# Patient Record
Sex: Female | Born: 1950 | Race: White | Hispanic: No | Marital: Married | Smoking: Former smoker
Health system: Southern US, Community
[De-identification: ages and names within clinical notes are randomized; demographics above are authoritative.]

## PROBLEM LIST (undated history)

## (undated) ENCOUNTER — Emergency Department (HOSPITAL_BASED_OUTPATIENT_CLINIC_OR_DEPARTMENT_OTHER): Admission: EM | Payer: Medicare Other

## (undated) DIAGNOSIS — K760 Fatty (change of) liver, not elsewhere classified: Secondary | ICD-10-CM

## (undated) DIAGNOSIS — M810 Age-related osteoporosis without current pathological fracture: Secondary | ICD-10-CM

## (undated) DIAGNOSIS — F32A Depression, unspecified: Secondary | ICD-10-CM

## (undated) DIAGNOSIS — R911 Solitary pulmonary nodule: Secondary | ICD-10-CM

## (undated) DIAGNOSIS — G4700 Insomnia, unspecified: Secondary | ICD-10-CM

## (undated) DIAGNOSIS — E041 Nontoxic single thyroid nodule: Secondary | ICD-10-CM

## (undated) HISTORY — PX: COLONOSCOPY: SHX174

## (undated) HISTORY — PX: ANKLE SURGERY: SHX546

## (undated) HISTORY — PX: BREAST BIOPSY: SHX20

---

## 2014-12-06 LAB — LIPID PANEL
CHOLESTEROL: 168 mg/dL (ref 0–200)
HDL: 83 mg/dL — AB (ref 35–70)
LDL CALC: 75 mg/dL
Triglycerides: 51 mg/dL (ref 40–160)

## 2015-03-07 LAB — BASIC METABOLIC PANEL
Glucose: 103 mg/dL
Potassium: 3.9 mmol/L (ref 3.4–5.3)
Sodium: 141 mmol/L (ref 137–147)

## 2015-03-07 LAB — HEPATIC FUNCTION PANEL
ALT: 18 U/L (ref 7–35)
AST: 21 U/L (ref 13–35)

## 2015-03-07 LAB — CBC AND DIFFERENTIAL
HEMATOCRIT: 41 % (ref 36–46)
Hemoglobin: 13.5 g/dL (ref 12.0–16.0)
PLATELETS: 257 10*3/uL (ref 150–399)
WBC: 5.6 10^3/mL

## 2015-05-15 LAB — LIPID PANEL
CHOLESTEROL: 212 mg/dL — AB (ref 0–200)
HDL: 82 mg/dL — AB (ref 35–70)
TRIGLYCERIDES: 45 mg/dL (ref 40–160)

## 2016-10-06 ENCOUNTER — Encounter: Payer: Self-pay | Admitting: Family Medicine

## 2016-10-06 ENCOUNTER — Other Ambulatory Visit (HOSPITAL_COMMUNITY)
Admission: RE | Admit: 2016-10-06 | Discharge: 2016-10-06 | Disposition: A | Discharge status: Home / Self Care | Payer: Medicare Other | Source: Ambulatory Visit | Attending: Family Medicine | Admitting: Family Medicine

## 2016-10-06 ENCOUNTER — Ambulatory Visit (INDEPENDENT_AMBULATORY_CARE_PROVIDER_SITE_OTHER): Payer: Medicare Other | Admitting: Family Medicine

## 2016-10-06 VITALS — BP 116/72 | HR 73 | Temp 98.4°F | Ht 63.25 in | Wt 119.8 lb

## 2016-10-06 DIAGNOSIS — N760 Acute vaginitis: Secondary | ICD-10-CM | POA: Diagnosis not present

## 2016-10-06 DIAGNOSIS — Z136 Encounter for screening for cardiovascular disorders: Secondary | ICD-10-CM | POA: Insufficient documentation

## 2016-10-06 DIAGNOSIS — Z0000 Encounter for general adult medical examination without abnormal findings: Secondary | ICD-10-CM | POA: Diagnosis not present

## 2016-10-06 DIAGNOSIS — Z13220 Encounter for screening for lipoid disorders: Secondary | ICD-10-CM | POA: Diagnosis not present

## 2016-10-06 DIAGNOSIS — Z124 Encounter for screening for malignant neoplasm of cervix: Secondary | ICD-10-CM | POA: Diagnosis not present

## 2016-10-06 MED ORDER — FLUCONAZOLE 150 MG PO TABS: 150.0000 mg | ORAL_TABLET | Freq: Once | ORAL | 0 refills | Status: AC

## 2016-10-06 MED ORDER — METRONIDAZOLE 500 MG PO TABS: 500.0000 mg | ORAL_TABLET | Freq: Two times a day (BID) | ORAL | 0 refills | Status: DC

## 2016-10-06 MED ORDER — FLUCONAZOLE 150 MG PO TABS: 150.0000 mg | ORAL_TABLET | Freq: Once | ORAL | 0 refills | Status: DC

## 2016-10-06 MED ORDER — METRONIDAZOLE 500 MG PO TABS: 500.0000 mg | ORAL_TABLET | Freq: Two times a day (BID) | ORAL | 0 refills | Status: AC

## 2016-10-06 NOTE — Progress Notes (Addendum)
Evelyn Jordan is a 66 y.o. female is here to [facility].   History of Present Illness:   Water quality scientist, CMA, acting as scribe for Dr. Juleen China.  CC:  Patient comes in today to establish care.  States she believes she may have a yeast infection.  Would like to have Pap performed today as well.  Patient is taking no medications and states she is allergic to compazine.  Vaginal Discharge  The patient's primary symptoms include vaginal discharge. This is a new problem. The current episode started in the past 7 days. The problem has been gradually worsening. The patient is experiencing no pain. She is not pregnant. Pertinent negatives include no abdominal pain, back pain, chills, constipation, diarrhea, discolored urine, dysuria, fever, flank pain, frequency, headaches, hematuria, nausea, painful intercourse, rash, sore throat, urgency or vomiting. The vaginal discharge was clear and malodorous. There has been no bleeding. She has not been passing clots. She has not been passing tissue. She has tried nothing for the symptoms. She is sexually active.   Health Maintenance Due  Topic Date Due  . Hepatitis C Screening  [birth year]  . HIV Screening  08/05/1966  . TETANUS/TDAP  08/05/1970  . PAP SMEAR  08/05/1972  . DEXA SCAN  08/05/2016  . PNA vac Low Risk Adult (1 of 2 - PCV13) 08/05/2016  . MAMMOGRAM  08/13/2016   PMHx, SurgHx, SocialHx, Medications, and Allergies were reviewed in the Visit Navigator and updated as appropriate.  History reviewed. No pertinent past medical history. History reviewed. No pertinent surgical history. History reviewed. No pertinent family history. Social History  Substance Use Topics  . Smoking status: Former Research scientist (life sciences)  . Smokeless tobacco: Never Used     Comment: quit in 2011  . Alcohol use Yes     Comment: Rarely   Current Medications and Allergies:   Current Outpatient Prescriptions:  .  metroNIDAZOLE (FLAGYL) 500 MG tablet, Take 1 tablet (500 mg total)  by mouth 2 (two) times daily., Disp: 14 tablet, Rfl: 0  Allergies  Allergen Reactions  . Compazine [Prochlorperazine Edisylate]     Neck hyperextends   Review of Systems:   Review of Systems  Constitutional: Negative for chills and fever.  HENT: Negative for congestion, ear pain and sore throat.   Eyes: Negative for blurred vision.  Respiratory: Negative for cough.   Cardiovascular: Negative for chest pain and palpitations.  Gastrointestinal: Negative for abdominal pain, constipation, diarrhea, nausea and vomiting.  Genitourinary: Positive for vaginal discharge. Negative for dysuria, flank pain, frequency, hematuria and urgency.  Musculoskeletal: Negative for back pain and neck pain.  Skin: Negative for rash.  Neurological: Negative for loss of consciousness and headaches.  Psychiatric/Behavioral: Negative for depression. The patient is not nervous/anxious.    Vitals:   Vitals:   10/06/16 1438  BP: 116/72  Pulse: 73  Temp: 98.4 F (36.9 C)  TempSrc: Oral  SpO2: 96%  Weight: 119 lb 12.8 oz (54.3 kg)  Height: 5' 3.25" (1.607 m)     Body mass index is 21.05 kg/m.  Physical Exam:   Physical Exam  Constitutional: She is oriented to person, place, and time. She appears well-developed and well-nourished. No distress.  HENT:  Head: Normocephalic and atraumatic.  Right Ear: External ear normal.  Left Ear: External ear normal.  Nose: Nose normal.  Mouth/Throat: Oropharynx is clear and moist.  Eyes: EOM are normal. Pupils are equal, round, and reactive to light.  Neck: Normal range of motion. Neck supple.  Cardiovascular: Normal rate, regular rhythm, normal heart sounds and intact distal pulses.   Pulmonary/Chest: Effort normal.  Abdominal: Soft. Normal appearance and bowel sounds are normal. There is no hepatosplenomegaly, splenomegaly or hepatomegaly. No hernia. Hernia confirmed negative in the right inguinal area and confirmed negative in the left inguinal area.    Genitourinary: Rectal exam shows no external hemorrhoid. There is no rash, tenderness or lesion on the right labia. There is no rash, tenderness or lesion on the left labia. Uterus is not deviated and not enlarged. Cervix exhibits no motion tenderness, no discharge and no friability. Right adnexum displays no mass. Left adnexum displays no mass. No erythema or bleeding in the vagina. Vaginal discharge found.  Musculoskeletal: Normal range of motion.  Neurological: She is alert and oriented to person, place, and time.  Skin: Skin is warm. Capillary refill takes less than 2 seconds.  Psychiatric: She has a normal mood and affect. Her behavior is normal.  Nursing note and vitals reviewed.  Assessment and Plan:   Evelyn Jordan was seen today for establish care and yeast infection.  Diagnoses and all orders for this visit:  Encounter for annual physical exam -     Comprehensive metabolic panel; Future  Acute vaginitis -     fluconazole (DIFLUCAN) 150 MG tablet; Take 1 tablet (150 mg total) by mouth once. -     metroNIDAZOLE (FLAGYL) 500 MG tablet; Take 1 tablet (500 mg total) by mouth 2 (two) times daily.  Screening for cervical cancer -     Cytology - PAP  Screening for lipid disorders  Encounter for screening for cardiovascular disorders -     Lipid panel; Future   . Reviewed expectations re: course of current medical issues. . Discussed self-management of symptoms. . Outlined signs and symptoms indicating need for more acute intervention. . Patient verbalized understanding and all questions were answered. . See orders for this visit as documented in the electronic medical record. . Patient received an After Visit Summary.  CMA served as Education administrator during this visit. History, Physical, and Plan performed by medical provider. Documentation and orders reviewed and attested to. Briscoe Deutscher, D.O.  Briscoe Deutscher, [place], [facility] 10/07/2016  Follow-up: No Follow-up on  file.

## 2016-10-07 ENCOUNTER — Encounter: Payer: Self-pay | Admitting: Family Medicine

## 2016-10-08 NOTE — Addendum Note (Signed)
Addended by: Briscoe Deutscher R on: 10/08/2016 09:24 AM   Modules accepted: Level of Service

## 2016-10-08 NOTE — Progress Notes (Signed)
Thanks for catching this. I added the documentation. Changed to NP3.

## 2016-10-09 LAB — CYTOLOGY - PAP
Bacterial vaginitis: NEGATIVE
Candida vaginitis: NEGATIVE
Diagnosis: NEGATIVE
HPV: NOT DETECTED

## 2016-10-17 ENCOUNTER — Ambulatory Visit (INDEPENDENT_AMBULATORY_CARE_PROVIDER_SITE_OTHER): Payer: Medicare Other

## 2016-10-17 ENCOUNTER — Encounter: Payer: Self-pay | Admitting: Family Medicine

## 2016-10-17 ENCOUNTER — Ambulatory Visit (INDEPENDENT_AMBULATORY_CARE_PROVIDER_SITE_OTHER): Payer: Medicare Other | Admitting: Family Medicine

## 2016-10-17 ENCOUNTER — Telehealth: Payer: Self-pay | Admitting: Family Medicine

## 2016-10-17 ENCOUNTER — Other Ambulatory Visit (INDEPENDENT_AMBULATORY_CARE_PROVIDER_SITE_OTHER): Payer: Medicare Other

## 2016-10-17 VITALS — BP 124/76 | HR 67 | Temp 98.1°F | Ht 63.25 in | Wt 122.4 lb

## 2016-10-17 DIAGNOSIS — Z0000 Encounter for general adult medical examination without abnormal findings: Secondary | ICD-10-CM

## 2016-10-17 DIAGNOSIS — Z136 Encounter for screening for cardiovascular disorders: Secondary | ICD-10-CM | POA: Diagnosis not present

## 2016-10-17 DIAGNOSIS — M25562 Pain in left knee: Secondary | ICD-10-CM

## 2016-10-17 DIAGNOSIS — M25462 Effusion, left knee: Secondary | ICD-10-CM | POA: Diagnosis not present

## 2016-10-17 LAB — COMPREHENSIVE METABOLIC PANEL
ALT: 20 U/L (ref 0–35)
AST: 21 U/L (ref 0–37)
Albumin: 3.9 g/dL (ref 3.5–5.2)
Alkaline Phosphatase: 43 U/L (ref 39–117)
BUN: 17 mg/dL (ref 6–23)
CO2: 27 mEq/L (ref 19–32)
Calcium: 9 mg/dL (ref 8.4–10.5)
Chloride: 108 mEq/L (ref 96–112)
Creatinine, Ser: 0.89 mg/dL (ref 0.40–1.20)
GFR: 67.61 mL/min (ref >60.00)
Glucose, Bld: 114 mg/dL — ABNORMAL HIGH (ref 70–99)
Potassium: 3.9 mEq/L (ref 3.5–5.1)
Sodium: 141 mEq/L (ref 135–145)
Total Bilirubin: 0.5 mg/dL (ref 0.2–1.2)
Total Protein: 6.4 g/dL (ref 6.0–8.3)

## 2016-10-17 LAB — LIPID PANEL
Cholesterol: 196 mg/dL (ref 0–200)
HDL: 76 mg/dL (ref >39.00)
LDL Cholesterol: 106 mg/dL — ABNORMAL HIGH (ref 0–99)
NonHDL: 120.39
Total CHOL/HDL Ratio: 3
Triglycerides: 73 mg/dL (ref 0.0–149.0)
VLDL: 14.6 mg/dL (ref 0.0–40.0)

## 2016-10-17 NOTE — Progress Notes (Signed)
   Evelyn Jordan is a 66 y.o. female here for an acute visit.  History of Present Illness:   [name] CMA acting as scribe for Dr. Juleen China.   Knee Pain   The incident occurred more than 1 week ago. The incident occurred at the gym. There was no injury mechanism. The pain is present in the left knee. The quality of the pain is described as aching. The pain is mild. The pain has been intermittent since onset. Pertinent negatives include no inability to bear weight, loss of motion, loss of sensation, muscle weakness, numbness or tingling. The symptoms are aggravated by palpation. She has tried nothing for the symptoms.   PMHx, SurgHx, SocialHx, Medications, and Allergies were reviewed in the Visit Navigator and updated as appropriate.  Current Medications:  No current outpatient prescriptions on file.  Allergies  Allergen Reactions  . Compazine [Prochlorperazine Edisylate]     Neck hyperextends   Review of Systems:   Review of Systems  Constitutional: Negative for chills and fever.  HENT: Negative for congestion, ear pain, sinus pain and sore throat.   Eyes: Negative for blurred vision and double vision.  Respiratory: Negative for cough, shortness of breath and wheezing.   Cardiovascular: Negative for chest pain, palpitations and leg swelling.  Gastrointestinal: Negative for constipation, diarrhea, nausea and vomiting.  Genitourinary: Negative for dysuria.  Musculoskeletal: Positive for joint pain. Negative for back pain and neck pain.       Left knee.   Neurological: Negative for dizziness, tingling, weakness, numbness and headaches.  Psychiatric/Behavioral: Negative for depression, hallucinations and memory loss.    Vitals:   Vitals:   10/17/16 1034  BP: 124/76  Pulse: 67  Temp: 98.1 F (36.7 C)  TempSrc: Oral  SpO2: 96%  Weight: 122 lb 6.4 oz (55.5 kg)  Height: 5' 3.25" (1.607 m)     Body mass index is 21.51 kg/m.  Physical Exam:   Physical Exam    Constitutional: She appears well-nourished.  HENT:  Head: Normocephalic and atraumatic.  Eyes: EOM are normal. Pupils are equal, round, and reactive to light.  Neck: Normal range of motion. Neck supple.  Cardiovascular: Normal rate, regular rhythm, normal heart sounds and intact distal pulses.   Pulmonary/Chest: Effort normal.  Abdominal: Soft.  Musculoskeletal:       Legs: Skin: Skin is warm.  Psychiatric: She has a normal mood and affect. Her behavior is normal.  Nursing note and vitals reviewed.   Assessment and Plan:    [name] was seen today for knee pain.  Diagnoses and all orders for this visit:  Acute pain of left knee Comments: c/w tendonitis. RICE. To [facility] if not improving.  Orders: -     DG Knee AP/LAT W/Sunrise Left; Future    . Reviewed expectations re: course of current medical issues. . Discussed self-management of symptoms. . Outlined signs and symptoms indicating need for more acute intervention. . Patient verbalized understanding and all questions were answered. . See orders for this visit as documented in the electronic medical record. . Patient received an After-Visit Summary.  CMA served as Education administrator during this visit. History, Physical, and Plan performed by medical provider. Documentation and orders reviewed and attested to. Briscoe Deutscher, D.O.  Briscoe Deutscher, DO Family [facility] [place], [facility] 10/17/2016   No future appointments.

## 2016-10-17 NOTE — Telephone Encounter (Signed)
Patient called in stating LFT knee pain. Wanted to be seen ASAP.   Scheduled with Briscoe Deutscher this morning at 10:30am

## 2016-11-24 ENCOUNTER — Encounter: Payer: Self-pay | Admitting: Family Medicine

## 2016-11-24 ENCOUNTER — Telehealth: Payer: Self-pay | Admitting: Family Medicine

## 2016-11-24 ENCOUNTER — Ambulatory Visit (INDEPENDENT_AMBULATORY_CARE_PROVIDER_SITE_OTHER): Payer: Medicare Other | Admitting: Family Medicine

## 2016-11-24 VITALS — BP 128/76 | HR 72 | Temp 98.5°F | Ht 63.25 in | Wt 121.6 lb

## 2016-11-24 DIAGNOSIS — L989 Disorder of the skin and subcutaneous tissue, unspecified: Secondary | ICD-10-CM | POA: Diagnosis not present

## 2016-11-24 MED ORDER — DOXYCYCLINE HYCLATE 100 MG PO TABS: 100.0000 mg | ORAL_TABLET | Freq: Two times a day (BID) | ORAL | 0 refills | Status: DC

## 2016-11-24 MED ORDER — MUPIROCIN 2 % EX OINT: 1.0000 "application " | TOPICAL_OINTMENT | Freq: Two times a day (BID) | CUTANEOUS | 0 refills | Status: DC

## 2016-11-24 NOTE — Telephone Encounter (Signed)
error 

## 2016-11-24 NOTE — Progress Notes (Signed)
Evelyn Jordan is a 66 y.o. female here for an acute visit.  History of Present Illness:   Evelyn Jordan CMA acting as scribe for Dr. Juleen Jordan.  Rash  This is a new problem. The current episode started in the past 7 days. The problem is unchanged. The affected locations include the back. The rash is characterized by redness and itchiness. She was exposed to nothing. Pertinent negatives include no congestion, cough, diarrhea, fatigue, fever, joint pain, shortness of breath, sore throat or vomiting. Past treatments include nothing.   PMHx, SurgHx, SocialHx, Medications, and Allergies were reviewed in the Visit Navigator and updated as appropriate.  Current Medications:   Current Outpatient Prescriptions:  .  doxycycline (VIBRA-TABS) 100 MG tablet, Take 1 tablet (100 mg total) by mouth 2 (two) times daily., Disp: 20 tablet, Rfl: 0 .  mupirocin ointment (BACTROBAN) 2 %, Place 1 application into the nose 2 (two) times daily., Disp: 22 g, Rfl: 0  Allergies  Allergen Reactions  . Compazine [Prochlorperazine Edisylate]     Neck hyperextends   Review of Systems:   Review of Systems  Constitutional: Negative for chills, fatigue, fever and malaise/fatigue.  HENT: Negative for congestion, ear pain, sinus pain and sore throat.   Eyes: Negative for blurred vision and double vision.  Respiratory: Negative for cough, shortness of breath and wheezing.   Cardiovascular: Negative for chest pain, palpitations and leg swelling.  Gastrointestinal: Negative for abdominal pain, diarrhea, nausea and vomiting.  Musculoskeletal: Negative for back pain, joint pain and neck pain.  Skin: Positive for rash.  Neurological: Negative for dizziness and headaches.  Psychiatric/Behavioral: Negative for depression, hallucinations and memory loss.   Vitals:   Vitals:   11/24/16 1501  BP: 128/76  Pulse: 72  Temp: 98.5 F (36.9 C)  TempSrc: Oral  SpO2: 96%  Weight: 121 lb 9.6 oz (55.2 kg)  Height: 5' 3.25"  (1.607 m)     Body mass index is 21.37 kg/m.  Physical Exam:   Physical Exam  Constitutional: She appears well-developed and well-nourished. No distress.  HENT:  Head: Normocephalic and atraumatic.  Eyes: EOM are normal. Pupils are equal, round, and reactive to light.  Neck: Normal range of motion. Neck supple.  Cardiovascular: Normal rate and regular rhythm.   Pulmonary/Chest: Effort normal.  Abdominal: Soft.  Musculoskeletal:       Back:  1-2 mm diameter area, irritated, with central black scab. No surrounding erythema, induration, or streaking.   Skin: Skin is warm.  Psychiatric: She has a normal mood and affect. Her behavior is normal.  Nursing note and vitals reviewed.   Results for orders placed or performed in visit on 10/17/16  Comprehensive metabolic panel  Result Value Ref Range   Sodium 141 135 - 145 mEq/L   Potassium 3.9 3.5 - 5.1 mEq/L   Chloride 108 96 - 112 mEq/L   CO2 27 19 - 32 mEq/L   Glucose, Bld 114 (H) 70 - 99 mg/dL   BUN 17 6 - 23 mg/dL   Creatinine, Ser 0.89 0.40 - 1.20 mg/dL   Total Bilirubin 0.5 0.2 - 1.2 mg/dL   Alkaline Phosphatase 43 39 - 117 U/L   AST 21 0 - 37 U/L   ALT 20 0 - 35 U/L   Total Protein 6.4 6.0 - 8.3 g/dL   Albumin 3.9 3.5 - 5.2 g/dL   Calcium 9.0 8.4 - 10.5 mg/dL   GFR 67.61 >60.00 mL/min  Lipid panel  Result Value Ref Range  Cholesterol 196 0 - 200 mg/dL   Triglycerides 73.0 0.0 - 149.0 mg/dL   HDL 76.00 >39.00 mg/dL   VLDL 14.6 0.0 - 40.0 mg/dL   LDL Cholesterol 106 (H) 0 - 99 mg/dL   Total CHOL/HDL Ratio 3    NonHDL 120.39     Assessment and Plan:   Evelyn Jordan was seen today for mass.  Diagnoses and all orders for this visit:  Skin lesion of back Comments: Most c/w tick or bug bite. No red flags. Advised patient to try mupirocin first. If worsens or signs of RMSF, to start Doxycycline. Orders: -     doxycycline (VIBRA-TABS) 100 MG tablet; Take 1 tablet (100 mg total) by mouth 2 (two) times daily. -      mupirocin ointment (BACTROBAN) 2 %; Place 1 application into the area BID prn   . Reviewed expectations re: course of current medical issues. . Discussed self-management of symptoms. . Outlined signs and symptoms indicating need for more acute intervention. . Patient verbalized understanding and all questions were answered. Marland Kitchen Health Maintenance issues including appropriate healthy diet, exercise, and smoking avoidance were discussed with patient. . See orders for this visit as documented in the electronic medical record. . Patient received an After Visit Summary.  CMA served as Education administrator during this visit. History, Physical, and Plan performed by medical provider. The above documentation has been reviewed and is accurate and complete. Evelyn Jordan, D.O.  Evelyn Deutscher, DO [place], [facility] 11/25/2016  No future appointments.

## 2017-03-05 DIAGNOSIS — H5213 Myopia, bilateral: Secondary | ICD-10-CM | POA: Diagnosis not present

## 2017-03-05 DIAGNOSIS — H40013 Open angle with borderline findings, low risk, bilateral: Secondary | ICD-10-CM | POA: Diagnosis not present

## 2017-03-13 ENCOUNTER — Telehealth: Payer: Self-pay | Admitting: Family Medicine

## 2017-03-13 NOTE — Telephone Encounter (Signed)
Left message for patient to call Ebony Hail back at [phone] and scheduled Welcome to Medicare exam with PCP.  Note**Can have before 07/10/17 Thanks!

## 2017-04-09 ENCOUNTER — Ambulatory Visit: Payer: Medicare Other | Admitting: Family Medicine

## 2017-04-09 DIAGNOSIS — H1045 Other chronic allergic conjunctivitis: Secondary | ICD-10-CM | POA: Diagnosis not present

## 2017-04-21 ENCOUNTER — Encounter: Payer: Self-pay | Admitting: Family Medicine

## 2017-04-21 ENCOUNTER — Telehealth: Payer: Self-pay | Admitting: Family Medicine

## 2017-04-21 ENCOUNTER — Ambulatory Visit (INDEPENDENT_AMBULATORY_CARE_PROVIDER_SITE_OTHER): Payer: Medicare Other | Admitting: Family Medicine

## 2017-04-21 VITALS — BP 148/86 | HR 57 | Temp 98.4°F | Ht 63.25 in | Wt 124.4 lb

## 2017-04-21 DIAGNOSIS — R011 Cardiac murmur, unspecified: Secondary | ICD-10-CM | POA: Diagnosis not present

## 2017-04-21 DIAGNOSIS — R5383 Other fatigue: Secondary | ICD-10-CM

## 2017-04-21 DIAGNOSIS — R42 Dizziness and giddiness: Secondary | ICD-10-CM | POA: Diagnosis not present

## 2017-04-21 LAB — CBC WITH DIFFERENTIAL/PLATELET
Basophils Absolute: 0 10*3/uL (ref 0.0–0.1)
Basophils Relative: 0.5 % (ref 0.0–3.0)
Eosinophils Absolute: 0 10*3/uL (ref 0.0–0.7)
Eosinophils Relative: 0.6 % (ref 0.0–5.0)
HCT: 42.7 % (ref 36.0–46.0)
Hemoglobin: 14.2 g/dL (ref 12.0–15.0)
Lymphocytes Relative: 26.2 % (ref 12.0–46.0)
Lymphs Abs: 1.2 10*3/uL (ref 0.7–4.0)
MCHC: 33.3 g/dL (ref 30.0–36.0)
MCV: 94.3 fl (ref 78.0–100.0)
Monocytes Absolute: 0.4 10*3/uL (ref 0.1–1.0)
Monocytes Relative: 9.6 % (ref 3.0–12.0)
Neutro Abs: 2.9 10*3/uL (ref 1.4–7.7)
Neutrophils Relative %: 63.1 % (ref 43.0–77.0)
Platelets: 245 10*3/uL (ref 150.0–400.0)
RBC: 4.53 Mil/uL (ref 3.87–5.11)
RDW: 13.3 % (ref 11.5–15.5)
WBC: 4.6 10*3/uL (ref 4.0–10.5)

## 2017-04-21 LAB — COMPREHENSIVE METABOLIC PANEL
ALT: 18 U/L (ref 0–35)
AST: 18 U/L (ref 0–37)
Albumin: 4.2 g/dL (ref 3.5–5.2)
Alkaline Phosphatase: 46 U/L (ref 39–117)
BUN: 15 mg/dL (ref 6–23)
CO2: 29 mEq/L (ref 19–32)
Calcium: 9.6 mg/dL (ref 8.4–10.5)
Chloride: 103 mEq/L (ref 96–112)
Creatinine, Ser: 0.79 mg/dL (ref 0.40–1.20)
GFR: 77.46 mL/min (ref >60.00)
Glucose, Bld: 115 mg/dL — ABNORMAL HIGH (ref 70–99)
Potassium: 3.7 mEq/L (ref 3.5–5.1)
Sodium: 139 mEq/L (ref 135–145)
Total Bilirubin: 0.5 mg/dL (ref 0.2–1.2)
Total Protein: 7.1 g/dL (ref 6.0–8.3)

## 2017-04-21 LAB — TSH: TSH: 1.6 u[IU]/mL (ref 0.35–4.50)

## 2017-04-21 NOTE — Telephone Encounter (Signed)
Patient needs to know where her order has been sent, and which office will be giving her a call. Please call patient this week to advise per her request.

## 2017-04-21 NOTE — Progress Notes (Addendum)
Evelyn Jordan is a 66 y.o. female is here for follow up.  History of Present Illness:   HPI:   Patient presents for evaluation of dizziness. The symptoms started several days ago and have essentially resolved. The patient states that she was at a [employer]. She awoke and felt very dizzy (room spinning sensation), associated with nausea and difficulty walking due to balance issue. It lasted until mid-afternoon. She denies other neurological symptoms - paresthesias, focal deficits, trouble speaking or swallowing. This has never happened to her before. She did not fly to the conference. Review of Systems  Constitutional: Negative for chills, fever and malaise/fatigue.  Eyes: Negative for double vision.  Respiratory: Negative for cough.   Cardiovascular: Negative for chest pain, palpitations and leg swelling.  Gastrointestinal: Negative for abdominal pain, constipation, diarrhea, nausea and vomiting.  Genitourinary: Negative for dysuria.  Musculoskeletal: Negative for falls.  Skin: Negative for rash.  Neurological: Negative for tremors, focal weakness, seizures, loss of consciousness, weakness and headaches.  Psychiatric/Behavioral: Negative for memory loss and substance abuse.   Previous workup/treatments: none. Associated ear symptoms: none. Associated CNS symptoms: none. Recent infections: none. Head trauma: denied. Drug ingestion: none. Noise exposure: no occupational exposure.   Health Maintenance Due  Topic Date Due  . Hepatitis C Screening  04-Feb-1951  . HIV Screening  08/05/1966  . TETANUS/TDAP  08/05/1970  . DEXA SCAN  08/05/2016  . PNA vac Low Risk Adult (1 of 2 - PCV13) 08/05/2016  . MAMMOGRAM  08/13/2016  . INFLUENZA VACCINE  01/07/2017   Depression screen PHQ 2/9 10/06/2016  Decreased Interest 0  Down, Depressed, Hopeless 0  PHQ - 2 Score 0   PMHx, SurgHx, SocialHx, FamHx, Medications, and Allergies were reviewed in the Visit Navigator and updated as  appropriate.  There are no active problems to display for this patient.  Social History   Tobacco Use  . Smoking status: Former Research scientist (life sciences)  . Smokeless tobacco: Never Used  . Tobacco comment: quit in 2011  Substance Use Topics  . Alcohol use: Yes    Comment: Rarely  . Drug use: No   Current Medications and Allergies:  No current outpatient medications on file.   Allergies  Allergen Reactions  . Compazine [Prochlorperazine Edisylate]     Neck hyperextends   Review of Systems   Pertinent items are noted in the HPI. Otherwise, ROS is negative.  Vitals:   Vitals:   04/21/17 1143  BP: (!) 148/86  Pulse: (!) 57  Temp: 98.4 F (36.9 C)  TempSrc: Oral  SpO2: 98%  Weight: 124 lb 6.4 oz (56.4 kg)  Height: 5' 3.25" (1.607 m)     Body mass index is 21.86 kg/m.   Physical Exam:   Physical Exam  Constitutional: She is oriented to person, place, and time. She appears well-developed and well-nourished. No distress.  HENT:  Head: Normocephalic and atraumatic.  Right Ear: External ear normal.  Left Ear: External ear normal.  Nose: Nose normal.  Mouth/Throat: Oropharynx is clear and moist.  Eyes: Conjunctivae and EOM are normal. Pupils are equal, round, and reactive to light.  Neck: Normal range of motion. Neck supple. No thyromegaly present.  Cardiovascular: Normal rate, regular rhythm, normal heart sounds and intact distal pulses.  Pulmonary/Chest: Effort normal and breath sounds normal.  Abdominal: Soft. Bowel sounds are normal.  Musculoskeletal: Normal range of motion.  Lymphadenopathy:    She has no cervical adenopathy.  Neurological: She is alert and oriented to person, place,  and time.  Skin: Skin is warm and dry. Capillary refill takes less than 2 seconds.  Psychiatric: She has a normal mood and affect. Her behavior is normal.  Nursing note and vitals reviewed.   Results for orders placed or performed in visit on 04/21/17  CBC with Differential/Platelet  Result  Value Ref Range   WBC 4.6 4.0 - 10.5 K/uL   RBC 4.53 3.87 - 5.11 Mil/uL   Hemoglobin 14.2 12.0 - 15.0 g/dL   HCT 42.7 36.0 - 46.0 %   MCV 94.3 78.0 - 100.0 fl   MCHC 33.3 30.0 - 36.0 g/dL   RDW 13.3 11.5 - 15.5 %   Platelets 245.0 150.0 - 400.0 K/uL   Neutrophils Relative % 63.1 43.0 - 77.0 %   Lymphocytes Relative 26.2 12.0 - 46.0 %   Monocytes Relative 9.6 3.0 - 12.0 %   Eosinophils Relative 0.6 0.0 - 5.0 %   Basophils Relative 0.5 0.0 - 3.0 %   Neutro Abs 2.9 1.4 - 7.7 K/uL   Lymphs Abs 1.2 0.7 - 4.0 K/uL   Monocytes Absolute 0.4 0.1 - 1.0 K/uL   Eosinophils Absolute 0.0 0.0 - 0.7 K/uL   Basophils Absolute 0.0 0.0 - 0.1 K/uL  Comprehensive metabolic panel  Result Value Ref Range   Sodium 139 135 - 145 mEq/L   Potassium 3.7 3.5 - 5.1 mEq/L   Chloride 103 96 - 112 mEq/L   CO2 29 19 - 32 mEq/L   Glucose, Bld 115 (H) 70 - 99 mg/dL   BUN 15 6 - 23 mg/dL   Creatinine, Ser 0.79 0.40 - 1.20 mg/dL   Total Bilirubin 0.5 0.2 - 1.2 mg/dL   Alkaline Phosphatase 46 39 - 117 U/L   AST 18 0 - 37 U/L   ALT 18 0 - 35 U/L   Total Protein 7.1 6.0 - 8.3 g/dL   Albumin 4.2 3.5 - 5.2 g/dL   Calcium 9.6 8.4 - 10.5 mg/dL   GFR 77.46 >60.00 mL/min  TSH  Result Value Ref Range   TSH 1.60 0.35 - 4.50 uIU/mL   04/23/17 ECHO:  Study Conclusions  - Left ventricle: The cavity size was normal. Wall thickness was   normal. Systolic function was normal. The estimated ejection   fraction was in the range of 60% to 65%. Wall motion was normal;   there were no regional wall motion abnormalities. Doppler   parameters are consistent with abnormal left ventricular   relaxation (grade 1 diastolic dysfunction). - Aortic valve: Trileaflet; mildly thickened, mildly calcified   leaflets. - Mitral valve: There was trivial regurgitation. - Right atrium: The atrium was mildly dilated.  Assessment and Plan:   Chenelle was seen today for dizziness.  Diagnoses and all orders for this  visit:  Dizziness Comments: Likely vertigo, but severe episode that lasted several hours. Labs and ECHO reassuring. Carotid dopplers pending.  Orders: -     Cancel: US Carotid Duplex Bilateral; Future -     CBC with Differential/Platelet -     Comprehensive metabolic panel -     TSH -     VAS US CAROTID; Future  Orthostatic lightheadedness Comments: History of this, but worsening. With above issue, ECHO ordered. Mild calcification of Aortic valve.  Orders: -     ECHOCARDIOGRAM COMPLETE; Future  Murmur, cardiac -     ECHOCARDIOGRAM COMPLETE; Future  Fatigue, unspecified type -     CBC with Differential/Platelet -     Comprehensive metabolic panel -  TSH    . Reviewed expectations re: course of current medical issues. . Discussed self-management of symptoms. . Outlined signs and symptoms indicating need for more acute intervention. . Patient verbalized understanding and all questions were answered. Marland Kitchen Health Maintenance issues including appropriate healthy diet, exercise, and smoking avoidance were discussed with patient. . See orders for this visit as documented in the electronic medical record. . Patient received an After Visit Summary.  Briscoe Deutscher, DO [place], [facility] [place] 04/26/2017  Future Appointments  Date Time Provider [place]  05/19/2017 11:00 AM [facility] 2 [facility]

## 2017-04-21 NOTE — Telephone Encounter (Signed)
Lanny Hurst can you please let the patient know. Dr. Juleen China ordered two test today.

## 2017-04-23 ENCOUNTER — Ambulatory Visit (HOSPITAL_COMMUNITY): Payer: Medicare Other | Attending: Cardiology

## 2017-04-23 ENCOUNTER — Other Ambulatory Visit: Payer: Self-pay

## 2017-04-23 DIAGNOSIS — R011 Cardiac murmur, unspecified: Secondary | ICD-10-CM | POA: Diagnosis not present

## 2017-04-23 DIAGNOSIS — I517 Cardiomegaly: Secondary | ICD-10-CM | POA: Insufficient documentation

## 2017-04-23 DIAGNOSIS — I358 Other nonrheumatic aortic valve disorders: Secondary | ICD-10-CM | POA: Diagnosis not present

## 2017-04-23 DIAGNOSIS — Z8249 Family history of ischemic heart disease and other diseases of the circulatory system: Secondary | ICD-10-CM | POA: Diagnosis not present

## 2017-04-23 DIAGNOSIS — Z87891 Personal history of nicotine dependence: Secondary | ICD-10-CM | POA: Diagnosis not present

## 2017-04-23 DIAGNOSIS — R42 Dizziness and giddiness: Secondary | ICD-10-CM

## 2017-04-24 DIAGNOSIS — Z23 Encounter for immunization: Secondary | ICD-10-CM | POA: Diagnosis not present

## 2017-04-26 ENCOUNTER — Encounter: Payer: Self-pay | Admitting: Family Medicine

## 2017-05-05 NOTE — Telephone Encounter (Signed)
Spoke to pt. She has been seen several time and declines Welcome Exam

## 2017-05-19 ENCOUNTER — Inpatient Hospital Stay (HOSPITAL_COMMUNITY)
Admission: RE | Admit: 2017-05-19 | Payer: Medicare Other | Source: Ambulatory Visit | Attending: Family Medicine | Admitting: Family Medicine

## 2017-05-22 ENCOUNTER — Encounter (HOSPITAL_COMMUNITY): Payer: Medicare Other

## 2017-05-26 ENCOUNTER — Ambulatory Visit (HOSPITAL_COMMUNITY)
Admission: RE | Admit: 2017-05-26 | Discharge: 2017-05-26 | Disposition: A | Discharge status: Home / Self Care | Payer: Medicare Other | Source: Ambulatory Visit | Attending: Cardiovascular Disease | Admitting: Cardiovascular Disease

## 2017-05-26 DIAGNOSIS — Z87891 Personal history of nicotine dependence: Secondary | ICD-10-CM | POA: Insufficient documentation

## 2017-05-26 DIAGNOSIS — R42 Dizziness and giddiness: Secondary | ICD-10-CM

## 2017-05-29 DIAGNOSIS — H40013 Open angle with borderline findings, low risk, bilateral: Secondary | ICD-10-CM | POA: Diagnosis not present

## 2017-12-01 NOTE — Progress Notes (Signed)
PCP notes:   Health maintenance: Hep C screening: Will get with next blood draw.  Tdap: Discussed insurance.  Dexa: Order placed, patient will schedule through [employer]. PCV13: Received today.  Mammogram:  Order placed, patient will schedule through [employer].   Abnormal screenings: None.    Patient concerns: Patient states that she does not sleep well, she is used to her husband being in bed with her. She has tried Melatonin, white noise machine,  And lavender. Appointment made with Dr Juleen China to follow up.    Nurse concerns: None.   Next PCP appt: 12/04/17

## 2017-12-01 NOTE — Progress Notes (Signed)
Subjective:   Evelyn Jordan is a 67 y.o. female who presents for an Initial Medicare Annual Wellness Visit.  Lives alone in single family two story home.   Review of Systems    No ROS.  Medicare Wellness Visit. Additional risk factors are reflected in the social history.  Sleeps is difficult. Wakes up intermittently throughout night. Occasionally feels tired throughout day. Tries to avoid naps.   Cardiac Risk Factors include: advanced age (>61men, >50 women)     Objective:    Today's Vitals   12/02/17 1130  BP: 120/70  Pulse: (!) 59  Resp: 16  SpO2: 98%  Weight: 122 lb 6.4 oz (55.5 kg)  Height: 5\' 3"  (1.6 m)   Body mass index is 21.68 kg/m.  Advanced Directives 12/02/2017  Does Patient Have a Medical Advance Directive? No  Would patient like information on creating a medical advance directive? Yes (MAU/Ambulatory/Procedural Areas - Information given)    Current Medications (verified) No outpatient encounter medications on file as of 12/02/2017.   No facility-administered encounter medications on file as of 12/02/2017.     Allergies (verified) Compazine [prochlorperazine edisylate]   History: History reviewed. No pertinent past medical history. History reviewed. No pertinent surgical history. Family History  Problem Relation Age of Onset  . Alzheimer's disease Mother   . Hypertension Father   . Stroke Father    Social History   Socioeconomic History  . Marital status: Married    Spouse name: Not on file  . Number of children: Not on file  . Years of education: Not on file  . Highest education level: Not on file  Occupational History    Comment: retired Marine scientist   Social Needs  . Financial resource strain: Not on file  . Food insecurity:    Worry: Not on file    Inability: Not on file  . Transportation needs:    Medical: Not on file    Non-medical: Not on file  Tobacco Use  . Smoking status: Former Research scientist (life sciences)  . Smokeless tobacco: Never Used  .  Tobacco comment: quit in 2011  Substance and Sexual Activity  . Alcohol use: Yes    Comment: Rarely  . Drug use: No  . Sexual activity: Not on file  Lifestyle  . Physical activity:    Days per week: Not on file    Minutes per session: Not on file  . Stress: Not on file  Relationships  . Social connections:    Talks on phone: Not on file    Gets together: Not on file    Attends religious service: Not on file    Active member of club or organization: Not on file    Attends meetings of clubs or organizations: Not on file    Relationship status: Not on file  Other Topics Concern  . Not on file  Social History Narrative  . Not on file    Tobacco Counseling Counseling given: Not Answered Comment: quit in 2011   Activities of Daily Living In your present state of health, do you have any difficulty performing the following activities: 12/02/2017  Hearing? N  Vision? N  Difficulty concentrating or making decisions? N  Walking or climbing stairs? N  Dressing or bathing? N  Doing errands, shopping? N  Preparing Food and eating ? N  Using the Toilet? N  Comment Occasional constipation, relieved by Fiber one or Colace.   In the past six months, have you accidently leaked urine? N  Do you have problems with loss of bowel control? N  Managing your Medications? N  Managing your Finances? N  Housekeeping or managing your Housekeeping? N  Some recent data might be hidden     Immunizations and Health Maintenance Immunization History  Administered Date(s) Administered  . Influenza-Unspecified 04/24/2017  . Pneumococcal Conjugate-13 12/02/2017   Health Maintenance Due  Topic Date Due  . Hepatitis C Screening  03-Oct-1950  . TETANUS/TDAP  08/05/1970  . DEXA SCAN  08/05/2016  . PNA vac Low Risk Adult (1 of 2 - PCV13) 08/05/2016  . MAMMOGRAM  08/13/2016    Patient Care Team: Briscoe Deutscher, DO as PCP - General (Family Medicine)  Indicate any recent Medical Services you may  have received from other than [facility] providers in the past year (date may be approximate).     Assessment:   This is a routine wellness examination for Evelyn Jordan.  Hearing/Vision screen Hearing Screening Comments: Able to hear conversational tones w/o difficulty. No issues reported. Passed whisper test.  Vision Screening Comments: Yearly. [facility].  Dietary issues and exercise activities discussed: Current Exercise Habits: Home exercise routine, Type of exercise: yoga, Time (Minutes): 30, Frequency (Times/Week): 3, Weekly Exercise (Minutes/Week): 90, Intensity: Moderate, Exercise limited by: Other - see comments  Eats 2-3 meals/day. Drinks 32 oz water/day. 2 cans diet soda/day.  Breakfast: All bran, 1-2 cups coffee. Lunch: Ham and cheese sandwich. Dinner: Chicken caesar salad. Snacks: Fruits and vegetables.   Goals    . Maintain current health       Depression Screen PHQ 2/9 Scores 12/02/2017 10/06/2016  PHQ - 2 Score 0 0  PHQ- 9 Score 1 -    Fall Risk Fall Risk  12/02/2017 10/06/2016  Falls in the past year? No No    Cognitive Function: Ad8 score reviewed for issues:  Issues making decisions:0  Less interest in hobbies / activities:0  Repeats questions, stories (family complaining):0  Trouble using ordinary gadgets (microwave, computer, phone):0  Forgets the month or year: 0  Mismanaging finances: 0  Remembering appts:0  Daily problems with thinking and/or memory:0 Ad8 score is=0 Participates in word searches and reads daily.          Screening Tests Health Maintenance  Topic Date Due  . Hepatitis C Screening  Mar 06, 1951  . TETANUS/TDAP  08/05/1970  . DEXA SCAN  08/05/2016  . PNA vac Low Risk Adult (1 of 2 - PCV13) 08/05/2016  . MAMMOGRAM  08/13/2016  . INFLUENZA VACCINE  01/07/2018  . COLONOSCOPY  08/13/2024      Plan:   Follow up with PCP as directed.  I have personally reviewed and noted the following in the patient's chart:   . Medical  and social history . Use of alcohol, tobacco or illicit drugs  . Current medications and supplements . Functional ability and status . Nutritional status . Physical activity . Advanced directives . List of other physicians . Vitals . Screenings to include cognitive, depression, and falls . Referrals and appointments  In addition, I have reviewed and discussed with patient certain preventive protocols, quality metrics, and best practice recommendations. A written personalized care plan for preventive services as well as general preventive health recommendations were provided to patient.     Williemae Area, RN   12/02/2017

## 2017-12-02 ENCOUNTER — Encounter: Payer: Self-pay | Admitting: *Deleted

## 2017-12-02 ENCOUNTER — Ambulatory Visit (INDEPENDENT_AMBULATORY_CARE_PROVIDER_SITE_OTHER): Payer: Medicare Other | Admitting: *Deleted

## 2017-12-02 VITALS — BP 120/70 | HR 59 | Resp 16 | Ht 63.0 in | Wt 122.4 lb

## 2017-12-02 DIAGNOSIS — Z780 Asymptomatic menopausal state: Secondary | ICD-10-CM | POA: Diagnosis not present

## 2017-12-02 DIAGNOSIS — Z1231 Encounter for screening mammogram for malignant neoplasm of breast: Secondary | ICD-10-CM

## 2017-12-02 DIAGNOSIS — Z0000 Encounter for general adult medical examination without abnormal findings: Secondary | ICD-10-CM | POA: Diagnosis not present

## 2017-12-02 DIAGNOSIS — Z23 Encounter for immunization: Secondary | ICD-10-CM | POA: Diagnosis not present

## 2017-12-02 DIAGNOSIS — Z1239 Encounter for other screening for malignant neoplasm of breast: Secondary | ICD-10-CM

## 2017-12-02 NOTE — Patient Instructions (Addendum)
Evelyn Jordan ,   Thank you for taking time to come for your Medicare Wellness Visit. I appreciate your ongoing commitment to your health goals. Please review the following plan we discussed and let me know if I can assist you in the future.   These are the goals we discussed: Goals    . Maintain current health        This is a list of the screening recommended for you and due dates:  Health Maintenance  Topic Date Due  .  Hepatitis C: One time screening is recommended by Center for Disease Control  (CDC) for  adults born from [birth year] through 1964.   Jul 13, 1950  . Tetanus Vaccine  08/05/1970  . DEXA scan (bone density measurement)  08/05/2016  . Pneumonia vaccines (1 of 2 - PCV13) 08/05/2016  . Mammogram  08/13/2016  . Flu Shot  01/07/2018  . Colon Cancer Screening  08/13/2024   Preventive Care for Adults  A healthy lifestyle and preventive care can promote health and wellness. Preventive health guidelines for adults include the following key practices.  . A routine yearly physical is a good way to check with your health care provider about your health and preventive screening. It is a chance to share any concerns and updates on your health and to receive a thorough exam.  . Visit your dentist for a routine exam and preventive care every 6 months. Brush your teeth twice a day and floss once a day. Good oral hygiene prevents tooth decay and gum disease.  . The frequency of eye exams is based on your age, health, family medical history, use  of contact lenses, and other factors. Follow your health care provider's recommendations for frequency of eye exams.  . Eat a healthy diet. Foods like vegetables, fruits, whole grains, low-fat dairy products, and lean protein foods contain the nutrients you need without too many calories. Decrease your intake of foods high in solid fats, added sugars, and salt. Eat the right amount of calories for you. Get information about a proper diet from your health  care provider, if necessary.  . Regular physical exercise is one of the most important things you can do for your health. Most adults should get at least 150 minutes of moderate-intensity exercise (any activity that increases your heart rate and causes you to sweat) each week. In addition, most adults need muscle-strengthening exercises on 2 or more days a week.  Silver Sneakers may be a benefit available to you. To determine eligibility, you may visit the website: www.silversneakers.com or contact program at [phone] Mon-Fri between 8AM-8PM.   . Maintain a healthy weight. The body mass index (BMI) is a screening tool to identify possible weight problems. It provides an estimate of body fat based on height and weight. Your health care provider can find your BMI and can help you achieve or maintain a healthy weight.   For adults 20 years and older: ? A BMI below 18.5 is considered underweight. ? A BMI of 18.5 to 24.9 is normal. ? A BMI of 25 to 29.9 is considered overweight. ? A BMI of 30 and above is considered obese.   . Maintain normal blood lipids and cholesterol levels by exercising and minimizing your intake of saturated fat. Eat a balanced diet with plenty of fruit and vegetables. Blood tests for lipids and cholesterol should begin at age 12 and be repeated every 5 years. If your lipid or cholesterol levels are high, you are over 50,  or you are at high risk for heart disease, you may need your cholesterol levels checked more frequently. Ongoing high lipid and cholesterol levels should be treated with medicines if diet and exercise are not working.  . If you smoke, find out from your health care provider how to quit. If you do not use tobacco, please do not start.  . If you choose to drink alcohol, please do not consume more than 2 drinks per day. One drink is considered to be 12 ounces (355 mL) of beer, 5 ounces (148 mL) of wine, or 1.5 ounces (44 mL) of liquor.  . If you are 66-61  years old, ask your health care provider if you should take aspirin to prevent strokes.  . Use sunscreen. Apply sunscreen liberally and repeatedly throughout the day. You should seek shade when your shadow is shorter than you. Protect yourself by wearing long sleeves, pants, a wide-brimmed hat, and sunglasses year round, whenever you are outdoors.  . Once a month, do a whole body skin exam, using a mirror to look at the skin on your back. Tell your health care provider of new moles, moles that have irregular borders, moles that are larger than a pencil eraser, or moles that have changed in shape or color.

## 2017-12-02 NOTE — Progress Notes (Signed)
I have personally reviewed the Medicare Annual Wellness questionnaire and have noted 1. The patient's medical and social history 2. Their use of alcohol, tobacco or illicit drugs 3. Their current medications and supplements 4. The patient's functional ability including ADL's, fall risks, home safety risks and hearing or visual impairment. 5. Diet and physical activities 6. Evidence for depression or mood disorders 7. Reviewed Updated provider list, see scanned forms and [facility] Snapshot.   The patients weight, height, BMI and visual acuity have been recorded in the chart I have made referrals, counseling and provided education to the patient based review of the above and I have provided the pt with a written personalized care plan for preventive services.  I have provided the patient with a copy of your personalized plan for preventive services. Instructed to take the time to review along with their updated medication list.  Yitzchok Carriger DO 

## 2017-12-03 NOTE — Progress Notes (Signed)
   Evelyn Jordan is a 67 y.o. female here for an acute visit.  History of Present Illness:   Evelyn Jordan, CMA acting as scribe for Dr. Briscoe Deutscher.   HPI: Patient in office for evaluation. She has been trouble sleeping for the last year or so. She has trouble falling asleep and staying asleep. Only is able to sleep in 2-3 hour intervals then is up for two or more hours at a time. She has tried changing diet, exercise, over the counter sleep aids, meditation and warm milk with no help.   PMHx, SurgHx, SocialHx, Medications, and Allergies were reviewed in the Visit Navigator and updated as appropriate.  Current Medications:   Current Outpatient Medications:  .  traZODone (DESYREL) 50 MG tablet, Take 0.5-1 tablets (25-50 mg total) by mouth at bedtime as needed for sleep., Disp: 30 tablet, Rfl: 3   Allergies  Allergen Reactions  . Compazine [Prochlorperazine Edisylate]     Neck hyperextends   Review of Systems:   Pertinent items are noted in the HPI. Otherwise, ROS is negative.  Vitals:   Vitals:   12/04/17 1513  BP: 128/68  Pulse: 69  Temp: 98.6 F (37 C)  TempSrc: Oral  SpO2: 96%  Weight: 122 lb (55.3 kg)  Height: 5' 3.25" (1.607 m)     Body mass index is 21.44 kg/m.  Physical Exam:   Physical Exam  Constitutional: She appears well-nourished.  HENT:  Head: Normocephalic and atraumatic.  Eyes: Pupils are equal, round, and reactive to light. EOM are normal.  Neck: Normal range of motion. Neck supple.  Cardiovascular: Normal rate, regular rhythm, normal heart sounds and intact distal pulses.  Pulmonary/Chest: Effort normal.  Abdominal: Soft.  Skin: Skin is warm.  Psychiatric: She has a normal mood and affect. Her behavior is normal.  Nursing note and vitals reviewed.  Assessment and Plan:   Evelyn Jordan was seen today for insomnia.  Diagnoses and all orders for this visit:  Adjustment insomnia -     traZODone (DESYREL) 50 MG tablet; Take 0.5-1 tablets  (25-50 mg total) by mouth at bedtime as needed for sleep.  Encounter for hepatitis C virus screening test for high risk patient -     Hepatitis C antibody; Future    . Reviewed expectations re: course of current medical issues. . Discussed self-management of symptoms. . Outlined signs and symptoms indicating need for more acute intervention. . Patient verbalized understanding and all questions were answered. Marland Kitchen Health Maintenance issues including appropriate healthy diet, exercise, and smoking avoidance were discussed with patient. . See orders for this visit as documented in the electronic medical record. . Patient received an After Visit Summary.  CMA served as Education administrator during this visit. History, Physical, and Plan performed by medical provider. The above documentation has been reviewed and is accurate and complete. Briscoe Deutscher, D.O.  Briscoe Deutscher, DO [place], [place] [facility] 12/04/2017

## 2017-12-04 ENCOUNTER — Encounter: Payer: Self-pay | Admitting: Family Medicine

## 2017-12-04 ENCOUNTER — Ambulatory Visit (INDEPENDENT_AMBULATORY_CARE_PROVIDER_SITE_OTHER): Payer: Medicare Other | Admitting: Family Medicine

## 2017-12-04 VITALS — BP 128/68 | HR 69 | Temp 98.6°F | Ht 63.25 in | Wt 122.0 lb

## 2017-12-04 DIAGNOSIS — Z9189 Other specified personal risk factors, not elsewhere classified: Secondary | ICD-10-CM

## 2017-12-04 DIAGNOSIS — Z1159 Encounter for screening for other viral diseases: Secondary | ICD-10-CM

## 2017-12-04 DIAGNOSIS — F5102 Adjustment insomnia: Secondary | ICD-10-CM

## 2017-12-04 DIAGNOSIS — F5101 Primary insomnia: Secondary | ICD-10-CM | POA: Insufficient documentation

## 2017-12-04 MED ORDER — TRAZODONE HCL 50 MG PO TABS: 25.0000 mg | ORAL_TABLET | Freq: Every evening | ORAL | 3 refills | Status: DC | PRN

## 2017-12-17 ENCOUNTER — Ambulatory Visit (HOSPITAL_BASED_OUTPATIENT_CLINIC_OR_DEPARTMENT_OTHER)
Admission: RE | Admit: 2017-12-17 | Discharge: 2017-12-17 | Disposition: A | Discharge status: Home / Self Care | Payer: Medicare Other | Source: Ambulatory Visit | Attending: Family Medicine | Admitting: Family Medicine

## 2017-12-17 ENCOUNTER — Encounter (HOSPITAL_BASED_OUTPATIENT_CLINIC_OR_DEPARTMENT_OTHER): Payer: Self-pay

## 2017-12-17 DIAGNOSIS — M85852 Other specified disorders of bone density and structure, left thigh: Secondary | ICD-10-CM | POA: Insufficient documentation

## 2017-12-17 DIAGNOSIS — Z780 Asymptomatic menopausal state: Secondary | ICD-10-CM | POA: Diagnosis not present

## 2017-12-17 DIAGNOSIS — Z1231 Encounter for screening mammogram for malignant neoplasm of breast: Secondary | ICD-10-CM | POA: Diagnosis not present

## 2017-12-17 DIAGNOSIS — Z1239 Encounter for other screening for malignant neoplasm of breast: Secondary | ICD-10-CM

## 2017-12-18 ENCOUNTER — Encounter: Payer: Self-pay | Admitting: Surgical

## 2017-12-22 ENCOUNTER — Ambulatory Visit: Payer: Medicare Other | Admitting: Family Medicine

## 2017-12-23 ENCOUNTER — Encounter: Payer: Self-pay | Admitting: Family Medicine

## 2017-12-23 ENCOUNTER — Ambulatory Visit (INDEPENDENT_AMBULATORY_CARE_PROVIDER_SITE_OTHER): Payer: Medicare Other | Admitting: Family Medicine

## 2017-12-23 DIAGNOSIS — M8580 Other specified disorders of bone density and structure, unspecified site: Secondary | ICD-10-CM | POA: Insufficient documentation

## 2017-12-23 DIAGNOSIS — M85859 Other specified disorders of bone density and structure, unspecified thigh: Secondary | ICD-10-CM

## 2017-12-23 DIAGNOSIS — M810 Age-related osteoporosis without current pathological fracture: Secondary | ICD-10-CM | POA: Insufficient documentation

## 2017-12-23 MED ORDER — TETANUS-DIPHTH-ACELL PERTUSSIS 5-2.5-18.5 LF-MCG/0.5 IM SUSP: 0.5000 mL | Freq: Once | INTRAMUSCULAR | 0 refills | Status: AC

## 2017-12-23 NOTE — Progress Notes (Signed)
ERROR

## 2017-12-28 ENCOUNTER — Telehealth: Payer: Self-pay | Admitting: Family Medicine

## 2017-12-28 NOTE — Telephone Encounter (Signed)
Ok to call in

## 2017-12-28 NOTE — Telephone Encounter (Signed)
See note

## 2017-12-28 NOTE — Telephone Encounter (Signed)
Copied from [place] [id number]. Topic: Quick Communication - See Telephone Encounter >> Dec 28, 2017  3:50 PM Bea Graff, NT wrote: CRM for notification. See Telephone encounter for: 12/28/17. Pt would like to see if diflucan can be called in for a yeast infection. [facility] [place] [facility] [place], [place] [phone] (Phone) [phone] (Fax)

## 2017-12-28 NOTE — Telephone Encounter (Signed)
Yes. Diflucan 150 mg po x 1, again 3 days later if needed. #2.

## 2017-12-29 ENCOUNTER — Other Ambulatory Visit: Payer: Self-pay

## 2017-12-29 MED ORDER — FLUCONAZOLE 150 MG PO TABS: 150.0000 mg | ORAL_TABLET | Freq: Once | ORAL | 0 refills | Status: AC

## 2017-12-29 NOTE — Telephone Encounter (Signed)
Script sent in

## 2018-01-12 ENCOUNTER — Telehealth: Payer: Self-pay | Admitting: Family Medicine

## 2018-01-12 NOTE — Telephone Encounter (Signed)
Copied from [place] [id number]. Topic: Quick Communication - See Telephone Encounter >> Jan 12, 2018 11:00 AM Ahmed Prima L wrote: CRM for notification. See Telephone encounter for: 01/12/18.  Patient states she is currently on traZODone (DESYREL) 50 MG tablet and she sees no difference in her sleep and would like to try something else.   [facility] [place], [place] [address] [place] [address]

## 2018-01-12 NOTE — Telephone Encounter (Signed)
Visit needed.

## 2018-01-12 NOTE — Telephone Encounter (Signed)
Called patient she is leaving for out of town tomorrow for six weeks will call when she gets back to make an appointment.

## 2018-01-12 NOTE — Telephone Encounter (Signed)
See note

## 2018-01-12 NOTE — Telephone Encounter (Signed)
Want me to call patient for office visit.

## 2018-03-05 DIAGNOSIS — H40013 Open angle with borderline findings, low risk, bilateral: Secondary | ICD-10-CM | POA: Diagnosis not present

## 2018-03-22 DIAGNOSIS — L821 Other seborrheic keratosis: Secondary | ICD-10-CM | POA: Diagnosis not present

## 2018-03-22 DIAGNOSIS — L578 Other skin changes due to chronic exposure to nonionizing radiation: Secondary | ICD-10-CM | POA: Diagnosis not present

## 2018-03-23 NOTE — Progress Notes (Signed)
   Evelyn Jordan is a 67 y.o. female here for an acute visit.  History of Present Illness:   HPI: Knee pain, left, x 1 week, gradual onset, no injury or overuse, no heat or redness, no Hx of the same, treated with NSAIDs with minor improvement, hard to extend fully.  Insomnia, nightly, hard to fall and stay asleep, Trazodone 100 mg did not work, would like to discuss other treatments.   PMHx, SurgHx, SocialHx, Medications, and Allergies were reviewed in the Visit Navigator and updated as appropriate.  Current Medications:   .  None  Allergies  Allergen Reactions  . Compazine [Prochlorperazine Edisylate]     Neck hyperextends   Review of Systems:   Pertinent items are noted in the HPI. Otherwise, ROS is negative.  Vitals:   Vitals:   03/24/18 1153  BP: 136/84  Pulse: 66  Temp: 98 F (36.7 C)  TempSrc: Oral  SpO2: 97%  Weight: 119 lb 3.2 oz (54.1 kg)  Height: 5' 3.25" (1.607 m)     Body mass index is 20.95 kg/m.  Physical Exam:   Physical Exam  Constitutional: She appears well-nourished.  HENT:  Head: Normocephalic and atraumatic.  Eyes: Pupils are equal, round, and reactive to light. EOM are normal.  Neck: Normal range of motion. Neck supple.  Cardiovascular: Normal rate, regular rhythm, normal heart sounds and intact distal pulses.  Pulmonary/Chest: Effort normal.  Abdominal: Soft.  Musculoskeletal:       Left knee: She exhibits decreased range of motion and swelling. She exhibits no deformity, no LCL laxity, normal patellar mobility and no MCL laxity. No medial joint line, no lateral joint line, no MCL, no LCL and no patellar tendon tenderness noted.  Fullness posteriorly consistent with Baker's cyst.   Skin: Skin is warm.  Psychiatric: She has a normal mood and affect. Her behavior is normal.  Nursing note and vitals reviewed.  Assessment and Plan:   Evelyn Jordan was seen today for knee pain.  Diagnoses and all orders for this visit:  Baker's cyst of  knee, left Comments: Reviewed diagnosis and treatment. If no improvement, to [place].  Primary insomnia Comments: Long discussion re: options. Discussed sleep hygeine as well. Patient would like to try medication below. Orders: -     zolpidem (AMBIEN) 5 MG tablet; Take 1 tablet (5 mg total) by mouth at bedtime as needed for sleep.   . Reviewed expectations re: course of current medical issues. . Discussed self-management of symptoms. . Outlined signs and symptoms indicating need for more acute intervention. . Patient verbalized understanding and all questions were answered. Marland Kitchen Health Maintenance issues including appropriate healthy diet, exercise, and smoking avoidance were discussed with patient. . See orders for this visit as documented in the electronic medical record. . Patient received an After Visit Summary.   Briscoe Deutscher, DO [place], [facility] 03/25/2018

## 2018-03-24 ENCOUNTER — Ambulatory Visit (INDEPENDENT_AMBULATORY_CARE_PROVIDER_SITE_OTHER): Payer: Medicare Other | Admitting: Family Medicine

## 2018-03-24 ENCOUNTER — Encounter: Payer: Self-pay | Admitting: Family Medicine

## 2018-03-24 VITALS — BP 136/84 | HR 66 | Temp 98.0°F | Ht 63.25 in | Wt 119.2 lb

## 2018-03-24 DIAGNOSIS — F5101 Primary insomnia: Secondary | ICD-10-CM

## 2018-03-24 DIAGNOSIS — M7122 Synovial cyst of popliteal space [Baker], left knee: Secondary | ICD-10-CM

## 2018-03-24 MED ORDER — ZOLPIDEM TARTRATE 5 MG PO TABS: 5.0000 mg | ORAL_TABLET | Freq: Every evening | ORAL | 1 refills | Status: DC | PRN

## 2018-03-24 NOTE — Patient Instructions (Signed)
Baker Cyst A Baker cyst, also called a popliteal cyst, is a sac-like growth that forms at the back of the knee. The cyst forms when the fluid-filled sac (bursa) that cushions the knee joint becomes enlarged. The bursa that becomes a Baker cyst is located at the back of the knee joint. What are the causes? In most cases, a Baker cyst results from another knee problem that causes swelling inside the knee. This makes the fluid inside the knee joint (synovial fluid) flow into the bursa behind the knee, causing the bursa to enlarge. What increases the risk? You may be more likely to develop a Baker cyst if you already have a knee problem, such as:  A tear in cartilage that cushions the knee joint (meniscal tear).  A tear in the tissues that connect the bones of the knee joint (ligament tear).  Knee swelling from osteoarthritis, rheumatoid arthritis, or gout.  What are the signs or symptoms? A Baker cyst does not always cause symptoms. A lump behind the knee may be the only sign of the condition. The lump may be painful, especially when the knee is straightened. If the lump is painful, the pain may come and go. The knee may also be stiff. Symptoms may quickly get more severe if the cyst breaks open (ruptures). If your cyst ruptures, signs and symptoms may affect the knee and the back of the lower leg (calf) and may include:  Sudden or worsening pain.  Swelling.  Bruising.  How is this diagnosed? This condition may be diagnosed based on your symptoms and medical history. Your health care provider will also do a physical exam. This may include:  Feeling the cyst to check whether it is tender.  Checking your knee for signs of another knee condition that causes swelling.  You may have imaging tests, such as:  X-rays.  MRI.  Ultrasound.  How is this treated? A Baker cyst that is not painful may go away without treatment. If the cyst gets large or painful, it will likely get better if the  underlying knee problem is treated. Treatment for a Baker cyst may include:  Resting.  Keeping weight off of the knee. This means not leaning on the knee to support your body weight.  NSAIDs to reduce pain and swelling.  A procedure to drain the fluid from the cyst with a needle (aspiration). You may also get an injection of a medicine that reduces swelling (steroid).  Surgery. This may be needed if other treatments do not work. This usually involves correcting knee damage and removing the cyst.  Follow these instructions at home:  Take over-the-counter and prescription medicines only as told by your health care provider.  Rest and return to your normal activities as told by your health care provider. Avoid activities that make pain or swelling worse. Ask your health care provider what activities are safe for you.  Keep all follow-up visits as told by your health care provider. This is important. Contact a health care provider if:  You have knee pain, stiffness, or swelling that does not get better. Get help right away if:  You have sudden or worsening pain and swelling in your calf area. This information is not intended to replace advice given to you by your health care provider. Make sure you discuss any questions you have with your health care provider. Document Released: 05/26/2005 Document Revised: 02/14/2016 Document Reviewed: 02/14/2016 Elsevier Interactive Patient Education  2018 [employer].

## 2018-03-25 ENCOUNTER — Encounter: Payer: Self-pay | Admitting: Family Medicine

## 2018-03-31 ENCOUNTER — Telehealth: Payer: Self-pay | Admitting: Family Medicine

## 2018-03-31 DIAGNOSIS — M7120 Synovial cyst of popliteal space [Baker], unspecified knee: Secondary | ICD-10-CM

## 2018-03-31 NOTE — Telephone Encounter (Signed)
Left message for patient for patient to call the office to schedule with Dr. Paulla Fore.

## 2018-03-31 NOTE — Telephone Encounter (Signed)
Copied from [place] [id number]. Topic: Referral - Request for Referral >> Mar 31, 2018  7:34 AM Scherrie Gerlach wrote: Has patient seen PCP for this complaint? yes Pt states she saw Dr Juleen China for a baker's cyst behind left knee and if nit better to call back and she can refer to have it drained.  Pt states not better and she would like to schedule sometime next week as she is out of town this week.

## 2018-03-31 NOTE — Telephone Encounter (Signed)
Evelyn Jordan.

## 2018-03-31 NOTE — Telephone Encounter (Signed)
Who would you like her to see.

## 2018-04-02 NOTE — Telephone Encounter (Signed)
Patient is scheduled with Dr. Paulla Fore.

## 2018-04-05 ENCOUNTER — Ambulatory Visit (INDEPENDENT_AMBULATORY_CARE_PROVIDER_SITE_OTHER): Payer: Medicare Other | Admitting: Sports Medicine

## 2018-04-05 ENCOUNTER — Ambulatory Visit: Payer: Medicare Other | Admitting: Sports Medicine

## 2018-04-05 ENCOUNTER — Encounter: Payer: Self-pay | Admitting: Sports Medicine

## 2018-04-05 ENCOUNTER — Ambulatory Visit: Payer: Self-pay

## 2018-04-05 VITALS — BP 122/80 | HR 80 | Ht 63.25 in | Wt 121.8 lb

## 2018-04-05 DIAGNOSIS — M1712 Unilateral primary osteoarthritis, left knee: Secondary | ICD-10-CM | POA: Diagnosis not present

## 2018-04-05 DIAGNOSIS — M25562 Pain in left knee: Secondary | ICD-10-CM | POA: Diagnosis not present

## 2018-04-05 DIAGNOSIS — M7120 Synovial cyst of popliteal space [Baker], unspecified knee: Secondary | ICD-10-CM | POA: Diagnosis not present

## 2018-04-05 NOTE — Procedures (Signed)
PROCEDURE NOTE:  Ultrasound Guided: Aspiration and Injection: Left knee Images were obtained and interpreted by myself, Teresa Coombs, DO  Images have been saved and stored to PACS system. Images obtained on: GE S7 Ultrasound machine    ULTRASOUND FINDINGS:  Moderate effusion, moderate Baker's cyst. Mild degenerative changes  DESCRIPTION OF PROCEDURE:  The patient's clinical condition is marked by substantial pain and/or significant functional disability. Other conservative therapy has not provided relief, is contraindicated, or not appropriate. There is a reasonable likelihood that injection will significantly improve the patient's pain and/or functional impairment.   After discussing the risks, benefits and expected outcomes of the injection and all questions were reviewed and answered, the patient wished to undergo the above named procedure.  Verbal consent was obtained.  The ultrasound was used to identify the target structure and adjacent neurovascular structures. The skin was then prepped in sterile fashion and the target structure was injected under direct visualization using sterile technique as below:  Single injection performed as below: PREP: Alcohol, Ethel Chloride and 5 cc 1% lidocaine on 25g 1.5 in. needle APPROACH:superiolateral, stopcock technique, 18g 1.5 in. INJECTATE: 2 cc 0.5% Marcaine and 2 cc 40mg /mL DepoMedrol ASPIRATE: 76mL  and clear and straw colored  DRESSING: Band-Aid and 6-inch Ace Wrap  Post procedural instructions including recommending icing and warning signs for infection were reviewed.    This procedure was well tolerated and there were no complications.   IMPRESSION: Succesful Ultrasound Guided: Aspiration and Injection

## 2018-04-05 NOTE — Patient Instructions (Addendum)

## 2018-04-05 NOTE — Progress Notes (Signed)
Evelyn Jordan. Evelyn Jordan, Evelyn Jordan  Evelyn Jordan - 67 y.o. female MRN [id number]  Date of birth: [birth year]  Visit Date: 04/05/2018  PCP: Briscoe Deutscher, DO   Referred by: Briscoe Deutscher, DO   Scribe(s) for today's visit: Josepha Pigg, CMA  SUBJECTIVE:  Evelyn Jordan is here for Initial Assessment (L knee pain)  Referred by: Dr. Briscoe Deutscher  HPI: 04/05/2018: Her L knee pain symptoms INITIALLY: Began about  weeks ago and MOI is unknown, gradual onset.  Described as severe tightness and discomfort, radiating to the L leg. Worsened with extension, flexion. Improved with rest.  Additional associated symptoms include: Arthroscopy about 20 years ago after horse fell on her. She reports swelling around the knee. Denies clicking, popping, catching, increased warmth, or redness.     At this time symptoms are worsening compared to onset. She has been taking NSAIDs with minimal relief. She has tried using compression with some relief.   XR L knee 10/17/2016: IMPRESSION: 1. Small joint effusion without acute finding or degenerative narrowing. 2. Atherosclerosis.  She reports "spot" on the R pinky that popped up this morning.   REVIEW OF SYSTEMS: Reports night time disturbances. Denies fevers, chills, or night sweats. Denies unexplained weight loss. Denies personal history of cancer. Denies changes in bowel or bladder habits. Denies recent unreported falls. Denies new or worsening dyspnea or wheezing. Denies headaches or dizziness.  Denies numbness, tingling or weakness in the extremities.  Denies dizziness or presyncopal episodes Reports lower extremity edema    HISTORY:  Prior history reviewed and updated per electronic medical record.  Social History   Occupational History    Comment: retired Marine scientist   Tobacco Use  . Smoking status: Former Research scientist (life sciences)  . Smokeless tobacco: Never Used  . Tobacco  comment: quit in 2011  Substance and Sexual Activity  . Alcohol use: Yes    Comment: Rarely  . Drug use: No  . Sexual activity: Not on file   Social History   Social History Narrative  . Not on file    History reviewed. No pertinent past medical history. Past Surgical History:  Procedure Laterality Date  . BREAST BIOPSY Right    benign   family history includes Alzheimer's disease in her mother; Hypertension in her father; Stroke in her father.  DATA OBTAINED & REVIEWED:  No results for input(s): HGBA1C, LABURIC, CREATINE in the last 8760 hours. . 10/17/16 - XR Left Knee: mild degenerative changes  o Prior injury to Left knee from horse falling on it  OBJECTIVE:  VS:  HT:5' 3.25" (160.7 cm)   WT:121 lb 12.8 oz (55.2 kg)  BMI:21.39    BP:122/80  HR:80bpm  TEMP: ( )  RESP:98 %   PHYSICAL EXAM: CONSTITUTIONAL: Well-developed, Well-nourished and In no acute distress PSYCHIATRIC: Alert & appropriately interactive. and Not depressed or anxious appearing. RESPIRATORY: No increased work of breathing and Trachea Midline EYES: Pupils are equal., EOM intact without nystagmus. and No scleral icterus.  VASCULAR EXAM: Warm and well perfused NEURO: unremarkable  MSK Exam:  Left Knee  Alignment & Contours: normal Skin: No overlying erythema/ecchymosis Moderate non-tense effusion   Generalized Synovitis: none Knee Tenderness: No focal bony TTP Gait: normal Patellar grind produces: No pain and No crepitation   RANGE OF MOTION & STRENGTH  EXTENSION: Normal  with mild pain.   Strength: Normal FLEXION: Normal with mild pain.   Strength: Normal    LIGAMENTOUS  TESTING  Varus & Valgus Strain: stable to testing Anterior & Posterior Drawer: stable to testing   SPECIALITY TESTING:  Patellar Apprehension: normal, no pain J Sign: normal, no pain Mcmurray's: normal, no pain Thessaly: normal, no pain    ASSESSMENT   1. Acute pain of left knee   2. Synovial cyst of popliteal  space, unspecified laterality   3. Primary osteoarthritis of left knee     PLAN:  Pertinent additional documentation may be included in corresponding procedure notes, imaging studies, problem based documentation and patient instructions.  Procedures:  . US Guided Injection per procedure note  Medications:  No orders of the defined types were placed in this encounter.  Discussion/Instructions: Synovial cyst of popliteal space Small to moderate IA injection today.  If any lack of improvement consider direct aspiration/injection   . mild signs of rupture on [place]. Should respond well to IA injection performed today. . Body Helix Compression Sleeve provided today per AVS . Discussed red flag symptoms that warrant earlier emergent evaluation and patient voices understanding. . Activity modifications and the importance of avoiding exacerbating activities (limiting pain to no more than a 4 / 10 during or following activity) recommended and discussed.  Follow-up:  . Return in about 6 weeks (around 05/17/2018) for repeat clinical exam.   . If any lack of improvement consider: further diagnostic evaluation with repeat X-rays of knee and or MRI of left knee     CMA/ATC served as scribe during this visit. History, Physical, and Plan performed by medical provider. Documentation and orders reviewed and attested to.      Gerda Diss, [facility] Physician

## 2018-04-05 NOTE — Assessment & Plan Note (Signed)
Small to moderate IA injection today.  If any lack of improvement consider direct aspiration/injection

## 2018-04-06 ENCOUNTER — Ambulatory Visit: Payer: Medicare Other | Admitting: Sports Medicine

## 2018-04-20 ENCOUNTER — Encounter: Payer: Self-pay | Admitting: Sports Medicine

## 2018-04-21 ENCOUNTER — Encounter: Payer: Self-pay | Admitting: Sports Medicine

## 2018-04-22 ENCOUNTER — Encounter: Payer: Self-pay | Admitting: Sports Medicine

## 2018-04-22 ENCOUNTER — Ambulatory Visit: Payer: Self-pay

## 2018-04-22 ENCOUNTER — Ambulatory Visit (INDEPENDENT_AMBULATORY_CARE_PROVIDER_SITE_OTHER): Payer: Medicare Other | Admitting: Sports Medicine

## 2018-04-22 VITALS — BP 162/92 | HR 65 | Ht 63.25 in | Wt 123.3 lb

## 2018-04-22 DIAGNOSIS — M7122 Synovial cyst of popliteal space [Baker], left knee: Secondary | ICD-10-CM

## 2018-04-22 NOTE — Progress Notes (Signed)
Juanda Bond. Akshar Starnes, [place] at [place]  JENAVEVE FENSTERMAKER - 67 y.o. female MRN [id number]  Date of birth: August 07, 1950  Visit Date: 04/22/2018  PCP: Briscoe Deutscher, DO   Referred by: Briscoe Deutscher, DO   Scribe(s) for today's visit: Josepha Pigg, CMA  SUBJECTIVE:  BRIANI MAUL is here for Follow-up (L knee pain)  Referred by: Dr. Briscoe Deutscher  HPI: 04/05/2018: Her L knee pain symptoms INITIALLY: Began about  weeks ago and MOI is unknown, gradual onset.  Described as severe tightness and discomfort, radiating to the L leg. Worsened with extension, flexion. Improved with rest.  Additional associated symptoms include: Arthroscopy about 20 years ago after horse fell on her. She reports swelling around the knee. Denies clicking, popping, catching, increased warmth, or redness.    At this time symptoms are worsening compared to onset. She has been taking NSAIDs with minimal relief. She has tried using compression with some relief.  XR L knee 10/17/2016: IMPRESSION: 1. Small joint effusion without acute finding or degenerative narrowing. 2. Atherosclerosis. She reports "spot" on the R pinky that popped up this morning.   04/22/2018: Compared to the last office visit, her previously described symptoms show no change. She received steroid injection at her last visit but continues to have pain and swelling behind her knee.  Current symptoms are moderate & are radiating to the L thigh and lower leg.  She reports no change to medication regimen or other modalities for pain.    REVIEW OF SYSTEMS: Reports night time disturbances. Denies fevers, chills, or night sweats. Denies unexplained weight loss. Denies personal history of cancer. Denies changes in bowel or bladder habits. Denies recent unreported falls. Denies new or worsening dyspnea or wheezing. Denies headaches or dizziness.  Denies numbness, tingling or weakness  in the extremities.  Denies dizziness or presyncopal episodes Reports lower extremity edema    HISTORY:  Prior history reviewed and updated per electronic medical record.  Social History   Occupational History    Comment: retired Marine scientist   Tobacco Use  . Smoking status: Former Research scientist (life sciences)  . Smokeless tobacco: Never Used  . Tobacco comment: quit in 2011  Substance and Sexual Activity  . Alcohol use: Yes    Comment: Rarely  . Drug use: No  . Sexual activity: Not on file   Social History   Social History Narrative  . Not on file    History reviewed. No pertinent past medical history. Past Surgical History:  Procedure Laterality Date  . BREAST BIOPSY Right    benign   family history includes Alzheimer's disease in her mother; Hypertension in her father; Stroke in her father.  DATA OBTAINED & REVIEWED:  No results for input(s): HGBA1C, LABURIC, CREATINE in the last 8760 hours. . 10/17/16 - XR Left Knee: mild degenerative changes  o Prior injury to Left knee from horse falling on it  OBJECTIVE:  VS:  HT:5' 3.25" (160.7 cm)   WT:123 lb 4.8 oz (55.9 kg)  BMI:21.66    BP:(!) 162/92  HR:65bpm  TEMP: ( )  RESP:97 %   PHYSICAL EXAM: CONSTITUTIONAL: Well-developed, Well-nourished and In no acute distress PSYCHIATRIC: Alert & appropriately interactive. and Not depressed or anxious appearing. RESPIRATORY: No increased work of breathing and Trachea Midline EYES: Pupils are equal., EOM intact without nystagmus. and No scleral icterus.  VASCULAR EXAM: Warm and well perfused NEURO: unremarkable  MSK Exam:  Left Knee  Alignment & Contours:  normal Skin: No overlying erythema/ecchymosis Small effusion small moderate Baker's cyst Generalized Synovitis: none Knee Tenderness: No focal bony TTP Gait: normal Patellar grind produces: No pain and No crepitation   RANGE OF MOTION & STRENGTH  EXTENSION: Normal  with mild pain.   Strength: Normal FLEXION: Normal with mild pain.    Strength: Normal    LIGAMENTOUS TESTING  Varus & Valgus Strain: stable to testing Anterior & Posterior Drawer: stable to testing   SPECIALITY TESTING:  Patellar Apprehension: normal, no pain J Sign: normal, no pain Mcmurray's: normal, no pain Thessaly: normal, no pain    ASSESSMENT   1. Baker's cyst of knee, left   2. Synovial cyst of left popliteal space     PLAN:  Pertinent additional documentation may be included in corresponding procedure notes, imaging studies, problem based documentation and patient instructions.  Procedures:  . US Guided Injection per procedure note  Medications:  No orders of the defined types were placed in this encounter.  Discussion/Instructions: No problem-specific Assessment & Plan notes found for this encounter.  . Persistent ongoing posterior symptoms complex.  Tenderness cyst versus Baker's cyst.  Direct injection into the cyst today under ultrasound guidance performed today without complications.  There is not enough fluid to aspirate however given the complex nature of it and likely one-way valve effect intra-articular injection was only partially beneficial. . Body Helix Compression Sleeve provided today per AVS . Discussed red flag symptoms that warrant earlier emergent evaluation and patient voices understanding. . Activity modifications and the importance of avoiding exacerbating activities (limiting pain to no more than a 4 / 10 during or following activity) recommended and discussed.  Follow-up:  . Return for keep scheduled follow-up.   . If any lack of improvement consider: consider further diagnostic evaluation with repeat X-rays of knee and or MRI of left knee     CMA/ATC served as scribe during this visit. History, Physical, and Plan performed by medical provider. Documentation and orders reviewed and attested to.      Gerda Diss, [facility] Physician

## 2018-04-22 NOTE — Procedures (Signed)
PROCEDURE NOTE:  Ultrasound Guided: Injection: Left knee, Peritendinous ganglion, Tendon Sheath Images were obtained and interpreted by myself, Teresa Coombs, DO  Images have been saved and stored to PACS system. Images obtained on: GE S7 Ultrasound machine    ULTRASOUND FINDINGS:  He has a documented Baker's cyst that has been decompressed however there remains a complex serpiginous ganglion that is.  Tenderness along the hamstring musculature consistent more so with a peritendinous ganglion cyst as opposed to a true Baker's cyst.  DESCRIPTION OF PROCEDURE:  The patient's clinical condition is marked by substantial pain and/or significant functional disability. Other conservative therapy has not provided relief, is contraindicated, or not appropriate. There is a reasonable likelihood that injection will significantly improve the patient's pain and/or functional impairment.   After discussing the risks, benefits and expected outcomes of the injection and all questions were reviewed and answered, the patient wished to undergo the above named procedure.  Verbal consent was obtained.  The ultrasound was used to identify the target structure and adjacent neurovascular structures. The skin was then prepped in sterile fashion and the target structure was injected under direct visualization using sterile technique as below:  Single injection performed as below: PREP: Alcohol and Ethel Chloride APPROACH:posterior direct, single injection, 25g 1.5 in. INJECTATE: 1 cc 0.5% Marcaine and 1 cc 40mg /mL DepoMedrol ASPIRATE: None DRESSING: Band-Aid  Post procedural instructions including recommending icing and warning signs for infection were reviewed.    This procedure was well tolerated and there were no complications.   IMPRESSION: Succesful Ultrasound Guided: Injection

## 2018-04-22 NOTE — Patient Instructions (Signed)

## 2018-04-24 ENCOUNTER — Encounter: Payer: Self-pay | Admitting: Sports Medicine

## 2018-04-26 ENCOUNTER — Encounter: Payer: Self-pay | Admitting: Sports Medicine

## 2018-05-04 ENCOUNTER — Encounter: Payer: Self-pay | Admitting: Sports Medicine

## 2018-05-09 HISTORY — PX: OTHER SURGICAL HISTORY: SHX169

## 2018-05-17 ENCOUNTER — Ambulatory Visit: Payer: Self-pay

## 2018-05-17 ENCOUNTER — Ambulatory Visit (INDEPENDENT_AMBULATORY_CARE_PROVIDER_SITE_OTHER): Payer: Medicare Other | Admitting: Sports Medicine

## 2018-05-17 ENCOUNTER — Encounter: Payer: Self-pay | Admitting: Sports Medicine

## 2018-05-17 ENCOUNTER — Ambulatory Visit (INDEPENDENT_AMBULATORY_CARE_PROVIDER_SITE_OTHER): Payer: Medicare Other

## 2018-05-17 VITALS — BP 120/82 | HR 94 | Ht 63.25 in | Wt 121.6 lb

## 2018-05-17 DIAGNOSIS — M25562 Pain in left knee: Secondary | ICD-10-CM | POA: Diagnosis not present

## 2018-05-17 DIAGNOSIS — G8929 Other chronic pain: Secondary | ICD-10-CM

## 2018-05-17 DIAGNOSIS — Z23 Encounter for immunization: Secondary | ICD-10-CM

## 2018-05-17 DIAGNOSIS — M7122 Synovial cyst of popliteal space [Baker], left knee: Secondary | ICD-10-CM

## 2018-05-17 DIAGNOSIS — M1712 Unilateral primary osteoarthritis, left knee: Secondary | ICD-10-CM | POA: Diagnosis not present

## 2018-05-17 MED ORDER — CELECOXIB 100 MG PO CAPS: 100.0000 mg | ORAL_CAPSULE | Freq: Two times a day (BID) | ORAL | 2 refills | Status: DC | PRN

## 2018-05-17 NOTE — Assessment & Plan Note (Signed)
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 Will repeat aspiration today with Toradol given severity of pain and discussed this is temporizing.  Ultimately MRI indicated and ordered today. Rest discussed. Continue ICE and compression Will plan for direct referral to [facility] after MRI obtained.  She would like to try to have surgery if needed prior to christmas.  Has several upcoming trips she is concerned about.

## 2018-05-17 NOTE — Procedures (Signed)
PROCEDURE NOTE:  Ultrasound Guided: Aspiration and Injection: Left knee Images were obtained and interpreted by myself, Teresa Coombs, DO  Images have been saved and stored to PACS system. Images obtained on: GE S7 Ultrasound machine    ULTRASOUND FINDINGS:  Large effusion Question posteriomedial meniscal cyst/tear + Bakers cyst with probable semitendinosus muscle ganglion cyst as well  DESCRIPTION OF PROCEDURE:  The patient's clinical condition is marked by substantial pain and/or significant functional disability. Other conservative therapy has not provided relief, is contraindicated, or not appropriate. There is a reasonable likelihood that injection will significantly improve the patient's pain and/or functional impairment.   After discussing the risks, benefits and expected outcomes of the injection and all questions were reviewed and answered, the patient wished to undergo the above named procedure.  Verbal consent was obtained.  The ultrasound was used to identify the target structure and adjacent neurovascular structures. The skin was then prepped in sterile fashion and the target structure was injected under direct visualization using sterile technique as below:  Single injection performed as below: PREP: Alcohol and Ethel Chloride APPROACH:superiolateral, stopcock technique, 18g 1.5 in. INJECTATE: 2 cc 30 mg Toradol.  60 cc total ASPIRATE: 26mL , straw colored  and cell count and crystal identification DRESSING: Band-Aid  Post procedural instructions including recommending icing and warning signs for infection were reviewed.    This procedure was well tolerated and there were no complications.   IMPRESSION: Succesful Ultrasound Guided: Aspiration and Injection

## 2018-05-17 NOTE — Progress Notes (Signed)
Evelyn Jordan. Rig[place], [place] at [facility] [id number]  Evelyn Jordan - 67 y.o. female MRN [id number]  Date of birth: [birth year]  Visit Date:   PCP: Briscoe Deutscher, DO   Referred by: Briscoe Deutscher, DO   SUBJECTIVE:  Chief Complaint  Patient presents with  . Follow-up    L knee pain    HPI: She reports only 2 days relief with the Depo-Medrol shot on 04/22/2018.  She is been able to go to [employer] [facility] but is having 6 out of 10 constant pain which increases intermittently to 9 out of 10 pain.  She reports stiffness and swelling that is constant.  Pain with flexion extension and weightbearing.  She has been using compression inflammatories.  Celebrex was never called in for she has been using ibuprofen.  She denies any locking or giving way.  REVIEW OF SYSTEMS: Reports night time disturbances. Denies fevers, chills, or night sweats. Denies unexplained weight loss. Denies personal history of cancer. Denies changes in bowel or bladder habits. Denies recent unreported falls. Denies new or worsening dyspnea or wheezing. Denies headaches or dizziness.  Denies numbness, tingling or weakness  In the extremities.  Denies dizziness or presyncopal episodes Reports lower extremity edema   HISTORY:  Prior history reviewed and updated per electronic medical record.  Social History   Occupational History    Comment: retired Marine scientist   Tobacco Use  . Smoking status: Former Research scientist (life sciences)  . Smokeless tobacco: Never Used  . Tobacco comment: quit in 2011  Substance and Sexual Activity  . Alcohol use: Yes    Comment: Rarely  . Drug use: No  . Sexual activity: Not on file   Social History   Social History Narrative  . Not on file      DATA OBTAINED & REVIEWED:  No results for input(s): HGBA1C, LABURIC, CREATINE, CALCIUM, AST, ALT, TSH in the last 8760 hours.  Invalid input(s): MAGNESIUM, CK Problem  Synovial Cyst of Popliteal Space   XR L  knee 10/17/2016: IMPRESSION: 1. Small joint effusion without acute finding or degenerative narrowing. 2. Atherosclerosis.  Updated XR 12/9 - Normal     The 10-year ASCVD risk score Mikey Bussing DC Jr., et al., 2013) is: 5.4%   Values used to calculate the score:     Age: 59 years     Sex: Female     Is Non-Hispanic African American: No     Diabetic: No     Tobacco smoker: No     Systolic Blood Pressure: 888 mmHg     Is BP treated: No     HDL Cholesterol: 76 mg/dL     Total Cholesterol: 196 mg/dL  OBJECTIVE:  VS:  HT:5' 3.25" (160.7 cm)   WT:121 lb 9.6 oz (55.2 kg)  BMI:21.36    BP:120/82  HR:94bpm  TEMP: ( )  RESP:97 %   PHYSICAL EXAM: CONSTITUTIONAL: Well-developed, Well-nourished and In no acute distress PSYCHIATRIC : Alert & appropriately interactive. and Not depressed or anxious appearing. RESPIRATORY : No increased work of breathing and Trachea Midline EYES : Pupils are equal., EOM intact without nystagmus. and No scleral icterus.  VASCULAR EXAM : Warm and well perfused NEURO: unremarkable  MSK Exam:  Left knee  Well aligned No significant deformity. No overlying skin changes No focal bony tenderness Large effusion. Pain with palpation of posterior knee.  Ligamentously stable     ASSESSMENT   1. Acute pain of left knee  2. Baker's cyst of knee, left   3. Chronic pain of left knee   4. Need for shingles vaccine   5. Synovial cyst of left popliteal space     PLAN:  Pertinent additional documentation may be included in corresponding procedure notes, imaging studies, problem based documentation and patient instructions.  Procedures:  US Guided Injection per procedure note  Medications:  Meds ordered this encounter  Medications  . celecoxib (CELEBREX) 100 MG capsule    Sig: Take 1 capsule (100 mg total) by mouth 2 (two) times daily as needed.    Dispense:  60 capsule    Refill:  2    Discussion/Instructions: Synovial cyst of popliteal space Chronic  recurrent knee effusion with baker's cyst and likely semi-tendinous cyst. Will repeat aspiration today with Toradol given severity of pain and discussed this is temporizing.  Ultimately MRI indicated and ordered today. Rest discussed. Continue ICE and compression Will plan for direct referral to [facility] after MRI obtained.  She would like to try to have surgery if needed prior to christmas.  Has several upcoming trips she is concerned about.   Discussed red flag symptoms that warrant earlier emergent evaluation and patient voices understanding. Activity modifications and the importance of avoiding exacerbating activities (limiting pain to no more than a 4 / 10 during or following activity) recommended and discussed.  Return for We will call you about your results.          Gerda Diss, [facility] Physician

## 2018-05-17 NOTE — Patient Instructions (Addendum)
You had an injection today.  Things to be aware of after injection are listed below: . You may experience no significant improvement or even a slight worsening in your symptoms during the first 24 to 48 hours.  After that we expect your symptoms to improve gradually over the next 2 weeks for the medicine to have its maximal effect.  You should continue to have improvement out to 6 weeks after your injection. . Dr. Paulla Fore recommends icing the site of the injection for 20 minutes  1-2 times the day of your injection . You may shower but no swimming, tub bath or Jacuzzi for 24 hours. . If your bandage falls off this does not need to be replaced.  It is appropriate to remove the bandage after 4 hours. . You may resume light activities as tolerated unless otherwise directed per Dr. Paulla Fore during your visit  POSSIBLE STEROID SIDE EFFECTS:  Side effects from injectable steroids tend to be less than when taken orally however you may experience some of the symptoms listed below.  If experienced these should only last for a short period of time. Change in menstrual flow  Edema (swelling)  Increased appetite Skin flushing (redness)  Skin rash/acne  Thrush (oral) Yeast vaginitis    Increased sweating  Depression Increased blood glucose levels Cramping and leg/calf  Euphoria (feeling happy)  POSSIBLE PROCEDURE SIDE EFFECTS: The side effects of the injection are usually fairly minimal however if you may experience some of the following side effects that are usually self-limited and will is off on their own.  If you are concerned please feel free to call the office with questions:  Increased numbness or tingling  Nausea or vomiting  Swelling or bruising at the injection site   Please call our office if if you experience any of the following symptoms over the next week as these can be signs of infection:   Fever greater than 100.49F  Significant swelling at the injection site  Significant redness or drainage  from the injection site  If after 2 weeks you are continuing to have worsening symptoms please call our office to discuss what the next appropriate actions should be including the potential for a return office visit or other diagnostic testing.   We are ordering an MRI for you today.  The imaging office will be calling you to schedule your appointment after we obtain authorization from your insurance company.   Please be sure you have signed up for MyChart so that we can get your results to you.  We will be in touch with you as soon as we can.  Please know, it can take up to 3-4 business days for the radiologist and Dr. Paulla Fore to have time to review the results and determine the best appropriate action.  If there is something that appears to be surgical or needs a referral to other specialists we will let you know through [place] or telephone.  Otherwise we will plan to schedule a follow up appointment with Dr. Paulla Fore once we have the results.

## 2018-05-18 LAB — SYNOVIAL CELL COUNT + DIFF, W/ CRYSTALS
BASOPHILS, %: 0 %
EOSINOPHILS-SYNOVIAL: 0 % (ref 0–2)
LYMPHOCYTES-SYNOVIAL FLD: 49 % (ref 0–74)
Monocyte/Macrophage: 40 % (ref 0–69)
Neutrophil, Synovial: 11 % (ref 0–24)
Synoviocytes, %: 0 % (ref 0–15)
WBC, Synovial: 1015 cells/uL — ABNORMAL HIGH (ref <150)

## 2018-05-19 ENCOUNTER — Telehealth: Payer: Self-pay

## 2018-05-19 NOTE — Telephone Encounter (Signed)
Called pt and left VM to call the office.  

## 2018-05-19 NOTE — Telephone Encounter (Signed)
PA for Celebrex initiated via [email or web address]  PA has been denied.   Alternatives include IBU, Meloxicam, Naproxen, Diclofenac, and Indomethacin.   Per Dr. Paulla Fore, may send to [facility] and can get rx for around $15 with GoodRx card.

## 2018-05-19 NOTE — Telephone Encounter (Signed)
Pt returned vm for [place] but she as unavailable. Please contact pt again when applicable.

## 2018-05-19 NOTE — Telephone Encounter (Signed)
Spoke with pt, she said that she will just stick with Motrin for now. No further action required.

## 2018-05-21 ENCOUNTER — Ambulatory Visit
Admission: RE | Admit: 2018-05-21 | Discharge: 2018-05-21 | Disposition: A | Discharge status: Home / Self Care | Payer: Medicare Other | Source: Ambulatory Visit | Attending: Sports Medicine | Admitting: Sports Medicine

## 2018-05-21 ENCOUNTER — Other Ambulatory Visit: Payer: Medicare Other

## 2018-05-21 DIAGNOSIS — M25562 Pain in left knee: Principal | ICD-10-CM

## 2018-05-21 DIAGNOSIS — M23362 Other meniscus derangements, other lateral meniscus, left knee: Secondary | ICD-10-CM | POA: Diagnosis not present

## 2018-05-21 DIAGNOSIS — G8929 Other chronic pain: Secondary | ICD-10-CM

## 2018-05-24 ENCOUNTER — Other Ambulatory Visit: Payer: Self-pay

## 2018-05-24 ENCOUNTER — Encounter: Payer: Self-pay | Admitting: Sports Medicine

## 2018-05-24 DIAGNOSIS — G8929 Other chronic pain: Secondary | ICD-10-CM

## 2018-05-24 DIAGNOSIS — M7122 Synovial cyst of popliteal space [Baker], left knee: Secondary | ICD-10-CM

## 2018-05-24 DIAGNOSIS — S83242D Other tear of medial meniscus, current injury, left knee, subsequent encounter: Secondary | ICD-10-CM

## 2018-05-24 DIAGNOSIS — S83282D Other tear of lateral meniscus, current injury, left knee, subsequent encounter: Secondary | ICD-10-CM

## 2018-05-24 DIAGNOSIS — M25562 Pain in left knee: Secondary | ICD-10-CM

## 2018-05-25 ENCOUNTER — Other Ambulatory Visit (INDEPENDENT_AMBULATORY_CARE_PROVIDER_SITE_OTHER): Payer: Self-pay

## 2018-05-25 ENCOUNTER — Ambulatory Visit (INDEPENDENT_AMBULATORY_CARE_PROVIDER_SITE_OTHER): Payer: Medicare Other | Admitting: Orthopedic Surgery

## 2018-05-25 DIAGNOSIS — M25562 Pain in left knee: Secondary | ICD-10-CM

## 2018-05-25 DIAGNOSIS — S838X2A Sprain of other specified parts of left knee, initial encounter: Secondary | ICD-10-CM | POA: Diagnosis not present

## 2018-05-25 DIAGNOSIS — Z9189 Other specified personal risk factors, not elsewhere classified: Secondary | ICD-10-CM

## 2018-05-25 DIAGNOSIS — Z1159 Encounter for screening for other viral diseases: Secondary | ICD-10-CM | POA: Diagnosis not present

## 2018-05-27 LAB — CBC WITH DIFFERENTIAL/PLATELET
ABSOLUTE MONOCYTES: 684 {cells}/uL (ref 200–950)
BASOS ABS: 54 {cells}/uL (ref 0–200)
Basophils Relative: 0.6 %
Eosinophils Absolute: 108 cells/uL (ref 15–500)
Eosinophils Relative: 1.2 %
HEMATOCRIT: 37.8 % (ref 35.0–45.0)
Hemoglobin: 12.7 g/dL (ref 11.7–15.5)
LYMPHS ABS: 1386 {cells}/uL (ref 850–3900)
MCH: 30.1 pg (ref 27.0–33.0)
MCHC: 33.6 g/dL (ref 32.0–36.0)
MCV: 89.6 fL (ref 80.0–100.0)
MPV: 9 fL (ref 7.5–12.5)
Monocytes Relative: 7.6 %
NEUTROS PCT: 75.2 %
Neutro Abs: 6768 cells/uL (ref 1500–7800)
Platelets: 363 10*3/uL (ref 140–400)
RBC: 4.22 10*6/uL (ref 3.80–5.10)
RDW: 11.8 % (ref 11.0–15.0)
Total Lymphocyte: 15.4 %
WBC: 9 10*3/uL (ref 3.8–10.8)

## 2018-05-27 LAB — SEDIMENTATION RATE: Sed Rate: 36 mm/h — ABNORMAL HIGH (ref 0–30)

## 2018-05-27 LAB — ANTI-NUCLEAR AB-TITER (ANA TITER): ANA Titer 1: 1:40 {titer} — ABNORMAL HIGH

## 2018-05-27 LAB — RHEUMATOID FACTOR: Rhuematoid fact SerPl-aCnc: <14 IU/mL (ref <14)

## 2018-05-27 LAB — ANA: Anti Nuclear Antibody(ANA): POSITIVE — AB

## 2018-05-27 LAB — URIC ACID: Uric Acid, Serum: 4.5 mg/dL (ref 2.5–7.0)

## 2018-05-27 LAB — CYCLIC CITRUL PEPTIDE ANTIBODY, IGG

## 2018-05-27 LAB — C-REACTIVE PROTEIN: CRP: 15.5 mg/L — ABNORMAL HIGH (ref <8.0)

## 2018-05-30 ENCOUNTER — Encounter (INDEPENDENT_AMBULATORY_CARE_PROVIDER_SITE_OTHER): Payer: Self-pay | Admitting: Orthopedic Surgery

## 2018-05-30 NOTE — Progress Notes (Signed)
Office Visit Note   Patient: Evelyn Jordan           Date of Birth: [birth year]           MRN: [id number] Visit Date: 05/25/2018 Requested by: Briscoe Deutscher, [address] [place], [place] [address] PCP: Briscoe Deutscher, DO  Subjective: Chief Complaint  Patient presents with  . Left Knee - Pain    HPI: Patient presents for evaluation of left knee pain.  She describes 2-1/8-monthhistory of left knee pain which is predominantly on the medial aspect of the knee.  She is had the knee aspirated and injected x2.  Crystal analysis negative on both of lesions.  She states is painful and hard for her to bend that knee.  The shot did help her for about 48 hours.  She has been taking ibuprofen.  The pain will wake her from sleep at night.  She is not able to do yoga or pickleball.  MRI scan has been performed.  It shows medial and lateral meniscal tears which appear to be potentially degenerative in nature.  She also has a small split tear of the distal biceps for Morris attachment site but is not clinically symptomatic in this area.  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  She denies any other joint complaints and denies any swelling or warmth in any of her other joints.  No personal or family history of gout rheumatoid arthritis lupus or other types of arthropathy.              ROS: All systems reviewed are negative as they relate to the chief complaint within the history of present illness.  Patient denies  fevers or chills.   Assessment & Plan: Visit Diagnoses:  1. Injury of meniscus of left knee, initial encounter   2. Left knee pain, unspecified chronicity   3. Encounter for hepatitis C virus screening test for high risk patient     Plan: Impression is left knee effusion and synovitis with medial and lateral meniscal tears on MRI scan.  No definite mechanical symptoms but she is having predominantly medial sided  pain.  Denies any real groin pain and has no evidence of referred pain from the back or the hip at this time.  Discussed operative and nonoperative treatment for this problem.  Essentially she is had 2-1/2 months of symptoms with no sustained relief from 2 injections.  I think arthroscopy may benefit her to some degree.  I would like to draw some labs to rule out any type of autoimmune type of arthritis.  To that end we will draw uric acid CBC differential sed rate C-reactive protein and ANA rheumatoid factor and anti-CCP antibody.  We will also tentatively put her on the schedule for knee arthroscopy with meniscal debridement.  Risk and benefits are discussed including but not limited to infection knee stiffness as well as potential incomplete pain relief.  Patient understands the risk and benefits and wishes to proceed.  All questions answered. At the time of this dictation rheumatoid factor was negative.  Anti-CCP antibody negative.  Serum uric acid also normal.  She did have slight elevation of sed rate at 36 and slight elevation of CRP at 15.  ANA was only weakly positive at a titer of 1-40.  At this time there is no real compelling laboratory evidence clinical exam evidence of any type of  autoimmune arthropathy.  I think it is reasonable to proceed with arthroscopy at this time.  I will convey the results to her prior to surgery. Follow-Up Instructions: No follow-ups on file.   Orders:  Orders Placed This Encounter  Procedures  . Antinuclear Antib (ANA)  . CBC w/Diff/Platelet  . Sed Rate (ESR)  . C-reactive protein  . Cyclic citrul peptide antibody, IgG  . Uric acid  . Rheumatoid Factor  . Anti-nuclear ab-titer (ANA titer)   No orders of the defined types were placed in this encounter.     Procedures: No procedures performed   Clinical Data: No additional findings.  Objective: Vital Signs: There were no vitals taken for this visit.  Physical Exam:   Constitutional: Patient  appears well-developed HEENT:  Head: Normocephalic Eyes:EOM are normal Neck: Normal range of motion Cardiovascular: Normal rate Pulmonary/chest: Effort normal Neurologic: Patient is alert Skin: Skin is warm Psychiatric: Patient has normal mood and affect    Ortho Exam: Ortho exam demonstrates pretty normal gait alignment but with left knee effusion present.  Collateral crucial ligaments are stable.  Medial joint line tenderness is present.  Femur compression testing is positive for medial compartment pathology.  No groin pain with internal or external rotation of that left leg and right leg.  No real warmth to the right knee or left knee.  Effusion is present.  No nerve root tension signs on the left.  No calf tenderness on the left right-hand side with negative Homans.  Specialty Comments:  No specialty comments available.  Imaging: No results found.   PMFS History: Patient Active Problem List   Diagnosis Date Noted  . 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 Osteopenia 12/23/2017  . Primary insomnia 12/04/2017   History reviewed. No pertinent past medical history.  Family History  Problem Relation Age of Onset  . Alzheimer's disease Mother   . Hypertension Father   . Stroke Father     Past Surgical History:  Procedure Laterality Date  . BREAST BIOPSY Right    benign   Social History   Occupational History    Comment: retired Marine scientist   Tobacco Use  . Smoking status: Former Research scientist (life sciences)  . Smokeless tobacco: Never Used  . Tobacco comment: quit in 2011  Substance and Sexual Activity  . Alcohol use: Yes    Comment: Rarely  . Drug use: No  . Sexual activity: Not on file

## 2018-05-31 ENCOUNTER — Encounter: Payer: Self-pay | Admitting: Orthopedic Surgery

## 2018-05-31 DIAGNOSIS — G8918 Other acute postprocedural pain: Secondary | ICD-10-CM | POA: Diagnosis not present

## 2018-05-31 DIAGNOSIS — M23332 Other meniscus derangements, other medial meniscus, left knee: Secondary | ICD-10-CM | POA: Diagnosis not present

## 2018-05-31 DIAGNOSIS — M23322 Other meniscus derangements, posterior horn of medial meniscus, left knee: Secondary | ICD-10-CM | POA: Diagnosis not present

## 2018-05-31 DIAGNOSIS — M23342 Other meniscus derangements, anterior horn of lateral meniscus, left knee: Secondary | ICD-10-CM | POA: Diagnosis not present

## 2018-06-06 ENCOUNTER — Encounter (INDEPENDENT_AMBULATORY_CARE_PROVIDER_SITE_OTHER): Payer: Self-pay | Admitting: Orthopedic Surgery

## 2018-06-08 NOTE — Telephone Encounter (Signed)
All she describes is to be expected needs to work on full extension

## 2018-06-10 ENCOUNTER — Encounter (INDEPENDENT_AMBULATORY_CARE_PROVIDER_SITE_OTHER): Payer: Self-pay | Admitting: Orthopedic Surgery

## 2018-06-10 ENCOUNTER — Ambulatory Visit (INDEPENDENT_AMBULATORY_CARE_PROVIDER_SITE_OTHER): Payer: Medicare Other | Admitting: Orthopedic Surgery

## 2018-06-10 DIAGNOSIS — S838X2A Sprain of other specified parts of left knee, initial encounter: Secondary | ICD-10-CM

## 2018-06-10 MED ORDER — IBUPROFEN 800 MG PO TABS: 800.0000 mg | ORAL_TABLET | Freq: Every day | ORAL | 0 refills | Status: AC

## 2018-06-10 NOTE — Progress Notes (Signed)
   Post-Op Visit Note   Patient: Evelyn Jordan           Date of Birth: 11-Jan-1951           MRN: [id number] Visit Date: 06/10/2018 PCP: Briscoe Deutscher, DO   Assessment & Plan:  Chief Complaint:  Chief Complaint  Patient presents with  . Left Knee - Routine Post Op   Visit Diagnoses:  1. Injury of meniscus of left knee, initial encounter     Plan: Patient presents now about 10 days out left knee arthroscopy.  On exam she has mild effusion with a palpable Baker's cyst.  No calf tenderness.  Range of motion is excellent.  I will have her go with ibuprofen 1 a day for the next 30 days.  Okay to start leg extension and stationary bike.  May consider aspiration of the cyst in the knee if her swelling persists.  She did have some meniscal pathology in that left knee.  Her other laboratory values were normal except for a slightly elevated CRP and sed rate.  ANA titer was 06/10/1938 which is weakly positive.  No other joint complaints at this time.  Follow-Up Instructions: Return in about 4 weeks (around 07/08/2018).   Orders:  No orders of the defined types were placed in this encounter.  No orders of the defined types were placed in this encounter.   Imaging: No results found.  PMFS History: Patient Active Problem List   Diagnosis Date Noted  . 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 Osteopenia 12/23/2017  . Primary insomnia 12/04/2017   History reviewed. No pertinent past medical history.  Family History  Problem Relation Age of Onset  . Alzheimer's disease Mother   . Hypertension Father   . Stroke Father     Past Surgical History:  Procedure Laterality Date  . BREAST BIOPSY Right    benign   Social History   Occupational History    Comment: retired Marine scientist   Tobacco Use  . Smoking status: Former Research scientist (life sciences)  . Smokeless tobacco: Never Used  . Tobacco comment: quit in 2011  Substance and Sexual Activity  . Alcohol use: Yes    Comment: Rarely  . Drug use: No  .  Sexual activity: Not on file

## 2018-06-10 NOTE — Addendum Note (Signed)
Addended by: Marcene Duos on: 06/10/2018 12:20 PM   Modules accepted: Orders

## 2018-06-14 ENCOUNTER — Encounter: Payer: Self-pay | Admitting: Family Medicine

## 2018-06-14 ENCOUNTER — Encounter (INDEPENDENT_AMBULATORY_CARE_PROVIDER_SITE_OTHER): Payer: Self-pay | Admitting: Orthopedic Surgery

## 2018-06-14 NOTE — Telephone Encounter (Signed)
No can come in before  - this week ok for US guided asp

## 2018-06-15 ENCOUNTER — Encounter (INDEPENDENT_AMBULATORY_CARE_PROVIDER_SITE_OTHER): Payer: Self-pay | Admitting: Orthopedic Surgery

## 2018-06-16 ENCOUNTER — Ambulatory Visit (INDEPENDENT_AMBULATORY_CARE_PROVIDER_SITE_OTHER): Payer: Medicare Other | Admitting: Orthopedic Surgery

## 2018-06-16 ENCOUNTER — Encounter (INDEPENDENT_AMBULATORY_CARE_PROVIDER_SITE_OTHER): Payer: Self-pay | Admitting: Orthopedic Surgery

## 2018-06-16 DIAGNOSIS — S838X2A Sprain of other specified parts of left knee, initial encounter: Secondary | ICD-10-CM

## 2018-06-16 NOTE — Progress Notes (Signed)
   Post-Op Visit Note   Patient: Evelyn Jordan           Date of Birth: 12-07-1950           MRN: [id number] Visit Date: 06/16/2018 PCP: Briscoe Deutscher, DO   Assessment & Plan:  Chief Complaint:  Chief Complaint  Patient presents with  . Left Knee - Follow-up   Visit Diagnoses:  1. Injury of meniscus of left knee, initial encounter     Plan: Patient presents now about 2 weeks out left knee arthroscopy.  She is got a symptomatic Baker's cyst.  Looked at it under ultrasound and it is septated with some fluid.  Appears to be chronic.  Does not appear to be leaking.  She also has a mild effusion in the knee itself.  Range of motion is excellent.  I will aspirate that knee and got about 25 cc out.  Also aspirated the Baker's cyst and got only about 5 cc out.  We will see how she does with that and see her back at the end of January.  No calf tenderness today.  She may elect to have that cyst surgically excised but I would not advise that yet until we see how she does symptomatically over the next few months  Follow-Up Instructions: No follow-ups on file.   Orders:  No orders of the defined types were placed in this encounter.  No orders of the defined types were placed in this encounter.   Imaging: No results found.  PMFS History: Patient Active Problem List   Diagnosis Date Noted  . 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 Osteopenia 12/23/2017  . Primary insomnia 12/04/2017   History reviewed. No pertinent past medical history.  Family History  Problem Relation Age of Onset  . Alzheimer's disease Mother   . Hypertension Father   . Stroke Father     Past Surgical History:  Procedure Laterality Date  . BREAST BIOPSY Right    benign   Social History   Occupational History    Comment: retired Marine scientist   Tobacco Use  . Smoking status: Former Research scientist (life sciences)  . Smokeless tobacco: Never Used  . Tobacco comment: quit in 2011  Substance and Sexual Activity  . Alcohol use:  Yes    Comment: Rarely  . Drug use: No  . Sexual activity: Not on file

## 2018-06-21 ENCOUNTER — Encounter: Payer: Self-pay | Admitting: Family Medicine

## 2018-06-21 DIAGNOSIS — F5101 Primary insomnia: Secondary | ICD-10-CM

## 2018-06-22 ENCOUNTER — Encounter: Payer: Self-pay | Admitting: Family Medicine

## 2018-06-23 MED ORDER — ZOLPIDEM TARTRATE 5 MG PO TABS: 5.0000 mg | ORAL_TABLET | Freq: Every evening | ORAL | 1 refills | Status: DC | PRN

## 2018-06-23 NOTE — Telephone Encounter (Signed)
Dr. Juleen China, pt would like a refill on her Zolpidem. Does she need an appointment?

## 2018-07-07 ENCOUNTER — Encounter (INDEPENDENT_AMBULATORY_CARE_PROVIDER_SITE_OTHER): Payer: Self-pay | Admitting: Orthopedic Surgery

## 2018-07-07 ENCOUNTER — Ambulatory Visit (INDEPENDENT_AMBULATORY_CARE_PROVIDER_SITE_OTHER): Payer: Medicare Other | Admitting: Orthopedic Surgery

## 2018-07-07 ENCOUNTER — Ambulatory Visit (INDEPENDENT_AMBULATORY_CARE_PROVIDER_SITE_OTHER): Payer: Medicare Other

## 2018-07-07 ENCOUNTER — Encounter: Payer: Self-pay | Admitting: Family Medicine

## 2018-07-07 DIAGNOSIS — S838X2A Sprain of other specified parts of left knee, initial encounter: Secondary | ICD-10-CM

## 2018-07-07 DIAGNOSIS — Z23 Encounter for immunization: Secondary | ICD-10-CM | POA: Diagnosis not present

## 2018-07-07 MED ORDER — OXYCODONE-ACETAMINOPHEN 5-325 MG PO TABS: 1.0000 | ORAL_TABLET | Freq: Three times a day (TID) | ORAL | 0 refills | Status: DC | PRN

## 2018-07-07 NOTE — Addendum Note (Signed)
Addended by: Precious Bard on: 07/07/2018 09:06 AM   Modules accepted: Orders

## 2018-07-07 NOTE — Progress Notes (Signed)
   Post-Op Visit Note   Patient: Evelyn Jordan           Date of Birth: 09/10/1950           MRN: [id number] Visit Date: 07/07/2018 PCP: Briscoe Deutscher, DO   Assessment & Plan:  Chief Complaint:  Chief Complaint  Patient presents with  . Left Knee - Pain, Routine Post Op, Follow-up   Visit Diagnoses:  1. Injury of meniscus of left knee, initial encounter     Plan: Patient is now 5 weeks out from left knee arthroscopy with partial medial meniscectomy and cyst decompression.  The knee and Baker's cyst was aspirated last clinic visit.  Today she has less fluid in the knee and the cyst which is smaller.  Still bothers her when she is doing yoga.  I am going to aspirate the knee today and inject cortisone.  Come back in 6 weeks and will decide then for or against cyst removal.  Follow-Up Instructions: Return in about 6 weeks (around 08/18/2018).   Orders:  No orders of the defined types were placed in this encounter.  No orders of the defined types were placed in this encounter.   Imaging: No results found.  PMFS History: Patient Active Problem List   Diagnosis Date Noted  . 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 Osteopenia 12/23/2017  . Primary insomnia 12/04/2017   History reviewed. No pertinent past medical history.  Family History  Problem Relation Age of Onset  . Alzheimer's disease Mother   . Hypertension Father   . Stroke Father     Past Surgical History:  Procedure Laterality Date  . BREAST BIOPSY Right    benign   Social History   Occupational History    Comment: retired Marine scientist   Tobacco Use  . Smoking status: Former Research scientist (life sciences)  . Smokeless tobacco: Never Used  . Tobacco comment: quit in 2011  Substance and Sexual Activity  . Alcohol use: Yes    Comment: Rarely  . Drug use: No  . Sexual activity: Not on file

## 2018-07-08 DIAGNOSIS — H40013 Open angle with borderline findings, low risk, bilateral: Secondary | ICD-10-CM | POA: Diagnosis not present

## 2018-07-08 DIAGNOSIS — H52221 Regular astigmatism, right eye: Secondary | ICD-10-CM | POA: Diagnosis not present

## 2018-07-08 DIAGNOSIS — H5213 Myopia, bilateral: Secondary | ICD-10-CM | POA: Diagnosis not present

## 2018-07-28 ENCOUNTER — Encounter (INDEPENDENT_AMBULATORY_CARE_PROVIDER_SITE_OTHER): Payer: Self-pay | Admitting: Orthopedic Surgery

## 2018-07-29 NOTE — Telephone Encounter (Signed)
Not too much to be concerned about.  Normal to have some popping especially after arthroscopy we can check it out when you come back for the next clinic visit.

## 2018-08-13 ENCOUNTER — Ambulatory Visit (INDEPENDENT_AMBULATORY_CARE_PROVIDER_SITE_OTHER): Payer: Medicare Other | Admitting: Orthopedic Surgery

## 2018-08-13 ENCOUNTER — Encounter (INDEPENDENT_AMBULATORY_CARE_PROVIDER_SITE_OTHER): Payer: Self-pay | Admitting: Orthopedic Surgery

## 2018-08-13 DIAGNOSIS — S838X2A Sprain of other specified parts of left knee, initial encounter: Secondary | ICD-10-CM

## 2018-08-13 NOTE — Progress Notes (Signed)
   Post-Op Visit Note   Patient: Evelyn Jordan           Date of Birth: 08/27/1950           MRN: [id number] Visit Date: 08/13/2018 PCP: Briscoe Deutscher, DO   Assessment & Plan:  Chief Complaint:  Chief Complaint  Patient presents with  . Left Knee - Follow-up   Visit Diagnoses:  1. Injury of meniscus of left knee, initial encounter     Plan: Patient presents now 3 months out left knee arthroscopy.  She has been doing well.  Knee aspirated and injected last clinic visit.  On exam there is no effusion in great range of motion.  I am going to have her continue doing pickleball and yoga but I do want her to start doing some stationary bike to get the leg stronger.  I think as the swelling in the knee Lemar [place] will the Baker's cyst size increase or decrease.  Follow-up with me as needed.  No indication for further intervention at this time  Follow-Up Instructions: Return if symptoms worsen or fail to improve.   Orders:  No orders of the defined types were placed in this encounter.  No orders of the defined types were placed in this encounter.   Imaging: No results found.  PMFS History: Patient Active Problem List   Diagnosis Date Noted  . 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 Osteopenia 12/23/2017  . Primary insomnia 12/04/2017   History reviewed. No pertinent past medical history.  Family History  Problem Relation Age of Onset  . Alzheimer's disease Mother   . Hypertension Father   . Stroke Father     Past Surgical History:  Procedure Laterality Date  . BREAST BIOPSY Right    benign   Social History   Occupational History    Comment: retired Marine scientist   Tobacco Use  . Smoking status: Former Research scientist (life sciences)  . Smokeless tobacco: Never Used  . Tobacco comment: quit in 2011  Substance and Sexual Activity  . Alcohol use: Yes    Comment: Rarely  . Drug use: No  . Sexual activity: Not on file

## 2018-08-18 ENCOUNTER — Ambulatory Visit (INDEPENDENT_AMBULATORY_CARE_PROVIDER_SITE_OTHER): Payer: Medicare Other | Admitting: Orthopedic Surgery

## 2018-08-23 ENCOUNTER — Telehealth (INDEPENDENT_AMBULATORY_CARE_PROVIDER_SITE_OTHER): Payer: Self-pay | Admitting: Orthopedic Surgery

## 2018-08-23 ENCOUNTER — Encounter (INDEPENDENT_AMBULATORY_CARE_PROVIDER_SITE_OTHER): Payer: Self-pay | Admitting: Orthopedic Surgery

## 2018-08-23 NOTE — Telephone Encounter (Signed)
Mailed medical records release form per patient request

## 2018-08-24 ENCOUNTER — Encounter (INDEPENDENT_AMBULATORY_CARE_PROVIDER_SITE_OTHER): Payer: Self-pay | Admitting: Orthopedic Surgery

## 2018-08-24 NOTE — Telephone Encounter (Signed)
Not sure why swelling but could asp and inject toradol if bothersome -

## 2018-08-24 NOTE — Telephone Encounter (Signed)
Thx will c her next week

## 2018-08-25 NOTE — Telephone Encounter (Signed)
Need to eval first

## 2018-09-01 ENCOUNTER — Ambulatory Visit (INDEPENDENT_AMBULATORY_CARE_PROVIDER_SITE_OTHER): Payer: Medicare Other | Admitting: Orthopedic Surgery

## 2018-09-09 ENCOUNTER — Encounter (INDEPENDENT_AMBULATORY_CARE_PROVIDER_SITE_OTHER): Payer: Self-pay | Admitting: Orthopedic Surgery

## 2018-09-10 NOTE — Telephone Encounter (Signed)
I would press on at this point if the cyst is not palpable that means is not large enough to be easily found surgically.  I think the swelling is just par for the course and I do not think there is anything inside the joint that we can treat to keep the knee from swelling.  I think the stationary bike is a good idea and if necessary taking anti-inflammatories will help with some of the swelling.

## 2018-09-29 ENCOUNTER — Encounter: Payer: Self-pay | Admitting: Family Medicine

## 2018-09-29 NOTE — Telephone Encounter (Signed)
We are holding off on immunizations right?

## 2018-10-19 ENCOUNTER — Other Ambulatory Visit: Payer: Self-pay

## 2018-10-19 ENCOUNTER — Ambulatory Visit (INDEPENDENT_AMBULATORY_CARE_PROVIDER_SITE_OTHER): Payer: Medicare Other | Admitting: Family Medicine

## 2018-10-19 ENCOUNTER — Encounter: Payer: Self-pay | Admitting: Family Medicine

## 2018-10-19 DIAGNOSIS — Z23 Encounter for immunization: Secondary | ICD-10-CM

## 2018-10-19 NOTE — Progress Notes (Signed)
Shingrix given IM left deltoid, pt tolerated well

## 2018-10-22 ENCOUNTER — Telehealth: Payer: Medicare Other | Admitting: Family Medicine

## 2018-11-05 ENCOUNTER — Encounter: Payer: Self-pay | Admitting: Family Medicine

## 2018-11-08 ENCOUNTER — Encounter: Payer: Self-pay | Admitting: Orthopedic Surgery

## 2018-11-09 ENCOUNTER — Encounter: Payer: Self-pay | Admitting: Family Medicine

## 2018-11-09 NOTE — Telephone Encounter (Signed)
Most of these things plateau after a year.  I would not really do anything else until that time at least.  Not too surprising that you have a little bit of anterior pain swelling and stiffness but there is nothing easy really to rectify that and I think getting the leg stronger is the way to go.  Try coming in in 3 months so we can recheck it.

## 2018-11-10 NOTE — Telephone Encounter (Signed)
Called pt and scheduled in office visit for this coming Tuesday with Dr. Juleen China.

## 2018-11-16 ENCOUNTER — Ambulatory Visit: Payer: Medicare Other | Admitting: Family Medicine

## 2018-11-20 ENCOUNTER — Encounter: Payer: Self-pay | Admitting: Family Medicine

## 2018-11-28 NOTE — Progress Notes (Signed)
Virtual Visit via Video   Due to the COVID-19 pandemic, this visit was completed with telemedicine (audio/video) technology to reduce patient and provider exposure as well as to preserve personal protective equipment.   I connected with [name] by a video enabled telemedicine application and verified that I am speaking with the correct person using two identifiers. Location patient: Home Location provider: [facility], Office Persons participating in the virtual visit: Mushka, Laconte, DO   I discussed the limitations of evaluation and management by telemedicine and the availability of in person appointments. The patient expressed understanding and agreed to proceed.  Care Team   Patient Care Team: Briscoe Deutscher, DO as PCP - General (Family Medicine)  Subjective:   HPI: Patient has had increased anxiety due to Covid. She has had some increased issues with family. She was having a VERY hard time two weeks ago and took one of daughters xanax and had tremendous improvement.   Phone note from 11/20/18: I have noticed an increase in my restlessness, short temper and anxiety. I have not been able to travel, volunteer with Hospice or play Pickleball due to COVID 19. My family has also commented on my lack of patience and tolerance. My daughter suggested low dose Xanax may help with my recent increase in anxiety. Could you please advise and subscribe? Thank you  Phone note from 11/09/18: I have hemorrhoid that bleeds Occasionally when I have a bowel movement. Who would you suggest to have it checked out or is it better to leave untreated unless it gets worse?  Only medications that patient is currently taking is for Insomnia. She currently has Ambien 5mg .   Review of Systems  Constitutional: Negative for chills and fever.  HENT: Negative for hearing loss.   Eyes: Negative for blurred vision and double vision.  Respiratory: Negative for cough and wheezing.     Cardiovascular: Negative for chest pain, palpitations and leg swelling.  Gastrointestinal: Negative for nausea and vomiting.  Genitourinary: Negative for dysuria, frequency and urgency.  Musculoskeletal: Negative for myalgias.  Neurological: Negative for dizziness and headaches.  Psychiatric/Behavioral: Negative for depression and suicidal ideas.    Patient Active Problem List   Diagnosis Date Noted  . 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 Osteopenia 12/23/2017  . Primary insomnia 12/04/2017    Social History   Tobacco Use  . Smoking status: Former Research scientist (life sciences)  . Smokeless tobacco: Never Used  . Tobacco comment: quit in 2011  Substance Use Topics  . Alcohol use: Yes    Comment: Rarely    Current Outpatient Medications:  .  zolpidem (AMBIEN) 5 MG tablet, Take 1 tablet (5 mg total) by mouth at bedtime as needed for sleep., Disp: 15 tablet, Rfl: 1 .  ALPRAZolam (XANAX) 0.5 MG tablet, Take 1 tablet (0.5 mg total) by mouth daily as needed for anxiety., Disp: 30 tablet, Rfl: 0  Allergies  Allergen Reactions  . Compazine [Prochlorperazine Edisylate]     Neck hyperextends   Objective:   VITALS: Per patient if applicable, see vitals. GENERAL: Alert, appears well and in no acute distress. HEENT: Atraumatic, conjunctiva clear, no obvious abnormalities on inspection of external nose and ears. NECK: Normal movements of the head and neck. CARDIOPULMONARY: No increased WOB. Speaking in clear sentences. I:E ratio WNL.  MS: Moves all visible extremities without noticeable abnormality. PSYCH: Pleasant and cooperative, well-groomed. Speech normal rate and rhythm. Affect is appropriate. Insight and judgement are appropriate. Attention is focused,  linear, and appropriate.  NEURO: CN grossly intact. Oriented as arrived to appointment on time with no prompting. Moves both UE equally.  SKIN: No obvious lesions, wounds, erythema, or cyanosis noted on face or hands.  Depression screen [facility]  2/9 11/29/2018 12/02/2017 10/06/2016  Decreased Interest 0 0 0  Down, Depressed, Hopeless 1 0 0  PHQ - 2 Score 1 0 0  Altered sleeping 1 0 -  Tired, decreased energy 0 1 -  Change in appetite 0 0 -  Feeling bad or failure about yourself  0 0 -  Trouble concentrating 0 0 -  Moving slowly or fidgety/restless 0 0 -  Suicidal thoughts 0 0 -  PHQ-9 Score 2 1 -  Difficult doing work/chores Not difficult at all Not difficult at all -    Assessment and Plan:   Guneet was seen today for anxiety.  Diagnoses and all orders for this visit:  Primary insomnia -     zolpidem (AMBIEN) 5 MG tablet; Take 1 tablet (5 mg total) by mouth at bedtime as needed for sleep.  Situational anxiety Comments: Okay to use low dose sparingly. Discussed therapy and setting boundaries. Offered daily medication if worsens. Orders: -     ALPRAZolam (XANAX) 0.5 MG tablet; Take 1 tablet (0.5 mg total) by mouth daily as needed for anxiety.   Marland Kitchen COVID-19 Education: The signs and symptoms of COVID-19 were discussed with the patient and how to seek care for testing if needed. The importance of social distancing was discussed today. . Reviewed expectations re: course of current medical issues. . Discussed self-management of symptoms. . Outlined signs and symptoms indicating need for more acute intervention. . Patient verbalized understanding and all questions were answered. Marland Kitchen Health Maintenance issues including appropriate healthy diet, exercise, and smoking avoidance were discussed with patient. . See orders for this visit as documented in the electronic medical record.  Briscoe Deutscher, DO  Records requested if needed. Time spent: 25 minutes, of which >50% was spent in obtaining information about her symptoms, reviewing her previous labs, evaluations, and treatments, counseling her about her condition (please see the discussed topics above), and developing a plan to further investigate it; she had a number of questions  which I addressed.

## 2018-11-29 ENCOUNTER — Encounter: Payer: Self-pay | Admitting: Family Medicine

## 2018-11-29 ENCOUNTER — Ambulatory Visit (INDEPENDENT_AMBULATORY_CARE_PROVIDER_SITE_OTHER): Payer: Medicare Other | Admitting: Family Medicine

## 2018-11-29 ENCOUNTER — Other Ambulatory Visit: Payer: Self-pay

## 2018-11-29 VITALS — Ht 63.25 in | Wt 121.0 lb

## 2018-11-29 DIAGNOSIS — F5101 Primary insomnia: Secondary | ICD-10-CM | POA: Diagnosis not present

## 2018-11-29 DIAGNOSIS — F418 Other specified anxiety disorders: Secondary | ICD-10-CM | POA: Diagnosis not present

## 2018-11-29 MED ORDER — ALPRAZOLAM 0.5 MG PO TABS: 0.5000 mg | ORAL_TABLET | Freq: Every day | ORAL | 0 refills | Status: DC | PRN

## 2018-11-29 MED ORDER — ZOLPIDEM TARTRATE 5 MG PO TABS: 5.0000 mg | ORAL_TABLET | Freq: Every evening | ORAL | 1 refills | Status: DC | PRN

## 2018-12-23 DIAGNOSIS — H40013 Open angle with borderline findings, low risk, bilateral: Secondary | ICD-10-CM | POA: Diagnosis not present

## 2018-12-23 DIAGNOSIS — Z83511 Family history of glaucoma: Secondary | ICD-10-CM | POA: Diagnosis not present

## 2018-12-29 ENCOUNTER — Other Ambulatory Visit: Payer: Self-pay | Admitting: Family Medicine

## 2018-12-29 DIAGNOSIS — F418 Other specified anxiety disorders: Secondary | ICD-10-CM

## 2018-12-30 MED ORDER — ALPRAZOLAM 0.5 MG PO TABS: 0.5000 mg | ORAL_TABLET | Freq: Every day | ORAL | 0 refills | Status: DC | PRN

## 2018-12-30 NOTE — Telephone Encounter (Signed)
Pt requesting refill on Alprazolam 0.5 mg, Last OV 11/29/2018. Last Rx 11/29/18, # 30, refill 0

## 2019-01-05 ENCOUNTER — Other Ambulatory Visit: Payer: Self-pay | Admitting: Family Medicine

## 2019-01-05 DIAGNOSIS — F5101 Primary insomnia: Secondary | ICD-10-CM

## 2019-01-05 DIAGNOSIS — F418 Other specified anxiety disorders: Secondary | ICD-10-CM

## 2019-01-05 NOTE — Telephone Encounter (Signed)
Last fill Zolpidem  11/29/18  #15/1 Last fill Alprazolam  12/30/18  #30/0 Last OV 11/29/18

## 2019-03-01 ENCOUNTER — Other Ambulatory Visit (HOSPITAL_BASED_OUTPATIENT_CLINIC_OR_DEPARTMENT_OTHER): Payer: Self-pay | Admitting: Family Medicine

## 2019-03-01 DIAGNOSIS — Z1231 Encounter for screening mammogram for malignant neoplasm of breast: Secondary | ICD-10-CM

## 2019-03-04 ENCOUNTER — Other Ambulatory Visit: Payer: Self-pay

## 2019-03-04 ENCOUNTER — Ambulatory Visit (HOSPITAL_BASED_OUTPATIENT_CLINIC_OR_DEPARTMENT_OTHER)
Admission: RE | Admit: 2019-03-04 | Discharge: 2019-03-04 | Disposition: A | Discharge status: Home / Self Care | Payer: Medicare Other | Source: Ambulatory Visit | Attending: Family Medicine | Admitting: Family Medicine

## 2019-03-04 DIAGNOSIS — Z1231 Encounter for screening mammogram for malignant neoplasm of breast: Secondary | ICD-10-CM | POA: Diagnosis not present

## 2019-03-07 ENCOUNTER — Other Ambulatory Visit: Payer: Self-pay

## 2019-03-07 ENCOUNTER — Ambulatory Visit (INDEPENDENT_AMBULATORY_CARE_PROVIDER_SITE_OTHER): Payer: Medicare Other

## 2019-03-07 VITALS — BP 126/70 | Temp 98.5°F | Ht 63.0 in | Wt 120.6 lb

## 2019-03-07 DIAGNOSIS — Z0000 Encounter for general adult medical examination without abnormal findings: Secondary | ICD-10-CM

## 2019-03-07 DIAGNOSIS — Z23 Encounter for immunization: Secondary | ICD-10-CM | POA: Diagnosis not present

## 2019-03-07 NOTE — Patient Instructions (Signed)
Evelyn Jordan , Thank you for taking time to come for your Medicare Wellness Visit. I appreciate your ongoing commitment to your health goals. Please review the following plan we discussed and let me know if I can assist you in the future.   Screening recommendations/referrals: Colorectal Screening: completed 08/14/14 Mammogram: completed 03/04/19 Bone Density: up to date last 12/17/17; due 12/2019  Vision and Dental Exams: Recommended annual ophthalmology exams for early detection of glaucoma and other disorders of the eye Recommended annual dental exams for proper oral hygiene  Vaccinations: Influenza vaccine: today  Pneumococcal vaccine: up to date; last 12/02/17 Tdap vaccine: up to date; last 12/23/17;  Shingles vaccine: Please call your insurance company to determine your out of pocket expense for the Shingrix vaccine. You may receive this vaccine at your local pharmacy.  Advanced directives: We have received a copy of your POA (Power of [place]) and/or Living Will. These documents can be located in your chart.  Goals: Recommend to exercise for at least 150 minutes per week.  Next appointment: Please schedule your Annual Wellness Visit with your Nurse Health Advisor in one year.  Preventive Care 43 Years and Older, Female Preventive care refers to lifestyle choices and visits with your health care provider that can promote health and wellness. What does preventive care include?  A yearly physical exam. This is also called an annual well check.  Dental exams once or twice a year.  Routine eye exams. Ask your health care provider how often you should have your eyes checked.  Personal lifestyle choices, including:  Daily care of your teeth and gums.  Regular physical activity.  Eating a healthy diet.  Avoiding tobacco and drug use.  Limiting alcohol use.  Practicing safe sex.  Taking low-dose aspirin every day if recommended by your health care provider.  Taking vitamin and  mineral supplements as recommended by your health care provider. What happens during an annual well check? The services and screenings done by your health care provider during your annual well check will depend on your age, overall health, lifestyle risk factors, and family history of disease. Counseling  Your health care provider may ask you questions about your:  Alcohol use.  Tobacco use.  Drug use.  Emotional well-being.  Home and relationship well-being.  Sexual activity.  Eating habits.  History of falls.  Memory and ability to understand (cognition).  Work and work Statistician.  Reproductive health. Screening  You may have the following tests or measurements:  Height, weight, and BMI.  Blood pressure.  Lipid and cholesterol levels. These may be checked every 5 years, or more frequently if you are over 39 years old.  Skin check.  Lung cancer screening. You may have this screening every year starting at age 75 if you have a 30-pack-year history of smoking and currently smoke or have quit within the past 15 years.  Fecal occult blood test (FOBT) of the stool. You may have this test every year starting at age 57.  Flexible sigmoidoscopy or colonoscopy. You may have a sigmoidoscopy every 5 years or a colonoscopy every 10 years starting at age 64.  Hepatitis C blood test.  Hepatitis B blood test.  Sexually transmitted disease (STD) testing.  Diabetes screening. This is done by checking your blood sugar (glucose) after you have not eaten for a while (fasting). You may have this done every 1-3 years.  Bone density scan. This is done to screen for osteoporosis. You may have this done starting at  age 78.  Mammogram. This may be done every 1-2 years. Talk to your health care provider about how often you should have regular mammograms. Talk with your health care provider about your test results, treatment options, and if necessary, the need for more tests. Vaccines   Your health care provider may recommend certain vaccines, such as:  Influenza vaccine. This is recommended every year.  Tetanus, diphtheria, and acellular pertussis (Tdap, Td) vaccine. You may need a Td booster every 10 years.  Zoster vaccine. You may need this after age 53.  Pneumococcal 13-valent conjugate (PCV13) vaccine. One dose is recommended after age 17.  Pneumococcal polysaccharide (PPSV23) vaccine. One dose is recommended after age 46. Talk to your health care provider about which screenings and vaccines you need and how often you need them. This information is not intended to replace advice given to you by your health care provider. Make sure you discuss any questions you have with your health care provider. Document Released: 06/22/2015 Document Revised: 02/13/2016 Document Reviewed: 03/27/2015 Elsevier Interactive Patient Education  2017 [place] Prevention in the Home Falls can cause injuries. They can happen to people of all ages. There are many things you can do to make your home safe and to help prevent falls. What can I do on the outside of my home?  Regularly fix the edges of walkways and driveways and fix any cracks.  Remove anything that might make you trip as you walk through a door, such as a raised step or threshold.  Trim any bushes or trees on the path to your home.  Use bright outdoor lighting.  Clear any walking paths of anything that might make someone trip, such as rocks or tools.  Regularly check to see if handrails are loose or broken. Make sure that both sides of any steps have handrails.  Any raised decks and porches should have guardrails on the edges.  Have any leaves, snow, or ice cleared regularly.  Use sand or salt on walking paths during winter.  Clean up any spills in your garage right away. This includes oil or grease spills. What can I do in the bathroom?  Use night lights.  Install grab bars by the toilet and in the  tub and shower. Do not use towel bars as grab bars.  Use non-skid mats or decals in the tub or shower.  If you need to sit down in the shower, use a plastic, non-slip stool.  Keep the floor dry. Clean up any water that spills on the floor as soon as it happens.  Remove soap buildup in the tub or shower regularly.  Attach bath mats securely with double-sided non-slip rug tape.  Do not have throw rugs and other things on the floor that can make you trip. What can I do in the bedroom?  Use night lights.  Make sure that you have a light by your bed that is easy to reach.  Do not use any sheets or blankets that are too big for your bed. They should not hang down onto the floor.  Have a firm chair that has side arms. You can use this for support while you get dressed.  Do not have throw rugs and other things on the floor that can make you trip. What can I do in the kitchen?  Clean up any spills right away.  Avoid walking on wet floors.  Keep items that you use a lot in easy-to-reach places.  If you  need to reach something above you, use a strong step stool that has a grab bar.  Keep electrical cords out of the way.  Do not use floor polish or wax that makes floors slippery. If you must use wax, use non-skid floor wax.  Do not have throw rugs and other things on the floor that can make you trip. What can I do with my stairs?  Do not leave any items on the stairs.  Make sure that there are handrails on both sides of the stairs and use them. Fix handrails that are broken or loose. Make sure that handrails are as long as the stairways.  Check any carpeting to make sure that it is firmly attached to the stairs. Fix any carpet that is loose or worn.  Avoid having throw rugs at the top or bottom of the stairs. If you do have throw rugs, attach them to the floor with carpet tape.  Make sure that you have a light switch at the top of the stairs and the bottom of the stairs. If you  do not have them, ask someone to add them for you. What else can I do to help prevent falls?  Wear shoes that:  Do not have high heels.  Have rubber bottoms.  Are comfortable and fit you well.  Are closed at the toe. Do not wear sandals.  If you use a stepladder:  Make sure that it is fully opened. Do not climb a closed stepladder.  Make sure that both sides of the stepladder are locked into place.  Ask someone to hold it for you, if possible.  Clearly mark and make sure that you can see:  Any grab bars or handrails.  First and last steps.  Where the edge of each step is.  Use tools that help you move around (mobility aids) if they are needed. These include:  Canes.  Walkers.  Scooters.  Crutches.  Turn on the lights when you go into a dark area. Replace any light bulbs as soon as they burn out.  Set up your furniture so you have a clear path. Avoid moving your furniture around.  If any of your floors are uneven, fix them.  If there are any pets around you, be aware of where they are.  Review your medicines with your doctor. Some medicines can make you feel dizzy. This can increase your chance of falling. Ask your doctor what other things that you can do to help prevent falls. This information is not intended to replace advice given to you by your health care provider. Make sure you discuss any questions you have with your health care provider. Document Released: 03/22/2009 Document Revised: 11/01/2015 Document Reviewed: 06/30/2014 Elsevier Interactive Patient Education  2017 [employer].

## 2019-03-07 NOTE — Progress Notes (Addendum)
Subjective:   Evelyn Jordan is a 68 y.o. female who presents for Medicare Annual (Subsequent) preventive examination.  Review of Systems:   Cardiac Risk Factors include: advanced age (>96mn, >>61women)     Objective:     Vitals: BP 126/70   Temp 98.5 F (36.9 C) (Temporal)   Ht _0  (1.6 m)   Wt 120 lb 9.6 oz (54.7 kg)   BMI 21.36 kg/m   Body mass index is 21.36 kg/m.  Advanced Directives 03/07/2019 12/02/2017  Does Patient Have a Medical Advance Directive? Yes No  Type of Advance Directive Living will;Healthcare Power of Attorney -  Does patient want to make changes to medical advance directive? No - Patient declined -  Copy of [place]? Yes - validated most recent copy scanned in chart (See row information) -  Would patient like information on creating a medical advance directive? - Yes (MAU/Ambulatory/Procedural Areas - Information given)    Tobacco Social History   Tobacco Use  Smoking Status Former Smoker  Smokeless Tobacco Never Used  Tobacco Comment   quit in 2011     Counseling given: Not Answered Comment: quit in 2011   Clinical Intake:  Pre-visit preparation completed: Yes  Pain : No/denies pain     Diabetes: No  How often do you need to have someone help you when you read instructions, pamphlets, or other written materials from your doctor or pharmacy?: 1 - Never  Interpreter Needed?: No  Information entered by :: CDenman GeorgeLPN  History reviewed. No pertinent past medical history. Past Surgical History:  Procedure Laterality Date  . BREAST BIOPSY Right    benign   Family History  Problem Relation Age of Onset  . Alzheimer's disease Mother   . Hypertension Father   . Stroke Father    Social History   Socioeconomic History  . Marital status: Married    Spouse name: Not on file  . Number of children: Not on file  . Years of education: Not on file  . Highest education level: Not on file   Occupational History    Comment: retired nMarine scientist  Social Needs  . Financial resource strain: Not on file  . Food insecurity    Worry: Not on file    Inability: Not on file  . Transportation needs    Medical: Not on file    Non-medical: Not on file  Tobacco Use  . Smoking status: Former SResearch scientist (life sciences) . Smokeless tobacco: Never Used  . Tobacco comment: quit in 2011  Substance and Sexual Activity  . Alcohol use: Yes    Comment: Rarely  . Drug use: No  . Sexual activity: Not on file  Lifestyle  . Physical activity    Days per week: Not on file    Minutes per session: Not on file  . Stress: Not on file  Relationships  . Social [place] phone: Not on file    Gets together: Not on file    Attends religious service: Not on file    Active member of club or organization: Not on file    Attends meetings of clubs or organizations: Not on file    Relationship status: Not on file  Other Topics Concern  . Not on file  Social History Narrative  . Not on file    Outpatient Encounter Medications as of 03/07/2019  Medication Sig  . ALPRAZolam (XANAX) 0.5 MG tablet  TAKE 1 TABLET(0.5 MG) BY MOUTH DAILY AS NEEDED FOR ANXIETY  . zolpidem (AMBIEN) 5 MG tablet TAKE 1 TABLET(5 MG) BY MOUTH AT BEDTIME AS NEEDED FOR SLEEP   No facility-administered encounter medications on file as of 03/07/2019.     Activities of Daily Living In your present state of health, do you have any difficulty performing the following activities: 03/07/2019  Hearing? N  Vision? N  Difficulty concentrating or making decisions? N  Walking or climbing stairs? N  Dressing or bathing? N  Doing errands, shopping? N  Preparing Food and eating ? N  Using the Toilet? N  In the past six months, have you accidently leaked urine? N  Do you have problems with loss of bowel control? N  Managing your Medications? N  Managing your Finances? N  Housekeeping or managing your Housekeeping? N  Some recent data might be  hidden    Patient Care Team: Briscoe Deutscher, DO as PCP - General (Family Medicine) Marlou Sa, Tonna Corner, MD as Consulting Physician (Orthopedic Surgery) Gwendalyn Ege, MD as Consulting Physician (Ophthalmology)    Assessment:   This is a routine wellness examination for Evelyn Jordan.  Exercise Activities and Dietary recommendations Current Exercise Habits: Home exercise routine, Type of exercise: walking, Time (Minutes): 30, Frequency (Times/Week): 5, Weekly Exercise (Minutes/Week): 150, Intensity: Mild  Goals    . Maintain current health        Fall Risk Fall Risk  03/07/2019 11/29/2018 12/02/2017 10/06/2016  Falls in the past year? 0 0 No No  Number falls in past yr: 0 0 - -  Injury with Fall? 0 0 - -  Follow up Education provided;Falls prevention discussed;Falls evaluation completed - - -   Is the patient's home free of loose throw rugs in walkways, pet beds, electrical cords, etc?   yes      Grab bars in the bathroom? yes      Handrails on the stairs?   yes      Adequate lighting?   yes  Timed Get Up and Go performed: completed and within normal timeframe; no gait abnormalities noted    Depression Screen PHQ 2/9 Scores 03/07/2019 11/29/2018 12/02/2017 10/06/2016  PHQ - 2 Score 0 1 0 0  PHQ- 9 Score - 2 1 -     Cognitive Function-no cognitive concerns at this time      6CIT Screen 03/07/2019  What Year? 0 points  What month? 0 points  What time? 0 points  Count back from 20 0 points  Months in reverse 0 points  Repeat phrase 0 points  Total Score 0    Immunization History  Administered Date(s) Administered  . Fluad Quad(high Dose 65+) 03/07/2019  . Hepatitis A 09/03/2006, 06/15/2007  . IPV 06/15/2007  . Influenza,inj,Quad PF,6+ Mos 07/07/2018  . Influenza-Unspecified 04/24/2017  . MMR 09/03/2006  . Meningococcal Conjugate 06/15/2007  . Pneumococcal Conjugate-13 12/02/2017  . Pneumococcal Polysaccharide-23 04/08/2013  . Tdap 09/03/2006, 12/23/2017  . Typhoid  Inactivated 09/03/2006  . Yellow Fever 09/03/2006  . Zoster 09/16/2013  . Zoster Recombinat (Shingrix) 05/17/2018, 10/19/2018    Qualifies for Shingles Vaccine? Shingrix completed   Screening Tests Health Maintenance  Topic Date Due  . Hepatitis C Screening  1950-11-15  . PNA vac Low Risk Adult (2 of 2 - PPSV23) 12/03/2018  . MAMMOGRAM  03/03/2021  . COLONOSCOPY  08/13/2024  . TETANUS/TDAP  12/24/2027  . INFLUENZA VACCINE  Completed  . DEXA SCAN  Completed    Cancer  Screenings: Lung: Low Dose CT Chest recommended if Age 33-80 years, 30 pack-year currently smoking OR have quit w/in 15years. Patient does not qualify. Breast:  Up to date on Mammogram? Yes   Up to date of Bone Density/Dexa? Yes Colorectal: colonoscopy 08/14/14    Plan:  I have personally reviewed and addressed the Medicare Annual Wellness questionnaire and have noted the following in the patient's chart:  A. Medical and social history B. Use of alcohol, tobacco or illicit drugs  C. Current medications and supplements D. Functional ability and status E.  Nutritional status F.  Physical activity G. Advance directives H. List of other physicians I.  Hospitalizations, surgeries, and ER visits in previous 12 months J.  [place] such as hearing and vision if needed, cognitive and depression L. Referrals, records requested, and appointments- none   In addition, I have reviewed and discussed with patient certain preventive protocols, quality metrics, and best practice recommendations. A written personalized care plan for preventive services as well as general preventive health recommendations were provided to patient.   Signed,  Denman George, LPN  Nurse Health Advisor   Nurse Notes: Patient is requesting refills of Ambien and Alprazolam.   She is also having problems with hemorrhoids.  Unable to get them to recede after bowel movements.  Has not tried anything over the counter.    I have reviewed  documentation for AWV and Advance Care planning provided by [facility], I agree with documentation, I was immediately available for any questions. Inda Coke, [place]  I reviewed the nursing notes, and patient does appear to have a scheduled follow-up with her PCP on October 6 to address her concerns.

## 2019-03-15 ENCOUNTER — Ambulatory Visit (INDEPENDENT_AMBULATORY_CARE_PROVIDER_SITE_OTHER): Payer: Medicare Other | Admitting: Family Medicine

## 2019-03-15 ENCOUNTER — Encounter: Payer: Self-pay | Admitting: Family Medicine

## 2019-03-15 ENCOUNTER — Other Ambulatory Visit: Payer: Self-pay

## 2019-03-15 VITALS — BP 138/80 | HR 60 | Temp 98.3°F | Ht 63.25 in | Wt 120.2 lb

## 2019-03-15 DIAGNOSIS — R059 Cough, unspecified: Secondary | ICD-10-CM

## 2019-03-15 DIAGNOSIS — Z13220 Encounter for screening for lipoid disorders: Secondary | ICD-10-CM | POA: Diagnosis not present

## 2019-03-15 DIAGNOSIS — R05 Cough: Secondary | ICD-10-CM | POA: Diagnosis not present

## 2019-03-15 MED ORDER — AMOXICILLIN 875 MG PO TABS: 875.0000 mg | ORAL_TABLET | Freq: Two times a day (BID) | ORAL | 0 refills | Status: DC

## 2019-03-15 NOTE — Patient Instructions (Signed)
1. Try hard candy. Start an antihistamine - Zyrtec. 2. If not improved, take the antibiotic.  Let us know if not better after 2 weeks. Will send to ENT.

## 2019-03-15 NOTE — Progress Notes (Signed)
Evelyn Jordan is a 68 y.o. female is here for follow up.  History of Present Illness:   Evelyn Jordan, RMA, acting as scribe for Dr. Briscoe Deutscher.   HPI: Patient here for a follow up visit from medicare wellness visit on 03/07/19.  Patient c/o swelling on Lt side of neck and scratchy throat x 1 month.  Patient also c/o Lt knee discomfort along with some swelling.  Patient had Lt knee surgery (Torn Meniscus) in 05/2018.    Health Maintenance Due  Topic Date Due  . Hepatitis C Screening  [birth year]   Depression screen [facility] 2/9 03/15/2019 03/07/2019 11/29/2018  Decreased Interest 0 0 0  Down, Depressed, Hopeless 0 0 1  PHQ - 2 Score 0 0 1  Altered sleeping 0 - 1  Tired, decreased energy 0 - 0  Change in appetite 0 - 0  Feeling bad or failure about yourself  0 - 0  Trouble concentrating 0 - 0  Moving slowly or fidgety/restless 0 - 0  Suicidal thoughts 0 - 0  PHQ-9 Score 0 - 2  Difficult doing work/chores Not difficult at all - Not difficult at all   PMHx, SurgHx, SocialHx, FamHx, Medications, and Allergies were reviewed in the Visit Navigator and updated as appropriate.   Patient Active Problem List   Diagnosis Date Noted  . 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 Osteopenia 12/23/2017  . Primary insomnia 12/04/2017   Social History   Tobacco Use  . Smoking status: Former Research scientist (life sciences)  . Smokeless tobacco: Never Used  . Tobacco comment: quit in 2011  Substance Use Topics  . Alcohol use: Yes    Comment: Rarely  . Drug use: No   Current Medications and Allergies   Current Outpatient Medications:  .  ALPRAZolam (XANAX) 0.5 MG tablet, TAKE 1 TABLET(0.5 MG) BY MOUTH DAILY AS NEEDED FOR ANXIETY, Disp: 30 tablet, Rfl: 1 .  zolpidem (AMBIEN) 5 MG tablet, TAKE 1 TABLET(5 MG) BY MOUTH AT BEDTIME AS NEEDED FOR SLEEP, Disp: 15 tablet, Rfl: 1 .  amoxicillin (AMOXIL) 875 MG tablet, Take 1 tablet (875 mg total) by mouth 2 (two) times daily., Disp: 20 tablet, Rfl: 0   Allergies    Allergen Reactions  . Compazine [Prochlorperazine Edisylate]     Neck hyperextends   Review of Systems   Pertinent items are noted in the HPI. Otherwise, a complete ROS is negative.  Vitals   Vitals:   03/15/19 1342  BP: 138/80  Pulse: 60  Temp: 98.3 F (36.8 C)  TempSrc: Temporal  SpO2: 96%  Weight: 120 lb 3.2 oz (54.5 kg)  Height: 5' 3.25" (1.607 m)     Body mass index is 21.12 kg/m.  Physical Exam   Physical Exam Vitals signs and nursing note reviewed.  HENT:     Head: Normocephalic and atraumatic.  Eyes:     Pupils: Pupils are equal, round, and reactive to light.  Neck:     Musculoskeletal: Normal range of motion and neck supple.  Cardiovascular:     Rate and Rhythm: Normal rate and regular rhythm.     Heart sounds: Normal heart sounds.  Pulmonary:     Effort: Pulmonary effort is normal.  Abdominal:     Palpations: Abdomen is soft.  Skin:    General: Skin is warm.  Psychiatric:        Behavior: Behavior normal.     Lab Results  Component Value Date   WBC 5.3 03/23/2019  HGB 13.0 03/23/2019   HCT 38.5 03/23/2019   MCV 93.2 03/23/2019   PLT 221.0 03/23/2019   Lab Results  Component Value Date   CREATININE 0.83 03/23/2019   Lab Results  Component Value Date   ALT 16 03/23/2019   AST 20 03/23/2019   ALKPHOS 54 03/23/2019   BILITOT 0.4 03/23/2019   Dg Chest 2 View  Result Date: 03/24/2019 CLINICAL DATA:  Cough. EXAM: CHEST - 2 VIEW COMPARISON:  No recent. FINDINGS: Mediastinum and hilar structures normal. Heart size normal. Small nodule noted projected over the right lung base and over the periphery of the left lung. These may be calcified however for further evaluation nonenhanced chest CT suggested. No focal infiltrate. No pleural effusion or pneumothorax. Thoracic spine scoliosis and degenerative change. Degenerative changes both shoulders. No acute bony abnormality. IMPRESSION: 1. Small nodule projected over the right lung base and over the  periphery of the left mid lung. These may be calcified, however further evaluation nonenhanced chest CT suggested. 2.  No acute infiltrate. Electronically Signed   By: Marcello Moores  Register   On: 03/24/2019 07:04   Mm 3d Screen Breast Bilateral  Result Date: 03/04/2019 CLINICAL DATA:  Screening. EXAM: DIGITAL SCREENING BILATERAL MAMMOGRAM WITH TOMO AND CAD COMPARISON:  Previous exam(s). ACR Breast Density Category b: There are scattered areas of fibroglandular density. FINDINGS: There are no findings suspicious for malignancy. Images were processed with CAD. IMPRESSION: No mammographic evidence of malignancy. A result letter of this screening mammogram will be mailed directly to the patient. RECOMMENDATION: Screening mammogram in one year. (Code:SM-B-01Y) BI-RADS CATEGORY  1: Negative. Electronically Signed   By: Abelardo Diesel M.D.   On: 03/04/2019 12:32   Assessment and Plan   Evelyn Jordan was seen today for follow-up.  Diagnoses and all orders for this visit:  Cough -     Cancel: CBC with Differential/Platelet -     Cancel: Comprehensive metabolic panel -     amoxicillin (AMOXIL) 875 MG tablet; Take 1 tablet (875 mg total) by mouth 2 (two) times daily. -     Cancel: DG Chest 2 View -     CBC with Differential/Platelet; Future -     Comprehensive metabolic panel; Future -     DG Chest 2 View; Future  Screening for lipid disorders -     Cancel: Lipid panel -     Cancel: Lipid panel -     Lipid panel; Future   . Orders and follow up as documented in [place], reviewed diet, exercise and weight control, cardiovascular risk and specific lipid/LDL goals reviewed, reviewed medications and side effects in detail.  . Reviewed expectations re: course of current medical issues. . Outlined signs and symptoms indicating need for more acute intervention. . Patient verbalized understanding and all questions were answered. . Patient received an After Visit Summary.  CMA served as Education administrator during this visit.  History, Physical, and Plan performed by medical provider. The above documentation has been reviewed and is accurate and complete. Briscoe Deutscher, D.O.  Briscoe Deutscher, DO [place] 03/27/2019

## 2019-03-23 ENCOUNTER — Other Ambulatory Visit: Payer: Self-pay

## 2019-03-23 ENCOUNTER — Other Ambulatory Visit (INDEPENDENT_AMBULATORY_CARE_PROVIDER_SITE_OTHER): Payer: Medicare Other

## 2019-03-23 ENCOUNTER — Ambulatory Visit (INDEPENDENT_AMBULATORY_CARE_PROVIDER_SITE_OTHER): Payer: Medicare Other

## 2019-03-23 DIAGNOSIS — Z13220 Encounter for screening for lipoid disorders: Secondary | ICD-10-CM

## 2019-03-23 DIAGNOSIS — R059 Cough, unspecified: Secondary | ICD-10-CM

## 2019-03-23 DIAGNOSIS — R918 Other nonspecific abnormal finding of lung field: Secondary | ICD-10-CM

## 2019-03-23 DIAGNOSIS — R05 Cough: Secondary | ICD-10-CM

## 2019-03-23 DIAGNOSIS — Z23 Encounter for immunization: Secondary | ICD-10-CM | POA: Diagnosis not present

## 2019-03-23 DIAGNOSIS — R911 Solitary pulmonary nodule: Secondary | ICD-10-CM

## 2019-03-23 LAB — COMPREHENSIVE METABOLIC PANEL
ALT: 16 U/L (ref 0–35)
AST: 20 U/L (ref 0–37)
Albumin: 3.9 g/dL (ref 3.5–5.2)
Alkaline Phosphatase: 54 U/L (ref 39–117)
BUN: 14 mg/dL (ref 6–23)
CO2: 28 mEq/L (ref 19–32)
Calcium: 9 mg/dL (ref 8.4–10.5)
Chloride: 106 mEq/L (ref 96–112)
Creatinine, Ser: 0.83 mg/dL (ref 0.40–1.20)
GFR: 68.44 mL/min (ref >60.00)
Glucose, Bld: 75 mg/dL (ref 70–99)
Potassium: 3.7 mEq/L (ref 3.5–5.1)
Sodium: 141 mEq/L (ref 135–145)
Total Bilirubin: 0.4 mg/dL (ref 0.2–1.2)
Total Protein: 6.1 g/dL (ref 6.0–8.3)

## 2019-03-23 LAB — LIPID PANEL
Cholesterol: 197 mg/dL (ref 0–200)
HDL: 69 mg/dL (ref >39.00)
LDL Cholesterol: 105 mg/dL — ABNORMAL HIGH (ref 0–99)
NonHDL: 128.42
Total CHOL/HDL Ratio: 3
Triglycerides: 116 mg/dL (ref 0.0–149.0)
VLDL: 23.2 mg/dL (ref 0.0–40.0)

## 2019-03-23 LAB — CBC WITH DIFFERENTIAL/PLATELET
Basophils Absolute: 0 10*3/uL (ref 0.0–0.1)
Basophils Relative: 0.8 % (ref 0.0–3.0)
Eosinophils Absolute: 0.1 10*3/uL (ref 0.0–0.7)
Eosinophils Relative: 1.8 % (ref 0.0–5.0)
HCT: 38.5 % (ref 36.0–46.0)
Hemoglobin: 13 g/dL (ref 12.0–15.0)
Lymphocytes Relative: 26.3 % (ref 12.0–46.0)
Lymphs Abs: 1.4 10*3/uL (ref 0.7–4.0)
MCHC: 33.7 g/dL (ref 30.0–36.0)
MCV: 93.2 fl (ref 78.0–100.0)
Monocytes Absolute: 0.5 10*3/uL (ref 0.1–1.0)
Monocytes Relative: 9.6 % (ref 3.0–12.0)
Neutro Abs: 3.3 10*3/uL (ref 1.4–7.7)
Neutrophils Relative %: 61.5 % (ref 43.0–77.0)
Platelets: 221 10*3/uL (ref 150.0–400.0)
RBC: 4.13 Mil/uL (ref 3.87–5.11)
RDW: 13.3 % (ref 11.5–15.5)
WBC: 5.3 10*3/uL (ref 4.0–10.5)

## 2019-03-24 ENCOUNTER — Encounter: Payer: Self-pay | Admitting: Family Medicine

## 2019-03-24 NOTE — Addendum Note (Signed)
Addended by: Briscoe Deutscher R on: 03/24/2019 05:11 PM   Modules accepted: Orders

## 2019-03-25 ENCOUNTER — Other Ambulatory Visit: Payer: Self-pay

## 2019-03-25 ENCOUNTER — Encounter: Payer: Self-pay | Admitting: Family Medicine

## 2019-03-25 DIAGNOSIS — R9389 Abnormal findings on diagnostic imaging of other specified body structures: Secondary | ICD-10-CM

## 2019-03-30 ENCOUNTER — Other Ambulatory Visit: Payer: Self-pay | Admitting: Family Medicine

## 2019-03-30 DIAGNOSIS — R9389 Abnormal findings on diagnostic imaging of other specified body structures: Secondary | ICD-10-CM

## 2019-03-31 ENCOUNTER — Encounter: Payer: Self-pay | Admitting: Family Medicine

## 2019-03-31 NOTE — Addendum Note (Signed)
Addended by: Briscoe Deutscher R on: 03/31/2019 03:40 PM   Modules accepted: Orders

## 2019-04-06 ENCOUNTER — Other Ambulatory Visit: Payer: Self-pay

## 2019-04-06 ENCOUNTER — Ambulatory Visit
Admission: RE | Admit: 2019-04-06 | Discharge: 2019-04-06 | Disposition: A | Discharge status: Home / Self Care | Payer: Medicare Other | Source: Ambulatory Visit | Attending: Family Medicine | Admitting: Family Medicine

## 2019-04-06 DIAGNOSIS — R9389 Abnormal findings on diagnostic imaging of other specified body structures: Secondary | ICD-10-CM

## 2019-04-06 DIAGNOSIS — J8410 Pulmonary fibrosis, unspecified: Secondary | ICD-10-CM | POA: Diagnosis not present

## 2019-04-11 ENCOUNTER — Encounter (INDEPENDENT_AMBULATORY_CARE_PROVIDER_SITE_OTHER): Payer: Self-pay | Admitting: Family Medicine

## 2019-04-11 NOTE — Telephone Encounter (Signed)
Please review

## 2019-04-14 ENCOUNTER — Other Ambulatory Visit: Payer: Self-pay

## 2019-04-14 ENCOUNTER — Encounter: Payer: Self-pay | Admitting: Family Medicine

## 2019-04-14 ENCOUNTER — Ambulatory Visit (INDEPENDENT_AMBULATORY_CARE_PROVIDER_SITE_OTHER): Payer: Medicare Other | Admitting: Family Medicine

## 2019-04-14 VITALS — BP 140/82 | HR 62 | Temp 98.0°F | Ht 63.25 in | Wt 121.6 lb

## 2019-04-14 DIAGNOSIS — R918 Other nonspecific abnormal finding of lung field: Secondary | ICD-10-CM

## 2019-04-14 DIAGNOSIS — E041 Nontoxic single thyroid nodule: Secondary | ICD-10-CM | POA: Diagnosis not present

## 2019-04-14 DIAGNOSIS — F418 Other specified anxiety disorders: Secondary | ICD-10-CM

## 2019-04-14 DIAGNOSIS — F5101 Primary insomnia: Secondary | ICD-10-CM | POA: Diagnosis not present

## 2019-04-14 MED ORDER — ZOLPIDEM TARTRATE 5 MG PO TABS: ORAL_TABLET | ORAL | 5 refills | Status: DC

## 2019-04-14 MED ORDER — ALPRAZOLAM 0.5 MG PO TABS: ORAL_TABLET | ORAL | 1 refills | Status: DC

## 2019-04-14 NOTE — Patient Instructions (Signed)
Please return in 11 months for your annual complete physical; please come fasting.  Please come in if you need more help with managing stress or anxiety. Be cautious with your xanax use as we discussed.   It was a pleasure meeting you!  We will call you with information regarding your referral appointment. Thyroid ultrasound.  If you do not hear from [place] within the next 2 weeks, please let me know.  If you have any questions or concerns, please don't hesitate to send me a message via MyChart or call the office at [phone]. Thank you for visiting with [place] today! It's our pleasure caring for you.

## 2019-04-14 NOTE — Progress Notes (Signed)
Subjective  CC:  Chief Complaint  Patient presents with  . Transitions Of Care    follow up on CT and order u/s     HPI: Evelyn Jordan is a 68 y.o. female who presents to [facility] [place] today to establish care with me as a new patient.   She has the following concerns or needs:  Pleasant 68 year old female mostly healthy here for transfer of care and follow-up by pulmonary and thyroid nodules.  Past medical history reviewed in detail.  Has recent onset of situational anxiety mostly related to Covid pandemic restrictions, lifestyle changes and some familial stressors: Her daughter has had any atrial problems.  Due to this, her former PCP starting low-dose Xanax to be used on an as-needed basis.  She has been using it about twice weekly.  Finding it very helpful.  She denies panic attacks, history of mood disorders or general anxiety.  She sleeps well.  Stressors persist as does her irritability and anxiety.  She would like a refill.  Chronic primary insomnia managed with low-dose intermittent use of Ambien.  Looks well.  Needs a refill.  Understands risk versus benefits.  Recently evaluated for dry cough, unclear etiology.  Denies allergy symptoms, PND, wheezing, shortness of breath or dyspnea on exertion.  She was a long-term former smoker, quit several years ago, 40-pack-year history.  Chest x-ray noted pulmonary nodule, follow-up with a chest CT showed 2 small pulmonary nodules and a thyroid nodule incidentally.  Here for result review.  She denies hemoptysis, chest pain or goiter  Assessment  1. Thyroid nodule   2. Situational anxiety   3. Primary insomnia   4. Pulmonary nodules      Plan   Thyroid nodule: Check thyroid ultrasound.  Clinical exam is unremarkable.  Pulmonary nodules: Qualifies for repeat chest CT in 6 to 12 months to ensure stability.  Likely granulomas.  Reassured.  Situational anxiety: Counseled at length.  He will monitor for  worsening or more persistent symptoms.  Defer daily antidepressant at this time.  Continue as needed Xanax use.  Caution advised.  Discussed risk of tolerance or dependency.  Primary insomnia on low-dose Ambien as needed.  Working well.  Refilled.  Health maintenance is up-to-date.  Complete physical is due in October  Follow up:  Return in about 11 months (around 03/13/2020) for complete physical. Orders Placed This Encounter  Procedures  . US Soft Tissue Head/Neck   Meds ordered this encounter  Medications  . ALPRAZolam (XANAX) 0.5 MG tablet    Sig: TAKE 1 TABLET(0.5 MG) BY MOUTH DAILY AS NEEDED FOR ANXIETY    Dispense:  30 tablet    Refill:  1  . zolpidem (AMBIEN) 5 MG tablet    Sig: TAKE 1 TABLET(5 MG) BY MOUTH AT BEDTIME AS NEEDED FOR SLEEP    Dispense:  30 tablet    Refill:  5     Depression screen [facility] 2/9 03/15/2019 03/07/2019 11/29/2018 12/02/2017 10/06/2016  Decreased Interest 0 0 0 0 0  Down, Depressed, Hopeless 0 0 1 0 0  PHQ - 2 Score 0 0 1 0 0  Altered sleeping 0 - 1 0 -  Tired, decreased energy 0 - 0 1 -  Change in appetite 0 - 0 0 -  Feeling bad or failure about yourself  0 - 0 0 -  Trouble concentrating 0 - 0 0 -  Moving slowly or fidgety/restless 0 - 0 0 -  Suicidal thoughts 0 - 0 0 -  PHQ-9 Score 0 - 2 1 -  Difficult doing work/chores Not difficult at all - Not difficult at all Not difficult at all -    We updated and reviewed the patient's past history in detail and it is documented below.  Patient Active Problem List   Diagnosis Date Noted  . Pulmonary nodules 04/14/2019    Incidental finding on cxr/CT 03/2019; small. Recommend repeat in 6-12 months to ensure stability. Likely benign.    . Thyroid nodule 04/14/2019    Incidental finding on chest ct 03/2019; ultrasound ordered   . Synovial cyst of popliteal space 04/05/2018    XR L knee 10/17/2016: IMPRESSION: 1. Small joint effusion without acute finding or degenerative narrowing. 2. Atherosclerosis.   Updated XR 12/9 - Normal    . Osteopenia 12/23/2017  . Primary insomnia 12/04/2017   Health Maintenance  Topic Date Due  . Hepatitis C Screening  February 14, 1951  . MAMMOGRAM  03/03/2020  . COLONOSCOPY  08/13/2024  . TETANUS/TDAP  12/24/2027  . INFLUENZA VACCINE  Completed  . DEXA SCAN  Completed  . PNA vac Low Risk Adult  Completed   Immunization History  Administered Date(s) Administered  . Fluad Quad(high Dose 65+) 03/07/2019  . Hepatitis A 09/03/2006, 06/15/2007  . IPV 06/15/2007  . Influenza,inj,Quad PF,6+ Mos 07/07/2018  . Influenza-Unspecified 04/24/2017  . MMR 09/03/2006  . Meningococcal Conjugate 06/15/2007  . Pneumococcal Conjugate-13 12/02/2017  . Pneumococcal Polysaccharide-23 04/08/2013, 03/23/2019  . Tdap 09/03/2006, 12/23/2017  . Typhoid Inactivated 09/03/2006  . Yellow Fever 09/03/2006  . Zoster 09/16/2013  . Zoster Recombinat (Shingrix) 05/17/2018, 10/19/2018   Current Meds  Medication Sig  . ALPRAZolam (XANAX) 0.5 MG tablet TAKE 1 TABLET(0.5 MG) BY MOUTH DAILY AS NEEDED FOR ANXIETY  . zolpidem (AMBIEN) 5 MG tablet TAKE 1 TABLET(5 MG) BY MOUTH AT BEDTIME AS NEEDED FOR SLEEP  . [DISCONTINUED] ALPRAZolam (XANAX) 0.5 MG tablet TAKE 1 TABLET(0.5 MG) BY MOUTH DAILY AS NEEDED FOR ANXIETY  . [DISCONTINUED] zolpidem (AMBIEN) 5 MG tablet TAKE 1 TABLET(5 MG) BY MOUTH AT BEDTIME AS NEEDED FOR SLEEP    Allergies: Patient is allergic to compazine [prochlorperazine edisylate]. Past Medical History Patient  has no past medical history on file. Past Surgical History Patient  has a past surgical history that includes Breast biopsy (Right) and repair of a torn meniscus (Left, 05/2018). Family History: Patient family history includes Alzheimer's disease in her mother; Hypertension in her father; Stroke in her father. Social History:  Patient  reports that she has quit smoking. She has never used smokeless tobacco. She reports current alcohol use. She reports that she does  not use drugs.  Review of Systems: Constitutional: negative for fever or malaise Ophthalmic: negative for photophobia, double vision or loss of vision Cardiovascular: negative for chest pain, dyspnea on exertion, or new LE swelling Respiratory: negative for SOB +persistent cough Gastrointestinal: negative for abdominal pain, change in bowel habits or melena Genitourinary: negative for dysuria or gross hematuria Musculoskeletal: negative for new gait disturbance or muscular weakness Integumentary: negative for new or persistent rashes Neurological: negative for TIA or stroke symptoms Psychiatric: negative for SI or delusions Allergic/Immunologic: negative for hives  Patient Care Team    Relationship Specialty Notifications Start End  Leamon Arnt, MD PCP - General Family Medicine  04/14/19   Marlou Sa, Tonna Corner, MD Consulting Physician Orthopedic Surgery  03/07/19   Gwendalyn Ege, MD Consulting Physician Ophthalmology  03/07/19  Objective  Vitals: BP 140/82   Pulse 62   Temp 98 F (36.7 C) (Temporal)   Ht 5' 3.25" (1.607 m)   Wt 121 lb 9.6 oz (55.2 kg)   SpO2 98%   BMI 21.37 kg/m  General:  Well developed, well nourished, no acute distress  Psych:  Alert and oriented,normal mood and affect HEENT:  Normocephalic, atraumatic, non-icteric sclera, PERRL, oropharynx is without mass or exudate, supple neck without adenopathy, mass or thyromegaly Cardiovascular:  RRR without gallop, rub or murmur, nondisplaced PMI Respiratory:  Good breath sounds bilaterally, CTAB with normal respiratory effort Skin:  Warm, no rashes or suspicious lesions noted Neurologic:    Mental status is normal Normal gait   Commons side effects, risks, benefits, and alternatives for medications and treatment plan prescribed today were discussed, and the patient expressed understanding of the given instructions. Patient is instructed to call or message via MyChart if he/she has any questions or concerns  regarding our treatment plan. No barriers to understanding were identified. We discussed Red Flag symptoms and signs in detail. Patient expressed understanding regarding what to do in case of urgent or emergency type symptoms.   Medication list was reconciled, printed and provided to the patient in AVS. Patient instructions and summary information was reviewed with the patient as documented in the AVS. This note was prepared with assistance of Dragon voice recognition software. Occasional wrong-word or sound-a-like substitutions may have occurred due to the inherent limitations of voice recognition software

## 2019-04-14 NOTE — Telephone Encounter (Signed)
Patient was seen today by DR. Jonni Sanger

## 2019-04-26 ENCOUNTER — Encounter: Payer: Self-pay | Admitting: Family Medicine

## 2019-04-27 ENCOUNTER — Other Ambulatory Visit: Payer: Self-pay | Admitting: Family Medicine

## 2019-04-27 DIAGNOSIS — F5101 Primary insomnia: Secondary | ICD-10-CM

## 2019-04-27 DIAGNOSIS — E041 Nontoxic single thyroid nodule: Secondary | ICD-10-CM

## 2019-05-09 ENCOUNTER — Ambulatory Visit
Admission: RE | Admit: 2019-05-09 | Discharge: 2019-05-09 | Disposition: A | Discharge status: Home / Self Care | Payer: Medicare Other | Source: Ambulatory Visit | Attending: Family Medicine | Admitting: Family Medicine

## 2019-05-09 DIAGNOSIS — E041 Nontoxic single thyroid nodule: Secondary | ICD-10-CM | POA: Diagnosis not present

## 2019-06-29 ENCOUNTER — Encounter: Payer: Self-pay | Admitting: Family Medicine

## 2019-07-02 ENCOUNTER — Other Ambulatory Visit: Payer: Self-pay | Admitting: *Deleted

## 2019-07-02 DIAGNOSIS — F418 Other specified anxiety disorders: Secondary | ICD-10-CM

## 2019-07-02 NOTE — Telephone Encounter (Signed)
Received refill request from pharmacy for Alprazolam 0.5 mg. Last OV 04/14/2019.

## 2019-07-04 MED ORDER — ALPRAZOLAM 0.5 MG PO TABS: ORAL_TABLET | ORAL | 0 refills | Status: DC

## 2019-07-04 NOTE — Telephone Encounter (Signed)
Please call patient: I have refilled xanax, however, use is becoming regular. If needing >twice weekly, recommend office visit for recheck and discuss alternative medications. Thanks.

## 2019-07-07 NOTE — Telephone Encounter (Signed)
Spoke to pt told her per Dr. Blima Ledger have refilled xanax, however, use is becoming regular. If needing >twice weekly, recommend office visit for recheck and discuss alternative medications. Pt verbalized understanding.

## 2019-08-13 ENCOUNTER — Encounter: Payer: Self-pay | Admitting: Family Medicine

## 2019-08-15 ENCOUNTER — Encounter: Payer: Self-pay | Admitting: Family Medicine

## 2019-08-15 NOTE — Telephone Encounter (Signed)
Called pt and she is going to call back to schedule appt.

## 2019-08-18 ENCOUNTER — Encounter: Payer: Self-pay | Admitting: Family Medicine

## 2019-08-20 ENCOUNTER — Ambulatory Visit: Payer: Medicare Other | Attending: Internal Medicine

## 2019-08-20 ENCOUNTER — Other Ambulatory Visit: Payer: Self-pay

## 2019-08-20 ENCOUNTER — Ambulatory Visit: Payer: Medicare Other

## 2019-08-20 DIAGNOSIS — Z23 Encounter for immunization: Secondary | ICD-10-CM

## 2019-08-20 NOTE — Progress Notes (Signed)
   Covid-19 Vaccination Clinic  Name:  Evelyn Jordan    MRN: [id number] DOB: Dec 10, 1950  08/20/2019  [name] was observed post Covid-19 immunization for 15 minutes without incident. She was provided with Vaccine Information Sheet and instruction to access the V-Safe system.   [name] was instructed to call 911 with any severe reactions post vaccine: Marland Kitchen Difficulty breathing  . Swelling of face and throat  . A fast heartbeat  . A bad rash all over body  . Dizziness and weakness   Immunizations Administered    Name Date Dose VIS Date Route   Pfizer COVID-19 Vaccine 08/20/2019 11:31 AM 0.3 mL 05/20/2019 Intramuscular   Manufacturer: [place]   Lot: [id number]   [place]: [id number]

## 2019-08-21 ENCOUNTER — Encounter: Payer: Self-pay | Admitting: Family Medicine

## 2019-08-21 DIAGNOSIS — F418 Other specified anxiety disorders: Secondary | ICD-10-CM

## 2019-08-22 ENCOUNTER — Other Ambulatory Visit: Payer: Self-pay

## 2019-08-22 DIAGNOSIS — F418 Other specified anxiety disorders: Secondary | ICD-10-CM

## 2019-08-22 MED ORDER — ALPRAZOLAM 0.5 MG PO TABS: ORAL_TABLET | ORAL | 0 refills | Status: DC

## 2019-08-22 NOTE — Telephone Encounter (Signed)
LAST APPOINTMENT DATE: 04/14/2019  NEXT APPOINTMENT DATE:@3 /15/2021   LAST REFILL: 07/04/2019  QTY: 30 TABLET

## 2019-08-24 ENCOUNTER — Telehealth: Payer: Self-pay

## 2019-08-24 DIAGNOSIS — F418 Other specified anxiety disorders: Secondary | ICD-10-CM

## 2019-08-24 NOTE — Telephone Encounter (Signed)
LAST APPOINTMENT DATE: 04/14/2019  NEXT APPOINTMENT DATE:@Visit  date not found   LAST REFILL: 04/27/2019  QTY: 30 tablet

## 2019-08-31 ENCOUNTER — Ambulatory Visit: Payer: Medicare Other | Admitting: Family Medicine

## 2019-09-13 ENCOUNTER — Ambulatory Visit: Payer: Medicare Other | Attending: Internal Medicine

## 2019-09-13 DIAGNOSIS — Z23 Encounter for immunization: Secondary | ICD-10-CM

## 2019-09-13 NOTE — Progress Notes (Signed)
   Covid-19 Vaccination Clinic  Name:  Evelyn Jordan    MRN: [id number] DOB: 07/07/1950  09/13/2019  [name] was observed post Covid-19 immunization for 15 minutes without incident. She was provided with Vaccine Information Sheet and instruction to access the V-Safe system.   [name] was instructed to call 911 with any severe reactions post vaccine: Marland Kitchen Difficulty breathing  . Swelling of face and throat  . A fast heartbeat  . A bad rash all over body  . Dizziness and weakness   Immunizations Administered    Name Date Dose VIS Date Route   Pfizer COVID-19 Vaccine 09/13/2019 12:04 PM 0.3 mL 05/20/2019 Intramuscular   Manufacturer: [place]   Lot: [id number]   [place]: [id number]

## 2019-10-18 DIAGNOSIS — H40013 Open angle with borderline findings, low risk, bilateral: Secondary | ICD-10-CM | POA: Diagnosis not present

## 2019-10-21 ENCOUNTER — Other Ambulatory Visit: Payer: Self-pay | Admitting: Family Medicine

## 2019-10-21 DIAGNOSIS — F5101 Primary insomnia: Secondary | ICD-10-CM

## 2019-10-21 NOTE — Telephone Encounter (Signed)
I reviewed patient's records from the PMP aware controlled substance registry today.

## 2020-01-18 ENCOUNTER — Other Ambulatory Visit: Payer: Self-pay | Admitting: Family Medicine

## 2020-01-18 DIAGNOSIS — F418 Other specified anxiety disorders: Secondary | ICD-10-CM

## 2020-01-19 NOTE — Telephone Encounter (Signed)
Called and LVM to schedule CPE.

## 2020-01-19 NOTE — Telephone Encounter (Signed)
Due for CPE in October. Hasn't yet scheduled. Please call to schedule a cpe (likely will be after October).  Thanks.

## 2020-01-21 ENCOUNTER — Encounter: Payer: Self-pay | Admitting: Family Medicine

## 2020-02-01 DIAGNOSIS — H1031 Unspecified acute conjunctivitis, right eye: Secondary | ICD-10-CM | POA: Diagnosis not present

## 2020-02-20 ENCOUNTER — Encounter: Payer: Self-pay | Admitting: Family Medicine

## 2020-03-19 ENCOUNTER — Encounter: Payer: Self-pay | Admitting: Family Medicine

## 2020-03-20 DIAGNOSIS — Z23 Encounter for immunization: Secondary | ICD-10-CM | POA: Diagnosis not present

## 2020-03-21 ENCOUNTER — Encounter: Payer: Self-pay | Admitting: Family Medicine

## 2020-03-21 ENCOUNTER — Other Ambulatory Visit: Payer: Self-pay

## 2020-03-21 ENCOUNTER — Ambulatory Visit (INDEPENDENT_AMBULATORY_CARE_PROVIDER_SITE_OTHER): Payer: Medicare Other | Admitting: Family Medicine

## 2020-03-21 VITALS — BP 112/64 | HR 70 | Temp 98.1°F | Wt 118.2 lb

## 2020-03-21 DIAGNOSIS — K648 Other hemorrhoids: Secondary | ICD-10-CM | POA: Diagnosis not present

## 2020-03-21 DIAGNOSIS — R918 Other nonspecific abnormal finding of lung field: Secondary | ICD-10-CM | POA: Diagnosis not present

## 2020-03-21 DIAGNOSIS — Z87891 Personal history of nicotine dependence: Secondary | ICD-10-CM | POA: Diagnosis not present

## 2020-03-21 MED ORDER — HYDROCORTISONE ACETATE 25 MG RE SUPP: 25.0000 mg | Freq: Two times a day (BID) | RECTAL | 0 refills | Status: DC

## 2020-03-21 NOTE — Progress Notes (Signed)
Subjective  CC:  Chief Complaint  Patient presents with  . Hemorrhoids    no relief with OTC meds, started bleeding several days ago    HPI: Evelyn Jordan is a 70 y.o. female who presents to the office today to address the problems listed above in the chief complaint.  Basically healthy 69 year old female who reports history of external hemorrhoids with intermittent flares of mild itching and occasional bleeding.  She reports that she was diagnosed back in 2016 when she had a colonoscopy.  Over the last 1 to 3 months she has had intermittent bouts and at times will have internal hemorrhoids protruding through the rectum and needing to manually replace.  Few days ago she had mild rectal bleeding.  This has since stopped.  She describes no pain symptoms now.  She eats healthy diet.  Takes fiber and Colace often.  Has soft easy to pass stools.  She denies abdominal pain, bloating, weight loss.  Pulmonary nodules, incidental finding on chest CT from October 2020.  Chronic history of smoking last use 8 years ago.  Recommended repeat chest CT in 6 to 12 months to ensure stability.  Patient is willing to do this.  She has no lung symptoms.   Assessment  1. Internal hemorrhoid   2. Pulmonary nodules   3. Personal history of nicotine dependence         Plan   Internal hemorrhoids: Nontender nonbleeding now.  Education given.  Preparation H as needed.  Proctocort or hydrocortisone suppositories if worsens.  See after visit summary  Pulmonary nodules in her former chronic smoker: Recommend repeat chest CT to ensure stable.  Ordered.  Follow up: Has complete physical scheduled 06/14/2020  Orders Placed This Encounter  Procedures  . CT CHEST NODULE FOLLOW UP LOW DOSE W/O   Meds ordered this encounter  Medications  . hydrocortisone (ANUSOL-HC) 25 MG suppository    Sig: Place 1 suppository (25 mg total) rectally 2 (two) times daily.    Dispense:  12 suppository    Refill:  0      I  reviewed the patients updated PMH, FH, and SocHx.    Patient Active Problem List   Diagnosis Date Noted  . Pulmonary nodules 04/14/2019  . Thyroid nodule 04/14/2019  . 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 Osteopenia 12/23/2017  . Primary insomnia 12/04/2017   Current Meds  Medication Sig  . ALPRAZolam (XANAX) 0.5 MG tablet TAKE 1 TABLET(0.5 MG) BY MOUTH DAILY AS NEEDED FOR ANXIETY  . zolpidem (AMBIEN) 5 MG tablet TAKE 1 TABLET(5 MG) BY MOUTH AT BEDTIME AS NEEDED FOR SLEEP    Allergies: Patient is allergic to compazine [prochlorperazine edisylate]. Family History: Patient family history includes Alzheimer's disease in her mother; Hypertension in her father; Stroke in her father. Social History:  Patient  reports that she has quit smoking. She has never used smokeless tobacco. She reports current alcohol use. She reports that she does not use drugs.  Review of Systems: Constitutional: Negative for fever malaise or anorexia Cardiovascular: negative for chest pain Respiratory: negative for SOB or persistent cough Gastrointestinal: negative for abdominal pain  Objective  Vitals: BP 112/64   Pulse 70   Temp 98.1 F (36.7 C) (Temporal)   Wt 118 lb 3.2 oz (53.6 kg)   SpO2 97%   BMI 20.77 kg/m  General: no acute distress , A&Ox3 Rectal exam: Normal external appearance without external hemorrhoids.  Nontender rectal exam without significant mass.  Soft  brown stool in the vault.  Small palpable nontender internal hemorrhoid     Commons side effects, risks, benefits, and alternatives for medications and treatment plan prescribed today were discussed, and the patient expressed understanding of the given instructions. Patient is instructed to call or message via MyChart if he/she has any questions or concerns regarding our treatment plan. No barriers to understanding were identified. We discussed Red Flag symptoms and signs in detail. Patient expressed understanding regarding  what to do in case of urgent or emergency type symptoms.   Medication list was reconciled, printed and provided to the patient in AVS. Patient instructions and summary information was reviewed with the patient as documented in the AVS. This note was prepared with assistance of Dragon voice recognition software. Occasional wrong-word or sound-a-like substitutions may have occurred due to the inherent limitations of voice recognition software  This visit occurred during the SARS-CoV-2 public health emergency.  Safety protocols were in place, including screening questions prior to the visit, additional usage of staff PPE, and extensive cleaning of exam room while observing appropriate contact time as indicated for disinfecting solutions.

## 2020-03-21 NOTE — Patient Instructions (Signed)
Please follow up as scheduled for your next visit with me: 06/14/2020   I will order a chest CT and we will call you to get it scheduled.  If you have any questions or concerns, please don't hesitate to send me a message via MyChart or call the office at [phone]. Thank you for visiting with [place] today! It's our pleasure caring for you.   Hemorrhoids Hemorrhoids are swollen veins in and around the rectum or anus. There are two types of hemorrhoids:  Internal hemorrhoids. These occur in the veins that are just inside the rectum. They may poke through to the outside and become irritated and painful.  External hemorrhoids. These occur in the veins that are outside the anus and can be felt as a painful swelling or hard lump near the anus. Most hemorrhoids do not cause serious problems, and they can be managed with home treatments such as diet and lifestyle changes. If home treatments do not help the symptoms, procedures can be done to shrink or remove the hemorrhoids. What are the causes? This condition is caused by increased pressure in the anal area. This pressure may result from various things, including:  Constipation.  Straining to have a bowel movement.  Diarrhea.  Pregnancy.  Obesity.  Sitting for long periods of time.  Heavy lifting or other activity that causes you to strain.  Anal sex.  Riding a bike for a long period of time. What are the signs or symptoms? Symptoms of this condition include:  Pain.  Anal itching or irritation.  Rectal bleeding.  Leakage of stool (feces).  Anal swelling.  One or more lumps around the anus. How is this diagnosed? This condition can often be diagnosed through a visual exam. Other exams or tests may also be done, such as:  An exam that involves feeling the rectal area with a gloved hand (digital rectal exam).  An exam of the anal canal that is done using a small tube (anoscope).  A blood test, if you have lost a significant  amount of blood.  A test to look inside the colon using a flexible tube with a camera on the end (sigmoidoscopy or colonoscopy). How is this treated? This condition can usually be treated at home. However, various procedures may be done if dietary changes, lifestyle changes, and other home treatments do not help your symptoms. These procedures can help make the hemorrhoids smaller or remove them completely. Some of these procedures involve surgery, and others do not. Common procedures include:  Rubber band ligation. Rubber bands are placed at the base of the hemorrhoids to cut off their blood supply.  Sclerotherapy. Medicine is injected into the hemorrhoids to shrink them.  Infrared coagulation. A type of light energy is used to get rid of the hemorrhoids.  Hemorrhoidectomy surgery. The hemorrhoids are surgically removed, and the veins that supply them are tied off.  Stapled hemorrhoidopexy surgery. The surgeon staples the base of the hemorrhoid to the rectal wall. Follow these instructions at home: Eating and drinking   Eat foods that have a lot of fiber in them, such as whole grains, beans, nuts, fruits, and vegetables.  Ask your health care provider about taking products that have added fiber (fiber supplements).  Reduce the amount of fat in your diet. You can do this by eating low-fat dairy products, eating less red meat, and avoiding processed foods.  Drink enough fluid to keep your urine pale yellow. Managing pain and swelling   Take warm sitz  baths for 20 minutes, 3-4 times a day to ease pain and discomfort. You may do this in a bathtub or using a portable sitz bath that fits over the toilet.  If directed, apply ice to the affected area. Using ice packs between sitz baths may be helpful. ? Put ice in a plastic bag. ? Place a towel between your skin and the bag. ? Leave the ice on for 20 minutes, 2-3 times a day. General instructions  Take over-the-counter and prescription  medicines only as told by your health care provider.  Use medicated creams or suppositories as told.  Get regular exercise. Ask your health care provider how much and what kind of exercise is best for you. In general, you should do moderate exercise for at least 30 minutes on most days of the week (150 minutes each week). This can include activities such as walking, biking, or yoga.  Go to the bathroom when you have the urge to have a bowel movement. Do not wait.  Avoid straining to have bowel movements.  Keep the anal area dry and clean. Use wet toilet paper or moist towelettes after a bowel movement.  Do not sit on the toilet for long periods of time. This increases blood pooling and pain.  Keep all follow-up visits as told by your health care provider. This is important. Contact a health care provider if you have:  Increasing pain and swelling that are not controlled by treatment or medicine.  Difficulty having a bowel movement, or you are unable to have a bowel movement.  Pain or inflammation outside the area of the hemorrhoids. Get help right away if you have:  Uncontrolled bleeding from your rectum. Summary  Hemorrhoids are swollen veins in and around the rectum or anus.  Most hemorrhoids can be managed with home treatments such as diet and lifestyle changes.  Taking warm sitz baths can help ease pain and discomfort.  In severe cases, procedures or surgery can be done to shrink or remove the hemorrhoids. This information is not intended to replace advice given to you by your health care provider. Make sure you discuss any questions you have with your health care provider. Document Revised: 10/22/2018 Document Reviewed: 10/15/2017 Elsevier Patient Education  [place].

## 2020-04-05 ENCOUNTER — Ambulatory Visit
Admission: RE | Admit: 2020-04-05 | Discharge: 2020-04-05 | Disposition: A | Discharge status: Home / Self Care | Payer: Medicare Other | Source: Ambulatory Visit | Attending: Family Medicine | Admitting: Family Medicine

## 2020-04-05 ENCOUNTER — Ambulatory Visit (INDEPENDENT_AMBULATORY_CARE_PROVIDER_SITE_OTHER): Payer: Medicare Other

## 2020-04-05 DIAGNOSIS — J8410 Pulmonary fibrosis, unspecified: Secondary | ICD-10-CM | POA: Diagnosis not present

## 2020-04-05 DIAGNOSIS — I2510 Atherosclerotic heart disease of native coronary artery without angina pectoris: Secondary | ICD-10-CM | POA: Diagnosis not present

## 2020-04-05 DIAGNOSIS — R918 Other nonspecific abnormal finding of lung field: Secondary | ICD-10-CM | POA: Diagnosis not present

## 2020-04-05 DIAGNOSIS — I700 Atherosclerosis of aorta: Secondary | ICD-10-CM | POA: Diagnosis not present

## 2020-04-05 DIAGNOSIS — Z0000 Encounter for general adult medical examination without abnormal findings: Secondary | ICD-10-CM

## 2020-04-05 DIAGNOSIS — Z1231 Encounter for screening mammogram for malignant neoplasm of breast: Secondary | ICD-10-CM

## 2020-04-05 DIAGNOSIS — Z87891 Personal history of nicotine dependence: Secondary | ICD-10-CM

## 2020-04-05 NOTE — Patient Instructions (Addendum)
Evelyn Jordan , Thank you for taking time to come for your Medicare Wellness Visit. I appreciate your ongoing commitment to your health goals. Please review the following plan we discussed and let me know if I can assist you in the future.   Screening recommendations/referrals: Colonoscopy: Done 08/14/14 Mammogram: Done 03/04/19 Bone Density: Done 12/17/17 Recommended yearly ophthalmology/optometry visit for glaucoma screening and checkup Recommended yearly dental visit for hygiene and checkup  Vaccinations: Influenza vaccine: Up to date Done 03/20/20 Pneumococcal vaccine: Up to date Tdap vaccine: Up to date Shingles vaccine: Completed 05/17/18 & 10/19/18   Covid-19:Completed 3/13, 4/6, & 03/22/20  Advanced directives: Copies in chart   Conditions/risks identified: Stay Healthy  Next appointment: Follow up in one year for your annual wellness visit    Preventive Care 68 Years and Older, Female Preventive care refers to lifestyle choices and visits with your health care provider that can promote health and wellness. What does preventive care include?  A yearly physical exam. This is also called an annual well check.  Dental exams once or twice a year.  Routine eye exams. Ask your health care provider how often you should have your eyes checked.  Personal lifestyle choices, including:  Daily care of your teeth and gums.  Regular physical activity.  Eating a healthy diet.  Avoiding tobacco and drug use.  Limiting alcohol use.  Practicing safe sex.  Taking low-dose aspirin every day.  Taking vitamin and mineral supplements as recommended by your health care provider. What happens during an annual well check? The services and screenings done by your health care provider during your annual well check will depend on your age, overall health, lifestyle risk factors, and family history of disease. Counseling  Your health care provider may ask you questions about your:  Alcohol  use.  Tobacco use.  Drug use.  Emotional well-being.  Home and relationship well-being.  Sexual activity.  Eating habits.  History of falls.  Memory and ability to understand (cognition).  Work and work Statistician.  Reproductive health. Screening  You may have the following tests or measurements:  Height, weight, and BMI.  Blood pressure.  Lipid and cholesterol levels. These may be checked every 5 years, or more frequently if you are over 104 years old.  Skin check.  Lung cancer screening. You may have this screening every year starting at age 5 if you have a 30-pack-year history of smoking and currently smoke or have quit within the past 15 years.  Fecal occult blood test (FOBT) of the stool. You may have this test every year starting at age 57.  Flexible sigmoidoscopy or colonoscopy. You may have a sigmoidoscopy every 5 years or a colonoscopy every 10 years starting at age 56.  Hepatitis C blood test.  Hepatitis B blood test.  Sexually transmitted disease (STD) testing.  Diabetes screening. This is done by checking your blood sugar (glucose) after you have not eaten for a while (fasting). You may have this done every 1-3 years.  Bone density scan. This is done to screen for osteoporosis. You may have this done starting at age 58.  Mammogram. This may be done every 1-2 years. Talk to your health care provider about how often you should have regular mammograms. Talk with your health care provider about your test results, treatment options, and if necessary, the need for more tests. Vaccines  Your health care provider may recommend certain vaccines, such as:  Influenza vaccine. This is recommended every year.  Tetanus,  diphtheria, and acellular pertussis (Tdap, Td) vaccine. You may need a Td booster every 10 years.  Zoster vaccine. You may need this after age 71.  Pneumococcal 13-valent conjugate (PCV13) vaccine. One dose is recommended after age  86.  Pneumococcal polysaccharide (PPSV23) vaccine. One dose is recommended after age 71. Talk to your health care provider about which screenings and vaccines you need and how often you need them. This information is not intended to replace advice given to you by your health care provider. Make sure you discuss any questions you have with your health care provider. Document Released: 06/22/2015 Document Revised: 02/13/2016 Document Reviewed: 03/27/2015 Elsevier Interactive Patient Education  2017 [place] Prevention in the Home Falls can cause injuries. They can happen to people of all ages. There are many things you can do to make your home safe and to help prevent falls. What can I do on the outside of my home?  Regularly fix the edges of walkways and driveways and fix any cracks.  Remove anything that might make you trip as you walk through a door, such as a raised step or threshold.  Trim any bushes or trees on the path to your home.  Use bright outdoor lighting.  Clear any walking paths of anything that might make someone trip, such as rocks or tools.  Regularly check to see if handrails are loose or broken. Make sure that both sides of any steps have handrails.  Any raised decks and porches should have guardrails on the edges.  Have any leaves, snow, or ice cleared regularly.  Use sand or salt on walking paths during winter.  Clean up any spills in your garage right away. This includes oil or grease spills. What can I do in the bathroom?  Use night lights.  Install grab bars by the toilet and in the tub and shower. Do not use towel bars as grab bars.  Use non-skid mats or decals in the tub or shower.  If you need to sit down in the shower, use a plastic, non-slip stool.  Keep the floor dry. Clean up any water that spills on the floor as soon as it happens.  Remove soap buildup in the tub or shower regularly.  Attach bath mats securely with double-sided  non-slip rug tape.  Do not have throw rugs and other things on the floor that can make you trip. What can I do in the bedroom?  Use night lights.  Make sure that you have a light by your bed that is easy to reach.  Do not use any sheets or blankets that are too big for your bed. They should not hang down onto the floor.  Have a firm chair that has side arms. You can use this for support while you get dressed.  Do not have throw rugs and other things on the floor that can make you trip. What can I do in the kitchen?  Clean up any spills right away.  Avoid walking on wet floors.  Keep items that you use a lot in easy-to-reach places.  If you need to reach something above you, use a strong step stool that has a grab bar.  Keep electrical cords out of the way.  Do not use floor polish or wax that makes floors slippery. If you must use wax, use non-skid floor wax.  Do not have throw rugs and other things on the floor that can make you trip. What can I do with  my stairs?  Do not leave any items on the stairs.  Make sure that there are handrails on both sides of the stairs and use them. Fix handrails that are broken or loose. Make sure that handrails are as long as the stairways.  Check any carpeting to make sure that it is firmly attached to the stairs. Fix any carpet that is loose or worn.  Avoid having throw rugs at the top or bottom of the stairs. If you do have throw rugs, attach them to the floor with carpet tape.  Make sure that you have a light switch at the top of the stairs and the bottom of the stairs. If you do not have them, ask someone to add them for you. What else can I do to help prevent falls?  Wear shoes that:  Do not have high heels.  Have rubber bottoms.  Are comfortable and fit you well.  Are closed at the toe. Do not wear sandals.  If you use a stepladder:  Make sure that it is fully opened. Do not climb a closed stepladder.  Make sure that both  sides of the stepladder are locked into place.  Ask someone to hold it for you, if possible.  Clearly mark and make sure that you can see:  Any grab bars or handrails.  First and last steps.  Where the edge of each step is.  Use tools that help you move around (mobility aids) if they are needed. These include:  Canes.  Walkers.  Scooters.  Crutches.  Turn on the lights when you go into a dark area. Replace any light bulbs as soon as they burn out.  Set up your furniture so you have a clear path. Avoid moving your furniture around.  If any of your floors are uneven, fix them.  If there are any pets around you, be aware of where they are.  Review your medicines with your doctor. Some medicines can make you feel dizzy. This can increase your chance of falling. Ask your doctor what other things that you can do to help prevent falls. This information is not intended to replace advice given to you by your health care provider. Make sure you discuss any questions you have with your health care provider. Document Released: 03/22/2009 Document Revised: 11/01/2015 Document Reviewed: 06/30/2014 Elsevier Interactive Patient Education  2017 [employer].

## 2020-04-05 NOTE — Progress Notes (Signed)
Virtual Visit via Telephone Note  I connected with  Evelyn Jordan on 04/05/20 at  8:45 AM EDT by telephone and verified that I am speaking with the correct person using two identifiers.  Medicare Annual Wellness visit completed telephonically due to Covid-19 pandemic.   Persons participating in this call: This Health Coach and this patient.   Location: Patient: Home Provider: Office   I discussed the limitations, risks, security and privacy concerns of performing an evaluation and management service by telephone and the availability of in person appointments. The patient expressed understanding and agreed to proceed.  Unable to perform video visit due to video visit attempted and failed and/or patient does not have video capability.   Some vital signs may be absent or patient reported.   Evelyn Brace, LPN    Subjective:   Evelyn Jordan is a 69 y.o. female who presents for Medicare Annual (Subsequent) preventive examination.  Review of Systems     Cardiac Risk Factors include: advanced age (>29mn, >>73women)     Objective:    There were no vitals filed for this visit. There is no height or weight on file to calculate BMI.  Advanced Directives 04/05/2020 03/07/2019 12/02/2017  Does Patient Have a Medical Advance Directive? Yes Yes No  Type of AParamedicof [place]hat[place]Living will Living will;Healthcare Power of Attorney -  Does patient want to make changes to medical advance directive? - No - Patient declined -  Copy of [place] Chart? Yes - validated most recent copy scanned in chart (See row information) Yes - validated most recent copy scanned in chart (See row information) -  Would patient like information on creating a medical advance directive? - - Yes (MAU/Ambulatory/Procedural Areas - Information given)    Current Medications (verified) Outpatient Encounter Medications as of 04/05/2020  Medication Sig  . ALPRAZolam  (XANAX) 0.5 MG tablet TAKE 1 TABLET(0.5 MG) BY MOUTH DAILY AS NEEDED FOR ANXIETY  . hydrocortisone (ANUSOL-HC) 25 MG suppository Place 1 suppository (25 mg total) rectally 2 (two) times daily.  .Marland Kitchenzolpidem (AMBIEN) 5 MG tablet TAKE 1 TABLET(5 MG) BY MOUTH AT BEDTIME AS NEEDED FOR SLEEP   No facility-administered encounter medications on file as of 04/05/2020.    Allergies (verified) Compazine [prochlorperazine edisylate]   History: History reviewed. No pertinent past medical history. Past Surgical History:  Procedure Laterality Date  . BREAST BIOPSY Right    benign  . repair of a torn meniscus Left 05/2018   Family History  Problem Relation Age of Onset  . Alzheimer's disease Mother   . Hypertension Father   . Stroke Father    Social History   Socioeconomic History  . Marital status: Married    Spouse name: Not on file  . Number of children: Not on file  . Years of education: Not on file  . Highest education level: Not on file  Occupational History    Comment: retired nMarine scientist  Tobacco Use  . Smoking status: Former SResearch scientist (life sciences) . Smokeless tobacco: Never Used  . Tobacco comment: quit in 2011  Vaping Use  . Vaping Use: Never used  Substance and Sexual Activity  . Alcohol use: Yes    Comment: Rarely  . Drug use: No  . Sexual activity: Not on file  Other Topics Concern  . Not on file  Social History Narrative  . Not on file   Social Determinants of Health   Financial Resource Strain:  Low Risk   . Difficulty of Paying Living Expenses: Not hard at all  Food Insecurity: No Food Insecurity  . Worried About Charity fundraiser in the Last Year: Never true  . Ran Out of Food in the Last Year: Never true  Transportation Needs: No Transportation Needs  . Lack of Transportation (Medical): No  . Lack of Transportation (Non-Medical): No  Physical Activity: Insufficiently Active  . Days of Exercise per Week: 5 days  . Minutes of Exercise per Session: 20 min  Stress: No Stress  Concern Present  . Feeling of Stress : Not at all  Social Connections: Moderately Isolated  . Frequency of Communication with Friends and Family: More than three times a week  . Frequency of Social Gatherings with Friends and Family: Twice a week  . Attends Religious Services: Never  . Active Member of Clubs or Organizations: No  . Attends Archivist Meetings: Never  . Marital Status: Married    Tobacco Counseling Counseling given: Not Answered Comment: quit in 2011   Clinical Intake:  Pre-visit preparation completed: Yes  Pain : No/denies pain     BMI - recorded: 20.77 Nutritional Status: BMI of 19-24  Normal Nutritional Risks: None Diabetes: No  How often do you need to have someone help you when you read instructions, pamphlets, or other written materials from your doctor or pharmacy?: 1 - Never  Diabetic?No  Interpreter Needed?: No  Information entered by :: Evelyn Rakes, LPN   Activities of Daily Living In your present state of health, do you have any difficulty performing the following activities: 04/05/2020  Hearing? N  Vision? N  Difficulty concentrating or making decisions? N  Walking or climbing stairs? N  Dressing or bathing? N  Doing errands, shopping? N  Preparing Food and eating ? N  Using the Toilet? N  In the past six months, have you accidently leaked urine? N  Do you have problems with loss of bowel control? N  Managing your Medications? N  Managing your Finances? N  Housekeeping or managing your Housekeeping? N  Some recent data might be hidden    Patient Care Team: Leamon Arnt, MD as PCP - General (Family Medicine) Marlou Sa Tonna Corner, MD as Consulting Physician (Orthopedic Surgery) Gwendalyn Ege, MD as Consulting Physician (Ophthalmology)  Indicate any recent Medical Services you may have received from other than [facility] providers in the past year (date may be approximate).     Assessment:   This is a routine  wellness examination for Evelyn Jordan.  Hearing/Vision screen  Hearing Screening   _0  _1  _2  _3  _4  _5  _6  _7  _8   Right ear:           Left ear:           Comments: Pt denies any hearing issues at this time  Vision Screening Comments: Follows up with Dr [place] for annual eye exams  Dietary issues and exercise activities discussed: Current Exercise Habits: Home exercise routine, Type of exercise: walking, Time (Minutes): 25, Frequency (Times/Week): 5, Weekly Exercise (Minutes/Week): 125  Goals    . Maintain current health     . Patient Stated     Stay healthy      Depression Screen PHQ 2/9 Scores 04/05/2020 03/15/2019 03/07/2019 11/29/2018 12/02/2017 10/06/2016  PHQ - 2 Score 0 0 0 1 0 0  PHQ- 9 Score - 0 - 2 1 -    Fall Risk Fall Risk  04/05/2020 04/14/2019 03/07/2019 11/29/2018 12/02/2017  Falls in the past year? 0 0 0 0 No  Number falls in past yr: 0 0 0 0 -  Injury with Fall? 0 0 0 0 -  Follow up Falls prevention discussed - Education provided;Falls prevention discussed;Falls evaluation completed - -    Any stairs in or around the home? Yes  If so, are there any without handrails? No  Home free of loose throw rugs in walkways, pet beds, electrical cords, etc? Yes  Adequate lighting in your home to reduce risk of falls? Yes   ASSISTIVE DEVICES UTILIZED TO PREVENT FALLS:  Life alert? No  Use of a cane, walker or w/c? No  Grab bars in the bathroom? Yes  Shower chair or bench in shower? No  Elevated toilet seat or a handicapped toilet? No   TIMED UP AND GO:  Was the test performed? No .     Cognitive Function:     6CIT Screen 04/05/2020 03/07/2019  What Year? 0 points 0 points  What month? 0 points 0 points  What time? - 0 points  Count back from 20 0 points 0 points  Months in reverse 0 points 0 points  Repeat phrase 0 points 0 points  Total Score - 0    Immunizations Immunization History  Administered Date(s) Administered  . Fluad  Quad(high Dose 65+) 03/07/2019, 03/20/2020  . Hepatitis A 09/03/2006, 06/15/2007  . IPV 06/15/2007  . Influenza,inj,Quad PF,6+ Mos 07/07/2018  . Influenza-Unspecified 04/24/2017  . MMR 09/03/2006  . Meningococcal Conjugate 06/15/2007  . PFIZER SARS-COV-2 Vaccination 08/20/2019, 09/13/2019, 03/20/2020  . Pneumococcal Conjugate-13 12/02/2017  . Pneumococcal Polysaccharide-23 04/08/2013, 03/23/2019  . Tdap 09/03/2006, 12/23/2017  . Typhoid Inactivated 09/03/2006  . Yellow Fever 09/03/2006  . Zoster 09/16/2013  . Zoster Recombinat (Shingrix) 05/17/2018, 10/19/2018    TDAP status: Up to date   Flu Vaccine status: Up to date Done 03/20/20  Pneumococcal vaccine status: Up to date   Covid-19 vaccine status: Completed vaccines  Qualifies for Shingles Vaccine? Yes   Zostavax completed Yes   Shingrix Completed?: Yes  Screening Tests Health Maintenance  Topic Date Due  . MAMMOGRAM  03/03/2020  . Hepatitis C Screening  04/05/2021 (Originally 1951-04-28)  . COLONOSCOPY  08/13/2024  . TETANUS/TDAP  12/24/2027  . INFLUENZA VACCINE  Completed  . DEXA SCAN  Completed  . COVID-19 Vaccine  Completed  . PNA vac Low Risk Adult  Completed    Health Maintenance  Health Maintenance Due  Topic Date Due  . MAMMOGRAM  03/03/2020    Colorectal cancer screening: Completed 08/14/14. Repeat every 10 years   Mammogram status: Completed 03/04/19. Repeat every year Ordered 04/05/20  Bone Density status: Completed 12/17/17. Results reflect: Bone density results: OSTEOPOROSIS. Repeat every 2 years.   Additional Screening:  Hepatitis C Screening: does qualify;   Vision Screening: Recommended annual ophthalmology exams for early detection of glaucoma and other disorders of the eye. Is the patient up to date with their annual eye exam?  Yes  Who is the provider or what is the name of the office in which the patient attends annual eye exams? Dr [place]    Dental Screening: Recommended annual dental  exams for proper oral hygiene  Community Resource Referral / Chronic Care Management: CRR required this visit?  No   CCM required this visit?  No      Plan:     I have personally reviewed and noted the following in the patient's chart:   . Medical and social  history . Use of alcohol, tobacco or illicit drugs  . Current medications and supplements . Functional ability and status . Nutritional status . Physical activity . Advanced directives . List of other physicians . Hospitalizations, surgeries, and ER visits in previous 12 months . Vitals . Screenings to include cognitive, depression, and falls . Referrals and appointments  In addition, I have reviewed and discussed with patient certain preventive protocols, quality metrics, and best practice recommendations. A written personalized care plan for preventive services as well as general preventive health recommendations were provided to patient.     Evelyn Brace, LPN   [id number]/02/[id number]   Nurse Notes: None

## 2020-04-08 ENCOUNTER — Encounter: Payer: Self-pay | Admitting: Family Medicine

## 2020-05-10 ENCOUNTER — Other Ambulatory Visit: Payer: Self-pay | Admitting: Family Medicine

## 2020-05-10 DIAGNOSIS — Z1231 Encounter for screening mammogram for malignant neoplasm of breast: Secondary | ICD-10-CM

## 2020-05-18 ENCOUNTER — Other Ambulatory Visit: Payer: Self-pay

## 2020-05-18 ENCOUNTER — Ambulatory Visit
Admission: RE | Admit: 2020-05-18 | Discharge: 2020-05-18 | Disposition: A | Discharge status: Home / Self Care | Payer: Medicare Other | Source: Ambulatory Visit | Attending: Family Medicine | Admitting: Family Medicine

## 2020-05-18 DIAGNOSIS — Z1231 Encounter for screening mammogram for malignant neoplasm of breast: Secondary | ICD-10-CM

## 2020-05-21 ENCOUNTER — Other Ambulatory Visit: Payer: Self-pay | Admitting: Family Medicine

## 2020-05-21 DIAGNOSIS — F5101 Primary insomnia: Secondary | ICD-10-CM

## 2020-05-21 NOTE — Telephone Encounter (Signed)
Last refill: 10/21/19 #30, 5 Last OV: 03/21/20 dx. Hemorrhoid

## 2020-06-14 ENCOUNTER — Ambulatory Visit (INDEPENDENT_AMBULATORY_CARE_PROVIDER_SITE_OTHER): Payer: Medicare Other | Admitting: Family Medicine

## 2020-06-14 ENCOUNTER — Encounter: Payer: Self-pay | Admitting: Family Medicine

## 2020-06-14 ENCOUNTER — Other Ambulatory Visit: Payer: Self-pay | Admitting: Family Medicine

## 2020-06-14 ENCOUNTER — Other Ambulatory Visit: Payer: Self-pay

## 2020-06-14 VITALS — BP 134/82 | HR 69 | Temp 98.6°F | Wt 122.6 lb

## 2020-06-14 DIAGNOSIS — Z1159 Encounter for screening for other viral diseases: Secondary | ICD-10-CM | POA: Diagnosis not present

## 2020-06-14 DIAGNOSIS — Z780 Asymptomatic menopausal state: Secondary | ICD-10-CM | POA: Diagnosis not present

## 2020-06-14 DIAGNOSIS — F418 Other specified anxiety disorders: Secondary | ICD-10-CM

## 2020-06-14 DIAGNOSIS — E041 Nontoxic single thyroid nodule: Secondary | ICD-10-CM | POA: Diagnosis not present

## 2020-06-14 DIAGNOSIS — F5101 Primary insomnia: Secondary | ICD-10-CM | POA: Diagnosis not present

## 2020-06-14 DIAGNOSIS — I2510 Atherosclerotic heart disease of native coronary artery without angina pectoris: Secondary | ICD-10-CM | POA: Diagnosis not present

## 2020-06-14 DIAGNOSIS — M8580 Other specified disorders of bone density and structure, unspecified site: Secondary | ICD-10-CM

## 2020-06-14 DIAGNOSIS — R918 Other nonspecific abnormal finding of lung field: Secondary | ICD-10-CM | POA: Diagnosis not present

## 2020-06-14 DIAGNOSIS — E2839 Other primary ovarian failure: Secondary | ICD-10-CM

## 2020-06-14 LAB — LIPID PANEL
Cholesterol: 211 mg/dL — ABNORMAL HIGH (ref 0–200)
HDL: 79.1 mg/dL (ref >39.00)
LDL Cholesterol: 116 mg/dL — ABNORMAL HIGH (ref 0–99)
NonHDL: 131.42
Total CHOL/HDL Ratio: 3
Triglycerides: 77 mg/dL (ref 0.0–149.0)
VLDL: 15.4 mg/dL (ref 0.0–40.0)

## 2020-06-14 LAB — COMPREHENSIVE METABOLIC PANEL
ALT: 15 U/L (ref 0–35)
AST: 18 U/L (ref 0–37)
Albumin: 4.2 g/dL (ref 3.5–5.2)
Alkaline Phosphatase: 52 U/L (ref 39–117)
BUN: 11 mg/dL (ref 6–23)
CO2: 30 mEq/L (ref 19–32)
Calcium: 9.4 mg/dL (ref 8.4–10.5)
Chloride: 105 mEq/L (ref 96–112)
Creatinine, Ser: 0.88 mg/dL (ref 0.40–1.20)
GFR: 67.32 mL/min (ref >60.00)
Glucose, Bld: 92 mg/dL (ref 70–99)
Potassium: 3.3 mEq/L — ABNORMAL LOW (ref 3.5–5.1)
Sodium: 141 mEq/L (ref 135–145)
Total Bilirubin: 0.6 mg/dL (ref 0.2–1.2)
Total Protein: 6.6 g/dL (ref 6.0–8.3)

## 2020-06-14 LAB — TSH: TSH: 2.12 u[IU]/mL (ref 0.35–4.50)

## 2020-06-14 LAB — CBC WITH DIFFERENTIAL/PLATELET
Basophils Absolute: 0 10*3/uL (ref 0.0–0.1)
Basophils Relative: 0.9 % (ref 0.0–3.0)
Eosinophils Absolute: 0 10*3/uL (ref 0.0–0.7)
Eosinophils Relative: 1.2 % (ref 0.0–5.0)
HCT: 41.1 % (ref 36.0–46.0)
Hemoglobin: 13.9 g/dL (ref 12.0–15.0)
Lymphocytes Relative: 19.6 % (ref 12.0–46.0)
Lymphs Abs: 0.8 10*3/uL (ref 0.7–4.0)
MCHC: 33.8 g/dL (ref 30.0–36.0)
MCV: 93.6 fl (ref 78.0–100.0)
Monocytes Absolute: 0.3 10*3/uL (ref 0.1–1.0)
Monocytes Relative: 8.7 % (ref 3.0–12.0)
Neutro Abs: 2.7 10*3/uL (ref 1.4–7.7)
Neutrophils Relative %: 69.6 % (ref 43.0–77.0)
Platelets: 230 10*3/uL (ref 150.0–400.0)
RBC: 4.39 Mil/uL (ref 3.87–5.11)
RDW: 14.2 % (ref 11.5–15.5)
WBC: 3.9 10*3/uL — ABNORMAL LOW (ref 4.0–10.5)

## 2020-06-14 NOTE — Progress Notes (Signed)
Subjective  Chief Complaint  Patient presents with  . Annual Exam    Non-fasting    HPI: Evelyn Jordan is a 70 y.o. female who presents to [facility] [place] today for a Female Wellness Visit. She also has the concerns and/or needs as listed above in the chief complaint. These will be addressed in addition to the Health Maintenance Visit.   Wellness Visit: annual visit with health maintenance review and exam without Pap   Maintenance: Mammogram normal and up-to-date.  Colorectal cancer screenings up-to-date.  All immunizations are up-to-date.  Candidate for hepatitis C screening today.  Feels well.  Healthy active lifestyle.  Lives at [place] now.  Enjoys kayaking.  Eats a healthy diet  Chronic disease f/u and/or acute problem visit: (deemed necessary to be done in addition to the wellness visit):  Primary chronic insomnia: Long-term problem.  Trouble sleeping due to overthinking at night.  He has tried multiple sleep medicines including melatonin and trazodone.  Now well controlled on nightly Ambien.  She denies adverse effects.  She understands risk versus benefits. I reviewed patient's records from the PMP aware controlled substance registry today.   Situational anxiety: Much improved.  This revolves around an unfortunate break-up of her daughter's marriage.  At this point she is using Xanax rarely.  Last refill was from August 2021.  No chronic mood issues.  Feeling much better.  Thyroid nodule and pulmonary nodules: He had surveillance ultrasounds and CT scans.  Reviewed again today.  All benign findings.  No further imaging needed.  No symptoms of hyper or hypothyroidism.  Osteopenia: Last bone density reviewed with patient.  T score -2.4.  She takes calcium and vitamin D and is active.  Due for repeat study.  Discussed treatment of osteoporosis if indeed she qualifies.  Will order Fosamax.  Chest CT showed atherosclerosis of coronary arteries: No chest  pain.  Low risk for atherosclerotic or coronary artery disease that is significant.  We will check nonfasting lipids today.  Blood pressures controlled.  Continue active lifestyle.   Assessment  1. Primary insomnia   2. Osteoporosis, post-menopausal   3. Situational anxiety   4. Thyroid nodule   5. Asymptomatic menopausal state   6. Atherosclerosis of native coronary artery of native heart without angina pectoris   7. Osteopenia after menopause   8. Need for hepatitis C screening test   9. Pulmonary nodules      Plan  Female Wellness Visit:  Age appropriate Health Maintenance and Prevention measures were discussed with patient. Included topics are cancer screening recommendations, ways to keep healthy (see AVS) including dietary and exercise recommendations, regular eye and dental care, use of seat belts, and avoidance of moderate alcohol use and tobacco use.  Screenings are up-to-date  BMI: discussed patient's BMI and encouraged positive lifestyle modifications to help get to or maintain a target BMI.  HM needs and immunizations were addressed and ordered. See below for orders. See HM and immunization section for updates.  Immunizations are up-to-date  Routine labs and screening tests ordered including cmp, cbc and lipids where appropriate.  Discussed recommendations regarding Vit D and calcium supplementation (see AVS)  Chronic disease management visit and/or acute problem visit:  Near osteoporosis: Check bone density and start Fosamax if qualifies.  Continue vitamin D calcium and active lifestyle.  Education given.  Anxiety resolved.  Chronic insomnia: Ambien 5 mg nightly.  Reviewed risks and benefits.  Cardiovascular risk evaluation: Check lipids and  sugars.  No further work-up for pulmonary nodules needed  Monitor annual TSH given history of thyroid nodules that are benign.   Follow up: 12 months for complete physical Orders Placed This Encounter  Procedures  . DG  Bone Density  . Hepatitis C antibody  . CBC with Differential/Platelet  . Comprehensive metabolic panel  . Lipid panel  . TSH   No orders of the defined types were placed in this encounter.     Lifestyle: Body mass index is 21.55 kg/m. Wt Readings from Last 3 Encounters:  06/14/20 122 lb 9.6 oz (55.6 kg)  03/21/20 118 lb 3.2 oz (53.6 kg)  04/14/19 121 lb 9.6 oz (55.2 kg)    Patient Active Problem List   Diagnosis Date Noted  . Pulmonary nodules 04/14/2019    Incidental finding on cxr/CT 03/2019; small.Likely benign. Repeated 03/2020: stable and benign. No further f/u recommended.   . Thyroid nodule 04/14/2019    Incidental finding on chest ct 03/2019; ultrasound did not confirm. Small nodules present: no f/u recommended. 04/2019   . 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 Osteopenia 12/23/2017  . Primary insomnia 12/04/2017   Health Maintenance  Topic Date Due  . DEXA SCAN  12/18/2019  . Hepatitis C Screening  04/05/2021 (Originally 1951-03-17)  . MAMMOGRAM  05/18/2021  . COLONOSCOPY (Pts 45-9yr Insurance coverage will need to be confirmed)  08/13/2024  . TETANUS/TDAP  12/24/2027  . INFLUENZA VACCINE  Completed  . COVID-19 Vaccine  Completed  . PNA vac Low Risk Adult  Completed   Immunization History  Administered Date(s) Administered  . Fluad Quad(high Dose 65+) 03/07/2019, 03/20/2020  . Hepatitis A 09/03/2006, 06/15/2007  . IPV 06/15/2007  . Influenza,inj,Quad PF,6+ Mos 07/07/2018  . Influenza-Unspecified 04/24/2017  . MMR 09/03/2006  . Meningococcal Conjugate 06/15/2007  . PFIZER SARS-COV-2 Vaccination 08/20/2019, 09/13/2019, 03/20/2020  . Pneumococcal Conjugate-13 12/02/2017  . Pneumococcal Polysaccharide-23 04/08/2013, 03/23/2019  . Tdap 09/03/2006, 12/23/2017  . Typhoid Inactivated 09/03/2006  . Yellow Fever 09/03/2006  . Zoster 09/16/2013  . Zoster Recombinat (Shingrix) 05/17/2018, 10/19/2018   We updated and reviewed the patient's past  history in detail and it is documented below. Allergies: Patient is allergic to compazine [prochlorperazine edisylate]. Past Medical History Patient  has no past medical history on file. Past Surgical History Patient  has a past surgical history that includes Breast biopsy (Right) and repair of a torn meniscus (Left, 05/2018). Family History: Patient family history includes Alzheimer's disease in her mother; Hypertension in her father; Stroke in her father. Social History:  Patient  reports that she has quit smoking. She has never used smokeless tobacco. She reports current alcohol use. She reports that she does not use drugs.  Review of Systems: Constitutional: negative for fever or malaise Ophthalmic: negative for photophobia, double vision or loss of vision Cardiovascular: negative for chest pain, dyspnea on exertion, or new LE swelling Respiratory: negative for SOB or persistent cough Gastrointestinal: negative for abdominal pain, change in bowel habits or melena Genitourinary: negative for dysuria or gross hematuria, no abnormal uterine bleeding or disharge Musculoskeletal: negative for new gait disturbance or muscular weakness Integumentary: negative for new or persistent rashes, no breast lumps Neurological: negative for TIA or stroke symptoms Psychiatric: negative for SI or delusions Allergic/Immunologic: negative for hives  Patient Care Team    Relationship Specialty Notifications Start End  ALeamon Arnt MD PCP - General Family Medicine  04/14/19   DMarlou Sa GTonna Corner MD Consulting Physician Orthopedic Surgery  03/07/19   Gwendalyn Ege, MD Consulting Physician Ophthalmology  03/07/19     Objective  Vitals: BP 134/82   Pulse 69   Temp 98.6 F (37 C) (Temporal)   Wt 122 lb 9.6 oz (55.6 kg)   SpO2 98%   BMI 21.55 kg/m  General:  Well developed, well nourished, no acute distress  Psych:  Alert and orientedx3,normal mood and affect HEENT:  Normocephalic,  atraumatic, non-icteric sclera,  supple neck without adenopathy, mass or thyromegaly Cardiovascular:  Normal S1, S2, RRR without gallop, rub or murmur Respiratory:  Good breath sounds bilaterally, CTAB with normal respiratory effort Gastrointestinal: normal bowel sounds, soft, non-tender, no noted masses. No HSM MSK: no deformities, contusions. Joints are without erythema or swelling.  Skin:  Warm, no rashes or suspicious lesions noted Neurologic:    Mental status is normal. CN 2-11 are normal. Gross motor and sensory exams are normal. Normal gait. No tremor Breast Exam: No mass, skin retraction or nipple discharge is appreciated in either breast. No axillary adenopathy. Fibrocystic changes are not noted    Commons side effects, risks, benefits, and alternatives for medications and treatment plan prescribed today were discussed, and the patient expressed understanding of the given instructions. Patient is instructed to call or message via MyChart if he/she has any questions or concerns regarding our treatment plan. No barriers to understanding were identified. We discussed Red Flag symptoms and signs in detail. Patient expressed understanding regarding what to do in case of urgent or emergency type symptoms.   Medication list was reconciled, printed and provided to the patient in AVS. Patient instructions and summary information was reviewed with the patient as documented in the AVS. This note was prepared with assistance of Dragon voice recognition software. Occasional wrong-word or sound-a-like substitutions may have occurred due to the inherent limitations of voice recognition software  This visit occurred during the SARS-CoV-2 public health emergency.  Safety protocols were in place, including screening questions prior to the visit, additional usage of staff PPE, and extensive cleaning of exam room while observing appropriate contact time as indicated for disinfecting solutions.

## 2020-06-14 NOTE — Patient Instructions (Signed)
Please return in 12 months for your annual complete physical; please come fasting.  I will release your lab results to you on your MyChart account with further instructions. Please reply with any questions.   If you have any questions or concerns, please don't hesitate to send me a message via MyChart or call the office at [phone]. Thank you for visiting with Evelyn Jordan today! It's our pleasure caring for you.  I have ordered a mammogram and/or bone density for you as we discussed today: '[]'   Mammogram  '[x]'   Bone Density  Please call the office checked below to schedule your appointment: Your appointment will at the following location  '[x]'   [facility]      [place], [place]         '[]'   [place], [place]     Osteoporosis  Osteoporosis is thinning and loss of density in your bones. Osteoporosis makes bones more brittle and fragile and more likely to break (fracture). Over time, osteoporosis can cause your bones to become so weak that they fracture after a minor fall. Bones in the hip, wrist, and spine are most likely to fracture due to osteoporosis. What are the causes? The exact cause of this condition is not known. What increases the risk? You may be at greater risk for osteoporosis if you:  Have a family history of the condition.  Have poor nutrition.  Use steroid medicines, such as prednisone.  Are female.  Are age 69 or older.  Smoke or have a history of smoking.  Are not physically active (are sedentary).  Are white (Caucasian) or of Asian descent.  Have a small body frame.  Take certain medicines, such as antiseizure medicines. What are the signs or symptoms? A fracture might be the first sign of osteoporosis, especially if the fracture results from a fall or injury that usually would not cause a bone to break. Other signs and symptoms include:  Pain in  the neck or low back.  Stooped posture.  Loss of height. How is this diagnosed? This condition may be diagnosed based on:  Your medical history.  A physical exam.  A bone mineral density test, also called a DXA or DEXA test (dual-energy X-ray absorptiometry test). This test uses X-rays to measure the amount of minerals in your bones. How is this treated? The goal of treatment is to strengthen your bones and lower your risk for a fracture. Treatment may involve:  Making lifestyle changes, such as: ? Including foods with more calcium and vitamin D in your diet. ? Doing weight-bearing and muscle-strengthening exercises. ? Stopping tobacco use. ? Limiting alcohol intake.  Taking medicine to slow the process of bone loss or to increase bone density.  Taking daily supplements of calcium and vitamin D.  Taking hormone replacement medicines, such as estrogen for women and testosterone for men.  Monitoring your levels of calcium and vitamin D. Follow these instructions at home:  Activity  Exercise as told by your health care provider. Ask your health care provider what exercises and activities are safe for you. You should do: ? Exercises that make you work against gravity (weight-bearing exercises), such as tai chi, yoga, or walking. ? Exercises to strengthen muscles, such as lifting weights. Lifestyle  Limit alcohol intake to no more than 1 drink a day for  nonpregnant women and 2 drinks a day for men. One drink equals 12 oz of beer, 5 oz of wine, or 1 oz of hard liquor.  Do not use any products that contain nicotine or tobacco, such as cigarettes and e-cigarettes. If you need help quitting, ask your health care provider. Preventing falls  Use devices to help you move around (mobility aids) as needed, such as canes, walkers, scooters, or crutches.  Keep rooms well-lit and clutter-free.  Remove tripping hazards from walkways, including cords and throw rugs.  Install grab bars  in bathrooms and safety rails on stairs.  Use rubber mats in the bathroom and other areas that are often wet or slippery.  Wear closed-toe shoes that fit well and support your feet. Wear shoes that have rubber soles or low heels.  Review your medicines with your health care provider. Some medicines can cause dizziness or changes in blood pressure, which can increase your risk of falling. General instructions  Include calcium and vitamin D in your diet. Calcium is important for bone health, and vitamin D helps your body to absorb calcium. Good sources of calcium and vitamin D include: ? Certain fatty fish, such as salmon and tuna. ? Products that have calcium and vitamin D added to them (fortified products), such as fortified cereals. ? Egg yolks. ? Cheese. ? Liver.  Take over-the-counter and prescription medicines only as told by your health care provider.  Keep all follow-up visits as told by your health care provider. This is important. Contact a health care provider if:  You have never been screened for osteoporosis and you are: ? A woman who is age 82 or older. ? A man who is age 109 or older. Get help right away if:  You fall or injure yourself. Summary  Osteoporosis is thinning and loss of density in your bones. This makes bones more brittle and fragile and more likely to break (fracture),even with minor falls.  The goal of treatment is to strengthen your bones and reduce your risk for a fracture.  Include calcium and vitamin D in your diet. Calcium is important for bone health, and vitamin D helps your body to absorb calcium.  Talk with your health care provider about screening for osteoporosis if you are a woman who is age 34 or older, or a man who is age 79 or older. This information is not intended to replace advice given to you by your health care provider. Make sure you discuss any questions you have with your health care provider. Document Revised: 05/08/2017 Document  Reviewed: 03/20/2017 Elsevier Patient Education  2020 [employer].

## 2020-06-15 LAB — HEPATITIS C ANTIBODY
Hepatitis C Ab: NONREACTIVE
SIGNAL TO CUT-OFF: 0.01 (ref <1.00)

## 2020-08-06 ENCOUNTER — Other Ambulatory Visit: Payer: Self-pay

## 2020-08-06 ENCOUNTER — Encounter: Payer: Self-pay | Admitting: Family Medicine

## 2020-09-03 ENCOUNTER — Encounter: Payer: Self-pay | Admitting: Family Medicine

## 2020-09-03 ENCOUNTER — Telehealth: Payer: Self-pay | Admitting: Family Medicine

## 2020-09-03 NOTE — Telephone Encounter (Signed)
See my chart note.

## 2020-09-19 DIAGNOSIS — Z23 Encounter for immunization: Secondary | ICD-10-CM | POA: Diagnosis not present

## 2020-09-28 ENCOUNTER — Other Ambulatory Visit: Payer: Medicare Other

## 2020-10-08 DIAGNOSIS — B351 Tinea unguium: Secondary | ICD-10-CM | POA: Diagnosis not present

## 2020-10-22 IMAGING — MG MM DIGITAL SCREENING BILAT W/ TOMO W/ CAD
6 of 10 series · 6 of 30 positions shown · non-contrast
Comparison: Previous exam(s).

CLINICAL DATA: Screening.

EXAM:
DIGITAL SCREENING BILATERAL MAMMOGRAM WITH TOMO AND CAD

[R XCCL synth-2D]
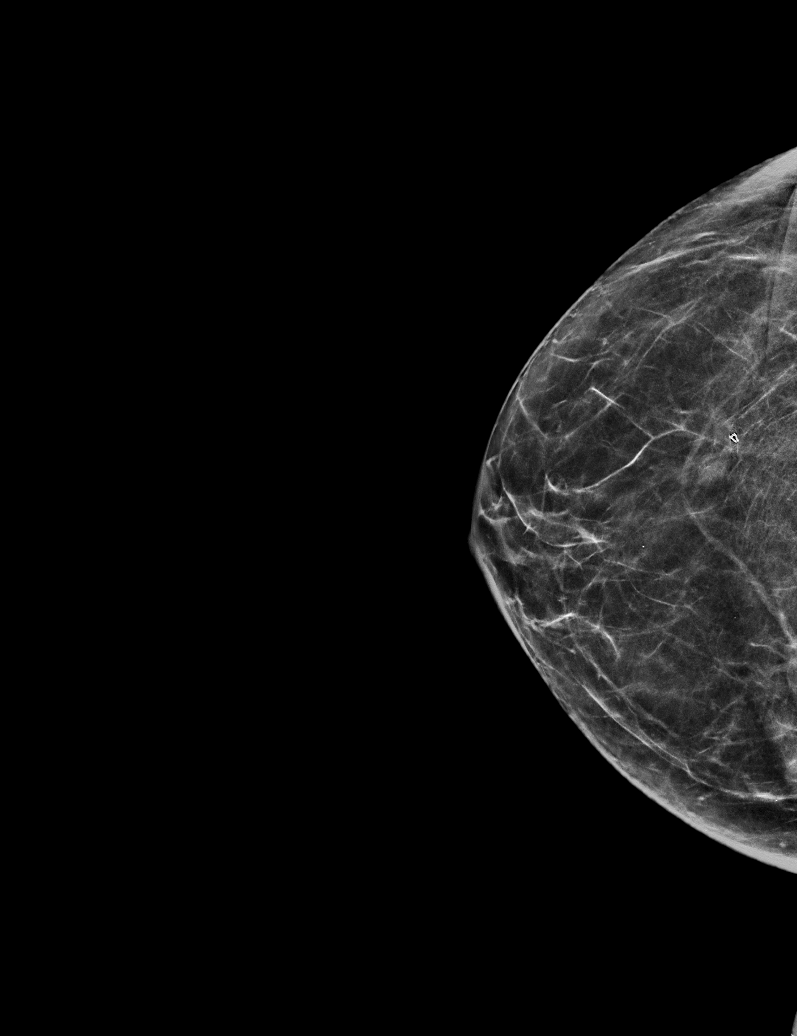

[L MLO synth-2D]
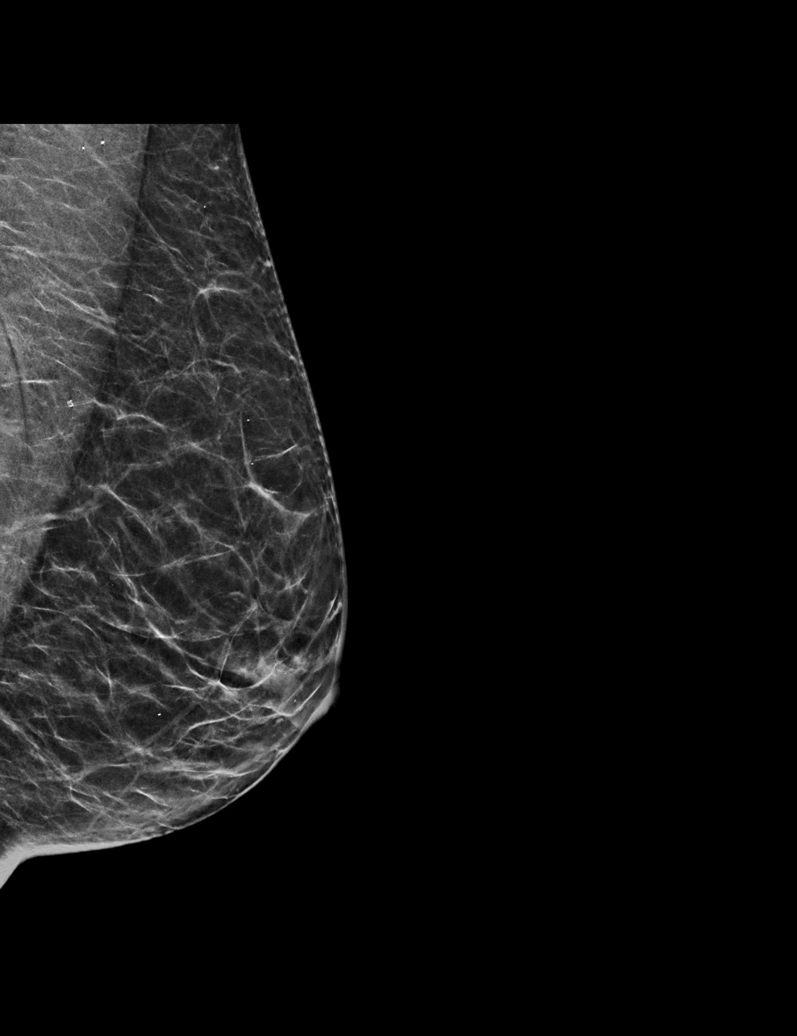

[R MLO synth-2D]
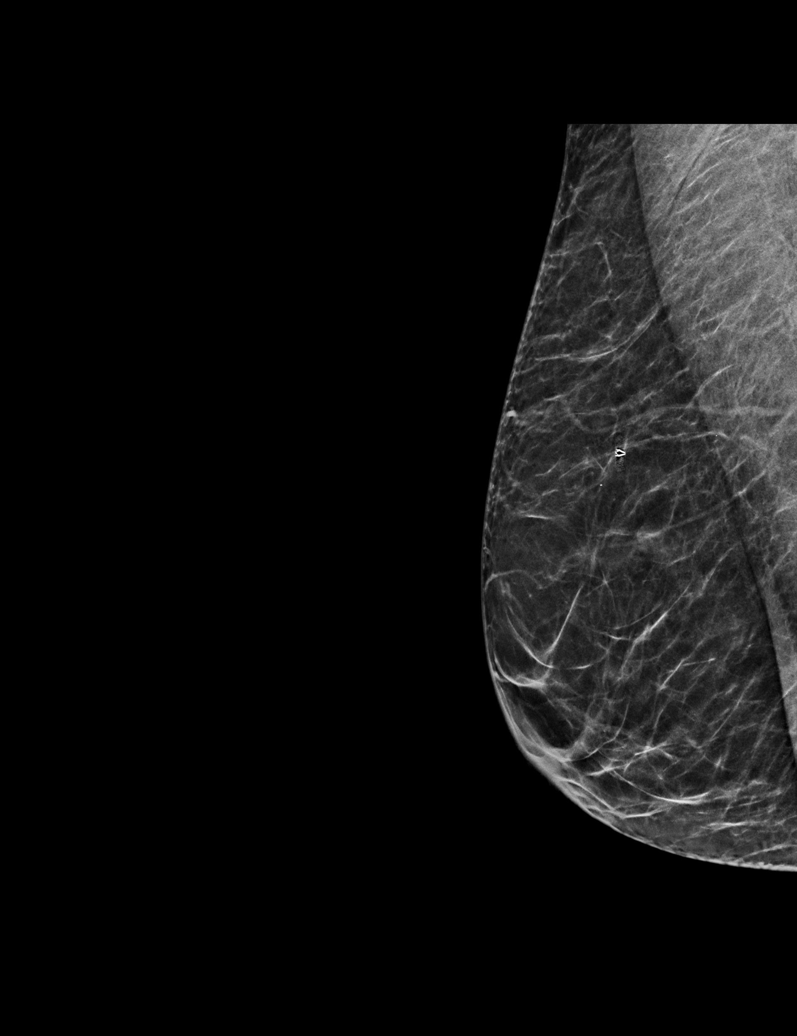

[L CC synth-2D]
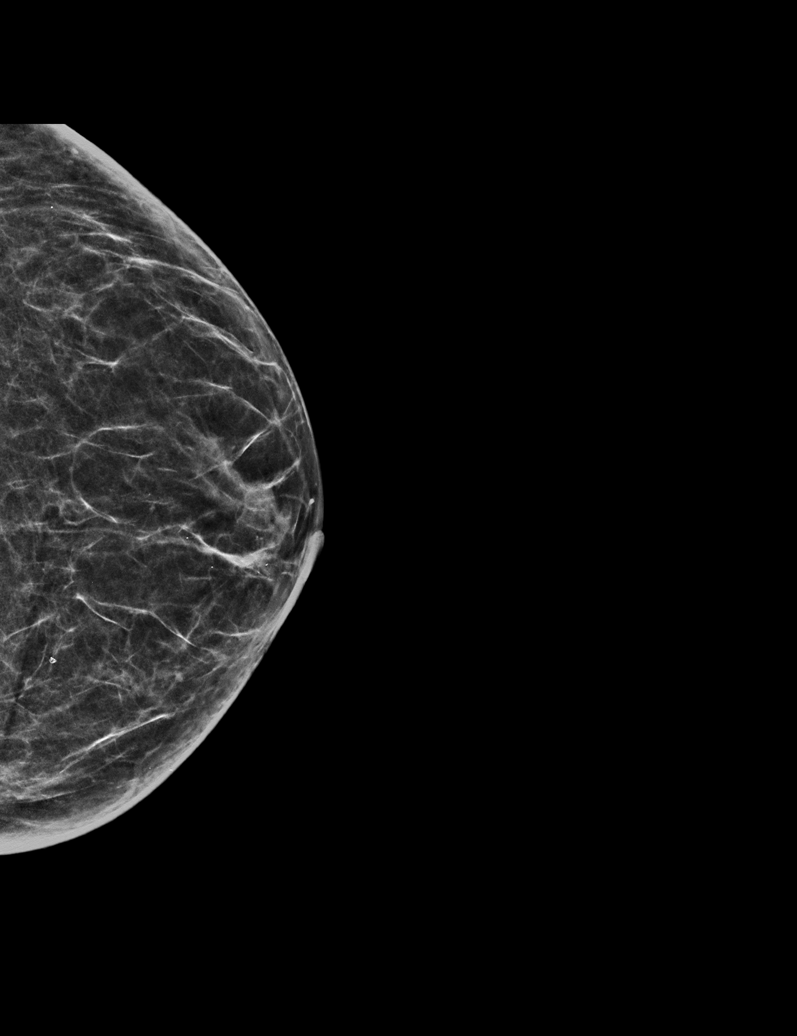

[R CC synth-2D]
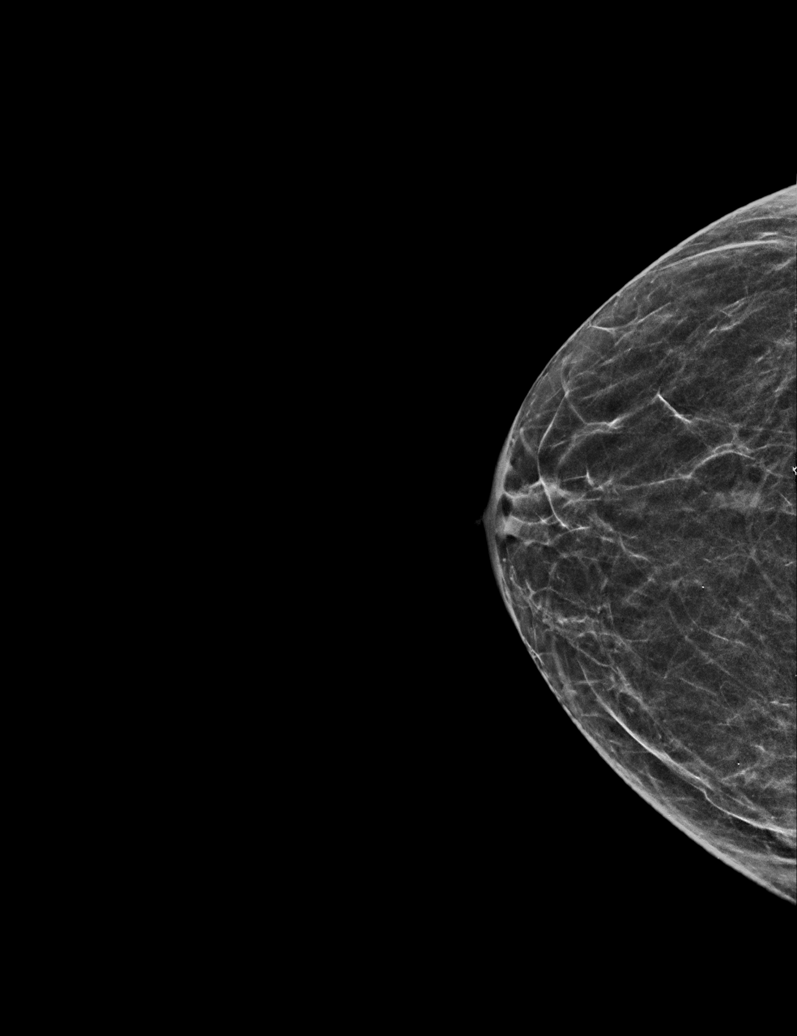

[R MLO tomo · tomo slice 25/48.0]
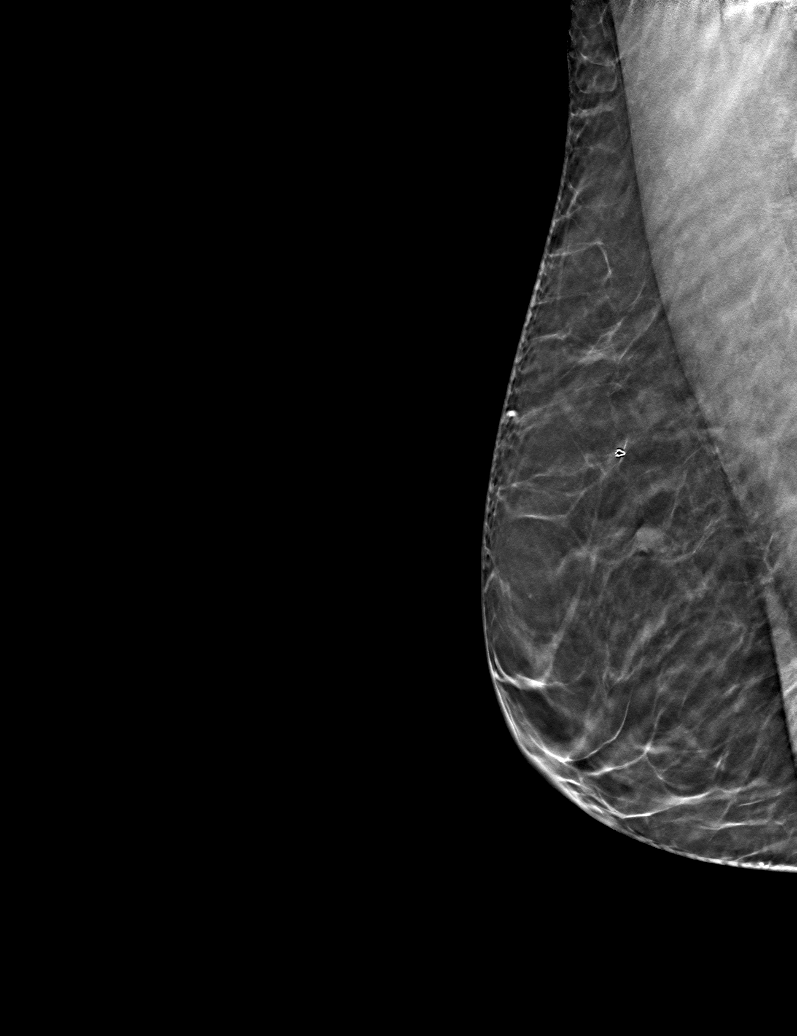

[6 of 30 positions shown; findings below may reference images not displayed]

ACR Breast Density Category b: There are scattered areas of
fibroglandular density.
FINDINGS: There are no findings suspicious for malignancy. Images were
processed with CAD.
IMPRESSION: No mammographic evidence of malignancy. A result letter of this
screening mammogram will be mailed directly to the patient.

RECOMMENDATION:
Screening mammogram in one year. (Code:[id number])

BI-RADS CATEGORY  1: Negative.

## 2020-10-30 ENCOUNTER — Other Ambulatory Visit: Payer: Self-pay

## 2020-10-30 ENCOUNTER — Ambulatory Visit (HOSPITAL_BASED_OUTPATIENT_CLINIC_OR_DEPARTMENT_OTHER)
Admission: RE | Admit: 2020-10-30 | Discharge: 2020-10-30 | Disposition: A | Discharge status: Home / Self Care | Payer: Medicare Other | Source: Ambulatory Visit | Attending: Family Medicine | Admitting: Family Medicine

## 2020-10-30 DIAGNOSIS — M810 Age-related osteoporosis without current pathological fracture: Secondary | ICD-10-CM | POA: Diagnosis not present

## 2020-10-30 DIAGNOSIS — Z780 Asymptomatic menopausal state: Secondary | ICD-10-CM | POA: Diagnosis not present

## 2020-11-07 ENCOUNTER — Encounter: Payer: Self-pay | Admitting: Family Medicine

## 2020-11-07 NOTE — Progress Notes (Signed)
Please call patient: I have reviewed his/her lab results. Bone density shows osteoporosis now; this has progressed over the last 2 years.  Exercise, calcium and vit D are recommended, but now we can offer treatment for osteoporosis. I recommend fosamax once weekly as discussed at your physical. Can schedule office visit if she'd like to discuss treatment options and management further. Thanks.

## 2020-11-21 ENCOUNTER — Encounter: Payer: Self-pay | Admitting: Family Medicine

## 2020-12-03 ENCOUNTER — Other Ambulatory Visit: Payer: Self-pay | Admitting: Family Medicine

## 2020-12-03 DIAGNOSIS — F5101 Primary insomnia: Secondary | ICD-10-CM

## 2020-12-03 NOTE — Telephone Encounter (Signed)
Received and reviewed medication refill request.  Request is appropriate and was approved.  Please see medication orders for details.  

## 2020-12-21 ENCOUNTER — Ambulatory Visit: Payer: Medicare Other | Admitting: Family Medicine

## 2020-12-26 DIAGNOSIS — H2511 Age-related nuclear cataract, right eye: Secondary | ICD-10-CM | POA: Diagnosis not present

## 2020-12-26 DIAGNOSIS — H25013 Cortical age-related cataract, bilateral: Secondary | ICD-10-CM | POA: Diagnosis not present

## 2020-12-26 DIAGNOSIS — H04123 Dry eye syndrome of bilateral lacrimal glands: Secondary | ICD-10-CM | POA: Diagnosis not present

## 2020-12-26 DIAGNOSIS — H25043 Posterior subcapsular polar age-related cataract, bilateral: Secondary | ICD-10-CM | POA: Diagnosis not present

## 2020-12-26 DIAGNOSIS — H2513 Age-related nuclear cataract, bilateral: Secondary | ICD-10-CM | POA: Diagnosis not present

## 2020-12-31 ENCOUNTER — Encounter: Payer: Self-pay | Admitting: Family Medicine

## 2021-01-02 ENCOUNTER — Telehealth: Payer: Medicare Other | Admitting: Physician Assistant

## 2021-01-02 ENCOUNTER — Encounter: Payer: Self-pay | Admitting: Family Medicine

## 2021-01-02 DIAGNOSIS — U071 COVID-19: Secondary | ICD-10-CM

## 2021-01-02 DIAGNOSIS — I2510 Atherosclerotic heart disease of native coronary artery without angina pectoris: Secondary | ICD-10-CM | POA: Diagnosis not present

## 2021-01-02 MED ORDER — MOLNUPIRAVIR EUA 200MG CAPSULE
4.0000 | ORAL_CAPSULE | Freq: Two times a day (BID) | ORAL | 0 refills | Status: AC
Start: 2021-01-02 — End: 2021-01-07

## 2021-01-02 MED ORDER — BENZONATATE 100 MG PO CAPS
100.0000 mg | ORAL_CAPSULE | Freq: Three times a day (TID) | ORAL | 0 refills | Status: DC | PRN
Start: 2021-01-02 — End: 2021-01-15

## 2021-01-02 NOTE — Patient Instructions (Signed)
  Evelyn Jordan, thank you for joining [name], PA-C for today's virtual visit.  While this provider is not your primary care provider (PCP), if your PCP is located in our provider database this encounter information will be shared with them immediately following your visit.  Consent: (Patient) Evelyn Jordan provided verbal consent for this virtual visit at the beginning of the encounter.  Current Medications:  Current Outpatient Medications:    ALPRAZolam (XANAX) 0.5 MG tablet, TAKE 1 TABLET(0.5 MG) BY MOUTH DAILY AS NEEDED FOR ANXIETY, Disp: 30 tablet, Rfl: 0   hydrocortisone (ANUSOL-HC) 25 MG suppository, Place 1 suppository (25 mg total) rectally 2 (two) times daily., Disp: 12 suppository, Rfl: 0   zolpidem (AMBIEN) 5 MG tablet, TAKE 1 TABLET(5 MG) BY MOUTH AT BEDTIME AS NEEDED FOR SLEEP, Disp: 30 tablet, Rfl: 5   Medications ordered in this encounter:  No orders of the defined types were placed in this encounter.    *If you need refills on other medications prior to your next appointment, please contact your pharmacy*  Follow-Up: Call back or seek an in-person evaluation if the symptoms worsen or if the condition fails to improve as anticipated.  Other Instructions Please keep well-hydrated and get plenty of rest. Start a saline nasal rinse to flush out your nasal passages. You can use plain Mucinex to help thin congestion. If you have a humidifier, running in the bedroom at night. I want you to start OTC vitamin D3 1000 units daily, vitamin C 1000 mg daily, and a zinc supplement. Please take prescribed medications as directed.  You have been enrolled in a MyChart symptom monitoring program. Please answer these questions daily so we can keep track of how you are doing.  You were to quarantine for 5 days from onset of your symptoms.  After day 5, if you have had no fever and you are feeling better, you can end quarantine but need to mask for an additional 5  days. After day 5 if you have a fever or are having significant symptoms, please quarantine for full 10 days.  If you note any worsening of symptoms, any significant shortness of breath or any chest pain, please seek ER evaluation ASAP.  Please do not delay care!    If you have been instructed to have an in-person evaluation today at a local Urgent Care facility, please use the link below. It will take you to a list of all of our available [facility], including address, phone number and hours of operation. Please do not delay care.  [facility]  If you or a family member do not have a primary care provider, use the link below to schedule a visit and establish care. When you choose a [facility] primary care physician or advanced practice provider, you gain a long-term partner in health. Find a Primary Care Provider  Learn more about [facility]'s in-office and virtual care options: [facility]

## 2021-01-02 NOTE — Progress Notes (Signed)
Virtual Visit Consent   Evelyn Jordan, you are scheduled for a virtual visit with a [facility] provider today.     Just as with appointments in the office, your consent must be obtained to participate.  Your consent will be active for this visit and any virtual visit you may have with one of our providers in the next 365 days.     If you have a MyChart account, a copy of this consent can be sent to you electronically.  All virtual visits are billed to your insurance company just like a traditional visit in the office.    As this is a virtual visit, video technology does not allow for your provider to perform a traditional examination.  This may limit your provider's ability to fully assess your condition.  If your provider identifies any concerns that need to be evaluated in person or the need to arrange testing (such as labs, EKG, etc.), we will make arrangements to do so.     Although advances in technology are sophisticated, we cannot ensure that it will always work on either your end or our end.  If the connection with a video visit is poor, the visit may have to be switched to a telephone visit.  With either a video or telephone visit, we are not always able to ensure that we have a secure connection.     I need to obtain your verbal consent now.   Are you willing to proceed with your visit today?    Evelyn Jordan has provided verbal consent on 01/02/2021 for a virtual visit (video or telephone).   [name], [place]   Date: 01/02/2021 4:09 PM   Virtual Visit via Video Note   I, [name], connected with  Evelyn Jordan  ([id number], 05/03/1951) on 01/02/21 at  4:00 PM EDT by a video-enabled telemedicine application and verified that I am speaking with the correct person using two identifiers.  Location: Patient: Virtual Visit Location Patient: Home Provider: Virtual Visit Location Provider: Home Office   I discussed the limitations of evaluation and management  by telemedicine and the availability of in person appointments. The patient expressed understanding and agreed to proceed.    History of Present Illness: Evelyn Jordan is a 70 y.o. who identifies as a female who was assigned female at birth, and is being seen today for COVID-19. Endorses symptoms starting Monday with sore throat, nasal congestion and fever (Tmax < 101). Denies chest congestion, chest pain or shortness of breath. Denies GI symptoms. Daughter now with similar symptoms. She tested positive yesterday for COVID. Has been taking Coricidin HBP, Vitamin C.   HPI: HPI  Problems:  Patient Active Problem List   Diagnosis Date Noted   Pulmonary nodules 04/14/2019   Thyroid nodule 04/14/2019   Synovial cyst of popliteal space 04/05/2018   Osteoporosis 12/23/2017   Primary insomnia 12/04/2017    Allergies:  Allergies  Allergen Reactions   Compazine [Prochlorperazine Edisylate]     Neck hyperextends   Medications:  Current Outpatient Medications:    benzonatate (TESSALON) 100 MG capsule, Take 1 capsule (100 mg total) by mouth 3 (three) times daily as needed for cough., Disp: 30 capsule, Rfl: 0   molnupiravir EUA 200 mg CAPS, Take 4 capsules (800 mg total) by mouth 2 (two) times daily for 5 days., Disp: 40 capsule, Rfl: 0   zolpidem (AMBIEN) 5 MG tablet, TAKE 1 TABLET(5 MG) BY MOUTH AT BEDTIME AS NEEDED  FOR SLEEP, Disp: 30 tablet, Rfl: 5  Observations/Objective: Patient is well-developed, well-nourished in no acute distress.  Resting comfortably at home.  Head is normocephalic, atraumatic.  No labored breathing. Speech is clear and coherent with logical content.  Patient is alert and oriented at baseline.   Assessment and Plan: 1. COVID-19 - benzonatate (TESSALON) 100 MG capsule; Take 1 capsule (100 mg total) by mouth 3 (three) times daily as needed for cough.  Dispense: 30 capsule; Refill: 0 - molnupiravir EUA 200 mg CAPS; Take 4 capsules (800 mg total) by mouth 2 (two)  times daily for 5 days.  Dispense: 40 capsule; Refill: 0 - MyChart COVID-19 home monitoring program; Future Patient with multiple risk factors for complicated course of illness. Discussed risks/benefits of antiviral medications including most common potential ADRs. Patient voiced understanding and would like to proceed with antiviral medication. They are candidate for molnupiravir. Rx sent to pharmacy. Supportive measures, OTC medications and vitamin regimen reviewed. Tessalon per orders. Patient has been enrolled in a MyChart COVID symptom monitoring program. Samule Dry reviewed in detail. Strict ER precautions discussed with patient.    Follow Up Instructions: I discussed the assessment and treatment plan with the patient. The patient was provided an opportunity to ask questions and all were answered. The patient agreed with the plan and demonstrated an understanding of the instructions.  A copy of instructions were sent to the patient via MyChart.  The patient was advised to call back or seek an in-person evaluation if the symptoms worsen or if the condition fails to improve as anticipated.  Time:  I spent 12 minutes with the patient via telehealth technology discussing the above problems/concerns.    [name], PA-C

## 2021-01-07 DIAGNOSIS — Z20822 Contact with and (suspected) exposure to covid-19: Secondary | ICD-10-CM | POA: Diagnosis not present

## 2021-01-14 ENCOUNTER — Encounter: Payer: Self-pay | Admitting: Family Medicine

## 2021-01-14 ENCOUNTER — Telehealth: Payer: Self-pay | Admitting: Family Medicine

## 2021-01-14 NOTE — Telephone Encounter (Signed)
Nurse contacted pt due to several BPAs on [place]. Pt states that she was positive mid to late July. She states she then tested negative, however, today she feels bad again and did a home test and it is positive. Pt asked if she could take antibiotics again. I only see Tessalon on her current meds. If patient begins to feel worse she will call in for virtual  MD visit.

## 2021-01-15 ENCOUNTER — Telehealth: Payer: Medicare Other | Admitting: Physician Assistant

## 2021-01-15 DIAGNOSIS — I2510 Atherosclerotic heart disease of native coronary artery without angina pectoris: Secondary | ICD-10-CM | POA: Diagnosis not present

## 2021-01-15 DIAGNOSIS — U071 COVID-19: Secondary | ICD-10-CM | POA: Diagnosis not present

## 2021-01-15 NOTE — Patient Instructions (Signed)
  Evelyn Jordan, thank you for joining [name], PA-C for today's virtual visit.  While this provider is not your primary care provider (PCP), if your PCP is located in our provider database this encounter information will be shared with them immediately following your visit.  Consent: (Patient) Evelyn Jordan provided verbal consent for this virtual visit at the beginning of the encounter.  Current Medications:  Current Outpatient Medications:    zolpidem (AMBIEN) 5 MG tablet, TAKE 1 TABLET(5 MG) BY MOUTH AT BEDTIME AS NEEDED FOR SLEEP, Disp: 30 tablet, Rfl: 5   Medications ordered in this encounter:  No orders of the defined types were placed in this encounter.    *If you need refills on other medications prior to your next appointment, please contact your pharmacy*  Follow-Up: Call back or seek an in-person evaluation if the symptoms worsen or if the condition fails to improve as anticipated.  Other Instructions You need to remain well-hydrated and try to get plenty of rest. I recommend you restart Vitamin D3 1000, Vitamin C 1000 mg daily and a zinc supplement.  Ok at this point to alternate tylenol and ibuprofen for sore throat if needed. Salt-water gargles and chloraseptic spray can be beneficial.  You are to restart 5 days of quarantine, retesting at end of quarantine to make sure you are negative at that time.  If there are any new or worsening symptoms, you will need an evaluation with your primary care provider, or to be seen at an Urgent Care or ER facility.  I did speak with my supervising MD and also with input from our infectious disease/pulmonology group who would not recommend repeat antivirals in this case.    If you have been instructed to have an in-person evaluation today at a local Urgent Care facility, please use the link below. It will take you to a list of all of our available [facility], including address, phone number and hours of  operation. Please do not delay care.  [facility]  If you or a family member do not have a primary care provider, use the link below to schedule a visit and establish care. When you choose a [facility] primary care physician or advanced practice provider, you gain a long-term partner in health. Find a Primary Care Provider  Learn more about [facility]'s in-office and virtual care options: [facility] Now

## 2021-01-15 NOTE — Progress Notes (Signed)
Virtual Visit Consent   Evelyn Jordan, you are scheduled for a virtual visit with a [facility] provider today.     Just as with appointments in the office, your consent must be obtained to participate.  Your consent will be active for this visit and any virtual visit you may have with one of our providers in the next 365 days.     If you have a MyChart account, a copy of this consent can be sent to you electronically.  All virtual visits are billed to your insurance company just like a traditional visit in the office.    As this is a virtual visit, video technology does not allow for your provider to perform a traditional examination.  This may limit your provider's ability to fully assess your condition.  If your provider identifies any concerns that need to be evaluated in person or the need to arrange testing (such as labs, EKG, etc.), we will make arrangements to do so.     Although advances in technology are sophisticated, we cannot ensure that it will always work on either your end or our end.  If the connection with a video visit is poor, the visit may have to be switched to a telephone visit.  With either a video or telephone visit, we are not always able to ensure that we have a secure connection.     I need to obtain your verbal consent now.   Are you willing to proceed with your visit today?    [name] has provided verbal consent on 01/15/2021 for a virtual visit (video or telephone).   [name], [place]   Date: 01/15/2021 10:29 AM   Virtual Visit via Video Note   I, [name], connected with  [name]  ([id number], 05/08/1951) on 01/15/21 at 10:15 AM EDT by a video-enabled telemedicine application and verified that I am speaking with the correct person using two identifiers.  Location: Patient: Virtual Visit Location Patient: Home Provider: Virtual Visit Location Provider: Home Office   I discussed the limitations of evaluation and management  by telemedicine and the availability of in person appointments. The patient expressed understanding and agreed to proceed.    History of Present Illness: ROSLAND Jordan is a 70 y.o. who identifies as a female who was assigned female at birth, and is being seen today for possible rebound COVID-19. Patient was initially diagnosed via video visit by this provider on 01/02/2021 at which time she was placed on molnupiravir, benzonatate and given instructions regarding OTC medications, vitamins and supportive measures. She notes completing entire course of antiviral with complete resolution of symptoms within 6 days of symptom onset. Has felt fine since then up until early this morning when she noted significant sore throat and anorexia. Denies fever, chills, congestion, SOB, aches, GI symptoms. Took another COVID test which was positive. She does note taking a prior test after ending her initial quarantine for COVID which was negative. She is concerned about rebound COVID and is wondering what she should do. Denies any dysphagia 2/2 sore throat just mild odynophagia. Had reached out to PCP office but has not heard back from them via MyChart yet.   HPI: HPI  Problems:  Patient Active Problem List   Diagnosis Date Noted   Pulmonary nodules 04/14/2019   Thyroid nodule 04/14/2019   Synovial cyst of popliteal space 04/05/2018   Osteoporosis 12/23/2017   Primary insomnia 12/04/2017    Allergies:  Allergies  Allergen Reactions   Compazine [Prochlorperazine Edisylate]     Neck hyperextends   Medications:  Current Outpatient Medications:    zolpidem (AMBIEN) 5 MG tablet, TAKE 1 TABLET(5 MG) BY MOUTH AT BEDTIME AS NEEDED FOR SLEEP, Disp: 30 tablet, Rfl: 5  Observations/Objective: Patient is well-developed, well-nourished in no acute distress.  Resting comfortably on bed at home.  Head is normocephalic, atraumatic.  No labored breathing. Speech is clear and coherent with logical content.  Patient is  alert and oriented at baseline.   Assessment and Plan: 1. COVID-19  Very possible mild rebound, especially giving testing positive after prior negative testing at end of initial quarantine. Mild symptoms thankfully without any alarms signs/symptoms. Giving this supportive measures, OTC medications again reviewed with patient for symptomatic control. She is to restart a 5-day quarantine per CDC/Up-to-date recommendations and retest at end of quarantine even if symptoms are resolved. Discussed antivirals with supervising MD, Dr. Noemi Chapel with input from our Infectious Disease/COVID-team. Notes no indication for antiviral in this case. She will need in-office evaluation for any new or worsening symptoms. Patient made aware.   Follow Up Instructions: I discussed the assessment and treatment plan with the patient. The patient was provided an opportunity to ask questions and all were answered. The patient agreed with the plan and demonstrated an understanding of the instructions.  A copy of instructions were sent to the patient via MyChart.  The patient was advised to call back or seek an in-person evaluation if the symptoms worsen or if the condition fails to improve as anticipated.  Time:  I spent 15 minutes with the patient via telehealth technology discussing the above problems/concerns.    [name], PA-C

## 2021-02-18 ENCOUNTER — Encounter: Payer: Self-pay | Admitting: Family Medicine

## 2021-02-18 ENCOUNTER — Ambulatory Visit (INDEPENDENT_AMBULATORY_CARE_PROVIDER_SITE_OTHER): Payer: Medicare Other | Admitting: Family Medicine

## 2021-02-18 ENCOUNTER — Other Ambulatory Visit: Payer: Self-pay

## 2021-02-18 VITALS — BP 134/86 | HR 70 | Temp 98.3°F | Ht 63.0 in | Wt 120.0 lb

## 2021-02-18 DIAGNOSIS — F321 Major depressive disorder, single episode, moderate: Secondary | ICD-10-CM | POA: Diagnosis not present

## 2021-02-18 DIAGNOSIS — Z20822 Contact with and (suspected) exposure to covid-19: Secondary | ICD-10-CM | POA: Diagnosis not present

## 2021-02-18 MED ORDER — ALPRAZOLAM 0.5 MG PO TABS: 0.5000 mg | ORAL_TABLET | Freq: Every day | ORAL | 0 refills | Status: DC | PRN

## 2021-02-18 MED ORDER — SERTRALINE HCL 25 MG PO TABS: ORAL_TABLET | ORAL | 1 refills | Status: DC

## 2021-02-18 NOTE — Progress Notes (Signed)
Subjective  CC:  Chief Complaint  Patient presents with   Depression   Anxiety    On going for a few years    HPI: Evelyn Jordan is a 70 y.o. female who presents to the office today to address the problems listed above in the chief complaint, mood problems. 70 yo with sxs of depression as noted below. Has worsened over the last year. We had discussed her anxiety reaction to her daughter's divorce in the past 2 years and she was treated with prn xanax. However, now with worsening sxs. Her same daughter is now engaged to another person but this relationship is not good per pt. This stresse the pt. Also her other daughter has some mental health issues and her financial problems stress the patient. At this point, pt is fatigued, sleeps too much, has self negativity, anhedonia, decreased energy and apathy. She reports it has affected all of her familial relationships including that with her husband. Her husband is not supportive of medications. Pt has been to counseling which is ongoing.  She admits to having had thoughts of not wanting to continue this living but  She denies current suicidal or homicidal plan or intent. She says she would not act on those thoughts.  She has never before been diagnosed with a mood disorder.  Depression screen [facility] 2/9 02/18/2021 06/14/2020 04/05/2020  Decreased Interest 2 0 0  Down, Depressed, Hopeless 2 0 0  PHQ - 2 Score 4 0 0  Altered sleeping 0 - -  Tired, decreased energy 2 - -  Change in appetite 0 - -  Feeling bad or failure about yourself  2 - -  Trouble concentrating 2 - -  Moving slowly or fidgety/restless 2 - -  Suicidal thoughts 0 - -  PHQ-9 Score 12 - -  Difficult doing work/chores Very difficult - -   GAD 7 : Generalized Anxiety Score 02/18/2021 11/29/2018  Nervous, Anxious, on Edge 2 1  Control/stop worrying 2 1  Worry too much - different things 2 1  Trouble relaxing 2 1  Restless 2 1  Easily annoyed or irritable 2 1  Afraid - awful might  happen 0 0  Total GAD 7 Score 12 6  Anxiety Difficulty Very difficult Somewhat difficult    Previously on prescription medications for mood/anxiety: prn xanax.  Therapist/counseling:currently Previous Diagnosis of psychiatric disorder: never, adjustment anxiety mild Family history of psychiatric disorder: duaghter with bipolar  Assessment  1. Depression, major, single episode, moderate (HCC)      Plan  Depression:  major, moderate and 1st episode Spent time educated and counseling. Confirming diagnosis and discussing treatment options. Recommend continuation of counseling, starting ssri: zoloft and prn xanax if needed. She understands expectations of SSRI, possible side effects, appropriate use of xanax and close f/u needed.  Reviewed concept of mood problems caused by biochemical imbalance of neurotransmitters and rationale for treatment with medications and therapy.  Counseling given: pt was instructed to contact office, on-call physician or crisis Hotline if symptoms worsen significantly. If patient develops any suicidal or homicidal thoughts, she is directed to the ER immediately.  I spent a total of 32 minutes for this patient encounter. Time spent included preparation, face-to-face counseling with the patient and coordination of care, review of chart and records, and documentation of the encounter.  Follow up: No follow-ups on file.  No orders of the defined types were placed in this encounter.  Meds ordered this encounter  Medications  sertraline (ZOLOFT) 25 MG tablet    Sig: Take 1 tablet (25 mg total) by mouth daily for 14 days, THEN 2 tablets (50 mg total) daily for 14 days.    Dispense:  42 tablet    Refill:  1   ALPRAZolam (XANAX) 0.5 MG tablet    Sig: Take 1 tablet (0.5 mg total) by mouth daily as needed for anxiety.    Dispense:  20 tablet    Refill:  0       I reviewed the patients updated PMH, FH, and SocHx.    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   sertraline (ZOLOFT) 25 MG tablet Take 1 tablet (25 mg total) by mouth daily for 14 days, THEN 2 tablets (50 mg total) daily for 14 days.   zolpidem (AMBIEN) 5 MG tablet TAKE 1 TABLET(5 MG) BY MOUTH AT BEDTIME AS NEEDED FOR SLEEP    Allergies: Patient is allergic to compazine [prochlorperazine edisylate]. Family history:  Patient family history includes Alzheimer's disease in her mother; Hypertension in her father; Stroke in her father. Social History   Socioeconomic History   Marital status: Married    Spouse name: Not on file   Number of children: Not on file   Years of education: Not on file   Highest education level: Not on file  Occupational History    Comment: retired Marine scientist   Tobacco Use   Smoking status: Former   Smokeless tobacco: Never   Tobacco comments:    quit in 2011  Vaping Use   Vaping Use: Never used  Substance and Sexual Activity   Alcohol use: Yes    Comment: Rarely   Drug use: No   Sexual activity: Not on file  Other Topics Concern   Not on file  Social History Narrative   Not on file   Social Determinants of Health   Financial Resource Strain: Low Risk    Difficulty of Paying Living Expenses: Not hard at all  Food Insecurity: No Food Insecurity   Worried About Charity fundraiser in the Last Year: Never true   [place] in the Last Year: Never true  Transportation Needs: No Transportation Needs   Lack of Transportation (Medical): No   Lack of Transportation (Non-Medical): No  Physical Activity: Insufficiently Active   Days of Exercise per Week: 5 days   Minutes of Exercise per Session: 20 min  Stress: No Stress Concern Present   Feeling  of Stress : Not at all  Social Connections: Moderately Isolated   Frequency of Communication with Friends and Family: More than three times a week   Frequency of Social Gatherings with Friends and Family: Twice a week   Attends Religious Services: Never   Marine scientist or Organizations: No   Attends Music therapist: Never   Marital Status: Married     Review of Systems: Constitutional: Negative for fever malaise or anorexia Cardiovascular: negative for chest pain Respiratory: negative for SOB or persistent cough Gastrointestinal: negative for abdominal pain  Objective  Vitals: BP 134/86   Pulse 70   Temp 98.3 F (36.8 C) (Temporal)  Ht 5\' 3"  (1.6 m)   Wt 120 lb (54.4 kg)   SpO2 96%   BMI 21.26 kg/m  General: no acute distress, well appearing, no apparent distress, well groomed Psych:  Alert and oriented x 3,. depressed affect, flat affect, and hypomanic, good insight    Commons side effects, risks, benefits, and alternatives for medications and treatment plan prescribed today were discussed, and the patient expressed understanding of the given instructions. Patient is instructed to call or message via MyChart if he/she has any questions or concerns regarding our treatment plan. No barriers to understanding were identified. We discussed Red Flag symptoms and signs in detail. Patient expressed understanding regarding what to do in case of urgent or emergency type symptoms.  Medication list was reconciled, printed and provided to the patient in AVS. Patient instructions and summary information was reviewed with the patient as documented in the AVS. This note was prepared with assistance of Dragon voice recognition software. Occasional wrong-word or sound-a-like substitutions may have occurred due to the inherent limitations of voice recognition software

## 2021-02-18 NOTE — Patient Instructions (Signed)
Please return in 6 weeks to recheck mood  Please let me know if you have any problems or questions with the medications. They should be helpful.   If you have any questions or concerns, please don't hesitate to send me a message via MyChart or call the office at [phone]. Thank you for visiting with [place] today! It's our pleasure caring for you.    Depression Medications:  Taking the medicine as directed and not missing any doses is one of the best things you can do to treat your depression.  Here are some things to keep in mind:  Side effects (stomach upset, some increased anxiety) may happen before you notice a benefit.  These side effects typically go away over time. Changes to your dose of medicine or a change in medication all together is sometimes necessary Most people need to be on medication at least 6-12 months Many people will notice an improvement within two weeks but the full effect of the medication can take up to 4-6 weeks Stopping the medication when you start feeling better often results in a return of symptoms If you start having thoughts of hurting yourself or others after starting this medicine, please call the office immediately at [phone].    When you're feeling too down to do anything, try these 10 Little things!  Take a shower. Even if you plan to stay in all day long and not see a soul, take a shower. It takes the most effort to hop in to the shower but once you do, you'll feel immediate results. It will wake you up and you'll be feeling much fresher (and cleaner too).  Brush and floss your teeth. Give your teeth a good brushing with a floss finish. It's a small task but it feels so good and you can check 'taking care of your health' off the list of things to do.  Do something small on your list. Most of [place] have some small thing we would like to get done (load of laundry, sew a button, email a friend). Doing one of these things will make you feel like you've  accomplished something.  Drink water. Drinking water is easy right? It's also really beneficial for your health so keep a glass beside you all day and take sips often. It gives you energy and prevents you from boredom eating.  Do some floor exercises. The last thing you want to do is exercise but it might be just the thing you need the most. Keep it simple and do exercises that involve sitting or laying on the floor. Even the smallest of exercises release chemicals in the brain that make you feel good. Yoga stretches or core exercises are going to make you feel good with minimal effort.  Make your bed. Making your bed takes a few minutes but it's productive and you'll feel relieved when it's done. An unmade bed is a huge visual reminder that you're having an unproductive day. Do it and consider it your housework for the day.  Put on some nice clothes. Take the sweatpants off even if you don't plan to go anywhere. Put on clothes that make you feel good. Take a look in the mirror so your brain recognizes the sweatpants have been replaced with clothes that make you look great. It's an instant confidence booster.  Wash the dishes. A pile of dirty dishes in the sink is a reflection of your mood. It's possible that if you wash up the dishes, your mood  will follow suit. It's worth a try.  Cook a real meal. If you have the luxury to have a "do nothing" day, you have time to make a real meal for yourself. Make a meal that you love to eat. The process is good to get you out of the funk and the food will ensure you have more energy for tomorrow.  Write out your thoughts by hand. When you hand write, you stimulate your brain to focus on the moment that you're in so make yourself comfortable and write whatever comes into your mind. Put those thoughts out on paper so they stop spinning around in your head. Those thoughts might be the very thing holding you down.

## 2021-02-20 DIAGNOSIS — H35412 Lattice degeneration of retina, left eye: Secondary | ICD-10-CM | POA: Diagnosis not present

## 2021-03-06 DIAGNOSIS — Z23 Encounter for immunization: Secondary | ICD-10-CM | POA: Diagnosis not present

## 2021-03-29 ENCOUNTER — Other Ambulatory Visit: Payer: Self-pay | Admitting: Family Medicine

## 2021-03-29 ENCOUNTER — Encounter: Payer: Self-pay | Admitting: Family Medicine

## 2021-03-29 MED ORDER — SERTRALINE HCL 50 MG PO TABS: 50.0000 mg | ORAL_TABLET | Freq: Every day | ORAL | 1 refills | Status: DC

## 2021-03-31 ENCOUNTER — Encounter: Payer: Self-pay | Admitting: Family Medicine

## 2021-04-01 ENCOUNTER — Encounter: Payer: Self-pay | Admitting: Family Medicine

## 2021-04-01 NOTE — Telephone Encounter (Signed)
Can you please call pharmacy and ask to allow for early refill... for travel. OR do I have to send in another prescription?  thanks

## 2021-04-11 ENCOUNTER — Ambulatory Visit: Payer: Medicare Other

## 2021-04-12 ENCOUNTER — Ambulatory Visit: Payer: Medicare Other

## 2021-04-22 ENCOUNTER — Ambulatory Visit (INDEPENDENT_AMBULATORY_CARE_PROVIDER_SITE_OTHER): Payer: Medicare Other | Admitting: Family Medicine

## 2021-04-22 ENCOUNTER — Other Ambulatory Visit: Payer: Self-pay

## 2021-04-22 ENCOUNTER — Encounter: Payer: Self-pay | Admitting: Family Medicine

## 2021-04-22 VITALS — BP 122/72 | HR 68 | Temp 98.1°F | Ht 63.0 in | Wt 120.0 lb

## 2021-04-22 DIAGNOSIS — F5101 Primary insomnia: Secondary | ICD-10-CM

## 2021-04-22 DIAGNOSIS — F321 Major depressive disorder, single episode, moderate: Secondary | ICD-10-CM | POA: Diagnosis not present

## 2021-04-22 NOTE — Progress Notes (Signed)
I reiki session was longer than normal wait longer for this I discussed that note it was 2 hours continuingly long symptom was painful complete was great" well 74 call me if needed 1 symptoms.  Hopefully this great  Subjective  CC:  Chief Complaint  Patient presents with   Depression    Medication has helped    HPI: Evelyn Jordan is a 70 y.o. female who presents to the office today to address the problems listed above in the chief complaint, mood problems. 8-week follow-up after starting Zoloft for depression anxiety.  See last note.  Fortunately, she had a very good response.  No longer feeling poorly, down, depressed or having panic symptoms.  She is now able to not try to manage her daughter's stressors.  She had reports effects from the Zoloft.  See screenings below.  Depression screen [facility] 2/9 04/22/2021 02/18/2021 06/14/2020  Decreased Interest - 2 0  Down, Depressed, Hopeless 0 2 0  PHQ - 2 Score 0 4 0  Altered sleeping 3 0 -  Tired, decreased energy 0 2 -  Change in appetite 0 0 -  Feeling bad or failure about yourself  0 2 -  Trouble concentrating 0 2 -  Moving slowly or fidgety/restless 0 2 -  Suicidal thoughts 0 0 -  PHQ-9 Score 3 12 -  Difficult doing work/chores Not difficult at all Very difficult -   GAD 7 : Generalized Anxiety Score 04/22/2021 02/18/2021 11/29/2018  Nervous, Anxious, on Edge 0 2 1  Control/stop worrying 0 2 1  Worry too much - different things 0 2 1  Trouble relaxing 0 2 1  Restless 0 2 1  Easily annoyed or irritable 0 2 1  Afraid - awful might happen 0 0 0  Total GAD 7 Score 0 12 6  Anxiety Difficulty - Very difficult Somewhat difficult   Insomnia: On 5 mg Ambien as needed.  Notices that she wakes up after 4 to 5 hours.  She does feel that her sleep was worsened while she was suffering from mood problems.  No longer awakens worried.  Has not had another sleep medicines in the past.  Assessment  1. Depression, major, single episode, moderate ([place])    2. Primary insomnia      Plan  Depression: now well controlled on Zoloft 50mg  daily.  Educated on maintenance therapy. Insomnia: In part adjustment related: We will try to wean off Ambien.  If she has more problems, will return consider and we can trazodone.  Patient understands agrees with care plan  Follow up: 3 months for complete physical No orders of the defined types were placed in this encounter.  No orders of the defined types were placed in this encounter.     I reviewed the patients updated PMH, FH, and SocHx.    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   sertraline (ZOLOFT) 50 MG tablet Take 1 tablet (50 mg total) by mouth daily.   zolpidem (AMBIEN) 5 MG tablet TAKE 1 TABLET(5 MG) BY MOUTH AT BEDTIME AS NEEDED FOR SLEEP    Allergies: Patient is  allergic to compazine [prochlorperazine edisylate]. Family history:  Patient family history includes Alzheimer's disease in her mother; Hypertension in her father; Stroke in her father. Social History   Socioeconomic History   Marital status: Married    Spouse name: Not on file   Number of children: Not on file   Years of education: Not on file   Highest education level: Not on file  Occupational History    Comment: retired Marine scientist   Tobacco Use   Smoking status: Former   Smokeless tobacco: Never   Tobacco comments:    quit in 2011  Vaping Use   Vaping Use: Never used  Substance and Sexual Activity   Alcohol use: Yes    Comment: Rarely   Drug use: No   Sexual activity: Not on file  Other Topics Concern   Not on file  Social History Narrative    Not on file   Social Determinants of Health   Financial Resource Strain: Not on file  Food Insecurity: Not on file  Transportation Needs: Not on file  Physical Activity: Not on file  Stress: Not on file  Social Connections: Not on file     Review of Systems: Constitutional: Negative for fever malaise or anorexia Cardiovascular: negative for chest pain Respiratory: negative for SOB or persistent cough Gastrointestinal: negative for abdominal pain  Objective  Vitals: BP 122/72   Pulse 68   Temp 98.1 F (36.7 C) (Temporal)   Ht 5\' 3"  (1.6 m)   Wt 120 lb (54.4 kg)   SpO2 98%   BMI 21.26 kg/m  General: no acute distress, well appearing, no apparent distress, well groomed Psych:  Alert and oriented x 3,normal mood, behavior, speech, dress, and thought processes.  Appears much happier   Commons side effects, risks, benefits, and alternatives for medications and treatment plan prescribed today were discussed, and the patient expressed understanding of the given instructions. Patient is instructed to call or message via MyChart if he/she has any questions or concerns regarding our treatment plan. No barriers to understanding were identified. We discussed Red Flag symptoms and signs in detail. Patient expressed understanding regarding what to do in case of urgent or emergency type symptoms.  Medication list was reconciled, printed and provided to the patient in AVS. Patient instructions and summary information was reviewed with the patient as documented in the AVS. This note was prepared with assistance of Dragon voice recognition software. Occasional wrong-word or sound-a-like substitutions may have occurred due to the inherent limitations of voice recognition software

## 2021-04-22 NOTE — Patient Instructions (Signed)
Please return in 3 months for your annual complete physical; please come fasting. And f/u sleep/mood  If you have any questions or concerns, please don't hesitate to send me a message via MyChart or call the office at [phone]. Thank you for visiting with [place] today! It's our pleasure caring for you.

## 2021-05-20 ENCOUNTER — Other Ambulatory Visit: Payer: Self-pay | Admitting: Family Medicine

## 2021-05-20 DIAGNOSIS — Z1231 Encounter for screening mammogram for malignant neoplasm of breast: Secondary | ICD-10-CM

## 2021-05-28 ENCOUNTER — Encounter: Payer: Self-pay | Admitting: Family Medicine

## 2021-05-28 DIAGNOSIS — L570 Actinic keratosis: Secondary | ICD-10-CM | POA: Diagnosis not present

## 2021-05-28 DIAGNOSIS — B079 Viral wart, unspecified: Secondary | ICD-10-CM | POA: Diagnosis not present

## 2021-05-29 ENCOUNTER — Other Ambulatory Visit: Payer: Self-pay

## 2021-05-29 MED ORDER — SERTRALINE HCL 50 MG PO TABS: 50.0000 mg | ORAL_TABLET | Freq: Every day | ORAL | 1 refills | Status: DC

## 2021-07-29 ENCOUNTER — Ambulatory Visit
Admission: RE | Admit: 2021-07-29 | Discharge: 2021-07-29 | Disposition: A | Discharge status: Home / Self Care | Payer: Medicare Other | Source: Ambulatory Visit | Attending: Family Medicine | Admitting: Family Medicine

## 2021-07-29 DIAGNOSIS — Z1231 Encounter for screening mammogram for malignant neoplasm of breast: Secondary | ICD-10-CM | POA: Diagnosis not present

## 2021-08-16 DIAGNOSIS — Z20822 Contact with and (suspected) exposure to covid-19: Secondary | ICD-10-CM | POA: Diagnosis not present

## 2021-09-09 ENCOUNTER — Telehealth (INDEPENDENT_AMBULATORY_CARE_PROVIDER_SITE_OTHER): Payer: Medicare Other | Admitting: Family Medicine

## 2021-09-09 ENCOUNTER — Encounter: Payer: Self-pay | Admitting: Family Medicine

## 2021-09-09 VITALS — Ht 63.0 in | Wt 119.9 lb

## 2021-09-09 DIAGNOSIS — J069 Acute upper respiratory infection, unspecified: Secondary | ICD-10-CM | POA: Diagnosis not present

## 2021-09-09 MED ORDER — AZITHROMYCIN 250 MG PO TABS: ORAL_TABLET | ORAL | 0 refills | Status: DC

## 2021-09-09 NOTE — Progress Notes (Signed)
? ? ? ?Virtual Visit via Video Note ? ?Subjective  ?CC:  ?Chief Complaint  ?Patient presents with  ? Sore Throat  ?  Started Friday, She has taken Coricidin-D and Tylenol. Covid Tests have been negative.  ? Nasal Congestion  ? Fever  ?  Has been as high as 100.3  ? Fatigue  ? ? ? ?I connected with Evelyn Jordan on 09/09/21 at  4:15 PM EDT by a video enabled telemedicine application and verified that I am speaking with the correct person using two identifiers. ?Location patient: Home ?Location provider: [facility] [place], Office ?Persons participating in the virtual visit: [name], MD Tyna Jaksch CMA ? ?I discussed the limitations of evaluation and management by telemedicine and the availability of in person appointments. The patient expressed understanding and agreed to proceed. ?HPI: ELLOWYN Jordan is a 71 y.o. female who was contacted today to address the problems listed above in the chief complaint. ?71 year old healthy female who is traveling to [place] in 48 hours has URI symptoms consisting of mild cough, congestion, low-grade fevers and mild malaise.  Scratchy throat.  No sinus tenderness, ear pain, shortness of breath or significant cough.  No GI symptoms.  Negative COVID testing x3. ?Assessment  ?1. Upper respiratory tract infection, unspecified type   ? ?  ?Plan  ?URI: Discussed that symptoms are most consistent with viral URI.  Given upcoming international travel, Z-Pak prescribed just to cover bacterial infections if needed.  Patient understands use and expectations.  Supportive care. ?I discussed the assessment and treatment plan with the patient. The patient was provided an opportunity to ask questions and all were answered. The patient agreed with the plan and demonstrated an understanding of the instructions. ?  ?The patient was advised to call back or seek an in-person evaluation if the symptoms worsen or if the condition fails to improve as  anticipated. ?Follow up: As needed ?Visit date not found ? ?Meds ordered this encounter  ?Medications  ? azithromycin (ZITHROMAX) 250 MG tablet  ?  Sig: Take 2 tabs today, then 1 tab daily for 4 days  ?  Dispense:  1 each  ?  Refill:  0  ? ?  ? ?I reviewed the patients updated PMH, FH, and SocHx.  ?  ?Patient Active Problem List  ? Diagnosis Date Noted  ? Depression, major, single episode, moderate ([place]) 04/22/2021  ? Pulmonary nodules 04/14/2019  ? Thyroid nodule 04/14/2019  ? Synovial cyst of popliteal space 04/05/2018  ? Osteoporosis 12/23/2017  ? 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 ?Current Meds  ?Medication Sig  ? azithromycin (ZITHROMAX) 250 MG tablet Take 2 tabs today, then 1 tab daily for 4 days  ? sertraline (ZOLOFT) 50 MG tablet Take 1 tablet (50 mg total) by mouth daily.  ? ? ?Allergies: ?Patient is allergic to compazine [prochlorperazine edisylate]. ?Family History: ?Patient family history includes Alzheimer's disease in her mother; Hypertension in her father; Stroke in her father. ?Social History:  ?Patient  reports that she has quit smoking. She has never used smokeless tobacco. She reports current alcohol use. She reports that she does not use drugs. ? ?Review of Systems: ?Constitutional: Negative for fever malaise or anorexia ?Cardiovascular: negative for chest pain ?Respiratory: negative for SOB or persistent cough ?Gastrointestinal: negative for abdominal pain ? ?OBJECTIVE ?Vitals: Ht 5\' 3"  (1.6 m)   Wt 119 lb 14.9 oz (54.4 kg)   BMI 21.24 kg/m?  ?General: no acute  distress , A&Ox3, appears well, mild congestion ? ?Leamon Arnt, MD  ? ? ? ? ? ?

## 2021-09-10 ENCOUNTER — Encounter: Payer: Self-pay | Admitting: Family Medicine

## 2021-09-17 ENCOUNTER — Other Ambulatory Visit: Payer: Self-pay | Admitting: Family Medicine

## 2021-10-03 ENCOUNTER — Ambulatory Visit (INDEPENDENT_AMBULATORY_CARE_PROVIDER_SITE_OTHER): Payer: Medicare Other

## 2021-10-03 DIAGNOSIS — Z0000 Encounter for general adult medical examination without abnormal findings: Secondary | ICD-10-CM | POA: Diagnosis not present

## 2021-10-03 NOTE — Progress Notes (Signed)
Virtual Visit via Telephone Note  I connected with  Evelyn Jordan on 10/03/21 at 10:15 AM EDT by telephone and verified that I am speaking with the correct person using two identifiers.  Medicare Annual Wellness visit completed telephonically due to Covid-19 pandemic.   Persons participating in this call: This Health Coach and this patient.   Location: Patient: Home Provider: Office    I discussed the limitations, risks, security and privacy concerns of performing an evaluation and management service by telephone and the availability of in person appointments. The patient expressed understanding and agreed to proceed.  Unable to perform video visit due to video visit attempted and failed and/or patient does not have video capability.   Some vital signs may be absent or patient reported.   Willette Brace, LPN   Subjective:   Evelyn Jordan is a 71 y.o. female who presents for Medicare Annual (Subsequent) preventive examination.  Review of Systems     Cardiac Risk Factors include: advanced age (>61mn, >>65women)     Objective:    There were no vitals filed for this visit. There is no height or weight on file to calculate BMI.     10/03/2021   10:11 AM 04/05/2020    8:45 AM 03/07/2019   10:24 AM 12/02/2017   10:59 AM  Advanced Directives  Does Patient Have a Medical Advance Directive? Yes Yes Yes No  [place] of [place];Healthcare Power of Attorney   Does patient want to make changes to medical advance directive?   No - Patient declined   Copy of [place]? Yes - validated most recent copy scanned in chart (See row information) Yes - validated most recent copy scanned in chart (See row information) Yes - validated most recent copy scanned in chart (See row information)   Would patient like information on creating a medical advance directive?    Yes  (MAU/Ambulatory/Procedural Areas - Information given)    Current Medications (verified) Outpatient Encounter Medications as of 10/03/2021  Medication Sig   sertraline (ZOLOFT) 50 MG tablet TAKE 1 TABLET(50 MG) BY MOUTH DAILY   ALPRAZolam (XANAX) 0.5 MG tablet Take 1 tablet (0.5 mg total) by mouth daily as needed for anxiety. (Patient not taking: Reported on 09/09/2021)   zolpidem (AMBIEN) 5 MG tablet TAKE 1 TABLET(5 MG) BY MOUTH AT BEDTIME AS NEEDED FOR SLEEP (Patient not taking: Reported on 09/09/2021)   [DISCONTINUED] azithromycin (ZITHROMAX) 250 MG tablet Take 2 tabs today, then 1 tab daily for 4 days   No facility-administered encounter medications on file as of 10/03/2021.    Allergies (verified) Compazine [prochlorperazine edisylate]   History: History reviewed. No pertinent past medical history. Past Surgical History:  Procedure Laterality Date   BREAST BIOPSY Right    benign 2013   repair of a torn meniscus Left 05/2018   Family History  Problem Relation Age of Onset   Alzheimer's disease Mother    Hypertension Father    Stroke Father    Social History   Socioeconomic History   Marital status: Married    Spouse name: Not on file   Number of children: Not on file   Years of education: Not on file   Highest education level: Not on file  Occupational History    Comment: retired nMarine scientist  Tobacco Use   Smoking status: Former   Smokeless tobacco: Never   Tobacco comments:  quit in 2011  Vaping Use   Vaping Use: Never used  Substance and Sexual Activity   Alcohol use: Yes    Comment: Rarely   Drug use: No   Sexual activity: Not on file  Other Topics Concern   Not on file  Social History Narrative   Not on file   Social Determinants of Health   Financial Resource Strain: Low Risk    Difficulty of Paying Living Expenses: Not hard at all  Food Insecurity: No Food Insecurity   Worried About Charity fundraiser in the Last Year: Never true   [place] in  the Last Year: Never true  Transportation Needs: No Transportation Needs   Lack of Transportation (Medical): No   Lack of Transportation (Non-Medical): No  Physical Activity: Insufficiently Active   Days of Exercise per Week: 7 days   Minutes of Exercise per Session: 20 min  Stress: No Stress Concern Present   Feeling of Stress : Not at all  Social Connections: Moderately Isolated   Frequency of Communication with Friends and Family: More than three times a week   Frequency of Social Gatherings with Friends and Family: More than three times a week   Attends Religious Services: Never   Marine scientist or Organizations: No   Attends Archivist Meetings: Never   Marital Status: Married    Tobacco Counseling Counseling given: Not Answered Tobacco comments: quit in 2011   Clinical Intake:  Pre-visit preparation completed: Yes  Pain : No/denies pain     BMI - recorded: 21.25 Nutritional Status: BMI of 19-24  Normal Nutritional Risks: None Diabetes: No  How often do you need to have someone help you when you read instructions, pamphlets, or other written materials from your doctor or pharmacy?: 1 - Never  Diabetic?no  Interpreter Needed?: No  Information entered by :: Charlott Rakes, LPN   Activities of Daily Living    10/03/2021   10:12 AM  In your present state of health, do you have any difficulty performing the following activities:  Hearing? 0  Vision? 0  Difficulty concentrating or making decisions? 0  Walking or climbing stairs? 0  Dressing or bathing? 0  Doing errands, shopping? 0  Preparing Food and eating ? N  Using the Toilet? N  In the past six months, have you accidently leaked urine? Y  Comment at times with coughing and horseback riding  Do you have problems with loss of bowel control? N  Managing your Medications? N  Managing your Finances? N  Housekeeping or managing your Housekeeping? N    Patient Care Team: Leamon Arnt, MD as PCP - General (Family Medicine) Marlou Sa [place], MD as Consulting Physician (Orthopedic Surgery) Gwendalyn Ege, OD as Consulting Physician (Ophthalmology)  Indicate any recent Medical Services you may have received from other than [facility] providers in the past year (date may be approximate).     Assessment:   This is a routine wellness examination for [name].  Hearing/Vision screen Hearing Screening - Comments:: Pt denies any hearing issues  Vision Screening - Comments:: Pt follows up with Dr [place] for annual eye exams   Dietary issues and exercise activities discussed: Current Exercise Habits: Home exercise routine, Type of exercise: yoga, Time (Minutes): 15, Frequency (Times/Week): 7, Weekly Exercise (Minutes/Week): 105   Goals Addressed             This Visit's Progress    Patient Stated  None at this time        Depression Screen    10/03/2021   10:10 AM 04/22/2021    3:33 PM 02/18/2021    3:00 PM 06/14/2020    9:26 AM 04/05/2020    8:44 AM 03/15/2019    1:41 PM 03/07/2019   10:24 AM  PHQ 2/9 Scores  PHQ - 2 Score 0 0 4 0 0 0 0  PHQ- 9 Score  3 12   0     Fall Risk    10/03/2021   10:12 AM 06/14/2020    9:25 AM 04/05/2020    8:46 AM 04/14/2019    9:26 AM 03/07/2019   10:24 AM  Fall Risk   Falls in the past year? 0 0 0 0 0  Number falls in past yr: 0 0 0 0 0  Injury with Fall? 0 0 0 0 0  Risk for fall due to : Impaired vision      Follow up Falls prevention discussed  Falls prevention discussed  Education provided;Falls prevention discussed;Falls evaluation completed    FALL RISK PREVENTION PERTAINING TO THE HOME:  Any stairs in or around the home? Yes  If so, are there any without handrails? No  Home free of loose throw rugs in walkways, pet beds, electrical cords, etc? Yes  Adequate lighting in your home to reduce risk of falls? Yes   ASSISTIVE DEVICES UTILIZED TO PREVENT FALLS:  Life alert? No  Use of a cane, walker or w/c? No  Grab  bars in the bathroom? No  Shower chair or bench in shower? No  Elevated toilet seat or a handicapped toilet? No   TIMED UP AND GO:  Was the test performed? No .  Cognitive Function:        10/03/2021   10:13 AM 04/05/2020    8:48 AM 03/07/2019   10:25 AM  6CIT Screen  What Year? 0 points 0 points 0 points  What month? 0 points 0 points 0 points  What time? 0 points  0 points  Count back from 20 0 points 0 points 0 points  Months in reverse 0 points 0 points 0 points  Repeat phrase 0 points 0 points 0 points  Total Score 0 points  0 points    Immunizations Immunization History  Administered Date(s) Administered   Fluad Quad(high Dose 65+) 03/07/2019, 03/20/2020, 04/12/2021   Hepatitis A 09/03/2006, 06/15/2007   IPV 06/15/2007   Influenza,inj,Quad PF,6+ Mos 07/07/2018   Influenza-Unspecified 04/24/2017   MMR 09/03/2006   Meningococcal Conjugate 06/15/2007   PFIZER(Purple Top)SARS-COV-2 Vaccination 08/20/2019, 09/13/2019, 03/20/2020   Pneumococcal Conjugate-13 12/02/2017   Pneumococcal Polysaccharide-23 04/08/2013, 03/23/2019   Tdap 09/03/2006, 12/23/2017   Typhoid Inactivated 09/03/2006   Yellow Fever 09/03/2006   Zoster Recombinat (Shingrix) 05/17/2018, 10/19/2018   Zoster, Live 09/16/2013    TDAP status: Up to date  Flu Vaccine status: Up to date  Pneumococcal vaccine status: Up to date  Covid-19 vaccine status: Completed vaccines  Qualifies for Shingles Vaccine? Yes   Zostavax completed Yes   Shingrix Completed?: Yes  Screening Tests Health Maintenance  Topic Date Due   COVID-19 Vaccine (4 - Booster for Pfizer series) 05/15/2020   INFLUENZA VACCINE  01/07/2022   MAMMOGRAM  07/29/2022   DEXA SCAN  10/31/2022   COLONOSCOPY (Pts 45-3yr Insurance coverage will need to be confirmed)  08/13/2024   TETANUS/TDAP  12/24/2027   Pneumonia Vaccine 70 Years old  Completed   Hepatitis C Screening  Completed   Zoster Vaccines- Shingrix  Completed   HPV VACCINES   Aged Out    Health Maintenance  Health Maintenance Due  Topic Date Due   COVID-19 Vaccine (4 - Booster for Pfizer series) 05/15/2020    Colorectal cancer screening: Type of screening: Colonoscopy. Completed 08/14/14. Repeat every 10 years  Mammogram status: Completed 07/29/21. Repeat every year  Bone Density status: Completed 10/30/20. Results reflect: Bone density results: OSTEOPOROSIS. Repeat every 2 years.  Additional Screening:  Hepatitis C Screening:  Completed 06/14/20  Vision Screening: Recommended annual ophthalmology exams for early detection of glaucoma and other disorders of the eye. Is the patient up to date with their annual eye exam?  Yes  Who is the provider or what is the name of the office in which the patient attends annual eye exams? Dr [place] If pt is not established with a provider, would they like to be referred to a provider to establish care? No .   Dental Screening: Recommended annual dental exams for proper oral hygiene  Community Resource Referral / Chronic Care Management: CRR required this visit?  No   CCM required this visit?  No      Plan:     I have personally reviewed and noted the following in the patient's chart:   Medical and social history Use of alcohol, tobacco or illicit drugs  Current medications and supplements including opioid prescriptions.  Functional ability and status Nutritional status Physical activity Advanced directives List of other physicians Hospitalizations, surgeries, and ER visits in previous 12 months Vitals Screenings to include cognitive, depression, and falls Referrals and appointments  In addition, I have reviewed and discussed with patient certain preventive protocols, quality metrics, and best practice recommendations. A written personalized care plan for preventive services as well as general preventive health recommendations were provided to patient.     Willette Brace, LPN   7/85/8850   Nurse  Notes: None

## 2021-10-03 NOTE — Patient Instructions (Signed)
Ms. Ting , ?Thank you for taking time to come for your Medicare Wellness Visit. I appreciate your ongoing commitment to your health goals. Please review the following plan we discussed and let me know if I can assist you in the future.  ? ?Screening recommendations/referrals: ?Colonoscopy: Done 08/14/14 repeat every 10 years  ?Mammogram: Done 07/29/21 repeat every year  ?Bone Density: Done 10/30/20 repeat every 2 years  ?Recommended yearly ophthalmology/optometry visit for glaucoma screening and checkup ?Recommended yearly dental visit for hygiene and checkup ? ?Vaccinations: ?Influenza vaccine: Done 04/12/21 repeat every year  ?Pneumococcal vaccine: Up to date ?Tdap vaccine: Done 7//17/19 repeat every 10 years  ?Shingles vaccine: Completed 05/17/18, 10/19/18   ?Covid-19:Completed 3/13, 4/6, 03/20/20 ? ?Advanced directives: Copies in chart  ? ?Conditions/risks identified: None at this time ? ? ?Next appointment: Follow up in one year for your annual wellness visit  ? ? ?Preventive Care 37 Years and Older, Female ?Preventive care refers to lifestyle choices and visits with your health care provider that can promote health and wellness. ?What does preventive care include? ?A yearly physical exam. This is also called an annual well check. ?Dental exams once or twice a year. ?Routine eye exams. Ask your health care provider how often you should have your eyes checked. ?Personal lifestyle choices, including: ?Daily care of your teeth and gums. ?Regular physical activity. ?Eating a healthy diet. ?Avoiding tobacco and drug use. ?Limiting alcohol use. ?Practicing safe sex. ?Taking low-dose aspirin every day. ?Taking vitamin and mineral supplements as recommended by your health care provider. ?What happens during an annual well check? ?The services and screenings done by your health care provider during your annual well check will depend on your age, overall health, lifestyle risk factors, and family history of  disease. ?Counseling  ?Your health care provider may ask you questions about your: ?Alcohol use. ?Tobacco use. ?Drug use. ?Emotional well-being. ?Home and relationship well-being. ?Sexual activity. ?Eating habits. ?History of falls. ?Memory and ability to understand (cognition). ?Work and work Statistician. ?Reproductive health. ?Screening  ?You may have the following tests or measurements: ?Height, weight, and BMI. ?Blood pressure. ?Lipid and cholesterol levels. These may be checked every 5 years, or more frequently if you are over 88 years old. ?Skin check. ?Lung cancer screening. You may have this screening every year starting at age 85 if you have a 30-pack-year history of smoking and currently smoke or have quit within the past 15 years. ?Fecal occult blood test (FOBT) of the stool. You may have this test every year starting at age 53. ?Flexible sigmoidoscopy or colonoscopy. You may have a sigmoidoscopy every 5 years or a colonoscopy every 10 years starting at age 55. ?Hepatitis C blood test. ?Hepatitis B blood test. ?Sexually transmitted disease (STD) testing. ?Diabetes screening. This is done by checking your blood sugar (glucose) after you have not eaten for a while (fasting). You may have this done every 1-3 years. ?Bone density scan. This is done to screen for osteoporosis. You may have this done starting at age 40. ?Mammogram. This may be done every 1-2 years. Talk to your health care provider about how often you should have regular mammograms. ?Talk with your health care provider about your test results, treatment options, and if necessary, the need for more tests. ?Vaccines  ?Your health care provider may recommend certain vaccines, such as: ?Influenza vaccine. This is recommended every year. ?Tetanus, diphtheria, and acellular pertussis (Tdap, Td) vaccine. You may need a Td booster every 10 years. ?Zoster vaccine.  You may need this after age 69. ?Pneumococcal 13-valent conjugate (PCV13) vaccine. One  dose is recommended after age 78. ?Pneumococcal polysaccharide (PPSV23) vaccine. One dose is recommended after age 91. ?Talk to your health care provider about which screenings and vaccines you need and how often you need them. ?This information is not intended to replace advice given to you by your health care provider. Make sure you discuss any questions you have with your health care provider. ?Document Released: 06/22/2015 Document Revised: 02/13/2016 Document Reviewed: 03/27/2015 ?Elsevier Interactive Patient Education ? 2017 [place]. ? ?Fall Prevention in the Home ?Falls can cause injuries. They can happen to people of all ages. There are many things you can do to make your home safe and to help prevent falls. ?What can I do on the outside of my home? ?Regularly fix the edges of walkways and driveways and fix any cracks. ?Remove anything that might make you trip as you walk through a door, such as a raised step or threshold. ?Trim any bushes or trees on the path to your home. ?Use bright outdoor lighting. ?Clear any walking paths of anything that might make someone trip, such as rocks or tools. ?Regularly check to see if handrails are loose or broken. Make sure that both sides of any steps have handrails. ?Any raised decks and porches should have guardrails on the edges. ?Have any leaves, snow, or ice cleared regularly. ?Use sand or salt on walking paths during winter. ?Clean up any spills in your garage right away. This includes oil or grease spills. ?What can I do in the bathroom? ?Use night lights. ?Install grab bars by the toilet and in the tub and shower. Do not use towel bars as grab bars. ?Use non-skid mats or decals in the tub or shower. ?If you need to sit down in the shower, use a plastic, non-slip stool. ?Keep the floor dry. Clean up any water that spills on the floor as soon as it happens. ?Remove soap buildup in the tub or shower regularly. ?Attach bath mats securely with double-sided  non-slip rug tape. ?Do not have throw rugs and other things on the floor that can make you trip. ?What can I do in the bedroom? ?Use night lights. ?Make sure that you have a light by your bed that is easy to reach. ?Do not use any sheets or blankets that are too big for your bed. They should not hang down onto the floor. ?Have a firm chair that has side arms. You can use this for support while you get dressed. ?Do not have throw rugs and other things on the floor that can make you trip. ?What can I do in the kitchen? ?Clean up any spills right away. ?Avoid walking on wet floors. ?Keep items that you use a lot in easy-to-reach places. ?If you need to reach something above you, use a strong step stool that has a grab bar. ?Keep electrical cords out of the way. ?Do not use floor polish or wax that makes floors slippery. If you must use wax, use non-skid floor wax. ?Do not have throw rugs and other things on the floor that can make you trip. ?What can I do with my stairs? ?Do not leave any items on the stairs. ?Make sure that there are handrails on both sides of the stairs and use them. Fix handrails that are broken or loose. Make sure that handrails are as long as the stairways. ?Check any carpeting to make sure that it is  firmly attached to the stairs. Fix any carpet that is loose or worn. ?Avoid having throw rugs at the top or bottom of the stairs. If you do have throw rugs, attach them to the floor with carpet tape. ?Make sure that you have a light switch at the top of the stairs and the bottom of the stairs. If you do not have them, ask someone to add them for you. ?What else can I do to help prevent falls? ?Wear shoes that: ?Do not have high heels. ?Have rubber bottoms. ?Are comfortable and fit you well. ?Are closed at the toe. Do not wear sandals. ?If you use a stepladder: ?Make sure that it is fully opened. Do not climb a closed stepladder. ?Make sure that both sides of the stepladder are locked into place. ?Ask  someone to hold it for you, if possible. ?Clearly mark and make sure that you can see: ?Any grab bars or handrails. ?First and last steps. ?Where the edge of each step is. ?Use tools that help you move around (mobility aids) if

## 2021-10-11 ENCOUNTER — Ambulatory Visit (INDEPENDENT_AMBULATORY_CARE_PROVIDER_SITE_OTHER): Payer: Medicare Other | Admitting: Podiatry

## 2021-10-11 ENCOUNTER — Encounter: Payer: Self-pay | Admitting: Podiatry

## 2021-10-11 DIAGNOSIS — B351 Tinea unguium: Secondary | ICD-10-CM | POA: Diagnosis not present

## 2021-10-11 DIAGNOSIS — L600 Ingrowing nail: Secondary | ICD-10-CM

## 2021-10-11 NOTE — Progress Notes (Signed)
Subjective:  ? ?Patient ID: Evelyn Jordan, female   DOB: 71 y.o.   MRN: [id number]  ? ?HPI ?Patient presents complaining of several problems with long-term thick toenails and also started to develop discomfort in the fourth nails of both feet that she tries to keep down to the best of her ability.  States she has been on oral antifungals in the past along with topical and wants to know if anything else can be done and what some solutions to be.  Patient does not smoke likes to be active ? ? ?Review of Systems  ?All other systems reviewed and are negative. ? ? ?   ?Objective:  ?Physical Exam ?Vitals and nursing note reviewed.  ?Constitutional:   ?   Appearance: She is well-developed.  ?Pulmonary:  ?   Effort: Pulmonary effort is normal.  ?Musculoskeletal:     ?   General: Normal range of motion.  ?Skin: ?   General: Skin is warm.  ?Neurological:  ?   Mental Status: She is alert.  ?  ?Neurovascular status found to be intact muscle strength found to be adequate range of motion is within normal limits.  Patient is noted to have discoloration of the nailbeds bilateral with digital disease which is creating thickness of the beds and discomfort in the fourth nails of both feet secondary to the incurvation and digital disease.  Good digital perfusion well oriented x3 ? ?   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 ?   ?Plan:  ?H&P spent a great deal of time trying to educate her on this and discussing the traumatic causes for her problem versus the fungal causes and how that affects treatment.  I do not recommend further oral antifungal do not think topical or laser will be of benefit but I did discuss nail removal at 1 point in future and went over the pros of doing this if things were to start to get more tender.  She will contemplate this and at 1 point in future this may be necessary and  will be seen back to recheck ?   ? ? ?

## 2022-01-15 ENCOUNTER — Encounter (INDEPENDENT_AMBULATORY_CARE_PROVIDER_SITE_OTHER): Payer: Self-pay

## 2022-03-03 ENCOUNTER — Encounter: Payer: Self-pay | Admitting: *Deleted

## 2022-03-10 ENCOUNTER — Encounter: Payer: Self-pay | Admitting: Family Medicine

## 2022-03-17 ENCOUNTER — Ambulatory Visit: Payer: Medicare Other | Admitting: Family Medicine

## 2022-03-17 DIAGNOSIS — Z23 Encounter for immunization: Secondary | ICD-10-CM | POA: Diagnosis not present

## 2022-03-24 ENCOUNTER — Ambulatory Visit: Payer: Medicare Other | Admitting: Family Medicine

## 2022-03-24 ENCOUNTER — Encounter: Payer: Self-pay | Admitting: Family Medicine

## 2022-03-24 ENCOUNTER — Ambulatory Visit (INDEPENDENT_AMBULATORY_CARE_PROVIDER_SITE_OTHER): Payer: Medicare Other | Admitting: Family Medicine

## 2022-03-24 VITALS — BP 130/74 | HR 72 | Temp 98.6°F | Ht 63.0 in | Wt 121.6 lb

## 2022-03-24 DIAGNOSIS — M67471 Ganglion, right ankle and foot: Secondary | ICD-10-CM | POA: Diagnosis not present

## 2022-03-24 DIAGNOSIS — F321 Major depressive disorder, single episode, moderate: Secondary | ICD-10-CM | POA: Diagnosis not present

## 2022-03-24 DIAGNOSIS — J00 Acute nasopharyngitis [common cold]: Secondary | ICD-10-CM

## 2022-03-24 NOTE — Progress Notes (Signed)
Subjective  CC:  Chief Complaint  Patient presents with   Depression    Pt stated that she has been weeing herself off the medications and she is ok at this moment. Pt stated that she also has a spot Lt foot that has been there for the past 6/60mos    HPI: Evelyn Jordan is a 71 y.o. female who presents to the office today to address the problems listed above in the chief complaint, mood problems. Last here about a year ago for her depression.  Has been doing well on Zoloft.  However about a month ago she lost her prescription while traveling in [place].  Since, she has remained stable mood wise.  Home life has significantly improved.  No longer with significant stressors.  No symptoms of depression nor anxiety.  She was treated overall for about a year and a half.  This was her first episode of major depression in her lifetime. She has a small bump on her left foot.  Nontender, not growing.  Noticed about a month ago. URI symptoms: Started about a week ago.  Overall much better, no longer with sore throat or cough but continues to have a mild dry irritated throat.  Admits to some congestion and postnasal drainage.  Not taking any medications.  No fevers.  She is traveling to [place] in 3 days and wants to make sure everything is okay.     03/24/2022   10:38 AM 10/03/2021   10:10 AM 04/22/2021    3:33 PM  Depression screen PHQ 2/9  Decreased Interest 0 0   Down, Depressed, Hopeless 0 0 0  PHQ - 2 Score 0 0 0  Altered sleeping   3  Tired, decreased energy   0  Change in appetite   0  Feeling bad or failure about yourself    0  Trouble concentrating   0  Moving slowly or fidgety/restless   0  Suicidal thoughts   0  PHQ-9 Score   3  Difficult doing work/chores   Not difficult at all      04/22/2021    4:13 PM 02/18/2021    3:01 PM 11/29/2018   10:04 AM  GAD 7 : Generalized Anxiety Score  Nervous, Anxious, on Edge 0 2 1  Control/stop worrying 0 2 1  Worry too much - different  things 0 2 1  Trouble relaxing 0 2 1  Restless 0 2 1  Easily annoyed or irritable 0 2 1  Afraid - awful might happen 0 0 0  Total GAD 7 Score 0 12 6  Anxiety Difficulty  Very difficult Somewhat difficult     Assessment  1. Depression, major, single episode, moderate (HCC)   2. Ganglion cyst of right foot   3. Acute nasopharyngitis      Plan  Depression:    Follow up: No follow-ups on file.  No orders of the defined types were placed in this encounter.  No orders of the defined types were placed in this encounter.     I reviewed the patients updated PMH, FH, and SocHx.    Patient Active Problem List   Diagnosis Date Noted   Depression, major, single episode, moderate ([place]) 04/22/2021   Pulmonary nodules 04/14/2019   Thyroid nodule 04/14/2019   Synovial cyst of popliteal space 04/05/2018   Osteoporosis 12/23/2017   Primary insomnia 12/04/2017   Current Meds  Medication Sig   zolpidem (AMBIEN) 5 MG tablet TAKE 1 TABLET(5 MG)  BY MOUTH AT BEDTIME AS NEEDED FOR SLEEP   [DISCONTINUED] ALPRAZolam (XANAX) 0.5 MG tablet Take 1 tablet (0.5 mg total) by mouth daily as needed for anxiety.   [DISCONTINUED] sertraline (ZOLOFT) 50 MG tablet TAKE 1 TABLET(50 MG) BY MOUTH DAILY    Allergies: Patient is allergic to compazine [prochlorperazine edisylate]. Family history:  Patient family history includes Alzheimer's disease in her mother; Hypertension in her father; Stroke in her father. Social History   Socioeconomic History   Marital status: Married    Spouse name: Not on file   Number of children: Not on file   Years of education: Not on file   Highest education level: Not on file  Occupational History    Comment: retired Marine scientist   Tobacco Use   Smoking status: Former   Smokeless tobacco: Never   Tobacco comments:    quit in 2011  Vaping Use   Vaping Use: Never used  Substance and Sexual Activity   Alcohol use: Yes    Comment: Rarely   Drug use: No   Sexual activity:  Not on file  Other Topics Concern   Not on file  Social History Narrative   Not on file   Social Determinants of Health   Financial Resource Strain: Low Risk  (10/03/2021)   Overall Financial Resource Strain (CARDIA)    Difficulty of Paying Living Expenses: Not hard at all  Food Insecurity: No Food Insecurity (10/03/2021)   Hunger Vital Sign    Worried About Running Out of Food in the Last Year: Never true    [place] in the Last Year: Never true  Transportation Needs: No Transportation Needs (10/03/2021)   PRAPARE - Hydrologist (Medical): No    Lack of Transportation (Non-Medical): No  Physical Activity: Insufficiently Active (10/03/2021)   Exercise Vital Sign    Days of Exercise per Week: 7 days    Minutes of Exercise per Session: 20 min  Stress: No Stress Concern Present (10/03/2021)   [place]    Feeling of Stress : Not at all  Social Connections: Moderately Isolated (10/03/2021)   Social Connection and Isolation Panel [NHANES]    Frequency of Communication with Friends and Family: More than three times a week    Frequency of Social Gatherings with Friends and Family: More than three times a week    Attends Religious Services: Never    Marine scientist or Organizations: No    Attends Music therapist: Never    Marital Status: Married     Review of Systems: Constitutional: Negative for fever malaise or anorexia Cardiovascular: negative for chest pain Respiratory: negative for SOB or persistent cough Gastrointestinal: negative for abdominal pain  Objective  Vitals: BP 130/74   Pulse 72   Temp 98.6 F (37 C)   Ht 5\' 3"  (1.6 m)   Wt 121 lb 9.6 oz (55.2 kg)   SpO2 96%   BMI 21.54 kg/m  General: no acute distress, well appearing, no apparent distress, well groomed Psych:  Alert and oriented x 3,normal mood, behavior, speech, dress, and thought  processes. normal affect HEENT: TMs normal with air-fluid levels bilaterally, nasal congestion present, oropharynx clear.  No lymphadenopathy Cardiovascular:  RRR without murmur or gallop. no peripheral edema Respiratory:  Good breath sounds bilaterally, CTAB with normal respiratory effort Skin:  Warm, no rashes Left dorsal foot with approximately half centimeter mobile nontender cystic lesion  over the first extensor tendon   Commons side effects, risks, benefits, and alternatives for medications and treatment plan prescribed today were discussed, and the patient expressed understanding of the given instructions. Patient is instructed to call or message via MyChart if he/she has any questions or concerns regarding our treatment plan. No barriers to understanding were identified. We discussed Red Flag symptoms and signs in detail. Patient expressed understanding regarding what to do in case of urgent or emergency type symptoms.  Medication list was reconciled, printed and provided to the patient in AVS. Patient instructions and summary information was reviewed with the patient as documented in the AVS. This note was prepared with assistance of Dragon voice recognition software. Occasional wrong-word or sound-a-like substitutions may have occurred due to the inherent limitations of voice recognition software

## 2022-05-21 DIAGNOSIS — H25813 Combined forms of age-related cataract, bilateral: Secondary | ICD-10-CM | POA: Diagnosis not present

## 2022-05-21 DIAGNOSIS — H1789 Other corneal scars and opacities: Secondary | ICD-10-CM | POA: Diagnosis not present

## 2022-05-21 DIAGNOSIS — H35412 Lattice degeneration of retina, left eye: Secondary | ICD-10-CM | POA: Diagnosis not present

## 2022-05-21 DIAGNOSIS — H43811 Vitreous degeneration, right eye: Secondary | ICD-10-CM | POA: Diagnosis not present

## 2022-07-18 ENCOUNTER — Telehealth: Payer: Self-pay | Admitting: Family Medicine

## 2022-07-18 NOTE — Telephone Encounter (Signed)
Patient called to request an OV with PCP sometime next week. States that she had coughed up blood this morning and occasionally throughout day.   I immediately recommended patient to go to the UC/ED for further evaluation. Pt stated that she is currently in [place] and did not want to risk being hospitalized in another state. Patient promised that if symptoms worsened, she would go to nearest ED. Denies any other symptoms.

## 2022-07-21 ENCOUNTER — Other Ambulatory Visit: Payer: Self-pay | Admitting: Family Medicine

## 2022-07-21 DIAGNOSIS — Z1231 Encounter for screening mammogram for malignant neoplasm of breast: Secondary | ICD-10-CM

## 2022-07-22 ENCOUNTER — Ambulatory Visit (INDEPENDENT_AMBULATORY_CARE_PROVIDER_SITE_OTHER): Payer: Medicare Other | Admitting: Family Medicine

## 2022-07-22 ENCOUNTER — Encounter: Payer: Self-pay | Admitting: Family Medicine

## 2022-07-22 ENCOUNTER — Ambulatory Visit (INDEPENDENT_AMBULATORY_CARE_PROVIDER_SITE_OTHER)
Admission: RE | Admit: 2022-07-22 | Discharge: 2022-07-22 | Disposition: A | Discharge status: Home / Self Care | Payer: Medicare Other | Source: Ambulatory Visit | Attending: Family Medicine | Admitting: Family Medicine

## 2022-07-22 VITALS — BP 122/72 | HR 79 | Temp 98.3°F | Ht 63.0 in | Wt 123.6 lb

## 2022-07-22 DIAGNOSIS — R059 Cough, unspecified: Secondary | ICD-10-CM | POA: Diagnosis not present

## 2022-07-22 DIAGNOSIS — R042 Hemoptysis: Secondary | ICD-10-CM

## 2022-07-22 DIAGNOSIS — R069 Unspecified abnormalities of breathing: Secondary | ICD-10-CM

## 2022-07-22 DIAGNOSIS — Z87891 Personal history of nicotine dependence: Secondary | ICD-10-CM | POA: Diagnosis not present

## 2022-07-22 DIAGNOSIS — R911 Solitary pulmonary nodule: Secondary | ICD-10-CM | POA: Diagnosis not present

## 2022-07-22 LAB — COMPREHENSIVE METABOLIC PANEL
ALT: 9 U/L (ref 0–35)
AST: 13 U/L (ref 0–37)
Albumin: 3.5 g/dL (ref 3.5–5.2)
Alkaline Phosphatase: 68 U/L (ref 39–117)
BUN: 9 mg/dL (ref 6–23)
CO2: 29 mEq/L (ref 19–32)
Calcium: 9.1 mg/dL (ref 8.4–10.5)
Chloride: 104 mEq/L (ref 96–112)
Creatinine, Ser: 0.77 mg/dL (ref 0.40–1.20)
GFR: 77.86 mL/min (ref >60.00)
Glucose, Bld: 85 mg/dL (ref 70–99)
Potassium: 3.8 mEq/L (ref 3.5–5.1)
Sodium: 140 mEq/L (ref 135–145)
Total Bilirubin: 0.4 mg/dL (ref 0.2–1.2)
Total Protein: 6.1 g/dL (ref 6.0–8.3)

## 2022-07-22 LAB — CBC WITH DIFFERENTIAL/PLATELET
Basophils Absolute: 0 10*3/uL (ref 0.0–0.1)
Basophils Relative: 0.6 % (ref 0.0–3.0)
Eosinophils Absolute: 0.2 10*3/uL (ref 0.0–0.7)
Eosinophils Relative: 3.2 % (ref 0.0–5.0)
HCT: 37.2 % (ref 36.0–46.0)
Hemoglobin: 12.4 g/dL (ref 12.0–15.0)
Lymphocytes Relative: 10.3 % — ABNORMAL LOW (ref 12.0–46.0)
Lymphs Abs: 0.8 10*3/uL (ref 0.7–4.0)
MCHC: 33.4 g/dL (ref 30.0–36.0)
MCV: 88.9 fl (ref 78.0–100.0)
Monocytes Absolute: 0.8 10*3/uL (ref 0.1–1.0)
Monocytes Relative: 10.6 % (ref 3.0–12.0)
Neutro Abs: 5.5 10*3/uL (ref 1.4–7.7)
Neutrophils Relative %: 75.3 % (ref 43.0–77.0)
Platelets: 376 10*3/uL (ref 150.0–400.0)
RBC: 4.19 Mil/uL (ref 3.87–5.11)
RDW: 13.4 % (ref 11.5–15.5)
WBC: 7.4 10*3/uL (ref 4.0–10.5)

## 2022-07-22 LAB — SEDIMENTATION RATE: Sed Rate: 56 mm/hr — ABNORMAL HIGH (ref 0–30)

## 2022-07-22 NOTE — Progress Notes (Signed)
Subjective  CC:  Chief Complaint  Patient presents with   coughing up blood     Pt stated that she has been coughing up blood on 07/18/2022 for about 1hr. Now it has stopped but she has a dry cough    HPI: Evelyn Jordan is a 72 y.o. female who presents to the office today to address the problems listed above in the chief complaint. 72 year old healthy, active female former smoker, 40-pack-year quit in 2011 presents due to hemoptysis.  Had significant episode of hemoptysis on February 9.  She had been camping in [place] for several weeks.  Very active, felt well, no shortness of breath, fever, chills, URI symptoms or sinus symptoms.  Had the acute onset of cough.  Bright red blood multiple times over an hour.  Since, she has felt fine.  She drove home from [place] the weekend and is here for evaluation.  She does report that she feels that she cannot take a deep breath in, sort of a catching feeling in the top of her throat.  But denies dyspnea on exertion.  She was hiking and bike riding all last month without any problems.  She denies calf pain, leg pain or leg swelling.  No prior history of hemoptysis.  She has no sinus symptoms or postnasal drainage.  She denies GERD problems.  Appetite has been good.  No fevers or chills.  No unwanted weight loss. She has a history of benign pulmonary nodules.  I reviewed her last chest CT from 2021. Complete physical was January 2022.  Blood work was normal at that time. She is not taking any medications or blood thinners or over-the-counter supplements Review of systems is negative for palpitations, lightheadedness, chest pain, pleuritic chest pain, GI symptoms, appetite changes  Assessment  1. Hemoptysis   2. Abnormal breathing   3. Former smoker      Plan  Hemoptysis and former smoker: Recent for pulmonology etiology.  Check chest x-ray and blood work.  Differential includes pulmonary embolism, pulmonary neoplasm or other.  Education counseling  given.  Will likely need chest CT depending on results.  Patient is aware.  To emergency room for severe recurrence.  Follow up: As needed 10/16/2022  Orders Placed This Encounter  Procedures   DG Chest 2 View   CBC with Differential/Platelet   Sedimentation rate   D-dimer, quantitative   Comprehensive metabolic panel   No orders of the defined types were placed in this encounter.     I reviewed the patients updated PMH, FH, and SocHx.    Patient Active Problem List   Diagnosis Date Noted   Depression, major, single episode, moderate ([place]) 04/22/2021   Pulmonary nodules 04/14/2019   Thyroid nodule 04/14/2019   Synovial cyst of popliteal space 04/05/2018   Osteoporosis 12/23/2017   Primary insomnia 12/04/2017   No outpatient medications have been marked as taking for the 07/22/22 encounter (Office Visit) with Leamon Arnt, MD.    Allergies: Patient is allergic to compazine [prochlorperazine edisylate]. Family History: Patient family history includes Alzheimer's disease in her mother; Hypertension in her father; Stroke in her father. Social History:  Patient  reports that she has quit smoking. She has never used smokeless tobacco. She reports current alcohol use. She reports that she does not use drugs.  Review of Systems: Constitutional: Negative for fever malaise or anorexia Cardiovascular: negative for chest pain Respiratory: negative for SOB or persistent cough Gastrointestinal: negative for abdominal pain  Objective  Vitals:  BP 122/72   Pulse 79   Temp 98.3 F (36.8 C)   Ht 5\' 3"  (1.6 m)   Wt 123 lb 9.6 oz (56.1 kg)   SpO2 99%   BMI 21.89 kg/m  General: no acute distress , A&Ox3, peers well HEENT: PEERL, conjunctiva normal, neck is supple, no neck masses palpated, no lymphadenopathy Cardiovascular:  RRR without murmur or gallop.  Respiratory:  Good breath sounds bilaterally, CTAB with normal respiratory effort, no rales rhonchi or wheezing Skin:  Warm, no  rashes Extremities without calf tenderness or edema  Commons side effects, risks, benefits, and alternatives for medications and treatment plan prescribed today were discussed, and the patient expressed understanding of the given instructions. Patient is instructed to call or message via MyChart if he/she has any questions or concerns regarding our treatment plan. No barriers to understanding were identified. We discussed Red Flag symptoms and signs in detail. Patient expressed understanding regarding what to do in case of urgent or emergency type symptoms.  Medication list was reconciled, printed and provided to the patient in AVS. Patient instructions and summary information was reviewed with the patient as documented in the AVS. This note was prepared with assistance of Dragon voice recognition software. Occasional wrong-word or sound-a-like substitutions may have occurred due to the inherent limitations of voice recognition software

## 2022-07-22 NOTE — Addendum Note (Signed)
Addended by: Loura Back on: 07/22/2022 11:02 AM   Modules accepted: Orders

## 2022-07-22 NOTE — Patient Instructions (Signed)
Please go to our [facility] office to get your xrays done. You can walk in M-F between 8:30am- noon or 1pm - 5pm. Tell them you are there for xrays ordered by me. They will send me the results, then I will let you know the results with instructions.   Address: [address].  The Xray department is located in the basement.   I will release your lab results to you on your MyChart account with further instructions. You may see the results before I do, but when I review them I will send you a message with my report or have my assistant call you if things need to be discussed. Please reply to my message with any questions. Thank you!   If you have any questions or concerns, please don't hesitate to send me a message via MyChart or call the office at [phone]. Thank you for visiting with [place] today! It's our pleasure caring for you.   Hemoptysis Hemoptysis is coughing up blood. With mild hemoptysis, you may cough up small amounts of blood-streaked saliva and mucus from your lungs (sputum). With severe hemoptysis, you may cough up a lot of blood. Coughing up 1-2 cups (240-480 mL) of blood within 24 hours is a medical emergency. Common causes of mild (nonmassive) hemoptysis include: An infection in your nose, throat, or lungs, such as bronchitis, pneumonia, bronchiectasis, asthma, or chronic obstructive pulmonary disease (COPD). Nosebleeds. Breathing in a foreign object. Common causes of severe (massive) hemoptysis include: Tuberculosis (TB). A tumor in the lungs or upper airway. A blood clot in the lungs (pulmonary embolism). Stomach bleeding or ulcer. Taking blood thinner (anticoagulant) medicine. Having a medical condition that keeps your blood from normal clotting. Sometimes, the cause is not known. Follow these instructions at home: Medicines If you were prescribed an antibiotic medicine, take it as told by your health care provider. Do not stop using the antibiotic even if  you start to feel better. Take over-the-counter and prescription medicines only as told by your health care provider. General instructions  Do not use any products that contain nicotine or tobacco. These products include cigarettes, chewing tobacco, and vaping devices, such as e-cigarettes. If you need help quitting, ask your health care provider. Return to your normal activities as told by your health care provider. Ask your health care provider what activities are safe for you. This includes any exercise or air travel. Keep all follow-up visits. This is important. Contact a health care provider if: You have a fever over 100.15F (38C). The amount of bleeding increases. The blood looks brighter or darker red. Get help right away if you: Cough up fresh blood or blood clots. Cough up 1-2 cups (240-480 mL) in 24 hours. Have trouble breathing. Feel like you are choking. Have chest pain, light-headedness, or dizziness. These symptoms may be an emergency. Get help right away. Call 911. Do not wait to see if the symptoms will go away. Do not drive yourself to the hospital. Summary Hemoptysis is coughing up blood. Coughing up 1-2 cups (240-480 mL) of blood within 24 hours is a medical emergency. Do not use any products that contain nicotine or tobacco. These products include cigarettes, chewing tobacco, and vaping devices, such as e-cigarettes. If you need help quitting, ask your health care provider. This information is not intended to replace advice given to you by your health care provider. Make sure you discuss any questions you have with your health care provider. Document Revised: 12/31/2020  Document Reviewed: 12/31/2020 Elsevier Patient Education  [place].

## 2022-07-23 ENCOUNTER — Telehealth: Payer: Self-pay | Admitting: Family Medicine

## 2022-07-23 ENCOUNTER — Encounter: Payer: Self-pay | Admitting: Family Medicine

## 2022-07-23 LAB — D-DIMER, QUANTITATIVE: D-Dimer, Quant: 0.93 mcg/mL FEU — ABNORMAL HIGH (ref <0.50)

## 2022-07-23 NOTE — Telephone Encounter (Signed)
Patient requests to be called to be given chest Xray results

## 2022-07-24 ENCOUNTER — Telehealth: Payer: Self-pay

## 2022-07-24 ENCOUNTER — Ambulatory Visit (HOSPITAL_BASED_OUTPATIENT_CLINIC_OR_DEPARTMENT_OTHER)
Admission: RE | Admit: 2022-07-24 | Discharge: 2022-07-24 | Disposition: A | Discharge status: Home / Self Care | Payer: Medicare Other | Source: Ambulatory Visit | Attending: Family Medicine | Admitting: Family Medicine

## 2022-07-24 DIAGNOSIS — R911 Solitary pulmonary nodule: Secondary | ICD-10-CM | POA: Insufficient documentation

## 2022-07-24 DIAGNOSIS — R918 Other nonspecific abnormal finding of lung field: Secondary | ICD-10-CM | POA: Diagnosis not present

## 2022-07-24 MED ORDER — IOHEXOL 300 MG/ML  SOLN
100.0000 mL | Freq: Once | INTRAMUSCULAR | Status: AC | PRN
  Administered 2022-07-24: 100 mL via INTRAVENOUS

## 2022-07-24 NOTE — Telephone Encounter (Signed)
Dr. Jonni Sanger has LVM for pt to return call to the office regarding CT results.

## 2022-07-24 NOTE — Telephone Encounter (Signed)
Patient is calling back for an update on results. States she is growing concerned and would like some reassurance.

## 2022-07-24 NOTE — Addendum Note (Signed)
Addended by: Billey Chang on: 07/24/2022 02:09 PM   Modules accepted: Orders

## 2022-07-24 NOTE — Telephone Encounter (Signed)
Spoke with Rubi from Radiology and she stated that the pt has a mass on her lungs. Dr. Jonni Sanger was standing beside me on speaker when the information was relayed to [place].

## 2022-07-24 NOTE — Telephone Encounter (Signed)
Please call to get the results of the chest xray on this patient. It need to be read, was done 2 days ago. Thanks.

## 2022-07-24 NOTE — Progress Notes (Signed)
I left message on her voicemail regarding these results. CT ordered.

## 2022-07-25 ENCOUNTER — Other Ambulatory Visit: Payer: Self-pay

## 2022-07-25 ENCOUNTER — Telehealth: Payer: Self-pay | Admitting: Family Medicine

## 2022-07-25 ENCOUNTER — Encounter: Payer: Self-pay | Admitting: Family Medicine

## 2022-07-25 DIAGNOSIS — R042 Hemoptysis: Secondary | ICD-10-CM

## 2022-07-25 DIAGNOSIS — Z9889 Other specified postprocedural states: Secondary | ICD-10-CM

## 2022-07-25 DIAGNOSIS — R918 Other nonspecific abnormal finding of lung field: Secondary | ICD-10-CM

## 2022-07-25 DIAGNOSIS — C3490 Malignant neoplasm of unspecified part of unspecified bronchus or lung: Secondary | ICD-10-CM

## 2022-07-25 HISTORY — DX: Malignant neoplasm of unspecified part of unspecified bronchus or lung: C34.90

## 2022-07-25 NOTE — Telephone Encounter (Signed)
I have spoke with pt and STAT referral to Pulmology has been place

## 2022-07-25 NOTE — Progress Notes (Signed)
Please call patient: chest CT results show that there is a lesion inside her left bronchus; we need to place a stat referral to pulmonology for bronchoscopy for further evaluation/biopsy. This will allow [place] to identify what it is. Please place the referral as well and notify Lattie Haw. Thank you

## 2022-07-25 NOTE — Telephone Encounter (Signed)
Referral has been placed. 

## 2022-07-25 NOTE — Telephone Encounter (Signed)
Patient states Results from CT Scan showed a lump lesion and requests an Order/Referral for a Bronchoscopy.  Patient requests to be advised.

## 2022-07-29 ENCOUNTER — Ambulatory Visit (INDEPENDENT_AMBULATORY_CARE_PROVIDER_SITE_OTHER): Payer: Medicare Other | Admitting: Emergency Medicine

## 2022-07-29 ENCOUNTER — Encounter: Payer: Self-pay | Admitting: Emergency Medicine

## 2022-07-29 VITALS — BP 120/76 | HR 92 | Temp 97.9°F | Ht 63.0 in | Wt 121.6 lb

## 2022-07-29 DIAGNOSIS — Z01818 Encounter for other preprocedural examination: Secondary | ICD-10-CM | POA: Diagnosis not present

## 2022-07-29 DIAGNOSIS — R9389 Abnormal findings on diagnostic imaging of other specified body structures: Secondary | ICD-10-CM | POA: Insufficient documentation

## 2022-07-29 NOTE — Patient Instructions (Signed)
We reviewed your CT scan of the chest today. We will work on arranging bronchoscopy.  This will be done under general anesthesia as an outpatient at [facility] endoscopy.  Will try to get this scheduled for 08/04/2022.  You will need a designated driver and someone to watch you at home after the procedure. Follow Dr. Lamonte Sakai in 1 month or next available

## 2022-07-29 NOTE — Assessment & Plan Note (Signed)
Focal narrowing of the lingular bronchus with some distal consolidation and collapse.  This coupled with hemoptysis is concerning for malignancy.  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  I have recommended bronchoscopy with airway inspection and biopsies.  Will arrange to do this with fluoroscopy available in case we need to perform lingular transbronchial biopsies.  Will plan to do this under general anesthesia at [facility], hopefully on 08/04/2022.

## 2022-07-29 NOTE — H&P (View-Only) (Signed)
Subjective:    Patient ID: Evelyn Jordan, female    DOB: 1951-06-05, 72 y.o.   MRN: [id number]  HPI 72 year old former smoker (40 pack years) with little past medical history although she does have a history of pulmonary nodules.  She is referred today for hemoptysis and abnormal chest imaging. She is usually quite active.  She reports that she had been camping in [place] for several weeks and then had the acute onset of cough and bright red blood multiple times over an hour's time. She does not usually cough, has never seen blood before. No fevers, chills, sputum. She has kept d dry cough since, no more blood. No pain. Does now feel that she has a difficult time getting a full breath. No wt loss.   Chest x-ray 07/22/2022 reviewed by me shows a left perihilar opacification with some associated lingular volume loss.  CT scan of the chest 07/24/2022 reviewed by me shows no mediastinal or hilar adenopathy, focal narrowing of the left lingular bronchus with air bronchograms and consolidation and collapse of the lingula new compared with a prior CT 04/05/2020.  Scattered calcified granulomas are present   Review of Systems As per HPI  No past medical history on file.   Family History  Problem Relation Age of Onset   Alzheimer's disease Mother    Hypertension Father    Stroke Father      Social History   Socioeconomic History   Marital status: Married    Spouse name: Not on file   Number of children: Not on file   Years of education: Not on file   Highest education level: Not on file  Occupational History    Comment: retired Marine scientist   Tobacco Use   Smoking status: Former    Packs/day: 1.00    Years: 40.00    Total pack years: 40.00    Types: Cigarettes    Start date: 79    Quit date: 2012    Years since quitting: 12.1   Smokeless tobacco: Never  Vaping Use   Vaping Use: Never used  Substance and Sexual Activity   Alcohol use: Yes    Comment: Rarely   Drug use: No    Sexual activity: Not on file  Other Topics Concern   Not on file  Social History Narrative   Not on file   Social Determinants of Health   Financial Resource Strain: Low Risk  (10/03/2021)   Overall Financial Resource Strain (CARDIA)    Difficulty of Paying Living Expenses: Not hard at all  Food Insecurity: No Food Insecurity (10/03/2021)   Hunger Vital Sign    Worried About Running Out of Food in the Last Year: Never true    Webb in the Last Year: Never true  Transportation Needs: No Transportation Needs (10/03/2021)   PRAPARE - Hydrologist (Medical): No    Lack of Transportation (Non-Medical): No  Physical Activity: Insufficiently Active (10/03/2021)   Exercise Vital Sign    Days of Exercise per Week: 7 days    Minutes of Exercise per Session: 20 min  Stress: No Stress Concern Present (10/03/2021)   [place]    Feeling of Stress : Not at all  Social Connections: Moderately Isolated (10/03/2021)   Social Connection and Isolation Panel [NHANES]    Frequency of Communication with Friends and Family: More than three times a week    Frequency  of Social Gatherings with Friends and Family: More than three times a week    Attends Religious Services: Never    Marine scientist or Organizations: No    Attends Archivist Meetings: Never    Marital Status: Married  Human resources officer Violence: Not At Risk (10/03/2021)   Humiliation, Afraid, Rape, and Kick questionnaire    Fear of Current or Ex-Partner: No    Emotionally Abused: No    Physically Abused: No    Sexually Abused: No    From [place], has done school in [place]. Loves to camp No known TB testing, no hx known exposure.    Allergies  Allergen Reactions   Compazine [Prochlorperazine Edisylate]     Neck hyperextends     No outpatient medications prior to visit.   No facility-administered medications prior to visit.         Objective:   Physical Exam  Vitals:   07/29/22 1034  BP: 120/76  Pulse: 92  Temp: 97.9 F (36.6 C)  TempSrc: Oral  SpO2: 99%  Weight: 121 lb 9.6 oz (55.2 kg)  Height: 5\' 3"  (1.6 m)   Gen: Pleasant, well-nourished, in no distress,  normal affect  ENT: No lesions,  mouth clear,  oropharynx clear, no postnasal drip  Neck: No JVD, no stridor  Lungs: No use of accessory muscles, no crackles or wheezing on normal respiration, no wheeze on forced expiration  Cardiovascular: RRR, heart sounds normal, no murmur or gallops, no peripheral edema  Musculoskeletal: No deformities, no cyanosis or clubbing  Neuro: alert, awake, non focal  Skin: Warm, no lesions or rash    Assessment & Plan:   Abnormal CT of the chest Focal narrowing of the lingular bronchus with some distal consolidation and collapse.  This coupled with hemoptysis is concerning for malignancy.  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  I have recommended bronchoscopy with airway inspection and biopsies.  Will arrange to do this with fluoroscopy available in case we need to perform lingular transbronchial biopsies.  Will plan to do this under general anesthesia at [facility], hopefully on 08/04/2022.   Baltazar Apo, MD, PhD 07/29/2022, 4:29 PM [facility] [phone] or if no answer before 7:00PM call [phone] For any issues after 7:00PM please call eLink [phone]

## 2022-07-29 NOTE — Progress Notes (Signed)
Subjective:    Patient ID: Evelyn Jordan, female    DOB: 1951-06-05, 72 y.o.   MRN: [id number]  HPI 72 year old former smoker (40 pack years) with little past medical history although she does have a history of pulmonary nodules.  She is referred today for hemoptysis and abnormal chest imaging. She is usually quite active.  She reports that she had been camping in [place] for several weeks and then had the acute onset of cough and bright red blood multiple times over an hour's time. She does not usually cough, has never seen blood before. No fevers, chills, sputum. She has kept d dry cough since, no more blood. No pain. Does now feel that she has a difficult time getting a full breath. No wt loss.   Chest x-ray 07/22/2022 reviewed by me shows a left perihilar opacification with some associated lingular volume loss.  CT scan of the chest 07/24/2022 reviewed by me shows no mediastinal or hilar adenopathy, focal narrowing of the left lingular bronchus with air bronchograms and consolidation and collapse of the lingula new compared with a prior CT 04/05/2020.  Scattered calcified granulomas are present   Review of Systems As per HPI  No past medical history on file.   Family History  Problem Relation Age of Onset   Alzheimer's disease Mother    Hypertension Father    Stroke Father      Social History   Socioeconomic History   Marital status: Married    Spouse name: Not on file   Number of children: Not on file   Years of education: Not on file   Highest education level: Not on file  Occupational History    Comment: retired Marine scientist   Tobacco Use   Smoking status: Former    Packs/day: 1.00    Years: 40.00    Total pack years: 40.00    Types: Cigarettes    Start date: 79    Quit date: 2012    Years since quitting: 12.1   Smokeless tobacco: Never  Vaping Use   Vaping Use: Never used  Substance and Sexual Activity   Alcohol use: Yes    Comment: Rarely   Drug use: No    Sexual activity: Not on file  Other Topics Concern   Not on file  Social History Narrative   Not on file   Social Determinants of Health   Financial Resource Strain: Low Risk  (10/03/2021)   Overall Financial Resource Strain (CARDIA)    Difficulty of Paying Living Expenses: Not hard at all  Food Insecurity: No Food Insecurity (10/03/2021)   Hunger Vital Sign    Worried About Running Out of Food in the Last Year: Never true    [place]bb in the Last Year: Never true  Transportation Needs: No Transportation Needs (10/03/2021)   PRAPARE - Hydrologist (Medical): No    Lack of Transportation (Non-Medical): No  Physical Activity: Insufficiently Active (10/03/2021)   Exercise Vital Sign    Days of Exercise per Week: 7 days    Minutes of Exercise per Session: 20 min  Stress: No Stress Concern Present (10/03/2021)   [place]    Feeling of Stress : Not at all  Social Connections: Moderately Isolated (10/03/2021)   Social Connection and Isolation Panel [NHANES]    Frequency of Communication with Friends and Family: More than three times a week    Frequency  of Social Gatherings with Friends and Family: More than three times a week    Attends Religious Services: Never    Marine scientist or Organizations: No    Attends Archivist Meetings: Never    Marital Status: Married  Human resources officer Violence: Not At Risk (10/03/2021)   Humiliation, Afraid, Rape, and Kick questionnaire    Fear of Current or Ex-Partner: No    Emotionally Abused: No    Physically Abused: No    Sexually Abused: No    From [place], has done school in [place]. Loves to camp No known TB testing, no hx known exposure.    Allergies  Allergen Reactions   Compazine [Prochlorperazine Edisylate]     Neck hyperextends     No outpatient medications prior to visit.   No facility-administered medications prior to visit.         Objective:   Physical Exam  Vitals:   07/29/22 1034  BP: 120/76  Pulse: 92  Temp: 97.9 F (36.6 C)  TempSrc: Oral  SpO2: 99%  Weight: 121 lb 9.6 oz (55.2 kg)  Height: 5\' 3"  (1.6 m)   Gen: Pleasant, well-nourished, in no distress,  normal affect  ENT: No lesions,  mouth clear,  oropharynx clear, no postnasal drip  Neck: No JVD, no stridor  Lungs: No use of accessory muscles, no crackles or wheezing on normal respiration, no wheeze on forced expiration  Cardiovascular: RRR, heart sounds normal, no murmur or gallops, no peripheral edema  Musculoskeletal: No deformities, no cyanosis or clubbing  Neuro: alert, awake, non focal  Skin: Warm, no lesions or rash    Assessment & Plan:   Abnormal CT of the chest Focal narrowing of the lingular bronchus with some distal consolidation and collapse.  This coupled with hemoptysis is concerning for malignancy.  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  I have recommended bronchoscopy with airway inspection and biopsies.  Will arrange to do this with fluoroscopy available in case we need to perform lingular transbronchial biopsies.  Will plan to do this under general anesthesia at [facility], hopefully on 08/04/2022.   Baltazar Apo, MD, PhD 07/29/2022, 4:29 PM [facility] [phone] or if no answer before 7:00PM call [phone] For any issues after 7:00PM please call eLink [phone]

## 2022-07-31 ENCOUNTER — Other Ambulatory Visit: Payer: Self-pay

## 2022-07-31 ENCOUNTER — Ambulatory Visit
Admission: RE | Admit: 2022-07-31 | Discharge: 2022-07-31 | Disposition: A | Discharge status: Home / Self Care | Payer: Medicare Other | Source: Ambulatory Visit | Attending: Family Medicine | Admitting: Family Medicine

## 2022-07-31 ENCOUNTER — Encounter (HOSPITAL_COMMUNITY): Payer: Self-pay | Admitting: Emergency Medicine

## 2022-07-31 ENCOUNTER — Other Ambulatory Visit: Payer: Medicare Other

## 2022-07-31 DIAGNOSIS — Z01818 Encounter for other preprocedural examination: Secondary | ICD-10-CM

## 2022-07-31 DIAGNOSIS — Z1231 Encounter for screening mammogram for malignant neoplasm of breast: Secondary | ICD-10-CM | POA: Diagnosis not present

## 2022-08-01 ENCOUNTER — Institutional Professional Consult (permissible substitution): Payer: Medicare Other | Admitting: Pulmonary Disease

## 2022-08-01 ENCOUNTER — Encounter (HOSPITAL_COMMUNITY): Payer: Self-pay | Admitting: Emergency Medicine

## 2022-08-01 ENCOUNTER — Other Ambulatory Visit: Payer: Self-pay

## 2022-08-01 NOTE — Progress Notes (Signed)
PCP - Dr. Dow Adolph Cardiologist - Denies  PPM/ICD - Denies  Chest x-ray - 07/22/2022 EKG - Denies Stress Test - Denies ECHO - 04/23/2017 Cardiac Cath - Denies  Sleep study/sleep apnea/CPAP - Denies  Non-Diabetic   Blood Thinner Instructions: Denies Aspirin Instructions: Denies  ERAS Protcol - No, NPO  COVID TEST- [facility] 07/31/2022. Awaiting results   Anesthesia review: No  Patient verbally denies any shortness of breath, fever, cough and chest pain during phone call   -------------  SDW INSTRUCTIONS given:  Your procedure is scheduled on Monday, August 04, 2022.  Report to [facility] Main Entrance "A" at 8:30 A.M., and check in at the Admitting office.  Call this number if you have problems the morning of surgery:  [phone]   Remember:  Do not eat or drink after midnight the night before your surgery   Take these medicines the morning of surgery with A SIP OF WATER: None   As of today, STOP taking any Aspirin (unless otherwise instructed by your surgeon) Aleve, Naproxen, Ibuprofen, Motrin, Advil, Goody's, BC's, all herbal medications, fish oil, and all vitamins.                      Do not wear jewelry, make up, or nail polish            Do not wear lotions, powders, perfumes or deodorant.            Do not shave 48 hours prior to surgery.              Do not bring valuables to the hospital.            [facility] is not responsible for any belongings or valuables.  Do NOT Smoke (Tobacco/Vaping) 24 hours prior to your procedure If you use a CPAP at night, you may bring all equipment for your overnight stay.   Contacts, glasses, dentures or bridgework may not be worn into surgery.      For patients admitted to the hospital, discharge time will be determined by your treatment team.   Patients discharged the day of surgery will not be allowed to drive home, and someone needs to stay with them for 24 hours.    Special instructions:   [facility]- Preparing For Surgery  Before surgery, you can play an important role. Because skin is not sterile, your skin needs to be as free of germs as possible. You can reduce the number of germs on your skin by washing with Dial Soap before surgery.    Oral Hygiene is also important to reduce your risk of infection.  Remember - BRUSH YOUR TEETH THE MORNING OF SURGERY WITH YOUR REGULAR TOOTHPASTE   Please follow these instructions carefully.   Shower the NIGHT BEFORE SURGERY and the MORNING OF SURGERY with DIAL Soap.   Pat yourself dry with a CLEAN TOWEL.  Wear CLEAN PAJAMAS to bed the night before surgery  Place CLEAN SHEETS on your bed the night of your first shower and DO NOT SLEEP WITH PETS.   Day of Surgery: Please shower morning of surgery  Wear Clean/Comfortable clothing the morning of surgery Do not apply any deodorants/lotions.   Remember to brush your teeth WITH YOUR REGULAR TOOTHPASTE.   Questions were answered. Patient verbalized understanding of instructions.

## 2022-08-02 LAB — SPECIMEN STATUS REPORT

## 2022-08-02 LAB — NOVEL CORONAVIRUS, NAA: SARS-CoV-2, NAA: NOT DETECTED

## 2022-08-03 NOTE — Anesthesia Preprocedure Evaluation (Signed)
Anesthesia Evaluation  Patient identified by MRN, date of birth, ID band Patient awake    Reviewed: Allergy & Precautions, NPO status , Patient's Chart, lab work & pertinent test results  History of Anesthesia Complications Negative for: history of anesthetic complications  Airway Mallampati: III  TM Distance: >3 FB Neck ROM: Full    Dental  (+) Dental Advisory Given Permanent bridge top and bottom front:   Pulmonary shortness of breath, neg sleep apnea, neg COPD, neg recent URI, former smoker   Pulmonary exam normal breath sounds clear to auscultation       Cardiovascular (-) hypertension(-) angina (-) Past MI, (-) Cardiac Stents and (-) CABG negative cardio ROS (-) dysrhythmias  Rhythm:Regular Rate:Normal  TTE 04/23/2017: Study Conclusions   - Left ventricle: The cavity size was normal. Wall thickness was    normal. Systolic function was normal. The estimated ejection    fraction was in the range of 60% to 65%. Wall motion was normal;    there were no regional wall motion abnormalities. Doppler    parameters are consistent with abnormal left ventricular    relaxation (grade 1 diastolic dysfunction).  - Aortic valve: Trileaflet; mildly thickened, mildly calcified    leaflets.  - Mitral valve: There was trivial regurgitation.  - Right atrium: The atrium was mildly dilated.     Neuro/Psych  PSYCHIATRIC DISORDERS  Depression    negative neurological ROS     GI/Hepatic negative GI ROS, Neg liver ROS,,,  Endo/Other  neg diabetes  Thyroid nodule  Renal/GU negative Renal ROS     Musculoskeletal osteoporosis   Abdominal   Peds  Hematology negative hematology ROS (+)   Anesthesia Other Findings   Reproductive/Obstetrics                             Anesthesia Physical Anesthesia Plan  ASA: 3  Anesthesia Plan: General   Post-op Pain Management:    Induction: Intravenous  PONV Risk  Score and Plan: 3 and Ondansetron and Treatment may vary due to age or medical condition  Airway Management Planned: Oral ETT  Additional Equipment:   Intra-op Plan:   Post-operative Plan: Extubation in OR  Informed Consent: I have reviewed the patients History and Physical, chart, labs and discussed the procedure including the risks, benefits and alternatives for the proposed anesthesia with the patient or authorized representative who has indicated his/her understanding and acceptance.     Dental advisory given  Plan Discussed with: CRNA and Anesthesiologist  Anesthesia Plan Comments: (Risks of general anesthesia discussed including, but not limited to, sore throat, hoarse voice, chipped/damaged teeth, injury to vocal cords, nausea and vomiting, allergic reactions, lung infection, heart attack, stroke, and death. All questions answered. )        Anesthesia Quick Evaluation

## 2022-08-04 ENCOUNTER — Ambulatory Visit (HOSPITAL_BASED_OUTPATIENT_CLINIC_OR_DEPARTMENT_OTHER): Payer: Medicare Other | Admitting: Certified Registered Nurse Anesthetist

## 2022-08-04 ENCOUNTER — Other Ambulatory Visit: Payer: Self-pay

## 2022-08-04 ENCOUNTER — Encounter (HOSPITAL_COMMUNITY)
Admission: RE | Disposition: A | Discharge status: Home / Self Care | Payer: Self-pay | Source: Ambulatory Visit | Attending: Emergency Medicine

## 2022-08-04 ENCOUNTER — Other Ambulatory Visit: Payer: Self-pay | Admitting: Emergency Medicine

## 2022-08-04 ENCOUNTER — Ambulatory Visit (HOSPITAL_COMMUNITY): Payer: Medicare Other | Admitting: Certified Registered Nurse Anesthetist

## 2022-08-04 ENCOUNTER — Ambulatory Visit (HOSPITAL_COMMUNITY)
Admission: RE | Admit: 2022-08-04 | Discharge: 2022-08-04 | Disposition: A | Discharge status: Home / Self Care | Payer: Medicare Other | Source: Ambulatory Visit | Attending: Emergency Medicine | Admitting: Emergency Medicine

## 2022-08-04 ENCOUNTER — Encounter (HOSPITAL_COMMUNITY): Payer: Self-pay | Admitting: Emergency Medicine

## 2022-08-04 ENCOUNTER — Encounter: Payer: Self-pay | Admitting: Emergency Medicine

## 2022-08-04 ENCOUNTER — Ambulatory Visit (HOSPITAL_COMMUNITY): Payer: Medicare Other

## 2022-08-04 DIAGNOSIS — J449 Chronic obstructive pulmonary disease, unspecified: Secondary | ICD-10-CM | POA: Diagnosis not present

## 2022-08-04 DIAGNOSIS — R9389 Abnormal findings on diagnostic imaging of other specified body structures: Secondary | ICD-10-CM | POA: Diagnosis not present

## 2022-08-04 DIAGNOSIS — C3490 Malignant neoplasm of unspecified part of unspecified bronchus or lung: Secondary | ICD-10-CM

## 2022-08-04 DIAGNOSIS — Z87891 Personal history of nicotine dependence: Secondary | ICD-10-CM

## 2022-08-04 DIAGNOSIS — C3412 Malignant neoplasm of upper lobe, left bronchus or lung: Secondary | ICD-10-CM | POA: Insufficient documentation

## 2022-08-04 DIAGNOSIS — M810 Age-related osteoporosis without current pathological fracture: Secondary | ICD-10-CM | POA: Diagnosis not present

## 2022-08-04 DIAGNOSIS — F32A Depression, unspecified: Secondary | ICD-10-CM | POA: Diagnosis not present

## 2022-08-04 DIAGNOSIS — C3410 Malignant neoplasm of upper lobe, unspecified bronchus or lung: Secondary | ICD-10-CM | POA: Diagnosis not present

## 2022-08-04 DIAGNOSIS — C3491 Malignant neoplasm of unspecified part of right bronchus or lung: Secondary | ICD-10-CM | POA: Diagnosis not present

## 2022-08-04 DIAGNOSIS — R918 Other nonspecific abnormal finding of lung field: Secondary | ICD-10-CM | POA: Diagnosis not present

## 2022-08-04 HISTORY — PX: BRONCHIAL BRUSHINGS: SHX5108

## 2022-08-04 HISTORY — DX: Depression, unspecified: F32.A

## 2022-08-04 HISTORY — PX: HEMOSTASIS CONTROL: SHX6838

## 2022-08-04 HISTORY — DX: Solitary pulmonary nodule: R91.1

## 2022-08-04 HISTORY — PX: BRONCHIAL BIOPSY: SHX5109

## 2022-08-04 HISTORY — DX: Nontoxic single thyroid nodule: E04.1

## 2022-08-04 HISTORY — PX: VIDEO BRONCHOSCOPY: SHX5072

## 2022-08-04 HISTORY — DX: Age-related osteoporosis without current pathological fracture: M81.0

## 2022-08-04 HISTORY — DX: Insomnia, unspecified: G47.00

## 2022-08-04 SURGERY — BRONCHOSCOPY, WITH FLUOROSCOPY
Anesthesia: General | Laterality: Left

## 2022-08-04 MED ORDER — LIDOCAINE 2% (20 MG/ML) 5 ML SYRINGE
INTRAMUSCULAR | Status: DC | PRN
  Administered 2022-08-04: 60 mg via INTRAVENOUS

## 2022-08-04 MED ORDER — FENTANYL CITRATE (PF) 100 MCG/2ML IJ SOLN
INTRAMUSCULAR | Status: DC | PRN
  Administered 2022-08-04: 50 ug via INTRAVENOUS

## 2022-08-04 MED ORDER — SUGAMMADEX SODIUM 200 MG/2ML IV SOLN
INTRAVENOUS | Status: DC | PRN
  Administered 2022-08-04: 200 mg via INTRAVENOUS

## 2022-08-04 MED ORDER — LACTATED RINGERS IV SOLN: INTRAVENOUS | Status: DC

## 2022-08-04 MED ORDER — ROCURONIUM BROMIDE 10 MG/ML (PF) SYRINGE
PREFILLED_SYRINGE | INTRAVENOUS | Status: DC | PRN
  Administered 2022-08-04: 30 mg via INTRAVENOUS

## 2022-08-04 MED ORDER — SODIUM CHLORIDE (PF) 0.9 % IJ SOLN
PREFILLED_SYRINGE | INTRAMUSCULAR | Status: DC | PRN
  Administered 2022-08-04 (×4): 2 mL

## 2022-08-04 MED ORDER — FENTANYL CITRATE (PF) 100 MCG/2ML IJ SOLN: 25.0000 ug | INTRAMUSCULAR | Status: DC | PRN

## 2022-08-04 MED ORDER — OXYCODONE HCL 5 MG PO TABS: 5.0000 mg | ORAL_TABLET | Freq: Once | ORAL | Status: DC | PRN

## 2022-08-04 MED ORDER — MIDAZOLAM HCL 5 MG/5ML IJ SOLN
INTRAMUSCULAR | Status: DC | PRN
  Administered 2022-08-04: 1 mg via INTRAVENOUS

## 2022-08-04 MED ORDER — AMISULPRIDE (ANTIEMETIC) 5 MG/2ML IV SOLN: 10.0000 mg | Freq: Once | INTRAVENOUS | Status: DC | PRN

## 2022-08-04 MED ORDER — CHLORHEXIDINE GLUCONATE 0.12 % MT SOLN: 15.0000 mL | Freq: Once | OROMUCOSAL | Status: AC

## 2022-08-04 MED ORDER — ONDANSETRON HCL 4 MG/2ML IJ SOLN
INTRAMUSCULAR | Status: DC | PRN
  Administered 2022-08-04: 4 mg via INTRAVENOUS

## 2022-08-04 MED ORDER — PHENYLEPHRINE 80 MCG/ML (10ML) SYRINGE FOR IV PUSH (FOR BLOOD PRESSURE SUPPORT)
PREFILLED_SYRINGE | INTRAVENOUS | Status: DC | PRN
  Administered 2022-08-04: 80 ug via INTRAVENOUS

## 2022-08-04 MED ORDER — DEXAMETHASONE SODIUM PHOSPHATE 10 MG/ML IJ SOLN
INTRAMUSCULAR | Status: DC | PRN
  Administered 2022-08-04: 10 mg via INTRAVENOUS

## 2022-08-04 MED ORDER — OXYCODONE HCL 5 MG/5ML PO SOLN: 5.0000 mg | Freq: Once | ORAL | Status: DC | PRN

## 2022-08-04 MED ORDER — CHLORHEXIDINE GLUCONATE 0.12 % MT SOLN
OROMUCOSAL | Status: AC
  Administered 2022-08-04: 15 mL via OROMUCOSAL
  Filled 2022-08-04: qty 15

## 2022-08-04 MED ORDER — PROPOFOL 10 MG/ML IV BOLUS
INTRAVENOUS | Status: DC | PRN
  Administered 2022-08-04: 120 mg via INTRAVENOUS

## 2022-08-04 NOTE — Progress Notes (Signed)
Post bronchoscopy 08/04/2022.  PET scan ordered and hematology oncology referral made

## 2022-08-04 NOTE — Transfer of Care (Signed)
Immediate Anesthesia Transfer of Care Note  Patient: Evelyn Jordan  Procedure(s) Performed: VIDEO BRONCHOSCOPY WITH FLUORO (Left) HEMOSTASIS CONTROL BRONCHIAL BIOPSIES BRONCHIAL BRUSHINGS  Patient Location: PACU  Anesthesia Type:General  Level of Consciousness: awake, alert , and patient cooperative  Airway & Oxygen Therapy: Patient Spontanous Breathing and Patient connected to face mask oxygen  Post-op Assessment: Report given to RN and Post -op Vital signs reviewed and stable  Post vital signs: Reviewed and stable  Last Vitals:  Vitals Value Taken Time  BP 151/88 08/04/22 1105  Temp    Pulse 91 08/04/22 1106  Resp 24 08/04/22 1106  SpO2 100 % 08/04/22 1106  Vitals shown include unvalidated device data.  Last Pain:  Vitals:   08/04/22 0822  TempSrc:   PainSc: 0-No pain         Complications: No notable events documented.

## 2022-08-04 NOTE — Discharge Instructions (Signed)
Flexible Bronchoscopy, Care After This sheet gives you information about how to care for yourself after your test. Your doctor may also give you more specific instructions. If you have problems or questions, contact your doctor. Follow these instructions at home: Eating and drinking When your numbness is gone and your cough and gag reflexes have come back, you may: Eat only soft foods. Slowly drink liquids. The day after the test, go back to your normal diet. Driving Do not drive for 24 hours if you were given a medicine to help you relax (sedative). Do not drive or use heavy machinery while taking prescription pain medicine. General instructions  Take over-the-counter and prescription medicines only as told by your doctor. Return to your normal activities as told. Ask what activities are safe for you. Do not use any products that have nicotine or tobacco in them. This includes cigarettes and e-cigarettes. If you need help quitting, ask your doctor. Keep all follow-up visits as told by your doctor. This is important. It is very important if you had a tissue sample (biopsy) taken. Get help right away if: You have shortness of breath that gets worse. You get light-headed. You feel like you are going to pass out (faint). You have chest pain. You cough up: More than a little blood. More blood than before. Summary Do not eat or drink anything (not even water) for 2 hours after your test, or until your numbing medicine wears off. Do not use cigarettes. Do not use e-cigarettes. Get help right away if you have chest pain.  Please call our office for any questions or concerns.  [phone].  This information is not intended to replace advice given to you by your health care provider. Make sure you discuss any questions you have with your health care provider. Document Released: 03/23/2009 Document Revised: 05/08/2017 Document Reviewed: 06/13/2016 Elsevier Patient Education  2020 Elsevier  Inc.  

## 2022-08-04 NOTE — Anesthesia Postprocedure Evaluation (Signed)
Anesthesia Post Note  Patient: Evelyn Jordan  Procedure(s) Performed: VIDEO BRONCHOSCOPY WITH FLUORO (Left) HEMOSTASIS CONTROL BRONCHIAL BIOPSIES BRONCHIAL BRUSHINGS     Patient location during evaluation: PACU Anesthesia Type: General Level of consciousness: awake Pain management: pain level controlled Vital Signs Assessment: post-procedure vital signs reviewed and stable Respiratory status: spontaneous breathing, nonlabored ventilation and respiratory function stable Cardiovascular status: blood pressure returned to baseline and stable Postop Assessment: no apparent nausea or vomiting Anesthetic complications: no   No notable events documented.  Last Vitals:  Vitals:   08/04/22 1120 08/04/22 1135  BP: 121/88 (!) 145/81  Pulse: 82 85  Resp: 19 17  Temp:  36.8 C  SpO2: 96% 98%    Last Pain:  Vitals:   08/04/22 1135  TempSrc:   PainSc: 0-No pain                 Nilda Simmer

## 2022-08-04 NOTE — Anesthesia Procedure Notes (Signed)
Procedure Name: Intubation Date/Time: 08/04/2022 10:15 AM  Performed by: Colin Benton, CRNAPre-anesthesia Checklist: Patient identified, Emergency Drugs available, Suction available and Patient being monitored Patient Re-evaluated:Patient Re-evaluated prior to induction Oxygen Delivery Method: Circle system utilized Preoxygenation: Pre-oxygenation with 100% oxygen Induction Type: IV induction Ventilation: Mask ventilation without difficulty Laryngoscope Size: Miller and 2 Grade View: Grade III Tube type: Oral Tube size: 8.5 mm Number of attempts: 1 Airway Equipment and Method: Bougie stylet Placement Confirmation: ETT inserted through vocal cords under direct vision, positive ETCO2 and breath sounds checked- equal and bilateral Secured at: 23 cm Tube secured with: Tape Dental Injury: Teeth and Oropharynx as per pre-operative assessment  Comments: DL x 1 with Mil 2.  Grade 2b view but unable to pass ETT anterior.   Bougie stylet used.  ETT passed easily over bougie.  EBBS and VSS.

## 2022-08-04 NOTE — Op Note (Signed)
Video Bronchoscopy Procedure Note  Date of Operation: 08/04/2022  Pre-op Diagnosis: Lingular airway narrowing and lingular collapse  Post-op Diagnosis: Lingular mass  Surgeon: Baltazar Apo  Assistants: none  Anesthesia: General  Operation: Flexible video fiberoptic bronchoscopy and biopsies.  Estimated Blood Loss: 123456 cc cc  Complications: none noted  Indications and History: Evelyn Jordan is 72 y.o. with history of former tobacco use.  She noticed hemoptysis and some progressive dyspnea that prompted chest imaging.  CT 07/24/2022 revealed narrowed lingular bronchus with air bronchograms and consolidation.  Recommendation was to perform video fiberoptic bronchoscopy with biopsies. The risks, benefits, complications, treatment options and expected outcomes were discussed with the patient.  The possibilities of pneumothorax, pneumonia, reaction to medication, pulmonary aspiration, perforation of a viscus, bleeding, failure to diagnose a condition and creating a complication requiring transfusion or operation were discussed with the patient who freely signed the consent.    Description of Procedure: The patient was seen in the Preoperative Area, was examined and was deemed appropriate to proceed.  The patient was taken to [facility] endoscopy room 3, identified as Evelyn Jordan and the procedure verified as Flexible Video Fiberoptic Bronchoscopy.  A Time Out was held and the above information confirmed.   General anesthesia was initiated and patient was endotracheally intubated.  The video fiberoptic bronchoscope was introduced via the endotracheal tube and a general inspection was performed which showed normal distal trachea, normal main carina. The R sided airways were inspected and showed normal RUL, BI, RML and RLL. The L side was then inspected.  There was an endobronchial lesion emanating from the lingular bronchus and impacting the other left upper lobe airways, left lower lobe  airways due to extrinsic compression.  The bronchoscope would not pass into the left upper lobe although instruments would pass.  The bronchoscope was able to pass into the left lower lobe airways.  These were normal in appearance.  1:10000 epi was injected onto the lingular lesion in divided doses, 8 cc total. Endobronchial brushings, endobronchial forceps biopsies were performed. There was some initial moderate bleeding that stopped quickly by the end of the case.  EBL approximately 25-30 cc.  The patient tolerated the procedure well. The bronchoscope was removed. There were no obvious complications.   Samples: 1. Endobronchial brushings from lingular mass 2. Endobronchial forceps biopsies from lingular mass  Plans:  We will review the cytology, pathology results with the patient when they become available.  Outpatient followup will be with Dr Lamonte Sakai.    Baltazar Apo, MD, PhD 08/04/2022, 10:50 AM [facility] [phone] or if no answer [phone]

## 2022-08-04 NOTE — Interval H&P Note (Signed)
History and Physical Interval Note:  08/04/2022 8:53 AM  Evelyn Jordan  has presented today for surgery, with the diagnosis of LEFT LEINGULAR COLLAPSE.  The various methods of treatment have been discussed with the patient and family. After consideration of risks, benefits and other options for treatment, the patient has consented to  Procedure(s): VIDEO BRONCHOSCOPY WITH FLUORO (Left) as a surgical intervention.  The patient's history has been reviewed, patient examined, no change in status, stable for surgery.  I have reviewed the patient's chart and labs.  Questions were answered to the patient's satisfaction.     Collene Gobble

## 2022-08-05 ENCOUNTER — Encounter: Payer: Self-pay | Admitting: Emergency Medicine

## 2022-08-05 ENCOUNTER — Telehealth: Payer: Self-pay | Admitting: Emergency Medicine

## 2022-08-05 ENCOUNTER — Encounter: Payer: Self-pay | Admitting: Family Medicine

## 2022-08-05 MED ORDER — ALPRAZOLAM 0.5 MG PO TABS: 0.5000 mg | ORAL_TABLET | Freq: Every evening | ORAL | 0 refills | Status: DC | PRN

## 2022-08-05 NOTE — Telephone Encounter (Signed)
Yes - please have the order sent to [facility]. May need to the PCC's to print out the EPIC order to send

## 2022-08-05 NOTE — Telephone Encounter (Signed)
PCCs, please advise when the order has been faxed so we can notify pt.

## 2022-08-05 NOTE — Telephone Encounter (Signed)
PT had bronchoscopy done yesterday that was pos for cancer. A PET was ordered but she can have one done much sooner at [place]. Fax # [phone]. She would like to know if we can fax over a referral. It is also closer to her house.   Pls. Call PT to advise @ [phone]  IT is her Evelyn Jordan. Wanted this to go [facility] as she is distressed over the diag. TY.

## 2022-08-06 ENCOUNTER — Other Ambulatory Visit: Payer: Self-pay

## 2022-08-06 ENCOUNTER — Telehealth: Payer: Self-pay | Admitting: Internal Medicine

## 2022-08-06 ENCOUNTER — Encounter (HOSPITAL_COMMUNITY): Payer: Self-pay | Admitting: Emergency Medicine

## 2022-08-06 DIAGNOSIS — R918 Other nonspecific abnormal finding of lung field: Secondary | ICD-10-CM

## 2022-08-06 NOTE — Telephone Encounter (Signed)
I called [facility] [phone] and spoke to [facility].  She states to fax order to them at [phone] and they will call pt to schedule.  They have openings next week.  Pt originally scheduled at [facility] on 3/13.  I cancelled PET at [facility] and faxed order.  I spoke to pt & made her aware [facility] would be calling her for PET.  Nothing further needed.

## 2022-08-06 NOTE — Progress Notes (Signed)
Labs for new pt appt on 3/4

## 2022-08-06 NOTE — Telephone Encounter (Signed)
I faxed order to [facility] this morning and I spoke to pt then and made her aware.  Will route MyChart message back to triage to be closed.

## 2022-08-06 NOTE — Telephone Encounter (Signed)
Scheduled appt per 2/26 referral. Pt is aware of appt date and time. Pt is aware to arrive 15 mins prior to appt time and to bring and updated insurance card. Pt is aware of appt location.

## 2022-08-07 NOTE — Progress Notes (Signed)
Navigator made introductory phone call to the patient today. Explained role of a nurse navigator. Navigator inquired to pt if she had been able to reschedule her PET scan to an earlier date. Pt states she was able to get an appt in [facility] for 3/6 @ 9:30. Pt is aware of location, date and time of her appointment. Her husband will be accompanying her to the appt. No questions or concerns at this time voiced by the pt at this time.

## 2022-08-08 ENCOUNTER — Encounter: Payer: Self-pay | Admitting: Emergency Medicine

## 2022-08-08 LAB — CYTOLOGY - NON PAP

## 2022-08-08 NOTE — Telephone Encounter (Signed)
Called to discuss results with the patient.  Unfortunately the final cytology report is still pending.  Based on the videobronchoscopy anticipate that this is going to be some form of primary lung cancer.  As soon as cytology finalizes then I will call her.  She was able to get her PET scan in [facility], has an office visit with Dr. Julien Nordmann next Monday.

## 2022-08-11 ENCOUNTER — Inpatient Hospital Stay: Payer: Medicare Other

## 2022-08-11 ENCOUNTER — Encounter: Payer: Self-pay | Admitting: Emergency Medicine

## 2022-08-11 ENCOUNTER — Encounter: Payer: Self-pay | Admitting: Medical Oncology

## 2022-08-11 ENCOUNTER — Inpatient Hospital Stay (HOSPITAL_BASED_OUTPATIENT_CLINIC_OR_DEPARTMENT_OTHER): Payer: Medicare Other | Admitting: Internal Medicine

## 2022-08-11 ENCOUNTER — Other Ambulatory Visit: Payer: Self-pay

## 2022-08-11 VITALS — BP 135/79 | HR 86 | Temp 98.3°F | Resp 16 | Wt 120.8 lb

## 2022-08-11 DIAGNOSIS — C3492 Malignant neoplasm of unspecified part of left bronchus or lung: Secondary | ICD-10-CM | POA: Insufficient documentation

## 2022-08-11 DIAGNOSIS — C3482 Malignant neoplasm of overlapping sites of left bronchus and lung: Secondary | ICD-10-CM | POA: Diagnosis not present

## 2022-08-11 DIAGNOSIS — Z5111 Encounter for antineoplastic chemotherapy: Secondary | ICD-10-CM | POA: Insufficient documentation

## 2022-08-11 DIAGNOSIS — C3412 Malignant neoplasm of upper lobe, left bronchus or lung: Secondary | ICD-10-CM | POA: Diagnosis not present

## 2022-08-11 DIAGNOSIS — G4700 Insomnia, unspecified: Secondary | ICD-10-CM | POA: Insufficient documentation

## 2022-08-11 DIAGNOSIS — C3490 Malignant neoplasm of unspecified part of unspecified bronchus or lung: Secondary | ICD-10-CM | POA: Insufficient documentation

## 2022-08-11 DIAGNOSIS — Z79899 Other long term (current) drug therapy: Secondary | ICD-10-CM | POA: Insufficient documentation

## 2022-08-11 DIAGNOSIS — Z87891 Personal history of nicotine dependence: Secondary | ICD-10-CM | POA: Insufficient documentation

## 2022-08-11 DIAGNOSIS — Z510 Encounter for antineoplastic radiation therapy: Secondary | ICD-10-CM | POA: Insufficient documentation

## 2022-08-11 DIAGNOSIS — R918 Other nonspecific abnormal finding of lung field: Secondary | ICD-10-CM

## 2022-08-11 DIAGNOSIS — F32A Depression, unspecified: Secondary | ICD-10-CM | POA: Insufficient documentation

## 2022-08-11 DIAGNOSIS — R110 Nausea: Secondary | ICD-10-CM | POA: Insufficient documentation

## 2022-08-11 LAB — CBC WITH DIFFERENTIAL (CANCER CENTER ONLY)
Abs Immature Granulocytes: 0.04 10*3/uL (ref 0.00–0.07)
Basophils Absolute: 0.1 10*3/uL (ref 0.0–0.1)
Basophils Relative: 1 %
Eosinophils Absolute: 0.3 10*3/uL (ref 0.0–0.5)
Eosinophils Relative: 4 %
HCT: 36.3 % (ref 36.0–46.0)
Hemoglobin: 12.1 g/dL (ref 12.0–15.0)
Immature Granulocytes: 1 %
Lymphocytes Relative: 12 %
Lymphs Abs: 1 10*3/uL (ref 0.7–4.0)
MCH: 29.6 pg (ref 26.0–34.0)
MCHC: 33.3 g/dL (ref 30.0–36.0)
MCV: 88.8 fL (ref 80.0–100.0)
Monocytes Absolute: 0.9 10*3/uL (ref 0.1–1.0)
Monocytes Relative: 11 %
Neutro Abs: 5.7 10*3/uL (ref 1.7–7.7)
Neutrophils Relative %: 71 %
Platelet Count: 329 10*3/uL (ref 150–400)
RBC: 4.09 MIL/uL (ref 3.87–5.11)
RDW: 13.2 % (ref 11.5–15.5)
WBC Count: 8 10*3/uL (ref 4.0–10.5)
nRBC: 0 % (ref 0.0–0.2)

## 2022-08-11 LAB — CMP (CANCER CENTER ONLY)
ALT: 9 U/L (ref 0–44)
AST: 11 U/L — ABNORMAL LOW (ref 15–41)
Albumin: 3.6 g/dL (ref 3.5–5.0)
Alkaline Phosphatase: 76 U/L (ref 38–126)
Anion gap: 6 (ref 5–15)
BUN: 9 mg/dL (ref 8–23)
CO2: 28 mmol/L (ref 22–32)
Calcium: 9.3 mg/dL (ref 8.9–10.3)
Chloride: 104 mmol/L (ref 98–111)
Creatinine: 0.79 mg/dL (ref 0.44–1.00)
GFR, Estimated: >60 mL/min (ref >60)
Glucose, Bld: 108 mg/dL — ABNORMAL HIGH (ref 70–99)
Potassium: 4 mmol/L (ref 3.5–5.1)
Sodium: 138 mmol/L (ref 135–145)
Total Bilirubin: 0.3 mg/dL (ref 0.3–1.2)
Total Protein: 6.9 g/dL (ref 6.5–8.1)

## 2022-08-11 NOTE — Progress Notes (Signed)
Letter written and pended for the patient.  She needs this for her travel insurance to cancel travel plans given her new diagnosis.  Thank you

## 2022-08-11 NOTE — Progress Notes (Signed)
START ON PATHWAY REGIMEN - Non-Small Cell Lung     A cycle is every 7 days, concurrent with RT:     Paclitaxel      Carboplatin   **Always confirm dose/schedule in your pharmacy ordering system**  Patient Characteristics: Preoperative or Nonsurgical Candidate (Clinical Staging), Stage III - Nonsurgical Candidate (Nonsquamous and Squamous), PS = 0, 1 Therapeutic Status: Preoperative or Nonsurgical Candidate (Clinical Staging) AJCC T Category: cT3 AJCC N Category: cN2 AJCC M Category: cM0 AJCC 8 Stage Grouping: IIIB ECOG Performance Status: 1 Intent of Therapy: Curative Intent, Discussed with Patient

## 2022-08-11 NOTE — Progress Notes (Signed)
Travel form completed/signed and given to pt.

## 2022-08-11 NOTE — Progress Notes (Signed)
[place] [phone]   Fax:([phone]) [phone]  CONSULT NOTE  REFERRING PHYSICIAN: Dr. Baltazar Apo  REASON FOR CONSULTATION:  72 years old white female recently diagnosed with lung cancer.  HPI Evelyn Jordan is a 72 y.o. female with past medical history significant for reactive depression, insomnia, osteoporosis, history of thyroid nodule as well as long history of smoking but quit 12 years ago.  The patient mentions that she was camping in [place] when she started having persistent cough with blood-tinged sputum.  She returned back to [facility] for further evaluation.  She was seen by her primary care physician and had chest x-ray on July 22, 2022 and that showed masslike left perihilar opacity.  This was followed by CT scan of the chest with contrast on 07/24/2022 and that showed consolidation and collapse of the left lingula that is new from the prior CT.  There was focal narrowing of the left lingular bronchus with air bronchogram present.  This suggest a possible endobronchial lesion.  The esophagus was decompressed and there was no significant adenopathy.  There was also scattered calcified granulomas without change.  The also had a mammogram on 07/31/2022 that was unremarkable.  On August 04, 2022 the patient underwent video bronchoscopy under the care of Dr. Lamonte Sakai.  There was an endobronchial lesion emanating from the lingular bronchus and impacting the other left upper lobe airways, left lower lobe airways due to extrinsic compression.  Endobronchial brushing as well as endobronchial biopsies from the lingular mass were performed.  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 Immunohistochemical stains show that the tumor cells are positive for p63 and CK5/6 while they are negative for TTF-1, consistent with above interpretation.  Dr. Lamonte Sakai kindly referred the patient to me today for evaluation and  recommendation regarding treatment of her condition.  The patient is scheduled for a PET scan at [facility] hospital in 2 days. When seen today she is complaining of fatigue and weakness as well as shortness of breath and dry cough with chest pain with the cough.  She denied having any significant nausea, vomiting, diarrhea or constipation.  She has no more hemoptysis.  She denied having any weight loss or night sweats.  She has no headache or visual changes. Family history significant for mother died at age 64 with Alzheimer and father died at age 66 with a stroke. The patient is married and has 3 daughters.  She lives in [place].  She was accompanied by her husband Archie.  The patient used to work as a Marine scientist.  She has a history of smoking 1 pack/day for around 40 years and quit 12 years ago.  She drinks alcohol occasionally and no history of drug abuse.  HPI  Past Medical History:  Diagnosis Date   Depression    Insomnia    Osteoporosis    Pulmonary nodule    Thyroid nodule     Past Surgical History:  Procedure Laterality Date   ANKLE SURGERY Right    BREAST BIOPSY Right    benign 2013   BRONCHIAL BIOPSY  08/04/2022   Procedure: BRONCHIAL BIOPSIES;  Surgeon: Collene Gobble, MD;  Location: [place];  Service: Cardiopulmonary;;   BRONCHIAL BRUSHINGS  08/04/2022   Procedure: BRONCHIAL BRUSHINGS;  Surgeon: Collene Gobble, MD;  Location: [facility] ENDOSCOPY;  Service: Cardiopulmonary;;   HEMOSTASIS CONTROL  08/04/2022   Procedure: HEMOSTASIS CONTROL;  Surgeon: Collene Gobble,  MD;  Location: [place];  Service: Cardiopulmonary;;   repair of a torn meniscus Left 05/2018   VIDEO BRONCHOSCOPY Left 08/04/2022   Procedure: VIDEO BRONCHOSCOPY WITH FLUORO;  Surgeon: Collene Gobble, MD;  Location: [place];  Service: Cardiopulmonary;  Laterality: Left;    Family History  Problem Relation Age of Onset   Alzheimer's disease Mother    Hypertension Father    Stroke  Father    Breast cancer Neg Hx     Social History Social History   Tobacco Use   Smoking status: Former    Packs/day: 1.00    Years: 40.00    Total pack years: 40.00    Types: Cigarettes    Start date: 48    Quit date: 2012    Years since quitting: 12.1   Smokeless tobacco: Never  Vaping Use   Vaping Use: Never used  Substance Use Topics   Alcohol use: Yes    Comment: Rarely   Drug use: No    Allergies  Allergen Reactions   Compazine [Prochlorperazine Edisylate]     Neck hyperextends    Current Outpatient Medications  Medication Sig Dispense Refill   ALPRAZolam (XANAX) 0.5 MG tablet Take 1 tablet (0.5 mg total) by mouth at bedtime as needed for anxiety or sleep. 30 tablet 0   docusate sodium (COLACE) 100 MG capsule Take 100 mg by mouth every other day.     No current facility-administered medications for this visit.    Review of Systems  Constitutional: positive for fatigue Eyes: negative Ears, nose, mouth, throat, and face: negative Respiratory: positive for cough, dyspnea on exertion, and pleurisy/chest pain Cardiovascular: negative Gastrointestinal: negative Genitourinary:negative Integument/breast: negative Hematologic/lymphatic: negative Musculoskeletal:negative Neurological: negative Behavioral/Psych: negative Endocrine: negative Allergic/Immunologic: negative  Physical Exam  [id number], healthy, no distress, well nourished, and well developed SKIN: skin color, texture, turgor are normal, no rashes or significant lesions HEAD: Normocephalic, No masses, lesions, tenderness or abnormalities EYES: normal, PERRLA, Conjunctiva are pink and non-injected EARS: External ears normal, Canals clear OROPHARYNX:no exudate, no erythema, and lips, buccal mucosa, and tongue normal  NECK: supple, no adenopathy, no JVD LYMPH:  no palpable lymphadenopathy, no hepatosplenomegaly BREAST:not examined LUNGS: clear to auscultation , and palpation HEART: regular rate  & rhythm, no murmurs, and no gallops ABDOMEN:abdomen soft, non-tender, normal bowel sounds, and no masses or organomegaly BACK: Back symmetric, no curvature., No CVA tenderness EXTREMITIES:no joint deformities, effusion, or inflammation, no edema  NEURO: alert & oriented x 3 with fluent speech, no focal motor/sensory deficits  PERFORMANCE STATUS: ECOG 1  LABORATORY DATA: Lab Results  Component Value Date   WBC 8.0 08/11/2022   HGB 12.1 08/11/2022   HCT 36.3 08/11/2022   MCV 88.8 08/11/2022   PLT 329 08/11/2022      Chemistry      Component Value Date/Time   NA 138 08/11/2022 1334   NA 141 03/07/2015 0000   K 4.0 08/11/2022 1334   CL 104 08/11/2022 1334   CO2 28 08/11/2022 1334   BUN 9 08/11/2022 1334   CREATININE 0.79 08/11/2022 1334   GLU 103 03/07/2015 0000      Component Value Date/Time   CALCIUM 9.3 08/11/2022 1334   ALKPHOS 76 08/11/2022 1334   AST 11 (L) 08/11/2022 1334   ALT 9 08/11/2022 1334   BILITOT 0.3 08/11/2022 1334       RADIOGRAPHIC STUDIES: MM 3D SCREEN BREAST BILATERAL  Result Date: 08/04/2022 CLINICAL DATA:  Screening. EXAM: DIGITAL SCREENING BILATERAL MAMMOGRAM  WITH TOMOSYNTHESIS AND CAD TECHNIQUE: Bilateral screening digital craniocaudal and mediolateral oblique mammograms were obtained. Bilateral screening digital breast tomosynthesis was performed. The images were evaluated with computer-aided detection. COMPARISON:  Previous exam(s). ACR Breast Density Category b: There are scattered areas of fibroglandular density. FINDINGS: There are no findings suspicious for malignancy. IMPRESSION: No mammographic evidence of malignancy. A result letter of this screening mammogram will be mailed directly to the patient. RECOMMENDATION: Screening mammogram in one year. (Code:SM-B-01Y) BI-RADS CATEGORY  1: Negative. Electronically Signed   By: Lovey Newcomer M.D.   On: 08/04/2022 15:38   CT Chest W Contrast  Result Date: 07/24/2022 CLINICAL DATA:  Abnormal x-ray  with lung nodule greater than 1 cm. Hemoptysis. EXAM: CT CHEST WITH CONTRAST TECHNIQUE: Multidetector CT imaging of the chest was performed during intravenous contrast administration. RADIATION DOSE REDUCTION: This exam was performed according to the departmental dose-optimization program which includes automated exposure control, adjustment of the mA and/or kV according to patient size and/or use of iterative reconstruction technique. CONTRAST:  113m OMNIPAQUE IOHEXOL 300 MG/ML  SOLN COMPARISON:  Chest radiograph 07/22/2022.  CT 04/05/2020 FINDINGS: Cardiovascular: Normal heart size. Small pericardial effusion. Normal caliber thoracic aorta. No evidence of aortic dissection. Central pulmonary arteries are patent without evidence of significant pulmonary embolus. Calcification of the aorta and coronary arteries. Mediastinum/Nodes: Esophagus is decompressed. No significant lymphadenopathy. Thyroid gland is unremarkable. Lungs/Pleura: Consolidation and collapse of the left lingula, new since prior CT and corresponding to chest radiographic abnormalities. There is focal narrowing of the left lingular bronchus with air bronchograms present. This suggest a possible endobronchial lesion. Consider bronchoscopy for further evaluation. Multiple calcified granulomas are demonstrated in the lungs, unchanged since prior study. No pleural effusions. No pneumothorax. Upper Abdomen: Mild diffuse fatty infiltration of the liver. No acute abnormalities. Musculoskeletal: Postoperative changes in the anterior mandible. Degenerative changes in the spine. No destructive bone lesions. IMPRESSION: 1. Consolidation and collapse of the left lingula with focal narrowing of the left lingular bronchus and air bronchograms. Changes could indicate an endobronchial lesion. Consider bronchoscopy for further evaluation. 2. Scattered calcified granulomas are without change. 3. Fatty infiltration of the liver. 4. Normal heart size with small  pericardial effusion. Electronically Signed   By: WLucienne CapersM.D.   On: 07/24/2022 20:07   DG Chest 2 View  Result Date: 07/24/2022 CLINICAL DATA:  Hemoptysis, cough. EXAM: CHEST - 2 VIEW COMPARISON:  CT chest 04/05/2020. FINDINGS: Trachea is midline. Heart size likely within normal limits. Left perihilar masslike fullness with lingular collapse. Scattered calcified granulomas in the right lung. No pleural fluid. IMPRESSION: Masslike left perihilar opacification with lingular collapse, findings highly indicative of primary bronchogenic carcinoma. Recommend CT chest with contrast in further evaluation. These results will be called to the ordering clinician or representative by the Radiologist Assistant, and communication documented in the PACS or [employer] Electronically Signed   By: MLorin PicketM.D.   On: 07/24/2022 13:44    ASSESSMENT: This is a very pleasant 72years old white female recently diagnosed with a stage IIIb (T3, N2, M0) non-small cell lung cancer, squamous cell carcinoma presented with obstructive left lingular mass with consolidation and collapse of the lingula as well as part of the left upper lobe and likely invasion of the mediastinum diagnosed in February 2024.   PLAN: I had a lengthy discussion with the patient and her husband today about her current disease stage, prognosis and treatment options. I personally and independently reviewed the scan images and  discussed the result and showed the images to the patient and her husband today. I recommended for the patient to keep her appointment for the PET scan to complete her staging workup and to rule out any metastatic disease.  I will also order MRI of the brain to rule out brain metastasis. I explained to the patient that she is unlikely to be surgical candidate because of the central location of the tumor and this may require left pneumonectomy if she is a surgical candidate. I recommended for the patient a course  of concurrent chemoradiation with weekly carboplatin for AUC of 2 and paclitaxel 45 Mg/M2 for 6/7 weeks followed by consolidation immunotherapy if the patient has no evidence for disease progression after the induction phase. If the remaining imaging studies showed evidence for metastatic disease, we will change the care plan accordingly. I discussed with the patient the adverse effect of this treatment including but not limited to alopecia, myelosuppression, nausea and vomiting, peripheral neuropathy, liver or renal dysfunction. I referred the patient to radiation oncology. She is expected to have a chemotherapy education class before the first dose of her treatment. I will schedule her to start the first cycle of her systemic chemotherapy on August 18, 2022 and hopefully she will be able to start radiation that week too. I will call her pharmacy with prescription for Zofran for nausea. I will see the patient back for follow-up visit 1 week after the start of her treatment. She was advised to call immediately if she has any other concerning symptoms in the interval.  The patient voices understanding of current disease status and treatment options and is in agreement with the current care plan.  All questions were answered. The patient knows to call the clinic with any problems, questions or concerns. We can certainly see the patient much sooner if necessary.  Thank you so much for allowing me to participate in the care of [employer]. I will continue to follow up the patient with you and assist in her care.  The total time spent in the appointment was 90 minutes.  Disclaimer: This note was dictated with voice recognition software. Similar sounding words can inadvertently be transcribed and may not be corrected upon review.   Eilleen Kempf August 11, 2022, 2:33 PM

## 2022-08-11 NOTE — Telephone Encounter (Signed)
Letter was written, and pt is planning to come pick it up at office.

## 2022-08-12 ENCOUNTER — Encounter: Payer: Self-pay | Admitting: Family Medicine

## 2022-08-12 ENCOUNTER — Encounter: Payer: Self-pay | Admitting: Internal Medicine

## 2022-08-12 ENCOUNTER — Other Ambulatory Visit: Payer: Self-pay | Admitting: Internal Medicine

## 2022-08-12 MED ORDER — ONDANSETRON HCL 8 MG PO TABS: 8.0000 mg | ORAL_TABLET | Freq: Three times a day (TID) | ORAL | 0 refills | Status: DC | PRN

## 2022-08-12 MED ORDER — HYDROCODONE BIT-HOMATROP MBR 5-1.5 MG/5ML PO SOLN: 5.0000 mL | Freq: Four times a day (QID) | ORAL | 0 refills | Status: DC | PRN

## 2022-08-13 ENCOUNTER — Encounter: Payer: Self-pay | Admitting: Internal Medicine

## 2022-08-13 ENCOUNTER — Other Ambulatory Visit: Payer: Self-pay

## 2022-08-13 DIAGNOSIS — C3402 Malignant neoplasm of left main bronchus: Secondary | ICD-10-CM | POA: Diagnosis not present

## 2022-08-13 DIAGNOSIS — J984 Other disorders of lung: Secondary | ICD-10-CM | POA: Diagnosis not present

## 2022-08-13 DIAGNOSIS — I700 Atherosclerosis of aorta: Secondary | ICD-10-CM | POA: Diagnosis not present

## 2022-08-13 DIAGNOSIS — C44329 Squamous cell carcinoma of skin of other parts of face: Secondary | ICD-10-CM | POA: Diagnosis not present

## 2022-08-13 DIAGNOSIS — J439 Emphysema, unspecified: Secondary | ICD-10-CM | POA: Diagnosis not present

## 2022-08-13 NOTE — Telephone Encounter (Signed)
Please remember I am happy to take any new patients that are family members of my current patients or referrals from them. No need to ask about these.   Please contact this patients daughter to schedule new patient appointment.   Thank you! Dr. Jonni Sanger

## 2022-08-14 ENCOUNTER — Other Ambulatory Visit: Payer: Self-pay

## 2022-08-14 NOTE — Progress Notes (Signed)
The proposed treatment discussed in conference is for discussion purpose only and is not a binding recommendation.  The patients have not been physically examined, or presented with their treatment options.  Therefore, final treatment plans cannot be decided.  

## 2022-08-15 ENCOUNTER — Ambulatory Visit: Payer: Medicare Other

## 2022-08-18 ENCOUNTER — Ambulatory Visit (HOSPITAL_COMMUNITY)
Admission: RE | Admit: 2022-08-18 | Discharge: 2022-08-18 | Disposition: A | Discharge status: Home / Self Care | Payer: Medicare Other | Source: Ambulatory Visit | Attending: Internal Medicine | Admitting: Internal Medicine

## 2022-08-18 ENCOUNTER — Inpatient Hospital Stay: Payer: Medicare Other

## 2022-08-18 ENCOUNTER — Other Ambulatory Visit: Payer: Self-pay | Admitting: Radiation Oncology

## 2022-08-18 ENCOUNTER — Ambulatory Visit
Admission: RE | Admit: 2022-08-18 | Discharge: 2022-08-18 | Disposition: A | Discharge status: Home / Self Care | Payer: Self-pay | Source: Ambulatory Visit | Attending: Radiation Oncology | Admitting: Radiation Oncology

## 2022-08-18 VITALS — BP 128/71 | HR 76 | Temp 98.3°F | Resp 17

## 2022-08-18 DIAGNOSIS — D320 Benign neoplasm of cerebral meninges: Secondary | ICD-10-CM | POA: Diagnosis not present

## 2022-08-18 DIAGNOSIS — C3490 Malignant neoplasm of unspecified part of unspecified bronchus or lung: Secondary | ICD-10-CM

## 2022-08-18 LAB — CMP (CANCER CENTER ONLY)
ALT: 8 U/L (ref 0–44)
AST: 12 U/L — ABNORMAL LOW (ref 15–41)
Albumin: 3.4 g/dL — ABNORMAL LOW (ref 3.5–5.0)
Alkaline Phosphatase: 64 U/L (ref 38–126)
Anion gap: 8 (ref 5–15)
BUN: 11 mg/dL (ref 8–23)
CO2: 27 mmol/L (ref 22–32)
Calcium: 9 mg/dL (ref 8.9–10.3)
Chloride: 105 mmol/L (ref 98–111)
Creatinine: 0.89 mg/dL (ref 0.44–1.00)
GFR, Estimated: >60 mL/min (ref >60)
Glucose, Bld: 130 mg/dL — ABNORMAL HIGH (ref 70–99)
Potassium: 3.5 mmol/L (ref 3.5–5.1)
Sodium: 140 mmol/L (ref 135–145)
Total Bilirubin: 0.3 mg/dL (ref 0.3–1.2)
Total Protein: 6.6 g/dL (ref 6.5–8.1)

## 2022-08-18 LAB — CBC WITH DIFFERENTIAL (CANCER CENTER ONLY)
Abs Immature Granulocytes: 0.03 10*3/uL (ref 0.00–0.07)
Basophils Absolute: 0.1 10*3/uL (ref 0.0–0.1)
Basophils Relative: 1 %
Eosinophils Absolute: 0.3 10*3/uL (ref 0.0–0.5)
Eosinophils Relative: 4 %
HCT: 36.5 % (ref 36.0–46.0)
Hemoglobin: 11.9 g/dL — ABNORMAL LOW (ref 12.0–15.0)
Immature Granulocytes: 0 %
Lymphocytes Relative: 11 %
Lymphs Abs: 0.8 10*3/uL (ref 0.7–4.0)
MCH: 29.3 pg (ref 26.0–34.0)
MCHC: 32.6 g/dL (ref 30.0–36.0)
MCV: 89.9 fL (ref 80.0–100.0)
Monocytes Absolute: 0.7 10*3/uL (ref 0.1–1.0)
Monocytes Relative: 9 %
Neutro Abs: 5.8 10*3/uL (ref 1.7–7.7)
Neutrophils Relative %: 75 %
Platelet Count: 344 10*3/uL (ref 150–400)
RBC: 4.06 MIL/uL (ref 3.87–5.11)
RDW: 13.2 % (ref 11.5–15.5)
WBC Count: 7.7 10*3/uL (ref 4.0–10.5)
nRBC: 0 % (ref 0.0–0.2)

## 2022-08-18 MED ORDER — DIPHENHYDRAMINE HCL 50 MG/ML IJ SOLN
50.0000 mg | Freq: Once | INTRAMUSCULAR | Status: AC
  Administered 2022-08-18: 50 mg via INTRAVENOUS
  Filled 2022-08-18: qty 1

## 2022-08-18 MED ORDER — SODIUM CHLORIDE 0.9 % IV SOLN
139.2000 mg | Freq: Once | INTRAVENOUS | Status: AC
  Administered 2022-08-18: 140 mg via INTRAVENOUS
  Filled 2022-08-18: qty 14

## 2022-08-18 MED ORDER — FAMOTIDINE IN NACL 20-0.9 MG/50ML-% IV SOLN
20.0000 mg | Freq: Once | INTRAVENOUS | Status: AC
  Administered 2022-08-18: 20 mg via INTRAVENOUS
  Filled 2022-08-18: qty 50

## 2022-08-18 MED ORDER — SODIUM CHLORIDE 0.9 % IV SOLN
10.0000 mg | Freq: Once | INTRAVENOUS | Status: AC
  Administered 2022-08-18: 10 mg via INTRAVENOUS
  Filled 2022-08-18: qty 10

## 2022-08-18 MED ORDER — PALONOSETRON HCL INJECTION 0.25 MG/5ML
0.2500 mg | Freq: Once | INTRAVENOUS | Status: AC
  Administered 2022-08-18: 0.25 mg via INTRAVENOUS
  Filled 2022-08-18: qty 5

## 2022-08-18 MED ORDER — SODIUM CHLORIDE 0.9 % IV SOLN: Freq: Once | INTRAVENOUS | Status: AC

## 2022-08-18 MED ORDER — SODIUM CHLORIDE 0.9 % IV SOLN
45.0000 mg/m2 | Freq: Once | INTRAVENOUS | Status: AC
  Administered 2022-08-18: 72 mg via INTRAVENOUS
  Filled 2022-08-18: qty 12

## 2022-08-18 MED ORDER — GADOBUTROL 1 MMOL/ML IV SOLN
5.5000 mL | Freq: Once | INTRAVENOUS | Status: AC | PRN
  Administered 2022-08-18: 5.5 mL via INTRAVENOUS

## 2022-08-18 NOTE — Progress Notes (Signed)
Thoracic Location of Tumor / Histology: Left Lung Cancer  Patient presented with persistent cough with blood tinged sputum.p  MRI Brain 08/18/2022:  PET:  Biopsies of Left Lingua 08/04/2022    Tobacco/Marijuana/Snuff/ETOH use:   Past/Anticipated interventions by cardiothoracic surgery, if any:   Past/Anticipated interventions by medical oncology, if any:  Dr. Julien Nordmann 08/11/2022 -explained to the patient that she is unlikely to be surgical candidate because of the central location of the tumor and this may require left pneumonectomy if she is a surgical candidate.  -I recommended for the patient a course of concurrent chemoradiation with weekly carboplatin for AUC of 2 and paclitaxel 45 Mg/M2 for 6/7 weeks followed by consolidation immunotherapy if the patient has no evidence for disease progression after the induction phase.  -Chemo start 08/18/2022   Signs/Symptoms Weight changes, if any:  Respiratory complaints, if any:  Hemoptysis, if any:  Pain issues, if any:    SAFETY ISSUES: Prior radiation?  Pacemaker/ICD?   Possible current pregnancy? N/a Is the patient on methotrexate?   Current Complaints / other details:

## 2022-08-18 NOTE — Patient Instructions (Signed)
[facility]  Discharge Instructions: Thank you for choosing [facility] to provide your oncology and hematology care.   If you have a lab appointment with the Cancer Center, please go directly to the Cancer Center and check in at the registration area.   Wear comfortable clothing and clothing appropriate for easy access to any Portacath or PICC line.   We strive to give you quality time with your provider. You may need to reschedule your appointment if you arrive late (15 or more minutes).  Arriving late affects you and other patients whose appointments are after yours.  Also, if you miss three or more appointments without notifying the office, you may be dismissed from the clinic at the provider's discretion.      For prescription refill requests, have your pharmacy contact our office and allow 72 hours for refills to be completed.    Today you received the following chemotherapy and/or immunotherapy agents: Paclitaxel and Carboplatin   To help prevent nausea and vomiting after your treatment, we encourage you to take your nausea medication as directed.  BELOW ARE SYMPTOMS THAT SHOULD BE REPORTED IMMEDIATELY: *FEVER GREATER THAN 100.4 F (38 C) OR HIGHER *CHILLS OR SWEATING *NAUSEA AND VOMITING THAT IS NOT CONTROLLED WITH YOUR NAUSEA MEDICATION *UNUSUAL SHORTNESS OF BREATH *UNUSUAL BRUISING OR BLEEDING *URINARY PROBLEMS (pain or burning when urinating, or frequent urination) *BOWEL PROBLEMS (unusual diarrhea, constipation, pain near the anus) TENDERNESS IN MOUTH AND THROAT WITH OR WITHOUT PRESENCE OF ULCERS (sore throat, sores in mouth, or a toothache) UNUSUAL RASH, SWELLING OR PAIN  UNUSUAL VAGINAL DISCHARGE OR ITCHING   Items with * indicate a potential emergency and should be followed up as soon as possible or go to the Emergency Department if any problems should occur.  Please show the CHEMOTHERAPY ALERT CARD or IMMUNOTHERAPY ALERT  CARD at check-in to the Emergency Department and triage nurse.  Should you have questions after your visit or need to cancel or reschedule your appointment, please contact [facility]  Dept: [phone]  and follow the prompts.  Office hours are 8:00 a.m. to 4:30 p.m. Monday - Friday. Please note that voicemails left after 4:00 p.m. may not be returned until the following business day.  We are closed weekends and major holidays. You have access to a nurse at all times for urgent questions. Please call the main number to the clinic Dept: [phone] and follow the prompts.   For any non-urgent questions, you may also contact your provider using MyChart. We now offer e-Visits for anyone 18 and older to request care online for non-urgent symptoms. For details visit [email or web address].   Also download the MyChart app! Go to the app store, search "MyChart", open the app, select , and log in with your MyChart username and password.  Paclitaxel Injection What is this medication? PACLITAXEL (PAK li TAX el) treats some types of cancer. It works by slowing down the growth of cancer cells. This medicine may be used for other purposes; ask your health care provider or pharmacist if you have questions. COMMON BRAND NAME(S): Onxol, Taxol What should I tell my care team before I take this medication? They need to know if you have any of these conditions: Heart disease Liver disease Low white blood cell levels An unusual or allergic reaction to paclitaxel, other medications, foods, dyes, or preservatives If you or your partner are pregnant or trying to get pregnant   Breast-feeding How should I use this medication? This medication is injected into a vein. It is given by your care team in a hospital or clinic setting. Talk to your care team about the use of this medication in children. While it may be given to children for selected conditions, precautions  do apply. Overdosage: If you think you have taken too much of this medicine contact a poison control center or emergency room at once. NOTE: This medicine is only for you. Do not share this medicine with others. What if I miss a dose? Keep appointments for follow-up doses. It is important not to miss your dose. Call your care team if you are unable to keep an appointment. What may interact with this medication? Do not take this medication with any of the following: Live virus vaccines Other medications may affect the way this medication works. Talk with your care team about all of the medications you take. They may suggest changes to your treatment plan to lower the risk of side effects and to make sure your medications work as intended. This list may not describe all possible interactions. Give your health care provider a list of all the medicines, herbs, non-prescription drugs, or dietary supplements you use. Also tell them if you smoke, drink alcohol, or use illegal drugs. Some items may interact with your medicine. What should I watch for while using this medication? Your condition will be monitored carefully while you are receiving this medication. You may need blood work while taking this medication. This medication may make you feel generally unwell. This is not uncommon as chemotherapy can affect healthy cells as well as cancer cells. Report any side effects. Continue your course of treatment even though you feel ill unless your care team tells you to stop. This medication can cause serious allergic reactions. To reduce the risk, your care team may give you other medications to take before receiving this one. Be sure to follow the directions from your care team. This medication may increase your risk of getting an infection. Call your care team for advice if you get a fever, chills, sore throat, or other symptoms of a cold or flu. Do not treat yourself. Try to avoid being around people who are  sick. This medication may increase your risk to bruise or bleed. Call your care team if you notice any unusual bleeding. Be careful brushing or flossing your teeth or using a toothpick because you may get an infection or bleed more easily. If you have any dental work done, tell your dentist you are receiving this medication. Talk to your care team if you may be pregnant. Serious birth defects can occur if you take this medication during pregnancy. Talk to your care team before breastfeeding. Changes to your treatment plan may be needed. What side effects may I notice from receiving this medication? Side effects that you should report to your care team as soon as possible: Allergic reactions--skin rash, itching, hives, swelling of the face, lips, tongue, or throat Heart rhythm changes--fast or irregular heartbeat, dizziness, feeling faint or lightheaded, chest pain, trouble breathing Increase in blood pressure Infection--fever, chills, cough, sore throat, wounds that don't heal, pain or trouble when passing urine, general feeling of discomfort or being unwell Low blood pressure--dizziness, feeling faint or lightheaded, blurry vision Low red blood cell level--unusual weakness or fatigue, dizziness, headache, trouble breathing Painful swelling, warmth, or redness of the skin, blisters or sores at the infusion site Pain, tingling, or numbness in the   hands or feet Slow heartbeat--dizziness, feeling faint or lightheaded, confusion, trouble breathing, unusual weakness or fatigue Unusual bruising or bleeding Side effects that usually do not require medical attention (report to your care team if they continue or are bothersome): Diarrhea Hair loss Joint pain Loss of appetite Muscle pain Nausea Vomiting This list may not describe all possible side effects. Call your doctor for medical advice about side effects. You may report side effects to FDA at 1-800-FDA-1088. Where should I keep my  medication? This medication is given in a hospital or clinic. It will not be stored at home. NOTE: This sheet is a summary. It may not cover all possible information. If you have questions about this medicine, talk to your doctor, pharmacist, or health care provider.  2023 Elsevier/Gold Standard (2021-10-10 00:00:00)  Carboplatin Injection What is this medication? CARBOPLATIN (KAR boe pla tin) treats some types of cancer. It works by slowing down the growth of cancer cells. This medicine may be used for other purposes; ask your health care provider or pharmacist if you have questions. COMMON BRAND NAME(S): Paraplatin What should I tell my care team before I take this medication? They need to know if you have any of these conditions: Blood disorders Hearing problems Kidney disease Recent or ongoing radiation therapy An unusual or allergic reaction to carboplatin, cisplatin, other medications, foods, dyes, or preservatives Pregnant or trying to get pregnant Breast-feeding How should I use this medication? This medication is injected into a vein. It is given by your care team in a hospital or clinic setting. Talk to your care team about the use of this medication in children. Special care may be needed. Overdosage: If you think you have taken too much of this medicine contact a poison control center or emergency room at once. NOTE: This medicine is only for you. Do not share this medicine with others. What if I miss a dose? Keep appointments for follow-up doses. It is important not to miss your dose. Call your care team if you are unable to keep an appointment. What may interact with this medication? Medications for seizures Some antibiotics, such as amikacin, gentamicin, neomycin, streptomycin, tobramycin Vaccines This list may not describe all possible interactions. Give your health care provider a list of all the medicines, herbs, non-prescription drugs, or dietary supplements you use.  Also tell them if you smoke, drink alcohol, or use illegal drugs. Some items may interact with your medicine. What should I watch for while using this medication? Your condition will be monitored carefully while you are receiving this medication. You may need blood work while taking this medication. This medication may make you feel generally unwell. This is not uncommon, as chemotherapy can affect healthy cells as well as cancer cells. Report any side effects. Continue your course of treatment even though you feel ill unless your care team tells you to stop. In some cases, you may be given additional medications to help with side effects. Follow all directions for their use. This medication may increase your risk of getting an infection. Call your care team for advice if you get a fever, chills, sore throat, or other symptoms of a cold or flu. Do not treat yourself. Try to avoid being around people who are sick. Avoid taking medications that contain aspirin, acetaminophen, ibuprofen, naproxen, or ketoprofen unless instructed by your care team. These medications may hide a fever. Be careful brushing or flossing your teeth or using a toothpick because you may get an infection or   bleed more easily. If you have any dental work done, tell your dentist you are receiving this medication. Talk to your care team if you wish to become pregnant or think you might be pregnant. This medication can cause serious birth defects. Talk to your care team about effective forms of contraception. Do not breast-feed while taking this medication. What side effects may I notice from receiving this medication? Side effects that you should report to your care team as soon as possible: Allergic reactions--skin rash, itching, hives, swelling of the face, lips, tongue, or throat Infection--fever, chills, cough, sore throat, wounds that don't heal, pain or trouble when passing urine, general feeling of discomfort or being  unwell Low red blood cell level--unusual weakness or fatigue, dizziness, headache, trouble breathing Pain, tingling, or numbness in the hands or feet, muscle weakness, change in vision, confusion or trouble speaking, loss of balance or coordination, trouble walking, seizures Unusual bruising or bleeding Side effects that usually do not require medical attention (report to your care team if they continue or are bothersome): Hair loss Nausea Unusual weakness or fatigue Vomiting This list may not describe all possible side effects. Call your doctor for medical advice about side effects. You may report side effects to FDA at 1-800-FDA-1088. Where should I keep my medication? This medication is given in a hospital or clinic. It will not be stored at home. NOTE: This sheet is a summary. It may not cover all possible information. If you have questions about this medicine, talk to your doctor, pharmacist, or health care provider.  2023 Elsevier/Gold Standard (2007-07-17 00:00:00)   

## 2022-08-18 NOTE — Progress Notes (Signed)
Radiation Oncology         [phone] ________________________________  Name: Evelyn Jordan        MRN: [id number]  Date of Service: 08/19/2022 DOB: 1951/02/29  CC:Leamon Arnt, MD  Curt Bears, MD     REFERRING PHYSICIAN: Curt Bears, MD   DIAGNOSIS: The primary encounter diagnosis was Malignant neoplasm of unspecified part of unspecified bronchus or lung ([place]). A diagnosis of Primary malignant neoplasm of left upper lobe of lung (H[facility]) was also pertinent to this visit.   HISTORY OF PRESENT ILLNESS: Evelyn Jordan is a 72 y.o. female seen at the request of Dr. Julien Nordmann for a newly diagnosed lung cancer. The patient had been out [place] travelling and hiking in [place]. She had shortness of breath and a cough and developed a single episode of hemoptysis. She had a CXR when she returned home on 07/22/22 that showed a mass like change in the left hilar region. A CT chest on 07/24/22 showed consolidation and collapse along the left linguar region with narrowing of the left lingular bronchus with air bronchogram concerning for an endobronchial lesion. There was no adenopathy. She had a bronchoscopy on 08/04/22 that shoed an endobronchial lesion impacting the LUL airways and LLL airways, and cytology showed squamous cell carcinoma. A PET scan at [facility] on 08/13/22 showed hypermetabolic left hilar mass obstructing the LUL bronchus with posterior obstructive pneumonitis about the lingula was noted and the hypermetabolic component measured 4.8 cm, and had an SUV of 16.1.  A low level of activity was nonspecific in the subcarinal region. No evidence of metastatic change was noted. She had an MRI of the brain yesterday, and the read is pending. She met with Dr. Julien Nordmann and he recommended chemoradiation. She started chemotherapy yesterday, and is seen today to consider radiotherapy.    PREVIOUS RADIATION THERAPY: No   PAST MEDICAL HISTORY:  Past Medical History:  Diagnosis Date    Depression    Insomnia    Osteoporosis    Pulmonary nodule    Thyroid nodule        PAST SURGICAL HISTORY: Past Surgical History:  Procedure Laterality Date   ANKLE SURGERY Right    BREAST BIOPSY Right    benign 2013   BRONCHIAL BIOPSY  08/04/2022   Procedure: BRONCHIAL BIOPSIES;  Surgeon: Collene Gobble, MD;  Location: [place];  Service: Cardiopulmonary;;   BRONCHIAL BRUSHINGS  08/04/2022   Procedure: BRONCHIAL BRUSHINGS;  Surgeon: Collene Gobble, MD;  Location: [facility] ENDOSCOPY;  Service: Cardiopulmonary;;   HEMOSTASIS CONTROL  08/04/2022   Procedure: HEMOSTASIS CONTROL;  Surgeon: Collene Gobble, MD;  Location: [facility] ENDOSCOPY;  Service: Cardiopulmonary;;   repair of a torn meniscus Left 05/2018   VIDEO BRONCHOSCOPY Left 08/04/2022   Procedure: VIDEO BRONCHOSCOPY WITH FLUORO;  Surgeon: Collene Gobble, MD;  Location: [place];  Service: Cardiopulmonary;  Laterality: Left;     FAMILY HISTORY:  Family History  Problem Relation Age of Onset   Alzheimer's disease Mother    Hypertension Father    Stroke Father    Breast cancer Neg Hx      SOCIAL HISTORY:  reports that she quit smoking about 12 years ago. Her smoking use included cigarettes. She started smoking about 53 years ago. She has a 40.00 pack-year smoking history. She has never used smokeless tobacco. She reports current alcohol use. She reports that she does not use drugs.   ALLERGIES: Compazine [prochlorperazine edisylate]   MEDICATIONS:  Current  Outpatient Medications  Medication Sig Dispense Refill   ALPRAZolam (XANAX) 0.5 MG tablet Take 1 tablet (0.5 mg total) by mouth at bedtime as needed for anxiety or sleep. 30 tablet 0   docusate sodium (COLACE) 100 MG capsule Take 100 mg by mouth every other day.     HYDROcodone bit-homatropine (HYCODAN) 5-1.5 MG/5ML syrup Take 5 mLs by mouth every 6 (six) hours as needed for cough. 120 mL 0   ondansetron (ZOFRAN) 8 MG tablet Take 1 tablet (8 mg total) by mouth every 8  (eight) hours as needed for nausea or vomiting. 20 tablet 0   No current facility-administered medications for this encounter.     REVIEW OF SYSTEMS: On review of systems, the patient reports that she is doing really well. She does have shortness of breath with exertion and prolonged talking. She has had a non productive cough intermittently without additional episodes of hemoptysis. She has not had unintentded weigh loss. No headaches or changes in speech, vision, or movement are noted.      PHYSICAL EXAM:  Wt Readings from Last 3 Encounters:  08/19/22 121 lb 9.6 oz (55.2 kg)  08/11/22 120 lb 12.8 oz (54.8 kg)  08/04/22 120 lb (54.4 kg)   Temp Readings from Last 3 Encounters:  08/19/22 (!) 97.5 F (36.4 C)  08/18/22 98.3 F (36.8 C) (Oral)  08/11/22 98.3 F (36.8 C) (Oral)   BP Readings from Last 3 Encounters:  08/19/22 (!) 145/76  08/18/22 128/71  08/11/22 135/79   Pulse Readings from Last 3 Encounters:  08/19/22 84  08/18/22 76  08/11/22 86   Pain Assessment Pain Score: 0-No pain/10  In general this is a well appearing caucasian female in no acute distress. She's alert and oriented x4 and appropriate throughout the examination. Cardiopulmonary assessment is negative for acute distress and she exhibits normal effort.     ECOG = 1  0 - Asymptomatic (Fully active, able to carry on all predisease activities without restriction)  1 - Symptomatic but completely ambulatory (Restricted in physically strenuous activity but ambulatory and able to carry out work of a light or sedentary nature. For example, light housework, office work)  2 - Symptomatic, <50% in bed during the day (Ambulatory and capable of all self care but unable to carry out any work activities. Up and about more than 50% of waking hours)  3 - Symptomatic, >50% in bed, but not bedbound (Capable of only limited self-care, confined to bed or chair 50% or more of waking hours)  4 - Bedbound (Completely  disabled. Cannot carry on any self-care. Totally confined to bed or chair)  5 - Death   Eustace Pen MM, Creech RH, Tormey DC, et al. [phone]). "Toxicity and response criteria of the [facility] [employer]". [place] Oncol. 5 (6): 649-55    LABORATORY DATA:  Lab Results  Component Value Date   WBC 7.7 08/18/2022   HGB 11.9 (L) 08/18/2022   HCT 36.5 08/18/2022   MCV 89.9 08/18/2022   PLT 344 08/18/2022   Lab Results  Component Value Date   NA 140 08/18/2022   K 3.5 08/18/2022   CL 105 08/18/2022   CO2 27 08/18/2022   Lab Results  Component Value Date   ALT 8 08/18/2022   AST 12 (L) 08/18/2022   ALKPHOS 64 08/18/2022   BILITOT 0.3 08/18/2022      RADIOGRAPHY: MM 3D SCREEN BREAST BILATERAL  Result Date: 08/04/2022 CLINICAL DATA:  Screening. EXAM: DIGITAL SCREENING  BILATERAL MAMMOGRAM WITH TOMOSYNTHESIS AND CAD TECHNIQUE: Bilateral screening digital craniocaudal and mediolateral oblique mammograms were obtained. Bilateral screening digital breast tomosynthesis was performed. The images were evaluated with computer-aided detection. COMPARISON:  Previous exam(s). ACR Breast Density Category b: There are scattered areas of fibroglandular density. FINDINGS: There are no findings suspicious for malignancy. IMPRESSION: No mammographic evidence of malignancy. A result letter of this screening mammogram will be mailed directly to the patient. RECOMMENDATION: Screening mammogram in one year. (Code:SM-B-01Y) BI-RADS CATEGORY  1: Negative. Electronically Signed   By: Lovey Newcomer M.D.   On: 08/04/2022 15:38   CT Chest W Contrast  Result Date: 07/24/2022 CLINICAL DATA:  Abnormal x-ray with lung nodule greater than 1 cm. Hemoptysis. EXAM: CT CHEST WITH CONTRAST TECHNIQUE: Multidetector CT imaging of the chest was performed during intravenous contrast administration. RADIATION DOSE REDUCTION: This exam was performed according to the departmental dose-optimization program which includes  automated exposure control, adjustment of the mA and/or kV according to patient size and/or use of iterative reconstruction technique. CONTRAST:  154m OMNIPAQUE IOHEXOL 300 MG/ML  SOLN COMPARISON:  Chest radiograph 07/22/2022.  CT 04/05/2020 FINDINGS: Cardiovascular: Normal heart size. Small pericardial effusion. Normal caliber thoracic aorta. No evidence of aortic dissection. Central pulmonary arteries are patent without evidence of significant pulmonary embolus. Calcification of the aorta and coronary arteries. Mediastinum/Nodes: Esophagus is decompressed. No significant lymphadenopathy. Thyroid gland is unremarkable. Lungs/Pleura: Consolidation and collapse of the left lingula, new since prior CT and corresponding to chest radiographic abnormalities. There is focal narrowing of the left lingular bronchus with air bronchograms present. This suggest a possible endobronchial lesion. Consider bronchoscopy for further evaluation. Multiple calcified granulomas are demonstrated in the lungs, unchanged since prior study. No pleural effusions. No pneumothorax. Upper Abdomen: Mild diffuse fatty infiltration of the liver. No acute abnormalities. Musculoskeletal: Postoperative changes in the anterior mandible. Degenerative changes in the spine. No destructive bone lesions. IMPRESSION: 1. Consolidation and collapse of the left lingula with focal narrowing of the left lingular bronchus and air bronchograms. Changes could indicate an endobronchial lesion. Consider bronchoscopy for further evaluation. 2. Scattered calcified granulomas are without change. 3. Fatty infiltration of the liver. 4. Normal heart size with small pericardial effusion. Electronically Signed   By: WLucienne CapersM.D.   On: 07/24/2022 20:07   DG Chest 2 View  Result Date: 07/24/2022 CLINICAL DATA:  Hemoptysis, cough. EXAM: CHEST - 2 VIEW COMPARISON:  CT chest 04/05/2020. FINDINGS: Trachea is midline. Heart size likely within normal limits. Left  perihilar masslike fullness with lingular collapse. Scattered calcified granulomas in the right lung. No pleural fluid. IMPRESSION: Masslike left perihilar opacification with lingular collapse, findings highly indicative of primary bronchogenic carcinoma. Recommend CT chest with contrast in further evaluation. These results will be called to the ordering clinician or representative by the Radiologist Assistant, and communication documented in the PACS or [employer] Electronically Signed   By: MLorin PicketM.D.   On: 07/24/2022 13:44       IMPRESSION/PLAN: 1. 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 Dr. MLisbeth Renshawdiscusses the pathology findings and imaging findings and given the uncertainty of the subcarinal node station, Dr. MLisbeth Renshawfavors classifying this as N2 disease and covering this area with treatment after he reviewed the nature of locally advanced disease and the rationale for chemoradiation.  We discussed the risks, benefits, short, and long term effects of radiotherapy, as well as the curative intent, and the patient is interested in proceeding. Dr. MLisbeth Renshaw  discusses the delivery and logistics of radiotherapy and anticipates a course of 6 1/2 weeks of radiotherapy to the chest and regional nodes. Written consent is obtained and placed in the chart, a copy was provided to the patient. She will simulate today and begin treatment next week with her 2nd week of chemotherapy.    In a visit lasting 60 minutes, greater than 50% of the time was spent face to face discussing the patient's condition, in preparation for the discussion, and coordinating the patient's care.   The above documentation reflects my direct findings during this shared patient visit. Please see the separate note by Dr. Lisbeth Renshaw on this date for the remainder of the patient's plan of care.    Carola Rhine, [facility]   **Disclaimer: This note was dictated with voice recognition software. Similar  sounding words can inadvertently be transcribed and this note may contain transcription errors which may not have been corrected upon publication of note.**

## 2022-08-19 ENCOUNTER — Ambulatory Visit
Admission: RE | Admit: 2022-08-19 | Discharge: 2022-08-19 | Disposition: A | Discharge status: Home / Self Care | Payer: Medicare Other | Source: Ambulatory Visit | Attending: Radiation Oncology | Admitting: Radiation Oncology

## 2022-08-19 ENCOUNTER — Other Ambulatory Visit: Payer: Self-pay

## 2022-08-19 ENCOUNTER — Encounter: Payer: Self-pay | Admitting: Radiation Oncology

## 2022-08-19 ENCOUNTER — Telehealth: Payer: Self-pay

## 2022-08-19 ENCOUNTER — Encounter (HOSPITAL_COMMUNITY): Payer: Self-pay | Admitting: Internal Medicine

## 2022-08-19 VITALS — BP 145/76 | HR 84 | Temp 97.5°F | Resp 18 | Ht 63.0 in | Wt 121.6 lb

## 2022-08-19 DIAGNOSIS — M810 Age-related osteoporosis without current pathological fracture: Secondary | ICD-10-CM | POA: Insufficient documentation

## 2022-08-19 DIAGNOSIS — Z5111 Encounter for antineoplastic chemotherapy: Secondary | ICD-10-CM | POA: Diagnosis not present

## 2022-08-19 DIAGNOSIS — J984 Other disorders of lung: Secondary | ICD-10-CM | POA: Diagnosis not present

## 2022-08-19 DIAGNOSIS — C3412 Malignant neoplasm of upper lobe, left bronchus or lung: Secondary | ICD-10-CM

## 2022-08-19 DIAGNOSIS — Z79899 Other long term (current) drug therapy: Secondary | ICD-10-CM | POA: Insufficient documentation

## 2022-08-19 DIAGNOSIS — Z87891 Personal history of nicotine dependence: Secondary | ICD-10-CM | POA: Insufficient documentation

## 2022-08-19 DIAGNOSIS — C3490 Malignant neoplasm of unspecified part of unspecified bronchus or lung: Secondary | ICD-10-CM

## 2022-08-19 DIAGNOSIS — Z510 Encounter for antineoplastic radiation therapy: Secondary | ICD-10-CM | POA: Diagnosis not present

## 2022-08-19 NOTE — Telephone Encounter (Signed)
LM for patient that this nurse was calling to see how they were doing after their treatment. Please call back to Dr. Mohamed's nurse at [phone] if they have any questions or concerns regarding the treatment.  

## 2022-08-19 NOTE — Telephone Encounter (Signed)
-----   Message from Charleston Poot, RN sent at 08/18/2022 12:50 PM EDT ----- Regarding: first time/ Paclitaxel and Carboplatin/ Dr Julien Nordmann pt Hello,  Pt had first time Paclitaxel and Carboplatin today. Tolerated well.  Thanks!

## 2022-08-19 NOTE — Telephone Encounter (Signed)
Regarding: first time/ Paclitaxel and Carboplatin/   This nurse received a message from this patient stating that she has not had any adverse reactions.  States the Prednisone has her feeling pretty good today.  No further questions or concerns noted.  Patient knows to call clinic if there are any symptoms that may occur.

## 2022-08-20 ENCOUNTER — Encounter: Payer: Self-pay | Admitting: Medical Oncology

## 2022-08-20 ENCOUNTER — Encounter: Payer: Self-pay | Admitting: Internal Medicine

## 2022-08-20 ENCOUNTER — Encounter (HOSPITAL_COMMUNITY): Payer: Medicare Other

## 2022-08-21 ENCOUNTER — Other Ambulatory Visit: Payer: Self-pay | Admitting: Internal Medicine

## 2022-08-21 ENCOUNTER — Telehealth: Payer: Self-pay | Admitting: Emergency Medicine

## 2022-08-21 ENCOUNTER — Ambulatory Visit: Payer: Medicare Other | Admitting: Radiation Oncology

## 2022-08-21 NOTE — Telephone Encounter (Signed)
Patient would like to know if she needs appointment on 09/04/2022 with Dr, Lamonte Sakai. Patient phone number is [phone].

## 2022-08-21 NOTE — Progress Notes (Signed)
Navigator called pt to see how she was feeling after her first chemo treatment that took place on Monday 3/11. Pt endorses feeling tired, otherwise fine. Pt as in the car with her husband [place] before her Radiation starts on Monday 3/18. Pt had no other concerns or questions at this time. Navigator will meet with the pt on Monday to assess any additional needs.

## 2022-08-22 ENCOUNTER — Ambulatory Visit: Payer: Medicare Other

## 2022-08-22 ENCOUNTER — Other Ambulatory Visit: Payer: Self-pay | Admitting: Internal Medicine

## 2022-08-22 ENCOUNTER — Other Ambulatory Visit: Payer: Self-pay | Admitting: Physician Assistant

## 2022-08-22 DIAGNOSIS — Z5111 Encounter for antineoplastic chemotherapy: Secondary | ICD-10-CM | POA: Diagnosis not present

## 2022-08-22 DIAGNOSIS — C3490 Malignant neoplasm of unspecified part of unspecified bronchus or lung: Secondary | ICD-10-CM

## 2022-08-22 DIAGNOSIS — C3491 Malignant neoplasm of unspecified part of right bronchus or lung: Secondary | ICD-10-CM | POA: Diagnosis not present

## 2022-08-22 DIAGNOSIS — C3412 Malignant neoplasm of upper lobe, left bronchus or lung: Secondary | ICD-10-CM | POA: Diagnosis not present

## 2022-08-22 DIAGNOSIS — Z87891 Personal history of nicotine dependence: Secondary | ICD-10-CM | POA: Diagnosis not present

## 2022-08-22 DIAGNOSIS — Z510 Encounter for antineoplastic radiation therapy: Secondary | ICD-10-CM | POA: Diagnosis not present

## 2022-08-22 MED ORDER — HYDROCODONE BIT-HOMATROP MBR 5-1.5 MG/5ML PO SOLN: 5.0000 mL | Freq: Four times a day (QID) | ORAL | 0 refills | Status: DC | PRN

## 2022-08-22 MED FILL — Dexamethasone Sodium Phosphate Inj 100 MG/10ML: INTRAMUSCULAR | Qty: 1 | Status: AC

## 2022-08-22 NOTE — Telephone Encounter (Signed)
Spoke with the pt  She is scheduled for appt with Dr Lamonte Sakai post bronch on 09/04/22  She has been seen by Dr Julien Nordmann and plans to get chemo  He suggested that Dr Lamonte Sakai may want to wait until she completes her tx to come back here and see him   Please advise, thanks!

## 2022-08-23 NOTE — Progress Notes (Unsigned)
[place] OFFICE PROGRESS NOTE  [address], [place] [address]  DIAGNOSIS: Stage IIIb (T3, N2, M0) non-small cell lung cancer, squamous cell carcinoma presented with obstructive left lingular mass with consolidation and collapse of the lingula as well as part of the left upper lobe and likely invasion of the mediastinum diagnosed in February 2024. ***  PDL1: 5%  PRIOR THERAPY: None  CURRENT THERAPY: Concurrent chemoradiation with weekly carboplatin for AUC of 2 and paclitaxel 45 Mg/M2. Status post 1 week of treatment.   INTERVAL HISTORY: EIZA TESKA 72 y.o. female returns to the clinic today for follow-up visit.  The patient was recently diagnosed with stage III lung cancer.  She underwent her first cycle of concurrent chemoradiation last week and she tolerated well without any concerning adverse side effects except for fatigue.  She is scheduled to start radiation today on 08/25/2022. Her last day of radiation is scheduled for 10/08/22. She underwent her staging brain MRI which was negative for metastatic disease to the brain.  She also underwent her staging PET scan at [facility] which ***.  Today she denies any fever, chills, night sweats, or unexplained weight loss.  She continues to have a dry cough and dyspnea on exertion.  Denies any chest pain or hemoptysis.  Nausea?  Denies any vomiting, diarrhea, or constipation.  Denies any headache or visual changes.  She is here today for evaluation repeat blood work before undergoing week 2.  MEDICAL HISTORY: Past Medical History:  Diagnosis Date   Depression    Insomnia    Osteoporosis    Pulmonary nodule    Thyroid nodule     ALLERGIES:  is allergic to compazine [prochlorperazine edisylate].  MEDICATIONS:  Current Outpatient Medications  Medication Sig Dispense Refill   ALPRAZolam (XANAX) 0.5 MG tablet Take 1 tablet (0.5 mg total) by mouth at bedtime as needed for anxiety or sleep. 30  tablet 0   docusate sodium (COLACE) 100 MG capsule Take 100 mg by mouth every other day.     HYDROcodone bit-homatropine (HYCODAN) 5-1.5 MG/5ML syrup Take 5 mLs by mouth every 6 (six) hours as needed for cough. 120 mL 0   ondansetron (ZOFRAN) 8 MG tablet Take 1 tablet (8 mg total) by mouth every 8 (eight) hours as needed for nausea or vomiting. 20 tablet 0   No current facility-administered medications for this visit.    SURGICAL HISTORY:  Past Surgical History:  Procedure Laterality Date   ANKLE SURGERY Right    BREAST BIOPSY Right    benign 2013   BRONCHIAL BIOPSY  08/04/2022   Procedure: BRONCHIAL BIOPSIES;  Surgeon: Collene Gobble, MD;  Location: [facility];  Service: Cardiopulmonary;;   BRONCHIAL BRUSHINGS  08/04/2022   Procedure: BRONCHIAL BRUSHINGS;  Surgeon: Collene Gobble, MD;  Location: [facility] ENDOSCOPY;  Service: Cardiopulmonary;;   HEMOSTASIS CONTROL  08/04/2022   Procedure: HEMOSTASIS CONTROL;  Surgeon: Collene Gobble, MD;  Location: [facility] ENDOSCOPY;  Service: Cardiopulmonary;;   repair of a torn meniscus Left 05/2018   VIDEO BRONCHOSCOPY Left 08/04/2022   Procedure: VIDEO BRONCHOSCOPY WITH FLUORO;  Surgeon: Collene Gobble, MD;  Location: [facility];  Service: Cardiopulmonary;  Laterality: Left;    REVIEW OF SYSTEMS:   Review of Systems  Constitutional: Negative for appetite change, chills, fatigue, fever and unexpected weight change.  HENT:   Negative for mouth sores, nosebleeds, sore throat and trouble swallowing.   Eyes: Negative for eye problems  and icterus.  Respiratory: Negative for cough, hemoptysis, shortness of breath and wheezing.   Cardiovascular: Negative for chest pain and leg swelling.  Gastrointestinal: Negative for abdominal pain, constipation, diarrhea, nausea and vomiting.  Genitourinary: Negative for bladder incontinence, difficulty urinating, dysuria, frequency and hematuria.   Musculoskeletal: Negative for back pain, gait problem, neck pain and neck  stiffness.  Skin: Negative for itching and rash.  Neurological: Negative for dizziness, extremity weakness, gait problem, headaches, light-headedness and seizures.  Hematological: Negative for adenopathy. Does not bruise/bleed easily.  Psychiatric/Behavioral: Negative for confusion, depression and sleep disturbance. The patient is not nervous/anxious.     PHYSICAL EXAMINATION:  There were no vitals taken for this visit.  ECOG PERFORMANCE STATUS: {CHL ONC ECOG [id number]  Physical Exam  Constitutional: Oriented to person, place, and time and well-developed, well-nourished, and in no distress. No distress.  HENT:  Head: Normocephalic and atraumatic.  Mouth/Throat: Oropharynx is clear and moist. No oropharyngeal exudate.  Eyes: Conjunctivae are normal. Right eye exhibits no discharge. Left eye exhibits no discharge. No scleral icterus.  Neck: Normal range of motion. Neck supple.  Cardiovascular: Normal rate, regular rhythm, normal heart sounds and intact distal pulses.   Pulmonary/Chest: Effort normal and breath sounds normal. No respiratory distress. No wheezes. No rales.  Abdominal: Soft. Bowel sounds are normal. Exhibits no distension and no mass. There is no tenderness.  Musculoskeletal: Normal range of motion. Exhibits no edema.  Lymphadenopathy:    No cervical adenopathy.  Neurological: Alert and oriented to person, place, and time. Exhibits normal muscle tone. Gait normal. Coordination normal.  Skin: Skin is warm and dry. No rash noted. Not diaphoretic. No erythema. No pallor.  Psychiatric: Mood, memory and judgment normal.  Vitals reviewed.  LABORATORY DATA: Lab Results  Component Value Date   WBC 7.7 08/18/2022   HGB 11.9 (L) 08/18/2022   HCT 36.5 08/18/2022   MCV 89.9 08/18/2022   PLT 344 08/18/2022      Chemistry      Component Value Date/Time   NA 140 08/18/2022 0810   NA 141 03/07/2015 0000   K 3.5 08/18/2022 0810   CL 105 08/18/2022 0810   CO2 27  08/18/2022 0810   BUN 11 08/18/2022 0810   CREATININE 0.89 08/18/2022 0810   GLU 103 03/07/2015 0000      Component Value Date/Time   CALCIUM 9.0 08/18/2022 0810   ALKPHOS 64 08/18/2022 0810   AST 12 (L) 08/18/2022 0810   ALT 8 08/18/2022 0810   BILITOT 0.3 08/18/2022 0810       RADIOGRAPHIC STUDIES:  MR BRAIN W WO CONTRAST  Result Date: 08/20/2022 CLINICAL DATA:  Non-small cell lung cancer.  Staging.  Intra-axial EXAM: MRI HEAD WITHOUT AND WITH CONTRAST TECHNIQUE: Multiplanar, multiecho pulse sequences of the brain and surrounding structures were obtained without and with intravenous contrast. CONTRAST:  5.79mL GADAVIST GADOBUTROL 1 MMOL/ML IV SOLN COMPARISON:  None Available. FINDINGS: Brain: The brain has a normal appearance without evidence of malformation, atrophy, old or acute small or large vessel infarction, mass lesion, hemorrhage, hydrocephalus or extra-axial collection. Incidental small meningioma at the right lateral frontal convexity with a diameter of 11 mm and maximal thickness of 5 mm. No mass-effect upon the brain. This should not be of clinical relevance. Vascular: Major vessels at the base of the brain show flow. Venous sinuses appear patent. Skull and upper cervical spine: Normal. Sinuses/Orbits: Clear/normal. Other: None significant. IMPRESSION: 1. Normal appearance of the brain itself. No evidence of metastatic  disease. 2. Incidental small meningioma at the right lateral frontal convexity measuring 11 x 5 mm. No mass-effect upon the brain. This should not be of clinical relevance. Electronically Signed   By: Nelson Chimes M.D.   On: 08/20/2022 11:17   MM 3D SCREEN BREAST BILATERAL  Result Date: 08/04/2022 CLINICAL DATA:  Screening. EXAM: DIGITAL SCREENING BILATERAL MAMMOGRAM WITH TOMOSYNTHESIS AND CAD TECHNIQUE: Bilateral screening digital craniocaudal and mediolateral oblique mammograms were obtained. Bilateral screening digital breast tomosynthesis was performed. The  images were evaluated with computer-aided detection. COMPARISON:  Previous exam(s). ACR Breast Density Category b: There are scattered areas of fibroglandular density. FINDINGS: There are no findings suspicious for malignancy. IMPRESSION: No mammographic evidence of malignancy. A result letter of this screening mammogram will be mailed directly to the patient. RECOMMENDATION: Screening mammogram in one year. (Code:SM-B-01Y) BI-RADS CATEGORY  1: Negative. Electronically Signed   By: Lovey Newcomer M.D.   On: 08/04/2022 15:38   CT Chest W Contrast  Result Date: 07/24/2022 CLINICAL DATA:  Abnormal x-ray with lung nodule greater than 1 cm. Hemoptysis. EXAM: CT CHEST WITH CONTRAST TECHNIQUE: Multidetector CT imaging of the chest was performed during intravenous contrast administration. RADIATION DOSE REDUCTION: This exam was performed according to the departmental dose-optimization program which includes automated exposure control, adjustment of the mA and/or kV according to patient size and/or use of iterative reconstruction technique. CONTRAST:  158mL OMNIPAQUE IOHEXOL 300 MG/ML  SOLN COMPARISON:  Chest radiograph 07/22/2022.  CT 04/05/2020 FINDINGS: Cardiovascular: Normal heart size. Small pericardial effusion. Normal caliber thoracic aorta. No evidence of aortic dissection. Central pulmonary arteries are patent without evidence of significant pulmonary embolus. Calcification of the aorta and coronary arteries. Mediastinum/Nodes: Esophagus is decompressed. No significant lymphadenopathy. Thyroid gland is unremarkable. Lungs/Pleura: Consolidation and collapse of the left lingula, new since prior CT and corresponding to chest radiographic abnormalities. There is focal narrowing of the left lingular bronchus with air bronchograms present. This suggest a possible endobronchial lesion. Consider bronchoscopy for further evaluation. Multiple calcified granulomas are demonstrated in the lungs, unchanged since prior study. No  pleural effusions. No pneumothorax. Upper Abdomen: Mild diffuse fatty infiltration of the liver. No acute abnormalities. Musculoskeletal: Postoperative changes in the anterior mandible. Degenerative changes in the spine. No destructive bone lesions. IMPRESSION: 1. Consolidation and collapse of the left lingula with focal narrowing of the left lingular bronchus and air bronchograms. Changes could indicate an endobronchial lesion. Consider bronchoscopy for further evaluation. 2. Scattered calcified granulomas are without change. 3. Fatty infiltration of the liver. 4. Normal heart size with small pericardial effusion. Electronically Signed   By: Lucienne Capers M.D.   On: 07/24/2022 20:07     ASSESSMENT/PLAN:  This is a very pleasant 72 years old white female recently diagnosed with a stage IIIb (T3, N2, M0) non-small cell lung cancer, squamous cell carcinoma presented with obstructive left lingular mass with consolidation and collapse of the lingula as well as part of the left upper lobe and likely invasion of the mediastinum diagnosed in February 2024. ***PD-L1 expression 5%  The patient is currently undergoing weekly concurrent chemoradiation with carboplatin for an AUC of 2 and paclitaxel 45 mg/m.  The patient is status post 1 week of treatment and she tolerated well except for fatigue.  The patient was seen with Dr. Julien Nordmann today.  Dr. Burr Medico reviewed her brain MRI and PET scan.  Recommend she continue on the same treatment at the same dose.  We will see her back for follow-up visit in 2  weeks for evaluation repeat blood work before undergoing cycle #4.  The patient was advised to call immediately if she has any concerning symptoms in the interval. The patient voices understanding of current disease status and treatment options and is in agreement with the current care plan. All questions were answered. The patient knows to call the clinic with any problems, questions or concerns. We can certainly  see the patient much sooner if necessary    No orders of the defined types were placed in this encounter.    I spent {CHL ONC TIME VISIT - [id number] counseling the patient face to face. The total time spent in the appointment was {CHL ONC TIME VISIT - [id number].  Ruth Tully L Makiyla Linch, PA-C 08/23/22

## 2022-08-24 ENCOUNTER — Other Ambulatory Visit: Payer: Self-pay | Admitting: Family Medicine

## 2022-08-25 ENCOUNTER — Other Ambulatory Visit: Payer: Self-pay

## 2022-08-25 ENCOUNTER — Other Ambulatory Visit: Payer: Self-pay | Admitting: Physician Assistant

## 2022-08-25 ENCOUNTER — Inpatient Hospital Stay (HOSPITAL_BASED_OUTPATIENT_CLINIC_OR_DEPARTMENT_OTHER): Payer: Medicare Other | Admitting: Physician Assistant

## 2022-08-25 ENCOUNTER — Ambulatory Visit
Admission: RE | Admit: 2022-08-25 | Discharge: 2022-08-25 | Disposition: A | Discharge status: Home / Self Care | Payer: Medicare Other | Source: Ambulatory Visit | Attending: Radiation Oncology | Admitting: Radiation Oncology

## 2022-08-25 ENCOUNTER — Inpatient Hospital Stay: Payer: Medicare Other

## 2022-08-25 ENCOUNTER — Encounter: Payer: Self-pay | Admitting: Emergency Medicine

## 2022-08-25 VITALS — BP 135/78 | HR 80 | Temp 98.0°F | Resp 13 | Wt 121.7 lb

## 2022-08-25 DIAGNOSIS — Z5111 Encounter for antineoplastic chemotherapy: Secondary | ICD-10-CM | POA: Diagnosis not present

## 2022-08-25 DIAGNOSIS — C3412 Malignant neoplasm of upper lobe, left bronchus or lung: Secondary | ICD-10-CM | POA: Diagnosis not present

## 2022-08-25 DIAGNOSIS — C3490 Malignant neoplasm of unspecified part of unspecified bronchus or lung: Secondary | ICD-10-CM

## 2022-08-25 DIAGNOSIS — Z510 Encounter for antineoplastic radiation therapy: Secondary | ICD-10-CM | POA: Diagnosis not present

## 2022-08-25 DIAGNOSIS — Z87891 Personal history of nicotine dependence: Secondary | ICD-10-CM | POA: Diagnosis not present

## 2022-08-25 LAB — RAD ONC ARIA SESSION SUMMARY
Course Elapsed Days: 0
Plan Fractions Treated to Date: 1
Plan Prescribed Dose Per Fraction: 2 Gy
Plan Total Fractions Prescribed: 30
Plan Total Prescribed Dose: 60 Gy
Reference Point Dosage Given to Date: 2 Gy
Reference Point Session Dosage Given: 2 Gy
Session Number: 1

## 2022-08-25 LAB — CMP (CANCER CENTER ONLY)
ALT: 9 U/L (ref 0–44)
AST: 11 U/L — ABNORMAL LOW (ref 15–41)
Albumin: 3.6 g/dL (ref 3.5–5.0)
Alkaline Phosphatase: 69 U/L (ref 38–126)
Anion gap: 7 (ref 5–15)
BUN: 14 mg/dL (ref 8–23)
CO2: 28 mmol/L (ref 22–32)
Calcium: 9.1 mg/dL (ref 8.9–10.3)
Chloride: 103 mmol/L (ref 98–111)
Creatinine: 0.82 mg/dL (ref 0.44–1.00)
GFR, Estimated: >60 mL/min (ref >60)
Glucose, Bld: 89 mg/dL (ref 70–99)
Potassium: 4 mmol/L (ref 3.5–5.1)
Sodium: 138 mmol/L (ref 135–145)
Total Bilirubin: 0.3 mg/dL (ref 0.3–1.2)
Total Protein: 7.1 g/dL (ref 6.5–8.1)

## 2022-08-25 LAB — CBC WITH DIFFERENTIAL (CANCER CENTER ONLY)
Abs Immature Granulocytes: 0.03 10*3/uL (ref 0.00–0.07)
Basophils Absolute: 0 10*3/uL (ref 0.0–0.1)
Basophils Relative: 0 %
Eosinophils Absolute: 0.1 10*3/uL (ref 0.0–0.5)
Eosinophils Relative: 1 %
HCT: 36.7 % (ref 36.0–46.0)
Hemoglobin: 12 g/dL (ref 12.0–15.0)
Immature Granulocytes: 0 %
Lymphocytes Relative: 9 %
Lymphs Abs: 0.6 10*3/uL — ABNORMAL LOW (ref 0.7–4.0)
MCH: 29.3 pg (ref 26.0–34.0)
MCHC: 32.7 g/dL (ref 30.0–36.0)
MCV: 89.5 fL (ref 80.0–100.0)
Monocytes Absolute: 0.6 10*3/uL (ref 0.1–1.0)
Monocytes Relative: 8 %
Neutro Abs: 6.2 10*3/uL (ref 1.7–7.7)
Neutrophils Relative %: 82 %
Platelet Count: 390 10*3/uL (ref 150–400)
RBC: 4.1 MIL/uL (ref 3.87–5.11)
RDW: 13.2 % (ref 11.5–15.5)
WBC Count: 7.5 10*3/uL (ref 4.0–10.5)
nRBC: 0 % (ref 0.0–0.2)

## 2022-08-25 MED ORDER — LIDOCAINE-PRILOCAINE 2.5-2.5 % EX CREA: 1.0000 | TOPICAL_CREAM | CUTANEOUS | 2 refills | Status: DC | PRN

## 2022-08-25 MED ORDER — PALONOSETRON HCL INJECTION 0.25 MG/5ML
0.2500 mg | Freq: Once | INTRAVENOUS | Status: AC
  Administered 2022-08-25: 0.25 mg via INTRAVENOUS
  Filled 2022-08-25: qty 5

## 2022-08-25 MED ORDER — DIPHENHYDRAMINE HCL 50 MG/ML IJ SOLN
50.0000 mg | Freq: Once | INTRAMUSCULAR | Status: AC
  Administered 2022-08-25: 50 mg via INTRAVENOUS
  Filled 2022-08-25: qty 1

## 2022-08-25 MED ORDER — SODIUM CHLORIDE 0.9 % IV SOLN
139.2000 mg | Freq: Once | INTRAVENOUS | Status: AC
  Administered 2022-08-25: 140 mg via INTRAVENOUS
  Filled 2022-08-25: qty 14

## 2022-08-25 MED ORDER — SODIUM CHLORIDE 0.9 % IV SOLN
10.0000 mg | Freq: Once | INTRAVENOUS | Status: AC
  Administered 2022-08-25: 10 mg via INTRAVENOUS
  Filled 2022-08-25: qty 10

## 2022-08-25 MED ORDER — FAMOTIDINE IN NACL 20-0.9 MG/50ML-% IV SOLN
20.0000 mg | Freq: Once | INTRAVENOUS | Status: AC
  Administered 2022-08-25: 20 mg via INTRAVENOUS
  Filled 2022-08-25: qty 50

## 2022-08-25 MED ORDER — SODIUM CHLORIDE 0.9 % IV SOLN: Freq: Once | INTRAVENOUS | Status: AC

## 2022-08-25 MED ORDER — SODIUM CHLORIDE 0.9 % IV SOLN
45.0000 mg/m2 | Freq: Once | INTRAVENOUS | Status: AC
  Administered 2022-08-25: 72 mg via INTRAVENOUS
  Filled 2022-08-25: qty 12

## 2022-08-25 MED ORDER — ALPRAZOLAM 0.5 MG PO TABS: 0.5000 mg | ORAL_TABLET | Freq: Every evening | ORAL | 5 refills | Status: DC | PRN

## 2022-08-25 NOTE — Telephone Encounter (Signed)
Attempted to call pt but unable to reach. Left message to return call.  

## 2022-08-25 NOTE — Patient Instructions (Signed)
[facility]  Discharge Instructions: Thank you for choosing [facility] to provide your oncology and hematology care.   If you have a lab appointment with the Cancer Center, please go directly to the Cancer Center and check in at the registration area.   Wear comfortable clothing and clothing appropriate for easy access to any Portacath or PICC line.   We strive to give you quality time with your provider. You may need to reschedule your appointment if you arrive late (15 or more minutes).  Arriving late affects you and other patients whose appointments are after yours.  Also, if you miss three or more appointments without notifying the office, you may be dismissed from the clinic at the provider's discretion.      For prescription refill requests, have your pharmacy contact our office and allow 72 hours for refills to be completed.    Today you received the following chemotherapy and/or immunotherapy agents: Paclitaxel and Carboplatin   To help prevent nausea and vomiting after your treatment, we encourage you to take your nausea medication as directed.  BELOW ARE SYMPTOMS THAT SHOULD BE REPORTED IMMEDIATELY: *FEVER GREATER THAN 100.4 F (38 C) OR HIGHER *CHILLS OR SWEATING *NAUSEA AND VOMITING THAT IS NOT CONTROLLED WITH YOUR NAUSEA MEDICATION *UNUSUAL SHORTNESS OF BREATH *UNUSUAL BRUISING OR BLEEDING *URINARY PROBLEMS (pain or burning when urinating, or frequent urination) *BOWEL PROBLEMS (unusual diarrhea, constipation, pain near the anus) TENDERNESS IN MOUTH AND THROAT WITH OR WITHOUT PRESENCE OF ULCERS (sore throat, sores in mouth, or a toothache) UNUSUAL RASH, SWELLING OR PAIN  UNUSUAL VAGINAL DISCHARGE OR ITCHING   Items with * indicate a potential emergency and should be followed up as soon as possible or go to the Emergency Department if any problems should occur.  Please show the CHEMOTHERAPY ALERT CARD or IMMUNOTHERAPY ALERT  CARD at check-in to the Emergency Department and triage nurse.  Should you have questions after your visit or need to cancel or reschedule your appointment, please contact [facility]  Dept: [phone]  and follow the prompts.  Office hours are 8:00 a.m. to 4:30 p.m. Monday - Friday. Please note that voicemails left after 4:00 p.m. may not be returned until the following business day.  We are closed weekends and major holidays. You have access to a nurse at all times for urgent questions. Please call the main number to the clinic Dept: [phone] and follow the prompts.   For any non-urgent questions, you may also contact your provider using MyChart. We now offer e-Visits for anyone 18 and older to request care online for non-urgent symptoms. For details visit [email or web address].   Also download the MyChart app! Go to the app store, search "MyChart", open the app, select [facility], and log in with your MyChart username and password.  Paclitaxel Injection What is this medication? PACLITAXEL (PAK li TAX el) treats some types of cancer. It works by slowing down the growth of cancer cells. This medicine may be used for other purposes; ask your health care provider or pharmacist if you have questions. COMMON BRAND NAME(S): Onxol, Taxol What should I tell my care team before I take this medication? They need to know if you have any of these conditions: Heart disease Liver disease Low white blood cell levels An unusual or allergic reaction to paclitaxel, other medications, foods, dyes, or preservatives If you or your partner are pregnant or trying to get pregnant   Breast-feeding How should I use this medication? This medication is injected into a vein. It is given by your care team in a hospital or clinic setting. Talk to your care team about the use of this medication in children. While it may be given to children for selected conditions, precautions  do apply. Overdosage: If you think you have taken too much of this medicine contact a poison control center or emergency room at once. NOTE: This medicine is only for you. Do not share this medicine with others. What if I miss a dose? Keep appointments for follow-up doses. It is important not to miss your dose. Call your care team if you are unable to keep an appointment. What may interact with this medication? Do not take this medication with any of the following: Live virus vaccines Other medications may affect the way this medication works. Talk with your care team about all of the medications you take. They may suggest changes to your treatment plan to lower the risk of side effects and to make sure your medications work as intended. This list may not describe all possible interactions. Give your health care provider a list of all the medicines, herbs, non-prescription drugs, or dietary supplements you use. Also tell them if you smoke, drink alcohol, or use illegal drugs. Some items may interact with your medicine. What should I watch for while using this medication? Your condition will be monitored carefully while you are receiving this medication. You may need blood work while taking this medication. This medication may make you feel generally unwell. This is not uncommon as chemotherapy can affect healthy cells as well as cancer cells. Report any side effects. Continue your course of treatment even though you feel ill unless your care team tells you to stop. This medication can cause serious allergic reactions. To reduce the risk, your care team may give you other medications to take before receiving this one. Be sure to follow the directions from your care team. This medication may increase your risk of getting an infection. Call your care team for advice if you get a fever, chills, sore throat, or other symptoms of a cold or flu. Do not treat yourself. Try to avoid being around people who are  sick. This medication may increase your risk to bruise or bleed. Call your care team if you notice any unusual bleeding. Be careful brushing or flossing your teeth or using a toothpick because you may get an infection or bleed more easily. If you have any dental work done, tell your dentist you are receiving this medication. Talk to your care team if you may be pregnant. Serious birth defects can occur if you take this medication during pregnancy. Talk to your care team before breastfeeding. Changes to your treatment plan may be needed. What side effects may I notice from receiving this medication? Side effects that you should report to your care team as soon as possible: Allergic reactions--skin rash, itching, hives, swelling of the face, lips, tongue, or throat Heart rhythm changes--fast or irregular heartbeat, dizziness, feeling faint or lightheaded, chest pain, trouble breathing Increase in blood pressure Infection--fever, chills, cough, sore throat, wounds that don't heal, pain or trouble when passing urine, general feeling of discomfort or being unwell Low blood pressure--dizziness, feeling faint or lightheaded, blurry vision Low red blood cell level--unusual weakness or fatigue, dizziness, headache, trouble breathing Painful swelling, warmth, or redness of the skin, blisters or sores at the infusion site Pain, tingling, or numbness in the   hands or feet Slow heartbeat--dizziness, feeling faint or lightheaded, confusion, trouble breathing, unusual weakness or fatigue Unusual bruising or bleeding Side effects that usually do not require medical attention (report to your care team if they continue or are bothersome): Diarrhea Hair loss Joint pain Loss of appetite Muscle pain Nausea Vomiting This list may not describe all possible side effects. Call your doctor for medical advice about side effects. You may report side effects to FDA at 1-800-FDA-1088. Where should I keep my  medication? This medication is given in a hospital or clinic. It will not be stored at home. NOTE: This sheet is a summary. It may not cover all possible information. If you have questions about this medicine, talk to your doctor, pharmacist, or health care provider.  2023 Elsevier/Gold Standard (2021-10-10 00:00:00)  Carboplatin Injection What is this medication? CARBOPLATIN (KAR boe pla tin) treats some types of cancer. It works by slowing down the growth of cancer cells. This medicine may be used for other purposes; ask your health care provider or pharmacist if you have questions. COMMON BRAND NAME(S): Paraplatin What should I tell my care team before I take this medication? They need to know if you have any of these conditions: Blood disorders Hearing problems Kidney disease Recent or ongoing radiation therapy An unusual or allergic reaction to carboplatin, cisplatin, other medications, foods, dyes, or preservatives Pregnant or trying to get pregnant Breast-feeding How should I use this medication? This medication is injected into a vein. It is given by your care team in a hospital or clinic setting. Talk to your care team about the use of this medication in children. Special care may be needed. Overdosage: If you think you have taken too much of this medicine contact a poison control center or emergency room at once. NOTE: This medicine is only for you. Do not share this medicine with others. What if I miss a dose? Keep appointments for follow-up doses. It is important not to miss your dose. Call your care team if you are unable to keep an appointment. What may interact with this medication? Medications for seizures Some antibiotics, such as amikacin, gentamicin, neomycin, streptomycin, tobramycin Vaccines This list may not describe all possible interactions. Give your health care provider a list of all the medicines, herbs, non-prescription drugs, or dietary supplements you use.  Also tell them if you smoke, drink alcohol, or use illegal drugs. Some items may interact with your medicine. What should I watch for while using this medication? Your condition will be monitored carefully while you are receiving this medication. You may need blood work while taking this medication. This medication may make you feel generally unwell. This is not uncommon, as chemotherapy can affect healthy cells as well as cancer cells. Report any side effects. Continue your course of treatment even though you feel ill unless your care team tells you to stop. In some cases, you may be given additional medications to help with side effects. Follow all directions for their use. This medication may increase your risk of getting an infection. Call your care team for advice if you get a fever, chills, sore throat, or other symptoms of a cold or flu. Do not treat yourself. Try to avoid being around people who are sick. Avoid taking medications that contain aspirin, acetaminophen, ibuprofen, naproxen, or ketoprofen unless instructed by your care team. These medications may hide a fever. Be careful brushing or flossing your teeth or using a toothpick because you may get an infection or   bleed more easily. If you have any dental work done, tell your dentist you are receiving this medication. Talk to your care team if you wish to become pregnant or think you might be pregnant. This medication can cause serious birth defects. Talk to your care team about effective forms of contraception. Do not breast-feed while taking this medication. What side effects may I notice from receiving this medication? Side effects that you should report to your care team as soon as possible: Allergic reactions--skin rash, itching, hives, swelling of the face, lips, tongue, or throat Infection--fever, chills, cough, sore throat, wounds that don't heal, pain or trouble when passing urine, general feeling of discomfort or being  unwell Low red blood cell level--unusual weakness or fatigue, dizziness, headache, trouble breathing Pain, tingling, or numbness in the hands or feet, muscle weakness, change in vision, confusion or trouble speaking, loss of balance or coordination, trouble walking, seizures Unusual bruising or bleeding Side effects that usually do not require medical attention (report to your care team if they continue or are bothersome): Hair loss Nausea Unusual weakness or fatigue Vomiting This list may not describe all possible side effects. Call your doctor for medical advice about side effects. You may report side effects to FDA at 1-800-FDA-1088. Where should I keep my medication? This medication is given in a hospital or clinic. It will not be stored at home. NOTE: This sheet is a summary. It may not cover all possible information. If you have questions about this medicine, talk to your doctor, pharmacist, or health care provider.  2023 Elsevier/Gold Standard (2007-07-17 00:00:00)   

## 2022-08-25 NOTE — Telephone Encounter (Signed)
I agree. It is ok to defer this followup, push it out 3 months

## 2022-08-26 ENCOUNTER — Ambulatory Visit
Admission: RE | Admit: 2022-08-26 | Discharge: 2022-08-26 | Disposition: A | Discharge status: Home / Self Care | Payer: Medicare Other | Source: Ambulatory Visit | Attending: Radiation Oncology | Admitting: Radiation Oncology

## 2022-08-26 ENCOUNTER — Telehealth: Payer: Self-pay | Admitting: Emergency Medicine

## 2022-08-26 ENCOUNTER — Encounter (HOSPITAL_COMMUNITY): Payer: Self-pay

## 2022-08-26 ENCOUNTER — Other Ambulatory Visit: Payer: Self-pay

## 2022-08-26 DIAGNOSIS — C3412 Malignant neoplasm of upper lobe, left bronchus or lung: Secondary | ICD-10-CM | POA: Diagnosis not present

## 2022-08-26 DIAGNOSIS — Z87891 Personal history of nicotine dependence: Secondary | ICD-10-CM | POA: Diagnosis not present

## 2022-08-26 DIAGNOSIS — Z510 Encounter for antineoplastic radiation therapy: Secondary | ICD-10-CM | POA: Diagnosis not present

## 2022-08-26 DIAGNOSIS — Z5111 Encounter for antineoplastic chemotherapy: Secondary | ICD-10-CM | POA: Diagnosis not present

## 2022-08-26 LAB — RAD ONC ARIA SESSION SUMMARY
Course Elapsed Days: 1
Plan Fractions Treated to Date: 2
Plan Prescribed Dose Per Fraction: 2 Gy
Plan Total Fractions Prescribed: 30
Plan Total Prescribed Dose: 60 Gy
Reference Point Dosage Given to Date: 4 Gy
Reference Point Session Dosage Given: 2 Gy
Session Number: 2

## 2022-08-26 NOTE — Telephone Encounter (Signed)
Patient would like inhaler called into pharmacy. Pharmacy is [facility]. Patient phone number is [phone].

## 2022-08-27 ENCOUNTER — Other Ambulatory Visit: Payer: Self-pay

## 2022-08-27 ENCOUNTER — Encounter: Payer: Self-pay | Admitting: Internal Medicine

## 2022-08-27 ENCOUNTER — Ambulatory Visit
Admission: RE | Admit: 2022-08-27 | Discharge: 2022-08-27 | Disposition: A | Discharge status: Home / Self Care | Payer: Medicare Other | Source: Ambulatory Visit | Attending: Radiation Oncology | Admitting: Radiation Oncology

## 2022-08-27 DIAGNOSIS — Z5111 Encounter for antineoplastic chemotherapy: Secondary | ICD-10-CM | POA: Diagnosis not present

## 2022-08-27 DIAGNOSIS — Z87891 Personal history of nicotine dependence: Secondary | ICD-10-CM | POA: Diagnosis not present

## 2022-08-27 DIAGNOSIS — Z510 Encounter for antineoplastic radiation therapy: Secondary | ICD-10-CM | POA: Diagnosis not present

## 2022-08-27 DIAGNOSIS — C3412 Malignant neoplasm of upper lobe, left bronchus or lung: Secondary | ICD-10-CM | POA: Diagnosis not present

## 2022-08-27 LAB — RAD ONC ARIA SESSION SUMMARY
Course Elapsed Days: 2
Plan Fractions Treated to Date: 3
Plan Prescribed Dose Per Fraction: 2 Gy
Plan Total Fractions Prescribed: 30
Plan Total Prescribed Dose: 60 Gy
Reference Point Dosage Given to Date: 6 Gy
Reference Point Session Dosage Given: 2 Gy
Session Number: 3

## 2022-08-27 NOTE — Telephone Encounter (Signed)
Ok to prescribe albuterol HFA, q4h prn SOB With these sx would have her come in to be seen for an acute visit

## 2022-08-27 NOTE — Telephone Encounter (Signed)
Agree with albuterol inhaler.  I believe that this is probably been ordered as I answered a MyChart message.  She needs to be seen in office as an acute visit given her symptoms (and this may have already been arranged as well).  I agree with obtaining pulmonary function testing.  Okay to order.

## 2022-08-27 NOTE — Telephone Encounter (Signed)
[facility] in [address]  I read Dr.'s notes to PT. She expressed understanding.  Pls call in Albuterol as pr notes below.   PT will see if this works before setting Acute appt. , she said.  We will also see her in May for PFT and FU w/NP already scheduled. Marland Kitchen

## 2022-08-27 NOTE — Telephone Encounter (Signed)
Patient is requesting a PFT and possibly a inhaler. She has advised breathing has became worse since recently starting chemo and radiation.  Patient also wanted to know about some breathing exercises?  Dr. Lamonte Sakai can you please advise?

## 2022-08-27 NOTE — Telephone Encounter (Signed)
ATC X1 LVM for patient to call the office back 

## 2022-08-28 ENCOUNTER — Encounter: Payer: Self-pay | Admitting: Medical Oncology

## 2022-08-28 ENCOUNTER — Other Ambulatory Visit: Payer: Self-pay

## 2022-08-28 ENCOUNTER — Ambulatory Visit
Admission: RE | Admit: 2022-08-28 | Discharge: 2022-08-28 | Disposition: A | Discharge status: Home / Self Care | Payer: Medicare Other | Source: Ambulatory Visit | Attending: Radiation Oncology | Admitting: Radiation Oncology

## 2022-08-28 DIAGNOSIS — C3412 Malignant neoplasm of upper lobe, left bronchus or lung: Secondary | ICD-10-CM | POA: Diagnosis not present

## 2022-08-28 DIAGNOSIS — Z5111 Encounter for antineoplastic chemotherapy: Secondary | ICD-10-CM | POA: Diagnosis not present

## 2022-08-28 DIAGNOSIS — Z87891 Personal history of nicotine dependence: Secondary | ICD-10-CM | POA: Diagnosis not present

## 2022-08-28 DIAGNOSIS — C3490 Malignant neoplasm of unspecified part of unspecified bronchus or lung: Secondary | ICD-10-CM

## 2022-08-28 DIAGNOSIS — Z510 Encounter for antineoplastic radiation therapy: Secondary | ICD-10-CM | POA: Diagnosis not present

## 2022-08-28 LAB — RAD ONC ARIA SESSION SUMMARY
Course Elapsed Days: 3
Plan Fractions Treated to Date: 4
Plan Prescribed Dose Per Fraction: 2 Gy
Plan Total Fractions Prescribed: 30
Plan Total Prescribed Dose: 60 Gy
Reference Point Dosage Given to Date: 8 Gy
Reference Point Session Dosage Given: 2 Gy
Session Number: 4

## 2022-08-28 MED ORDER — SONAFINE EX EMUL
1.0000 | Freq: Once | CUTANEOUS | Status: AC
  Administered 2022-08-28: 1 via TOPICAL

## 2022-08-28 MED ORDER — ALBUTEROL SULFATE HFA 108 (90 BASE) MCG/ACT IN AERS: 2.0000 | INHALATION_SPRAY | Freq: Four times a day (QID) | RESPIRATORY_TRACT | 6 refills | Status: DC | PRN

## 2022-08-28 NOTE — Telephone Encounter (Signed)
See phone note from 08/26/22.

## 2022-08-28 NOTE — Telephone Encounter (Signed)
Albuterol sent in. PFT scheduled. Nothing further needed.

## 2022-08-29 ENCOUNTER — Ambulatory Visit
Admission: RE | Admit: 2022-08-29 | Discharge: 2022-08-29 | Disposition: A | Discharge status: Home / Self Care | Payer: Medicare Other | Source: Ambulatory Visit | Attending: Radiation Oncology | Admitting: Radiation Oncology

## 2022-08-29 ENCOUNTER — Ambulatory Visit: Payer: Medicare Other

## 2022-08-29 ENCOUNTER — Encounter: Payer: Self-pay | Admitting: Internal Medicine

## 2022-08-29 ENCOUNTER — Other Ambulatory Visit: Payer: Self-pay

## 2022-08-29 DIAGNOSIS — Z510 Encounter for antineoplastic radiation therapy: Secondary | ICD-10-CM | POA: Diagnosis not present

## 2022-08-29 DIAGNOSIS — Z87891 Personal history of nicotine dependence: Secondary | ICD-10-CM | POA: Diagnosis not present

## 2022-08-29 DIAGNOSIS — Z5111 Encounter for antineoplastic chemotherapy: Secondary | ICD-10-CM | POA: Diagnosis not present

## 2022-08-29 DIAGNOSIS — C3412 Malignant neoplasm of upper lobe, left bronchus or lung: Secondary | ICD-10-CM | POA: Diagnosis not present

## 2022-08-29 LAB — RAD ONC ARIA SESSION SUMMARY
Course Elapsed Days: 4
Plan Fractions Treated to Date: 5
Plan Prescribed Dose Per Fraction: 2 Gy
Plan Total Fractions Prescribed: 30
Plan Total Prescribed Dose: 60 Gy
Reference Point Dosage Given to Date: 10 Gy
Reference Point Session Dosage Given: 2 Gy
Session Number: 5

## 2022-08-29 MED FILL — Dexamethasone Sodium Phosphate Inj 100 MG/10ML: INTRAMUSCULAR | Qty: 1 | Status: AC

## 2022-08-30 ENCOUNTER — Encounter: Payer: Self-pay | Admitting: Family Medicine

## 2022-08-31 ENCOUNTER — Encounter: Payer: Self-pay | Admitting: Emergency Medicine

## 2022-09-01 ENCOUNTER — Inpatient Hospital Stay: Payer: Medicare Other

## 2022-09-01 ENCOUNTER — Other Ambulatory Visit: Payer: Self-pay

## 2022-09-01 ENCOUNTER — Ambulatory Visit
Admission: RE | Admit: 2022-09-01 | Discharge: 2022-09-01 | Disposition: A | Discharge status: Home / Self Care | Payer: Medicare Other | Source: Ambulatory Visit | Attending: Radiation Oncology | Admitting: Radiation Oncology

## 2022-09-01 VITALS — BP 108/72 | HR 94 | Temp 97.9°F | Resp 20 | Wt 121.8 lb

## 2022-09-01 DIAGNOSIS — C3412 Malignant neoplasm of upper lobe, left bronchus or lung: Secondary | ICD-10-CM | POA: Diagnosis not present

## 2022-09-01 DIAGNOSIS — C3490 Malignant neoplasm of unspecified part of unspecified bronchus or lung: Secondary | ICD-10-CM

## 2022-09-01 DIAGNOSIS — Z510 Encounter for antineoplastic radiation therapy: Secondary | ICD-10-CM | POA: Diagnosis not present

## 2022-09-01 DIAGNOSIS — Z5111 Encounter for antineoplastic chemotherapy: Secondary | ICD-10-CM | POA: Diagnosis not present

## 2022-09-01 DIAGNOSIS — Z87891 Personal history of nicotine dependence: Secondary | ICD-10-CM | POA: Diagnosis not present

## 2022-09-01 LAB — CMP (CANCER CENTER ONLY)
ALT: 23 U/L (ref 0–44)
AST: 18 U/L (ref 15–41)
Albumin: 3.4 g/dL — ABNORMAL LOW (ref 3.5–5.0)
Alkaline Phosphatase: 95 U/L (ref 38–126)
Anion gap: 8 (ref 5–15)
BUN: 10 mg/dL (ref 8–23)
CO2: 27 mmol/L (ref 22–32)
Calcium: 9 mg/dL (ref 8.9–10.3)
Chloride: 102 mmol/L (ref 98–111)
Creatinine: 0.69 mg/dL (ref 0.44–1.00)
GFR, Estimated: >60 mL/min (ref >60)
Glucose, Bld: 97 mg/dL (ref 70–99)
Potassium: 3.6 mmol/L (ref 3.5–5.1)
Sodium: 137 mmol/L (ref 135–145)
Total Bilirubin: 0.3 mg/dL (ref 0.3–1.2)
Total Protein: 6.8 g/dL (ref 6.5–8.1)

## 2022-09-01 LAB — CBC WITH DIFFERENTIAL (CANCER CENTER ONLY)
Abs Immature Granulocytes: 0.04 10*3/uL (ref 0.00–0.07)
Basophils Absolute: 0 10*3/uL (ref 0.0–0.1)
Basophils Relative: 1 %
Eosinophils Absolute: 0.2 10*3/uL (ref 0.0–0.5)
Eosinophils Relative: 2 %
HCT: 36 % (ref 36.0–46.0)
Hemoglobin: 11.8 g/dL — ABNORMAL LOW (ref 12.0–15.0)
Immature Granulocytes: 1 %
Lymphocytes Relative: 6 %
Lymphs Abs: 0.4 10*3/uL — ABNORMAL LOW (ref 0.7–4.0)
MCH: 29.6 pg (ref 26.0–34.0)
MCHC: 32.8 g/dL (ref 30.0–36.0)
MCV: 90.5 fL (ref 80.0–100.0)
Monocytes Absolute: 0.7 10*3/uL (ref 0.1–1.0)
Monocytes Relative: 11 %
Neutro Abs: 5.2 10*3/uL (ref 1.7–7.7)
Neutrophils Relative %: 79 %
Platelet Count: 408 10*3/uL — ABNORMAL HIGH (ref 150–400)
RBC: 3.98 MIL/uL (ref 3.87–5.11)
RDW: 13.8 % (ref 11.5–15.5)
WBC Count: 6.5 10*3/uL (ref 4.0–10.5)
nRBC: 0 % (ref 0.0–0.2)

## 2022-09-01 LAB — RAD ONC ARIA SESSION SUMMARY
Course Elapsed Days: 7
Plan Fractions Treated to Date: 6
Plan Prescribed Dose Per Fraction: 2 Gy
Plan Total Fractions Prescribed: 30
Plan Total Prescribed Dose: 60 Gy
Reference Point Dosage Given to Date: 12 Gy
Reference Point Session Dosage Given: 2 Gy
Session Number: 6

## 2022-09-01 MED ORDER — SODIUM CHLORIDE 0.9 % IV SOLN
10.0000 mg | Freq: Once | INTRAVENOUS | Status: AC
  Administered 2022-09-01: 10 mg via INTRAVENOUS
  Filled 2022-09-01: qty 10

## 2022-09-01 MED ORDER — FAMOTIDINE IN NACL 20-0.9 MG/50ML-% IV SOLN
20.0000 mg | Freq: Once | INTRAVENOUS | Status: AC
  Administered 2022-09-01: 20 mg via INTRAVENOUS
  Filled 2022-09-01: qty 50

## 2022-09-01 MED ORDER — PALONOSETRON HCL INJECTION 0.25 MG/5ML
0.2500 mg | Freq: Once | INTRAVENOUS | Status: AC
  Administered 2022-09-01: 0.25 mg via INTRAVENOUS
  Filled 2022-09-01: qty 5

## 2022-09-01 MED ORDER — DIPHENHYDRAMINE HCL 50 MG/ML IJ SOLN
50.0000 mg | Freq: Once | INTRAMUSCULAR | Status: AC
  Administered 2022-09-01: 50 mg via INTRAVENOUS
  Filled 2022-09-01: qty 1

## 2022-09-01 MED ORDER — SODIUM CHLORIDE 0.9 % IV SOLN
139.2000 mg | Freq: Once | INTRAVENOUS | Status: AC
  Administered 2022-09-01: 140 mg via INTRAVENOUS
  Filled 2022-09-01: qty 14

## 2022-09-01 MED ORDER — SODIUM CHLORIDE 0.9 % IV SOLN
45.0000 mg/m2 | Freq: Once | INTRAVENOUS | Status: AC
  Administered 2022-09-01: 72 mg via INTRAVENOUS
  Filled 2022-09-01: qty 12

## 2022-09-01 MED ORDER — SODIUM CHLORIDE 0.9 % IV SOLN: Freq: Once | INTRAVENOUS | Status: AC

## 2022-09-01 NOTE — Patient Instructions (Signed)
[facility]  Discharge Instructions: Thank you for choosing [facility] to provide your oncology and hematology care.   If you have a lab appointment with the Cancer Center, please go directly to the Cancer Center and check in at the registration area.   Wear comfortable clothing and clothing appropriate for easy access to any Portacath or PICC line.   We strive to give you quality time with your provider. You may need to reschedule your appointment if you arrive late (15 or more minutes).  Arriving late affects you and other patients whose appointments are after yours.  Also, if you miss three or more appointments without notifying the office, you may be dismissed from the clinic at the provider's discretion.      For prescription refill requests, have your pharmacy contact our office and allow 72 hours for refills to be completed.    Today you received the following chemotherapy and/or immunotherapy agents - Taxol/Carboplatin      To help prevent nausea and vomiting after your treatment, we encourage you to take your nausea medication as directed.  BELOW ARE SYMPTOMS THAT SHOULD BE REPORTED IMMEDIATELY: *FEVER GREATER THAN 100.4 F (38 C) OR HIGHER *CHILLS OR SWEATING *NAUSEA AND VOMITING THAT IS NOT CONTROLLED WITH YOUR NAUSEA MEDICATION *UNUSUAL SHORTNESS OF BREATH *UNUSUAL BRUISING OR BLEEDING *URINARY PROBLEMS (pain or burning when urinating, or frequent urination) *BOWEL PROBLEMS (unusual diarrhea, constipation, pain near the anus) TENDERNESS IN MOUTH AND THROAT WITH OR WITHOUT PRESENCE OF ULCERS (sore throat, sores in mouth, or a toothache) UNUSUAL RASH, SWELLING OR PAIN  UNUSUAL VAGINAL DISCHARGE OR ITCHING   Items with * indicate a potential emergency and should be followed up as soon as possible or go to the Emergency Department if any problems should occur.  Please show the CHEMOTHERAPY ALERT CARD or IMMUNOTHERAPY ALERT CARD  at check-in to the Emergency Department and triage nurse.  Should you have questions after your visit or need to cancel or reschedule your appointment, please contact [facility]  Dept: [phone]  and follow the prompts.  Office hours are 8:00 a.m. to 4:30 p.m. Monday - Friday. Please note that voicemails left after 4:00 p.m. may not be returned until the following business day.  We are closed weekends and major holidays. You have access to a nurse at all times for urgent questions. Please call the main number to the clinic Dept: [phone] and follow the prompts.   For any non-urgent questions, you may also contact your provider using MyChart. We now offer e-Visits for anyone 18 and older to request care online for non-urgent symptoms. For details visit [email or web address].   Also download the MyChart app! Go to the app store, search "MyChart", open the app, select [facility], and log in with your MyChart username and password.  

## 2022-09-01 NOTE — Telephone Encounter (Signed)
Received the following message from patient:   "The albuterol inhaler that you ordered helped my breathing tremendously for short term. I was wondering if albuterol nebulizer on a regular basis while I'm taking chemo and radiation would make my breathing consistently easier. Please advise . Thank you so much for all your help and consideration."  RB, can you please advise? Thanks!

## 2022-09-02 ENCOUNTER — Other Ambulatory Visit: Payer: Self-pay

## 2022-09-02 ENCOUNTER — Ambulatory Visit
Admission: RE | Admit: 2022-09-02 | Discharge: 2022-09-02 | Disposition: A | Discharge status: Home / Self Care | Payer: Medicare Other | Source: Ambulatory Visit | Attending: Radiation Oncology | Admitting: Radiation Oncology

## 2022-09-02 DIAGNOSIS — C3412 Malignant neoplasm of upper lobe, left bronchus or lung: Secondary | ICD-10-CM | POA: Diagnosis not present

## 2022-09-02 DIAGNOSIS — Z5111 Encounter for antineoplastic chemotherapy: Secondary | ICD-10-CM | POA: Diagnosis not present

## 2022-09-02 DIAGNOSIS — Z87891 Personal history of nicotine dependence: Secondary | ICD-10-CM | POA: Diagnosis not present

## 2022-09-02 DIAGNOSIS — C3490 Malignant neoplasm of unspecified part of unspecified bronchus or lung: Secondary | ICD-10-CM

## 2022-09-02 DIAGNOSIS — Z510 Encounter for antineoplastic radiation therapy: Secondary | ICD-10-CM | POA: Diagnosis not present

## 2022-09-02 LAB — RAD ONC ARIA SESSION SUMMARY
Course Elapsed Days: 8
Plan Fractions Treated to Date: 7
Plan Prescribed Dose Per Fraction: 2 Gy
Plan Total Fractions Prescribed: 30
Plan Total Prescribed Dose: 60 Gy
Reference Point Dosage Given to Date: 14 Gy
Reference Point Session Dosage Given: 2 Gy
Session Number: 7

## 2022-09-02 MED ORDER — ALBUTEROL SULFATE (2.5 MG/3ML) 0.083% IN NEBU: 2.5000 mg | INHALATION_SOLUTION | Freq: Four times a day (QID) | RESPIRATORY_TRACT | 12 refills | Status: DC | PRN

## 2022-09-02 MED ORDER — SPIRIVA RESPIMAT 2.5 MCG/ACT IN AERS: 2.0000 | INHALATION_SPRAY | Freq: Every day | RESPIRATORY_TRACT | 4 refills | Status: DC

## 2022-09-02 NOTE — Telephone Encounter (Signed)
Given the significant improvement when she uses albuterol, her history of tobacco use, I think would be reasonable to put her on scheduled medication for presumed COPD.  She will still need to get her PFT at some point going forward.  Please have her start Spiriva Respimat 2 puffs once daily.  Okay for her to continue to use her albuterol 2 puffs as needed Okay to send a prescription for a nebulizer machine and albuterol nebs for her to use up to every 4 hours if needed for shortness of breath, chest tightness, wheezing.

## 2022-09-02 NOTE — Progress Notes (Signed)
Navigator following up on

## 2022-09-03 ENCOUNTER — Other Ambulatory Visit: Payer: Self-pay

## 2022-09-03 ENCOUNTER — Other Ambulatory Visit: Payer: Self-pay | Admitting: Radiology

## 2022-09-03 ENCOUNTER — Ambulatory Visit
Admission: RE | Admit: 2022-09-03 | Discharge: 2022-09-03 | Disposition: A | Discharge status: Home / Self Care | Payer: Medicare Other | Source: Ambulatory Visit | Attending: Radiation Oncology | Admitting: Radiation Oncology

## 2022-09-03 DIAGNOSIS — Z87891 Personal history of nicotine dependence: Secondary | ICD-10-CM | POA: Diagnosis not present

## 2022-09-03 DIAGNOSIS — Z9221 Personal history of antineoplastic chemotherapy: Secondary | ICD-10-CM | POA: Diagnosis not present

## 2022-09-03 DIAGNOSIS — Z5111 Encounter for antineoplastic chemotherapy: Secondary | ICD-10-CM | POA: Diagnosis not present

## 2022-09-03 DIAGNOSIS — Z452 Encounter for adjustment and management of vascular access device: Secondary | ICD-10-CM | POA: Diagnosis not present

## 2022-09-03 DIAGNOSIS — C3490 Malignant neoplasm of unspecified part of unspecified bronchus or lung: Secondary | ICD-10-CM | POA: Diagnosis not present

## 2022-09-03 DIAGNOSIS — Z510 Encounter for antineoplastic radiation therapy: Secondary | ICD-10-CM | POA: Diagnosis not present

## 2022-09-03 DIAGNOSIS — C3412 Malignant neoplasm of upper lobe, left bronchus or lung: Secondary | ICD-10-CM | POA: Diagnosis not present

## 2022-09-03 DIAGNOSIS — Z923 Personal history of irradiation: Secondary | ICD-10-CM | POA: Diagnosis not present

## 2022-09-03 LAB — RAD ONC ARIA SESSION SUMMARY
Course Elapsed Days: 9
Plan Fractions Treated to Date: 8
Plan Prescribed Dose Per Fraction: 2 Gy
Plan Total Fractions Prescribed: 30
Plan Total Prescribed Dose: 60 Gy
Reference Point Dosage Given to Date: 16 Gy
Reference Point Session Dosage Given: 2 Gy
Session Number: 8

## 2022-09-03 NOTE — Consult Note (Signed)
Chief Complaint: Patient was seen in consultation today for port a cath placement   Referring Physician(s): Mohamed,M  Supervising Physician: Daryll Brod  Patient Status: [facility] - Out-pt  History of Present Illness: Evelyn Jordan is a 72 y.o. female , ex smoker, with hx of stage IIIb (T3, N2, M0) non-small cell lung cancer, squamous cell carcinoma, who  presented with obstructive left lingular mass with consolidation and collapse of the lingula as well as part of the left upper lobe and likely invasion of the mediastinum diagnosed in February 2024 . She is undergoing chemoradiation. She presents today for port a cath placement to assist with treatment.   Past Medical History:  Diagnosis Date   Depression    Insomnia    Osteoporosis    Pulmonary nodule    Thyroid nodule     Past Surgical History:  Procedure Laterality Date   ANKLE SURGERY Right    BREAST BIOPSY Right    benign 2013   BRONCHIAL BIOPSY  08/04/2022   Procedure: BRONCHIAL BIOPSIES;  Surgeon: Collene Gobble, MD;  Location: [place];  Service: Cardiopulmonary;;   BRONCHIAL BRUSHINGS  08/04/2022   Procedure: BRONCHIAL BRUSHINGS;  Surgeon: Collene Gobble, MD;  Location: [facility] ENDOSCOPY;  Service: Cardiopulmonary;;   HEMOSTASIS CONTROL  08/04/2022   Procedure: HEMOSTASIS CONTROL;  Surgeon: Collene Gobble, MD;  Location: [facility] ENDOSCOPY;  Service: Cardiopulmonary;;   repair of a torn meniscus Left 05/2018   VIDEO BRONCHOSCOPY Left 08/04/2022   Procedure: VIDEO BRONCHOSCOPY WITH FLUORO;  Surgeon: Collene Gobble, MD;  Location: [place];  Service: Cardiopulmonary;  Laterality: Left;    Allergies: Compazine [prochlorperazine edisylate]  Medications: Prior to Admission medications   Medication Sig Start Date End Date Taking? Authorizing Provider  albuterol (PROVENTIL) (2.5 MG/3ML) 0.083% nebulizer solution Take 3 mLs (2.5 mg total) by nebulization every 6 (six) hours as needed for wheezing or shortness of  breath. 09/02/22   Byrum, Rose Fillers, MD  albuterol (VENTOLIN HFA) 108 (90 Base) MCG/ACT inhaler Inhale 2 puffs into the lungs every 6 (six) hours as needed for wheezing or shortness of breath. 08/28/22   Byrum, Rose Fillers, MD  ALPRAZolam Duanne Moron) 0.5 MG tablet Take 1 tablet (0.5 mg total) by mouth at bedtime as needed for anxiety or sleep. 08/25/22   Leamon Arnt, MD  docusate sodium (COLACE) 100 MG capsule Take 100 mg by mouth every other day.    [provider]  HYDROcodone bit-homatropine (HYCODAN) 5-1.5 MG/5ML syrup Take 5 mLs by mouth every 6 (six) hours as needed for cough. 08/22/22   Heilingoetter, Cassandra L, PA-C  lidocaine-prilocaine (EMLA) cream Apply 1 Application topically as needed. 08/25/22   Heilingoetter, Cassandra L, PA-C  ondansetron (ZOFRAN) 8 MG tablet Take 1 tablet (8 mg total) by mouth every 8 (eight) hours as needed for nausea or vomiting. 08/12/22   Curt Bears, MD  Tiotropium Bromide Monohydrate (SPIRIVA RESPIMAT) 2.5 MCG/ACT AERS Inhale 2 puffs into the lungs daily. 09/02/22   Collene Gobble, MD     Family History  Problem Relation Age of Onset   Alzheimer's disease Mother    Hypertension Father    Stroke Father    Breast cancer Neg Hx     Social History   Socioeconomic History   Marital status: Married    Spouse name: Archie   Number of children: Not on file   Years of education: Not on file   Highest education level: Not on file  Occupational  History    Comment: retired Marine scientist   Tobacco Use   Smoking status: Former    Packs/day: 1.00    Years: 40.00    Additional pack years: 0.00    Total pack years: 40.00    Types: Cigarettes    Start date: 74    Quit date: 2012    Years since quitting: 12.2   Smokeless tobacco: Never  Vaping Use   Vaping Use: Never used  Substance and Sexual Activity   Alcohol use: Yes    Comment: Rarely   Drug use: No   Sexual activity: Not on file  Other Topics Concern   Not on file  Social History Narrative    Not on file   Social Determinants of Health   Financial Resource Strain: Low Risk  (10/03/2021)   Overall Financial Resource Strain (CARDIA)    Difficulty of Paying Living Expenses: Not hard at all  Food Insecurity: No Food Insecurity (08/19/2022)   Hunger Vital Sign    Worried About Running Out of Food in the Last Year: Never true    Ran Out of Food in the Last Year: Never true  Transportation Needs: No Transportation Needs (08/19/2022)   PRAPARE - Hydrologist (Medical): No    Lack of Transportation (Non-Medical): No  Physical Activity: Insufficiently Active (10/03/2021)   Exercise Vital Sign    Days of Exercise per Week: 7 days    Minutes of Exercise per Session: 20 min  Stress: No Stress Concern Present (10/03/2021)   [place]    Feeling of Stress : Not at all  Social Connections: Moderately Isolated (10/03/2021)   Social Connection and Isolation Panel [NHANES]    Frequency of Communication with Friends and Family: More than three times a week    Frequency of Social Gatherings with Friends and Family: More than three times a week    Attends Religious Services: Never    Marine scientist or Organizations: No    Attends Music therapist: Never    Marital Status: Married      Review of Systems: denies fever,HA,CP,abd/back pain,N/V or bleeding; does have dyspnea with exertion and occ cough  Vital Signs: Vitals:   09/04/22 0906  BP: 124/75  Pulse: 80  Resp: 16  Temp: 98.1 F (36.7 C)  SpO2: 99%      Code Status: FULL CODE   Physical Exam: awake/alert; chest- CTA bilat; heart- RRR; abd- soft,+BS,NT; no LE edema     Imaging: MR BRAIN W WO CONTRAST  Result Date: 08/20/2022 CLINICAL DATA:  Non-small cell lung cancer.  Staging.  Intra-axial EXAM: MRI HEAD WITHOUT AND WITH CONTRAST TECHNIQUE: Multiplanar, multiecho pulse sequences of the brain and  surrounding structures were obtained without and with intravenous contrast. CONTRAST:  5.4mL GADAVIST GADOBUTROL 1 MMOL/ML IV SOLN COMPARISON:  None Available. FINDINGS: Brain: The brain has a normal appearance without evidence of malformation, atrophy, old or acute small or large vessel infarction, mass lesion, hemorrhage, hydrocephalus or extra-axial collection. Incidental small meningioma at the right lateral frontal convexity with a diameter of 11 mm and maximal thickness of 5 mm. No mass-effect upon the brain. This should not be of clinical relevance. Vascular: Major vessels at the base of the brain show flow. Venous sinuses appear patent. Skull and upper cervical spine: Normal. Sinuses/Orbits: Clear/normal. Other: None significant. IMPRESSION: 1. Normal appearance of the brain itself. No evidence of metastatic disease. 2.  Incidental small meningioma at the right lateral frontal convexity measuring 11 x 5 mm. No mass-effect upon the brain. This should not be of clinical relevance. Electronically Signed   By: Nelson Chimes M.D.   On: 08/20/2022 11:17    Labs:  CBC: Recent Labs    08/11/22 1334 08/18/22 0810 08/25/22 1045 09/01/22 1224  WBC 8.0 7.7 7.5 6.5  HGB 12.1 11.9* 12.0 11.8*  HCT 36.3 36.5 36.7 36.0  PLT 329 344 390 408*    COAGS: No results for input(s): "INR", "APTT" in the last 8760 hours.  BMP: Recent Labs    08/11/22 1334 08/18/22 0810 08/25/22 1045 09/01/22 1224  NA 138 140 138 137  K 4.0 3.5 4.0 3.6  CL 104 105 103 102  CO2 28 27 28 27   GLUCOSE 108* 130* 89 97  BUN 9 11 14 10   CALCIUM 9.3 9.0 9.1 9.0  CREATININE 0.79 0.89 0.82 0.69  GFRNONAA >60 >60 >60 >60    LIVER FUNCTION TESTS: Recent Labs    08/11/22 1334 08/18/22 0810 08/25/22 1045 09/01/22 1224  BILITOT 0.3 0.3 0.3 0.3  AST 11* 12* 11* 18  ALT 9 8 9 23   ALKPHOS 76 64 69 95  PROT 6.9 6.6 7.1 6.8  ALBUMIN 3.6 3.4* 3.6 3.4*    TUMOR MARKERS: No results for input(s): "AFPTM", "CEA", "CA199",  "CHROMGRNA" in the last 8760 hours.  Assessment and Plan: 72 y.o. female , ex smoker, with hx of stage IIIb (T3, N2, M0) non-small cell lung cancer, squamous cell carcinoma, who  presented with obstructive left lingular mass with consolidation and collapse of the lingula as well as part of the left upper lobe and likely invasion of the mediastinum diagnosed in February 2024 . She is undergoing chemoradiation. She presents today for port a cath placement to assist with treatment. Risks and benefits of image guided port-a-catheter placement was discussed with the patient/sposue including, but not limited to bleeding, infection, pneumothorax, or fibrin sheath development and need for additional procedures.  All of the patient's questions were answered, patient is agreeable to proceed. Consent signed and in chart.    Thank you for this interesting consult.  I greatly enjoyed meeting DEMETRIS SWISSHELM and look forward to participating in their care.  A copy of this report was sent to the requesting provider on this date.  Electronically Signed: D. Rowe Robert, PA-C 09/03/2022, 5:04 PM   I spent a total of 25 minutes    in face to face in clinical consultation, greater than 50% of which was counseling/coordinating care for port a cath placement

## 2022-09-04 ENCOUNTER — Ambulatory Visit (HOSPITAL_COMMUNITY)
Admission: RE | Admit: 2022-09-04 | Discharge: 2022-09-04 | Disposition: A | Discharge status: Home / Self Care | Payer: Medicare Other | Source: Ambulatory Visit

## 2022-09-04 ENCOUNTER — Ambulatory Visit: Payer: Medicare Other | Admitting: Emergency Medicine

## 2022-09-04 ENCOUNTER — Other Ambulatory Visit: Payer: Self-pay

## 2022-09-04 ENCOUNTER — Ambulatory Visit
Admission: RE | Admit: 2022-09-04 | Discharge: 2022-09-04 | Disposition: A | Discharge status: Home / Self Care | Payer: Medicare Other | Source: Ambulatory Visit | Attending: Radiation Oncology | Admitting: Radiation Oncology

## 2022-09-04 ENCOUNTER — Encounter: Payer: Self-pay | Admitting: Family Medicine

## 2022-09-04 ENCOUNTER — Ambulatory Visit (HOSPITAL_COMMUNITY)
Admission: RE | Admit: 2022-09-04 | Discharge: 2022-09-04 | Disposition: A | Discharge status: Home / Self Care | Payer: Medicare Other | Source: Ambulatory Visit | Attending: Physician Assistant | Admitting: Physician Assistant

## 2022-09-04 ENCOUNTER — Encounter: Payer: Self-pay | Admitting: Emergency Medicine

## 2022-09-04 ENCOUNTER — Encounter (HOSPITAL_COMMUNITY): Payer: Self-pay

## 2022-09-04 ENCOUNTER — Other Ambulatory Visit (HOSPITAL_COMMUNITY): Payer: Medicare Other

## 2022-09-04 ENCOUNTER — Other Ambulatory Visit: Payer: Self-pay | Admitting: Physician Assistant

## 2022-09-04 DIAGNOSIS — Z923 Personal history of irradiation: Secondary | ICD-10-CM | POA: Diagnosis not present

## 2022-09-04 DIAGNOSIS — C3412 Malignant neoplasm of upper lobe, left bronchus or lung: Secondary | ICD-10-CM | POA: Diagnosis not present

## 2022-09-04 DIAGNOSIS — Z87891 Personal history of nicotine dependence: Secondary | ICD-10-CM | POA: Insufficient documentation

## 2022-09-04 DIAGNOSIS — C3490 Malignant neoplasm of unspecified part of unspecified bronchus or lung: Secondary | ICD-10-CM

## 2022-09-04 DIAGNOSIS — Z9221 Personal history of antineoplastic chemotherapy: Secondary | ICD-10-CM | POA: Diagnosis not present

## 2022-09-04 DIAGNOSIS — Z452 Encounter for adjustment and management of vascular access device: Secondary | ICD-10-CM | POA: Diagnosis not present

## 2022-09-04 DIAGNOSIS — Z5111 Encounter for antineoplastic chemotherapy: Secondary | ICD-10-CM | POA: Diagnosis not present

## 2022-09-04 DIAGNOSIS — Z510 Encounter for antineoplastic radiation therapy: Secondary | ICD-10-CM | POA: Diagnosis not present

## 2022-09-04 HISTORY — PX: IR IMAGING GUIDED PORT INSERTION: IMG5740

## 2022-09-04 LAB — RAD ONC ARIA SESSION SUMMARY
Course Elapsed Days: 10
Plan Fractions Treated to Date: 9
Plan Prescribed Dose Per Fraction: 2 Gy
Plan Total Fractions Prescribed: 30
Plan Total Prescribed Dose: 60 Gy
Reference Point Dosage Given to Date: 18 Gy
Reference Point Session Dosage Given: 2 Gy
Session Number: 9

## 2022-09-04 MED ORDER — FENTANYL CITRATE (PF) 100 MCG/2ML IJ SOLN
INTRAMUSCULAR | Status: AC | PRN
  Administered 2022-09-04 (×2): 50 ug via INTRAVENOUS

## 2022-09-04 MED ORDER — FENTANYL CITRATE (PF) 100 MCG/2ML IJ SOLN
INTRAMUSCULAR | Status: AC
  Filled 2022-09-04: qty 2

## 2022-09-04 MED ORDER — MIDAZOLAM HCL 2 MG/2ML IJ SOLN
INTRAMUSCULAR | Status: AC
  Filled 2022-09-04: qty 2

## 2022-09-04 MED ORDER — DIPHENHYDRAMINE HCL 50 MG/ML IJ SOLN
INTRAMUSCULAR | Status: AC | PRN
  Administered 2022-09-04: 25 mg via INTRAVENOUS

## 2022-09-04 MED ORDER — SODIUM CHLORIDE 0.9 % IV SOLN: INTRAVENOUS | Status: DC

## 2022-09-04 MED ORDER — HYDROCODONE BIT-HOMATROP MBR 5-1.5 MG/5ML PO SOLN: 5.0000 mL | Freq: Four times a day (QID) | ORAL | 0 refills | Status: DC | PRN

## 2022-09-04 MED ORDER — LIDOCAINE-EPINEPHRINE 1 %-1:100000 IJ SOLN
INTRAMUSCULAR | Status: AC
  Administered 2022-09-04: 14 mL
  Filled 2022-09-04: qty 1

## 2022-09-04 MED ORDER — DIPHENHYDRAMINE HCL 50 MG/ML IJ SOLN
INTRAMUSCULAR | Status: AC
  Filled 2022-09-04: qty 1

## 2022-09-04 MED ORDER — HEPARIN SOD (PORK) LOCK FLUSH 100 UNIT/ML IV SOLN
INTRAVENOUS | Status: AC
  Administered 2022-09-04: 500 [IU]
  Filled 2022-09-04: qty 5

## 2022-09-04 MED ORDER — MIDAZOLAM HCL 2 MG/2ML IJ SOLN
INTRAMUSCULAR | Status: AC | PRN
  Administered 2022-09-04 (×2): 1 mg via INTRAVENOUS

## 2022-09-04 NOTE — Discharge Instructions (Signed)
Moderate Conscious Sedation, Adult, Care After This sheet gives you information about how to care for yourself after your procedure. Your health care provider may also give you more specific instructions. If you have problems or questions, contact your health care provider. What can I expect after the procedure? After the procedure, it is common to have: Sleepiness for several hours. Impaired judgment for several hours. Difficulty with balance. Vomiting if you eat too soon. Follow these instructions at home: For the time period you were told by your health care provider:     Rest. Do not participate in activities where you could fall or become injured. Do not drive or use machinery. Do not drink alcohol. Do not take sleeping pills or medicines that cause drowsiness. Do not make important decisions or sign legal documents. Do not take care of children on your own. Eating and drinking  Follow the diet recommended by your health care provider. Drink enough fluid to keep your urine pale yellow. If you vomit: Drink water, juice, or soup when you can drink without vomiting. Make sure you have little or no nausea before eating solid foods. General instructions Take over-the-counter and prescription medicines only as told by your health care provider. Have a responsible adult stay with you for the time you are told. It is important to have someone help care for you until you are awake and alert. Do not smoke. Keep all follow-up visits as told by your health care provider. This is important. Contact a health care provider if: You are still sleepy or having trouble with balance after 24 hours. You feel light-headed. You keep feeling nauseous or you keep vomiting. You develop a rash. You have a fever. You have redness or swelling around the IV site. Get help right away if: You have trouble breathing. You have new-onset confusion at home. Summary After the procedure, it is common to  feel sleepy, have impaired judgment, or feel nauseous if you eat too soon. Rest after you get home. Know the things you should not do after the procedure. Follow the diet recommended by your health care provider and drink enough fluid to keep your urine pale yellow. Get help right away if you have trouble breathing or new-onset confusion at home. This information is not intended to replace advice given to you by your health care provider. Make sure you discuss any questions you have with your health care provider. Document Revised: 09/23/2019 Document Reviewed: 04/21/2019 Elsevier Patient Education  2023 Elsevier Inc.      Implanted Port Insertion, Care After The following information offers guidance on how to care for yourself after your procedure. Your health care provider may also give you more specific instructions. If you have problems or questions, contact your health care provider.  Urgent needs- Interventional Radiology clinic- [phone] Wound- May remove dressing in 24-48 hours and shower. Otherwise keep site clean and dry. May replace dressing with clean bandaids as needed.  Your Provider should set up monthly appointments for Port flush.  What can I expect after the procedure? After the procedure, it is common to have: Discomfort at the port insertion site. Bruising on the skin over the port. This should improve over 3-4 days. Follow these instructions at home: Port care After your port is placed, you will get a manufacturer's information card. The card has information about your port. Keep this card with you at all times. Take care of the port as told by your health care provider. Ask your health   care provider if you or a family member can get training for taking care of the port at home. A home health care nurse will be be available to help care for the port. Make sure to remember what type of port you have. Incision care     Follow instructions from your health care  provider about how to take care of your port insertion site. Make sure you: Wash your hands with soap and water for at least 20 seconds before and after you change your bandage (dressing). If soap and water are not available, use hand sanitizer. Change your dressing as told by your health care provider. Leave stitches (sutures), skin glue, or adhesive strips in place. These skin closures may need to stay in place for 2 weeks or longer. If adhesive strip edges start to loosen and curl up, you may trim the loose edges. Do not remove adhesive strips completely unless your health care provider tells you to do that. Check your port insertion site every day for signs of infection. Check for: Redness, swelling, or pain. Fluid or blood. Warmth. Pus or a bad smell. Activity Return to your normal activities as told by your health care provider. Ask your health care provider what activities are safe for you. You may have to avoid lifting. Ask your health care provider how much you can safely lift. General instructions Take over-the-counter and prescription medicines only as told by your health care provider. Do not take baths, swim, or use a hot tub until site healed. Ask your health care provider if you may take showers. You may only be allowed to take sponge baths. If you were given a sedative during the procedure, it can affect you for several hours. Do not drive or operate machinery until your health care provider says that it is safe. Wear a medical alert bracelet in case of an emergency. This will tell any health care providers that you have a port. Keep all follow-up visits. This is important. Contact a health care provider if: You cannot flush your port with saline as directed, or you cannot draw blood from the port. You have a fever or chills. You have redness, swelling, or pain around your port insertion site. You have fluid or blood coming from your port insertion site. Your port insertion  site feels warm to the touch. You have pus or a bad smell coming from the port insertion site. Get help right away if: You have chest pain or shortness of breath. You have bleeding from your port that you cannot control. These symptoms may be an emergency. Get help right away. Call 911. Do not wait to see if the symptoms will go away. Do not drive yourself to the hospital. Summary Take care of the port as told by your health care provider. Keep the manufacturer's information card with you at all times. Change your dressing as told by your health care provider. Contact a health care provider if you have a fever or chills or if you have redness, swelling, or pain around your port insertion site. Keep all follow-up visits. This information is not intended to replace advice given to you by your health care provider. Make sure you discuss any questions you have with your health care provider. Document Revised: 11/27/2020 Document Reviewed: 11/27/2020 Elsevier Patient Education  2023 Elsevier Inc.     

## 2022-09-04 NOTE — Procedures (Signed)
Interventional Radiology Procedure Note  Procedure: RT IJ POWER PORT    Complications: None  Estimated Blood Loss:  MIN  Findings: TIP SVCRA    M. TREVOR Marie Chow, MD    

## 2022-09-05 ENCOUNTER — Other Ambulatory Visit: Payer: Self-pay

## 2022-09-05 ENCOUNTER — Ambulatory Visit
Admission: RE | Admit: 2022-09-05 | Discharge: 2022-09-05 | Disposition: A | Discharge status: Home / Self Care | Payer: Medicare Other | Source: Ambulatory Visit | Attending: Radiation Oncology | Admitting: Radiation Oncology

## 2022-09-05 ENCOUNTER — Telehealth: Payer: Self-pay | Admitting: Internal Medicine

## 2022-09-05 DIAGNOSIS — Z87891 Personal history of nicotine dependence: Secondary | ICD-10-CM | POA: Diagnosis not present

## 2022-09-05 DIAGNOSIS — Z5111 Encounter for antineoplastic chemotherapy: Secondary | ICD-10-CM | POA: Diagnosis not present

## 2022-09-05 DIAGNOSIS — C3412 Malignant neoplasm of upper lobe, left bronchus or lung: Secondary | ICD-10-CM | POA: Diagnosis not present

## 2022-09-05 DIAGNOSIS — Z510 Encounter for antineoplastic radiation therapy: Secondary | ICD-10-CM | POA: Diagnosis not present

## 2022-09-05 LAB — RAD ONC ARIA SESSION SUMMARY
Course Elapsed Days: 11
Plan Fractions Treated to Date: 10
Plan Prescribed Dose Per Fraction: 2 Gy
Plan Total Fractions Prescribed: 30
Plan Total Prescribed Dose: 60 Gy
Reference Point Dosage Given to Date: 20 Gy
Reference Point Session Dosage Given: 2 Gy
Session Number: 10

## 2022-09-05 MED FILL — Dexamethasone Sodium Phosphate Inj 100 MG/10ML: INTRAMUSCULAR | Qty: 1 | Status: AC

## 2022-09-05 NOTE — Telephone Encounter (Signed)
Pt's Spiriva is $630.00 from mail in pharmacy. Is their an alternative she could use?

## 2022-09-05 NOTE — Telephone Encounter (Signed)
Spoke with [facility] who stated pt has already picked up albuterol. Notified pt Spiriva was sent to [facility]. Nothing further needed at this time.

## 2022-09-05 NOTE — Telephone Encounter (Signed)
Incruse would be a good substitute - can we try it to assess cost

## 2022-09-05 NOTE — Telephone Encounter (Signed)
Patient is scheduled for a F/U and PFT on 5/2 with Katie. Will close encounter.

## 2022-09-05 NOTE — Telephone Encounter (Signed)
Called patient regarding upcoming April appointments, patient is notified. 

## 2022-09-08 ENCOUNTER — Ambulatory Visit
Admission: RE | Admit: 2022-09-08 | Discharge: 2022-09-08 | Disposition: A | Discharge status: Home / Self Care | Payer: Medicare Other | Source: Ambulatory Visit | Attending: Radiation Oncology | Admitting: Radiation Oncology

## 2022-09-08 ENCOUNTER — Other Ambulatory Visit: Payer: Self-pay | Admitting: Internal Medicine

## 2022-09-08 ENCOUNTER — Inpatient Hospital Stay (HOSPITAL_BASED_OUTPATIENT_CLINIC_OR_DEPARTMENT_OTHER): Payer: Medicare Other | Admitting: Internal Medicine

## 2022-09-08 ENCOUNTER — Other Ambulatory Visit: Payer: Self-pay

## 2022-09-08 ENCOUNTER — Encounter: Payer: Self-pay | Admitting: Internal Medicine

## 2022-09-08 ENCOUNTER — Inpatient Hospital Stay: Payer: Medicare Other | Attending: Internal Medicine

## 2022-09-08 ENCOUNTER — Inpatient Hospital Stay: Payer: Medicare Other

## 2022-09-08 DIAGNOSIS — Z5111 Encounter for antineoplastic chemotherapy: Secondary | ICD-10-CM | POA: Insufficient documentation

## 2022-09-08 DIAGNOSIS — C3490 Malignant neoplasm of unspecified part of unspecified bronchus or lung: Secondary | ICD-10-CM

## 2022-09-08 DIAGNOSIS — Z87891 Personal history of nicotine dependence: Secondary | ICD-10-CM | POA: Diagnosis not present

## 2022-09-08 DIAGNOSIS — C3412 Malignant neoplasm of upper lobe, left bronchus or lung: Secondary | ICD-10-CM | POA: Insufficient documentation

## 2022-09-08 DIAGNOSIS — Z95828 Presence of other vascular implants and grafts: Secondary | ICD-10-CM | POA: Insufficient documentation

## 2022-09-08 DIAGNOSIS — R1319 Other dysphagia: Secondary | ICD-10-CM | POA: Insufficient documentation

## 2022-09-08 DIAGNOSIS — Z510 Encounter for antineoplastic radiation therapy: Secondary | ICD-10-CM | POA: Diagnosis not present

## 2022-09-08 LAB — CMP (CANCER CENTER ONLY)
ALT: 18 U/L (ref 0–44)
AST: 15 U/L (ref 15–41)
Albumin: 3.3 g/dL — ABNORMAL LOW (ref 3.5–5.0)
Alkaline Phosphatase: 82 U/L (ref 38–126)
Anion gap: 8 (ref 5–15)
BUN: 17 mg/dL (ref 8–23)
CO2: 25 mmol/L (ref 22–32)
Calcium: 8.9 mg/dL (ref 8.9–10.3)
Chloride: 103 mmol/L (ref 98–111)
Creatinine: 0.74 mg/dL (ref 0.44–1.00)
GFR, Estimated: >60 mL/min (ref >60)
Glucose, Bld: 132 mg/dL — ABNORMAL HIGH (ref 70–99)
Potassium: 4.1 mmol/L (ref 3.5–5.1)
Sodium: 136 mmol/L (ref 135–145)
Total Bilirubin: 0.3 mg/dL (ref 0.3–1.2)
Total Protein: 6.7 g/dL (ref 6.5–8.1)

## 2022-09-08 LAB — CBC WITH DIFFERENTIAL (CANCER CENTER ONLY)
Abs Immature Granulocytes: 0.02 10*3/uL (ref 0.00–0.07)
Basophils Absolute: 0.1 10*3/uL (ref 0.0–0.1)
Basophils Relative: 1 %
Eosinophils Absolute: 0.2 10*3/uL (ref 0.0–0.5)
Eosinophils Relative: 3 %
HCT: 30.8 % — ABNORMAL LOW (ref 36.0–46.0)
Hemoglobin: 10.3 g/dL — ABNORMAL LOW (ref 12.0–15.0)
Immature Granulocytes: 0 %
Lymphocytes Relative: 7 %
Lymphs Abs: 0.3 10*3/uL — ABNORMAL LOW (ref 0.7–4.0)
MCH: 29.7 pg (ref 26.0–34.0)
MCHC: 33.4 g/dL (ref 30.0–36.0)
MCV: 88.8 fL (ref 80.0–100.0)
Monocytes Absolute: 0.4 10*3/uL (ref 0.1–1.0)
Monocytes Relative: 9 %
Neutro Abs: 4.1 10*3/uL (ref 1.7–7.7)
Neutrophils Relative %: 80 %
Platelet Count: 330 10*3/uL (ref 150–400)
RBC: 3.47 MIL/uL — ABNORMAL LOW (ref 3.87–5.11)
RDW: 14.1 % (ref 11.5–15.5)
WBC Count: 5.1 10*3/uL (ref 4.0–10.5)
nRBC: 0 % (ref 0.0–0.2)

## 2022-09-08 LAB — RAD ONC ARIA SESSION SUMMARY
Course Elapsed Days: 14
Plan Fractions Treated to Date: 11
Plan Prescribed Dose Per Fraction: 2 Gy
Plan Total Fractions Prescribed: 30
Plan Total Prescribed Dose: 60 Gy
Reference Point Dosage Given to Date: 22 Gy
Reference Point Session Dosage Given: 2 Gy
Session Number: 11

## 2022-09-08 MED ORDER — PALONOSETRON HCL INJECTION 0.25 MG/5ML
0.2500 mg | Freq: Once | INTRAVENOUS | Status: AC
  Administered 2022-09-08: 0.25 mg via INTRAVENOUS
  Filled 2022-09-08: qty 5

## 2022-09-08 MED ORDER — HEPARIN SOD (PORK) LOCK FLUSH 100 UNIT/ML IV SOLN
500.0000 [IU] | Freq: Once | INTRAVENOUS | Status: AC | PRN
  Administered 2022-09-08: 500 [IU]

## 2022-09-08 MED ORDER — SODIUM CHLORIDE 0.9 % IV SOLN
139.2000 mg | Freq: Once | INTRAVENOUS | Status: AC
  Administered 2022-09-08: 140 mg via INTRAVENOUS
  Filled 2022-09-08: qty 14

## 2022-09-08 MED ORDER — SODIUM CHLORIDE 0.9 % IV SOLN
45.0000 mg/m2 | Freq: Once | INTRAVENOUS | Status: AC
  Administered 2022-09-08: 72 mg via INTRAVENOUS
  Filled 2022-09-08: qty 12

## 2022-09-08 MED ORDER — DIPHENHYDRAMINE HCL 50 MG/ML IJ SOLN
50.0000 mg | Freq: Once | INTRAMUSCULAR | Status: AC
  Administered 2022-09-08: 50 mg via INTRAVENOUS
  Filled 2022-09-08: qty 1

## 2022-09-08 MED ORDER — SODIUM CHLORIDE 0.9 % IV SOLN
10.0000 mg | Freq: Once | INTRAVENOUS | Status: AC
  Administered 2022-09-08: 10 mg via INTRAVENOUS
  Filled 2022-09-08: qty 10

## 2022-09-08 MED ORDER — FAMOTIDINE IN NACL 20-0.9 MG/50ML-% IV SOLN
20.0000 mg | Freq: Once | INTRAVENOUS | Status: AC
  Administered 2022-09-08: 20 mg via INTRAVENOUS
  Filled 2022-09-08: qty 50

## 2022-09-08 MED ORDER — SODIUM CHLORIDE 0.9% FLUSH
10.0000 mL | INTRAVENOUS | Status: DC | PRN
  Administered 2022-09-08: 10 mL

## 2022-09-08 MED ORDER — SODIUM CHLORIDE 0.9 % IV SOLN: Freq: Once | INTRAVENOUS | Status: AC

## 2022-09-08 MED ORDER — SODIUM CHLORIDE 0.9% FLUSH
10.0000 mL | Freq: Once | INTRAVENOUS | Status: AC
  Administered 2022-09-08: 10 mL

## 2022-09-08 NOTE — Progress Notes (Signed)
Patient seen by Dr. Mohamed  Vitals are within treatment parameters.  Labs reviewed: and are within treatment parameters.  Per physician team, patient is ready for treatment and there are NO modifications to the treatment plan.  

## 2022-09-08 NOTE — Patient Instructions (Signed)
[facility]  Discharge Instructions: Thank you for choosing [facility] to provide your oncology and hematology care.   If you have a lab appointment with the Cancer Center, please go directly to the Cancer Center and check in at the registration area.   Wear comfortable clothing and clothing appropriate for easy access to any Portacath or PICC line.   We strive to give you quality time with your provider. You may need to reschedule your appointment if you arrive late (15 or more minutes).  Arriving late affects you and other patients whose appointments are after yours.  Also, if you miss three or more appointments without notifying the office, you may be dismissed from the clinic at the provider's discretion.      For prescription refill requests, have your pharmacy contact our office and allow 72 hours for refills to be completed.    Today you received the following chemotherapy and/or immunotherapy agents: Paclitaxel, Carboplatin.       To help prevent nausea and vomiting after your treatment, we encourage you to take your nausea medication as directed.  BELOW ARE SYMPTOMS THAT SHOULD BE REPORTED IMMEDIATELY: *FEVER GREATER THAN 100.4 F (38 C) OR HIGHER *CHILLS OR SWEATING *NAUSEA AND VOMITING THAT IS NOT CONTROLLED WITH YOUR NAUSEA MEDICATION *UNUSUAL SHORTNESS OF BREATH *UNUSUAL BRUISING OR BLEEDING *URINARY PROBLEMS (pain or burning when urinating, or frequent urination) *BOWEL PROBLEMS (unusual diarrhea, constipation, pain near the anus) TENDERNESS IN MOUTH AND THROAT WITH OR WITHOUT PRESENCE OF ULCERS (sore throat, sores in mouth, or a toothache) UNUSUAL RASH, SWELLING OR PAIN  UNUSUAL VAGINAL DISCHARGE OR ITCHING   Items with * indicate a potential emergency and should be followed up as soon as possible or go to the Emergency Department if any problems should occur.  Please show the CHEMOTHERAPY ALERT CARD or IMMUNOTHERAPY  ALERT CARD at check-in to the Emergency Department and triage nurse.  Should you have questions after your visit or need to cancel or reschedule your appointment, please contact [facility]  Dept: [phone]  and follow the prompts.  Office hours are 8:00 a.m. to 4:30 p.m. Monday - Friday. Please note that voicemails left after 4:00 p.m. may not be returned until the following business day.  We are closed weekends and major holidays. You have access to a nurse at all times for urgent questions. Please call the main number to the clinic Dept: [phone] and follow the prompts.   For any non-urgent questions, you may also contact your provider using MyChart. We now offer e-Visits for anyone 18 and older to request care online for non-urgent symptoms. For details visit [email or web address].   Also download the MyChart app! Go to the app store, search "MyChart", open the app, select [facility], and log in with your MyChart username and password.   

## 2022-09-08 NOTE — Progress Notes (Signed)
[facility] Telephone:([phone]   Fax:([phone]  OFFICE PROGRESS NOTE  Leamon Arnt, MD [place] [address]  DIAGNOSIS: Stage IIIB (T3, N2, M0) non-small cell lung cancer, squamous cell carcinoma presented with obstructive left lingular mass with consolidation and collapse of the lingula as well as part of the left upper lobe and likely invasion of the mediastinum diagnosed in February 2024.    PDL1: 5%   PRIOR THERAPY: None   CURRENT THERAPY: Concurrent chemoradiation with weekly carboplatin for AUC of 2 and paclitaxel 45 Mg/M2. Status post 3 weeks of treatment.   INTERVAL HISTORY: Evelyn Jordan 72 y.o. female returns to the clinic today for follow-up visit accompanied by her daughter Evelyn Jordan.  The patient is feeling fine today with no concerning complaints except for occasional nausea.  She also has cough that persisted and she is currently on breathing treatment by Dr. Lamonte Sakai.  She denied having any current chest pain, shortness of breath or hemoptysis.  She has no nausea, vomiting, diarrhea or constipation.  She has no headache or visual changes.  She is here today for evaluation before starting cycle #4 of her treatment.  MEDICAL HISTORY: Past Medical History:  Diagnosis Date   Depression    Insomnia    Osteoporosis    Pulmonary nodule    Thyroid nodule     ALLERGIES:  is allergic to compazine [prochlorperazine edisylate].  MEDICATIONS:  Current Outpatient Medications  Medication Sig Dispense Refill   albuterol (PROVENTIL) (2.5 MG/3ML) 0.083% nebulizer solution Take 3 mLs (2.5 mg total) by nebulization every 6 (six) hours as needed for wheezing or shortness of breath. 75 mL 12   albuterol (VENTOLIN HFA) 108 (90 Base) MCG/ACT inhaler Inhale 2 puffs into the lungs every 6 (six) hours as needed for wheezing or shortness of breath. 8 g 6   ALPRAZolam (XANAX) 0.5 MG tablet Take 1 tablet (0.5 mg total) by mouth at bedtime as  needed for anxiety or sleep. 30 tablet 5   docusate sodium (COLACE) 100 MG capsule Take 100 mg by mouth every other day.     HYDROcodone bit-homatropine (HYCODAN) 5-1.5 MG/5ML syrup Take 5 mLs by mouth every 6 (six) hours as needed for cough. 120 mL 0   lidocaine-prilocaine (EMLA) cream Apply 1 Application topically as needed. 30 g 2   ondansetron (ZOFRAN) 8 MG tablet Take 1 tablet (8 mg total) by mouth every 8 (eight) hours as needed for nausea or vomiting. 20 tablet 0   Tiotropium Bromide Monohydrate (SPIRIVA RESPIMAT) 2.5 MCG/ACT AERS Inhale 2 puffs into the lungs daily. 12 each 4   No current facility-administered medications for this visit.    SURGICAL HISTORY:  Past Surgical History:  Procedure Laterality Date   ANKLE SURGERY Right    BREAST BIOPSY Right    benign 2013   BRONCHIAL BIOPSY  08/04/2022   Procedure: BRONCHIAL BIOPSIES;  Surgeon: Collene Gobble, MD;  Location: [facility];  Service: Cardiopulmonary;;   BRONCHIAL BRUSHINGS  08/04/2022   Procedure: BRONCHIAL BRUSHINGS;  Surgeon: Collene Gobble, MD;  Location: [facility];  Service: Cardiopulmonary;;   HEMOSTASIS CONTROL  08/04/2022   Procedure: HEMOSTASIS CONTROL;  Surgeon: Collene Gobble, MD;  Location: [facility];  Service: Cardiopulmonary;;   IR IMAGING GUIDED PORT INSERTION  09/04/2022   repair of a torn meniscus Left 05/2018   VIDEO BRONCHOSCOPY Left 08/04/2022   Procedure: VIDEO BRONCHOSCOPY WITH FLUORO;  Surgeon: Baltazar Apo  S, MD;  Location: [facility];  Service: Cardiopulmonary;  Laterality: Left;    REVIEW OF SYSTEMS:  A comprehensive review of systems was negative except for: Respiratory: positive for cough Gastrointestinal: positive for nausea   PHYSICAL EXAMINATION: General appearance: alert, cooperative, fatigued, and no distress Head: Normocephalic, without obvious abnormality, atraumatic Neck: no adenopathy, no JVD, supple, symmetrical, trachea midline, and thyroid not enlarged, symmetric, no  tenderness/mass/nodules Lymph nodes: Cervical, supraclavicular, and axillary nodes normal. Resp: clear to auscultation bilaterally Back: symmetric, no curvature. ROM normal. No CVA tenderness. Cardio: regular rate and rhythm, S1, S2 normal, no murmur, click, rub or gallop GI: soft, non-tender; bowel sounds normal; no masses,  no organomegaly Extremities: extremities normal, atraumatic, no cyanosis or edema  ECOG PERFORMANCE STATUS: 1 - Symptomatic but completely ambulatory  Blood pressure 139/79, pulse 91, temperature 98.2 F (36.8 C), temperature source Oral, resp. rate 16, weight 122 lb 3.2 oz (55.4 kg), SpO2 100 %.  LABORATORY DATA: Lab Results  Component Value Date   WBC 5.1 09/08/2022   HGB 10.3 (L) 09/08/2022   HCT 30.8 (L) 09/08/2022   MCV 88.8 09/08/2022   PLT 330 09/08/2022      Chemistry      Component Value Date/Time   NA 137 09/01/2022 1224   NA 141 03/07/2015 0000   K 3.6 09/01/2022 1224   CL 102 09/01/2022 1224   CO2 27 09/01/2022 1224   BUN 10 09/01/2022 1224   CREATININE 0.69 09/01/2022 1224   GLU 103 03/07/2015 0000      Component Value Date/Time   CALCIUM 9.0 09/01/2022 1224   ALKPHOS 95 09/01/2022 1224   AST 18 09/01/2022 1224   ALT 23 09/01/2022 1224   BILITOT 0.3 09/01/2022 1224       RADIOGRAPHIC STUDIES: IR IMAGING GUIDED PORT INSERTION  Result Date: 09/04/2022 CLINICAL DATA:  Lung cancer, access for chemotherapy EXAM: RIGHT INTERNAL JUGULAR SINGLE LUMEN POWER PORT CATHETER INSERTION Date:  09/04/2022 09/04/2022 11:53 am Radiologist:  Jerilynn Mages. Daryll Brod, MD Guidance:  Ultrasound and fluoroscopic MEDICATIONS: 1% lidocaine local with epinephrine ANESTHESIA/SEDATION: Versed 2.0 mg IV; Fentanyl 100 mcg IV; Benadryl 25 mg IV Moderate Sedation Time:  27 minute The patient was continuously monitored during the procedure by the interventional radiology nurse under my direct supervision. FLUOROSCOPY: 0 minutes, 42 seconds (1 mGy) COMPLICATIONS: None immediate.  CONTRAST:  None. PROCEDURE: Informed consent was obtained from the patient following explanation of the procedure, risks, benefits and alternatives. The patient understands, agrees and consents for the procedure. All questions were addressed. A time out was performed. Maximal barrier sterile technique utilized including caps, mask, sterile gowns, sterile gloves, large sterile drape, hand hygiene, and 2% chlorhexidine scrub. Under sterile conditions and local anesthesia, right internal jugular micropuncture venous access was performed. Access was performed with ultrasound. Images were obtained for documentation of the patent right internal jugular vein. A guide wire was inserted followed by a transitional dilator. This allowed insertion of a guide wire and catheter into the IVC. Measurements were obtained from the SVC / RA junction back to the right IJ venotomy site. In the right infraclavicular chest, a subcutaneous pocket was created over the second anterior rib. This was done under sterile conditions and local anesthesia. 1% lidocaine with epinephrine was utilized for this. A 2.5 cm incision was made in the skin. Blunt dissection was performed to create a subcutaneous pocket over the right pectoralis major muscle. The pocket was flushed with saline vigorously. There was adequate hemostasis. The  port catheter was assembled and checked for leakage. The port catheter was secured in the pocket with two retention sutures. The tubing was tunneled subcutaneously to the right venotomy site and inserted into the SVC/RA junction through a valved peel-away sheath. Position was confirmed with fluoroscopy. Images were obtained for documentation. The patient tolerated the procedure well. No immediate complications. Incisions were closed in a two layer fashion with 4 - 0 Vicryl suture. Dermabond was applied to the skin. The port catheter was accessed, blood was aspirated followed by saline and heparin flushes. Needle was  removed. A dry sterile dressing was applied. IMPRESSION: Ultrasound and fluoroscopically guided right internal jugular single lumen power port catheter insertion. Tip in the SVC/RA junction. Catheter ready for use. Electronically Signed   By: Jerilynn Mages.  Shick M.D.   On: 09/04/2022 12:22   MR BRAIN W WO CONTRAST  Result Date: 08/20/2022 CLINICAL DATA:  Non-small cell lung cancer.  Staging.  Intra-axial EXAM: MRI HEAD WITHOUT AND WITH CONTRAST TECHNIQUE: Multiplanar, multiecho pulse sequences of the brain and surrounding structures were obtained without and with intravenous contrast. CONTRAST:  5.11mL GADAVIST GADOBUTROL 1 MMOL/ML IV SOLN COMPARISON:  None Available. FINDINGS: Brain: The brain has a normal appearance without evidence of malformation, atrophy, old or acute small or large vessel infarction, mass lesion, hemorrhage, hydrocephalus or extra-axial collection. Incidental small meningioma at the right lateral frontal convexity with a diameter of 11 mm and maximal thickness of 5 mm. No mass-effect upon the brain. This should not be of clinical relevance. Vascular: Major vessels at the base of the brain show flow. Venous sinuses appear patent. Skull and upper cervical spine: Normal. Sinuses/Orbits: Clear/normal. Other: None significant. IMPRESSION: 1. Normal appearance of the brain itself. No evidence of metastatic disease. 2. Incidental small meningioma at the right lateral frontal convexity measuring 11 x 5 mm. No mass-effect upon the brain. This should not be of clinical relevance. Electronically Signed   By: Nelson Chimes M.D.   On: 08/20/2022 11:17    ASSESSMENT AND PLAN: This is a very pleasant 72 years old white female with Stage IIIB (T3, N2, M0) non-small cell lung cancer, squamous cell carcinoma presented with obstructive left lingular mass with consolidation and collapse of the lingula as well as part of the left upper lobe and likely invasion of the mediastinum diagnosed in February 2024.  PDL1:  5% The patient is currently undergoing a course of concurrent chemoradiation with weekly carboplatin for AUC of 2 and paclitaxel 45 Mg/M2 status post 3 cycles.  She has been tolerating this treatment well with no concerning adverse effects. I recommended for her to proceed with cycle #4 today as planned. I will see her back for follow-up visit in 2 weeks for evaluation before starting cycle #6. She was advised to call immediately if she has any concerning symptoms in the interval. The patient voices understanding of current disease status and treatment options and is in agreement with the current care plan.  All questions were answered. The patient knows to call the clinic with any problems, questions or concerns. We can certainly see the patient much sooner if necessary.  The total time spent in the appointment was 20 minutes.  Disclaimer: This note was dictated with voice recognition software. Similar sounding words can inadvertently be transcribed and may not be corrected upon review.

## 2022-09-09 ENCOUNTER — Other Ambulatory Visit: Payer: Self-pay

## 2022-09-09 ENCOUNTER — Ambulatory Visit
Admission: RE | Admit: 2022-09-09 | Discharge: 2022-09-09 | Disposition: A | Discharge status: Home / Self Care | Payer: Medicare Other | Source: Ambulatory Visit | Attending: Radiation Oncology | Admitting: Radiation Oncology

## 2022-09-09 DIAGNOSIS — Z87891 Personal history of nicotine dependence: Secondary | ICD-10-CM | POA: Diagnosis not present

## 2022-09-09 DIAGNOSIS — C3412 Malignant neoplasm of upper lobe, left bronchus or lung: Secondary | ICD-10-CM | POA: Diagnosis not present

## 2022-09-09 DIAGNOSIS — R1319 Other dysphagia: Secondary | ICD-10-CM | POA: Diagnosis not present

## 2022-09-09 DIAGNOSIS — Z5111 Encounter for antineoplastic chemotherapy: Secondary | ICD-10-CM | POA: Diagnosis not present

## 2022-09-09 DIAGNOSIS — Z510 Encounter for antineoplastic radiation therapy: Secondary | ICD-10-CM | POA: Diagnosis not present

## 2022-09-09 LAB — RAD ONC ARIA SESSION SUMMARY
Course Elapsed Days: 15
Plan Fractions Treated to Date: 12
Plan Prescribed Dose Per Fraction: 2 Gy
Plan Total Fractions Prescribed: 30
Plan Total Prescribed Dose: 60 Gy
Reference Point Dosage Given to Date: 24 Gy
Reference Point Session Dosage Given: 2 Gy
Session Number: 12

## 2022-09-10 ENCOUNTER — Other Ambulatory Visit: Payer: Self-pay

## 2022-09-10 ENCOUNTER — Ambulatory Visit
Admission: RE | Admit: 2022-09-10 | Discharge: 2022-09-10 | Disposition: A | Discharge status: Home / Self Care | Payer: Medicare Other | Source: Ambulatory Visit | Attending: Radiation Oncology | Admitting: Radiation Oncology

## 2022-09-10 DIAGNOSIS — Z510 Encounter for antineoplastic radiation therapy: Secondary | ICD-10-CM | POA: Diagnosis not present

## 2022-09-10 DIAGNOSIS — C3412 Malignant neoplasm of upper lobe, left bronchus or lung: Secondary | ICD-10-CM | POA: Diagnosis not present

## 2022-09-10 DIAGNOSIS — Z87891 Personal history of nicotine dependence: Secondary | ICD-10-CM | POA: Diagnosis not present

## 2022-09-10 DIAGNOSIS — R1319 Other dysphagia: Secondary | ICD-10-CM | POA: Diagnosis not present

## 2022-09-10 DIAGNOSIS — Z5111 Encounter for antineoplastic chemotherapy: Secondary | ICD-10-CM | POA: Diagnosis not present

## 2022-09-10 LAB — RAD ONC ARIA SESSION SUMMARY
Course Elapsed Days: 16
Plan Fractions Treated to Date: 13
Plan Prescribed Dose Per Fraction: 2 Gy
Plan Total Fractions Prescribed: 30
Plan Total Prescribed Dose: 60 Gy
Reference Point Dosage Given to Date: 26 Gy
Reference Point Session Dosage Given: 2 Gy
Session Number: 13

## 2022-09-11 ENCOUNTER — Ambulatory Visit
Admission: RE | Admit: 2022-09-11 | Discharge: 2022-09-11 | Disposition: A | Discharge status: Home / Self Care | Payer: Medicare Other | Source: Ambulatory Visit | Attending: Radiation Oncology | Admitting: Radiation Oncology

## 2022-09-11 ENCOUNTER — Encounter: Payer: Self-pay | Admitting: Internal Medicine

## 2022-09-11 ENCOUNTER — Other Ambulatory Visit: Payer: Self-pay

## 2022-09-11 ENCOUNTER — Other Ambulatory Visit (HOSPITAL_COMMUNITY): Payer: Self-pay

## 2022-09-11 DIAGNOSIS — Z5111 Encounter for antineoplastic chemotherapy: Secondary | ICD-10-CM | POA: Diagnosis not present

## 2022-09-11 DIAGNOSIS — C3412 Malignant neoplasm of upper lobe, left bronchus or lung: Secondary | ICD-10-CM | POA: Diagnosis not present

## 2022-09-11 DIAGNOSIS — R1319 Other dysphagia: Secondary | ICD-10-CM | POA: Diagnosis not present

## 2022-09-11 DIAGNOSIS — Z87891 Personal history of nicotine dependence: Secondary | ICD-10-CM | POA: Diagnosis not present

## 2022-09-11 DIAGNOSIS — Z510 Encounter for antineoplastic radiation therapy: Secondary | ICD-10-CM | POA: Diagnosis not present

## 2022-09-11 LAB — RAD ONC ARIA SESSION SUMMARY
Course Elapsed Days: 17
Plan Fractions Treated to Date: 14
Plan Prescribed Dose Per Fraction: 2 Gy
Plan Total Fractions Prescribed: 30
Plan Total Prescribed Dose: 60 Gy
Reference Point Dosage Given to Date: 28 Gy
Reference Point Session Dosage Given: 2 Gy
Session Number: 14

## 2022-09-11 NOTE — Telephone Encounter (Signed)
Incruse is showing as a non-covered product under the patients plan at this time.

## 2022-09-12 ENCOUNTER — Ambulatory Visit
Admission: RE | Admit: 2022-09-12 | Discharge: 2022-09-12 | Disposition: A | Discharge status: Home / Self Care | Payer: Medicare Other | Source: Ambulatory Visit | Attending: Radiation Oncology | Admitting: Radiation Oncology

## 2022-09-12 ENCOUNTER — Other Ambulatory Visit: Payer: Self-pay

## 2022-09-12 ENCOUNTER — Encounter: Payer: Self-pay | Admitting: Internal Medicine

## 2022-09-12 ENCOUNTER — Telehealth: Payer: Self-pay | Admitting: *Deleted

## 2022-09-12 ENCOUNTER — Other Ambulatory Visit: Payer: Self-pay | Admitting: Radiation Oncology

## 2022-09-12 DIAGNOSIS — C3412 Malignant neoplasm of upper lobe, left bronchus or lung: Secondary | ICD-10-CM | POA: Diagnosis not present

## 2022-09-12 DIAGNOSIS — Z510 Encounter for antineoplastic radiation therapy: Secondary | ICD-10-CM | POA: Diagnosis not present

## 2022-09-12 DIAGNOSIS — C3490 Malignant neoplasm of unspecified part of unspecified bronchus or lung: Secondary | ICD-10-CM

## 2022-09-12 DIAGNOSIS — Z87891 Personal history of nicotine dependence: Secondary | ICD-10-CM | POA: Diagnosis not present

## 2022-09-12 DIAGNOSIS — R1319 Other dysphagia: Secondary | ICD-10-CM | POA: Diagnosis not present

## 2022-09-12 DIAGNOSIS — Z5111 Encounter for antineoplastic chemotherapy: Secondary | ICD-10-CM | POA: Diagnosis not present

## 2022-09-12 LAB — RAD ONC ARIA SESSION SUMMARY
Course Elapsed Days: 18
Plan Fractions Treated to Date: 15
Plan Prescribed Dose Per Fraction: 2 Gy
Plan Total Fractions Prescribed: 30
Plan Total Prescribed Dose: 60 Gy
Reference Point Dosage Given to Date: 30 Gy
Reference Point Session Dosage Given: 2 Gy
Session Number: 15

## 2022-09-12 MED ORDER — RADIAPLEXRX EX GEL: Freq: Once | CUTANEOUS | Status: DC

## 2022-09-12 MED ORDER — SUCRALFATE 1 G PO TABS: 1.0000 g | ORAL_TABLET | Freq: Three times a day (TID) | ORAL | 2 refills | Status: DC

## 2022-09-12 MED ORDER — SONAFINE EX EMUL
1.0000 | Freq: Once | CUTANEOUS | Status: AC
  Administered 2022-09-12: 1 via TOPICAL

## 2022-09-12 NOTE — Telephone Encounter (Signed)
RETURNED PATIENT'S PHONE CALL, SPOKE WITH PATIENT. ?

## 2022-09-15 ENCOUNTER — Other Ambulatory Visit: Payer: Medicare Other

## 2022-09-15 ENCOUNTER — Other Ambulatory Visit: Payer: Self-pay

## 2022-09-15 ENCOUNTER — Ambulatory Visit: Payer: Medicare Other

## 2022-09-15 ENCOUNTER — Inpatient Hospital Stay: Payer: Medicare Other

## 2022-09-15 ENCOUNTER — Ambulatory Visit: Payer: Medicare Other | Admitting: Internal Medicine

## 2022-09-15 ENCOUNTER — Other Ambulatory Visit: Payer: Self-pay | Admitting: Internal Medicine

## 2022-09-15 ENCOUNTER — Ambulatory Visit
Admission: RE | Admit: 2022-09-15 | Discharge: 2022-09-15 | Disposition: A | Discharge status: Home / Self Care | Payer: Medicare Other | Source: Ambulatory Visit | Attending: Radiation Oncology | Admitting: Radiation Oncology

## 2022-09-15 VITALS — BP 104/71 | HR 86 | Temp 98.0°F | Resp 17 | Wt 120.8 lb

## 2022-09-15 DIAGNOSIS — C3490 Malignant neoplasm of unspecified part of unspecified bronchus or lung: Secondary | ICD-10-CM

## 2022-09-15 DIAGNOSIS — R1319 Other dysphagia: Secondary | ICD-10-CM | POA: Diagnosis not present

## 2022-09-15 DIAGNOSIS — Z95828 Presence of other vascular implants and grafts: Secondary | ICD-10-CM

## 2022-09-15 DIAGNOSIS — Z5111 Encounter for antineoplastic chemotherapy: Secondary | ICD-10-CM | POA: Diagnosis not present

## 2022-09-15 DIAGNOSIS — Z87891 Personal history of nicotine dependence: Secondary | ICD-10-CM | POA: Diagnosis not present

## 2022-09-15 DIAGNOSIS — Z510 Encounter for antineoplastic radiation therapy: Secondary | ICD-10-CM | POA: Diagnosis not present

## 2022-09-15 DIAGNOSIS — C3412 Malignant neoplasm of upper lobe, left bronchus or lung: Secondary | ICD-10-CM | POA: Diagnosis not present

## 2022-09-15 LAB — CBC WITH DIFFERENTIAL (CANCER CENTER ONLY)
Abs Immature Granulocytes: 0.02 10*3/uL (ref 0.00–0.07)
Basophils Absolute: 0 10*3/uL (ref 0.0–0.1)
Basophils Relative: 1 %
Eosinophils Absolute: 0.1 10*3/uL (ref 0.0–0.5)
Eosinophils Relative: 2 %
HCT: 32.8 % — ABNORMAL LOW (ref 36.0–46.0)
Hemoglobin: 11 g/dL — ABNORMAL LOW (ref 12.0–15.0)
Immature Granulocytes: 1 %
Lymphocytes Relative: 6 %
Lymphs Abs: 0.3 10*3/uL — ABNORMAL LOW (ref 0.7–4.0)
MCH: 29.8 pg (ref 26.0–34.0)
MCHC: 33.5 g/dL (ref 30.0–36.0)
MCV: 88.9 fL (ref 80.0–100.0)
Monocytes Absolute: 0.5 10*3/uL (ref 0.1–1.0)
Monocytes Relative: 11 %
Neutro Abs: 3.4 10*3/uL (ref 1.7–7.7)
Neutrophils Relative %: 79 %
Platelet Count: 219 10*3/uL (ref 150–400)
RBC: 3.69 MIL/uL — ABNORMAL LOW (ref 3.87–5.11)
RDW: 14.6 % (ref 11.5–15.5)
WBC Count: 4.3 10*3/uL (ref 4.0–10.5)
nRBC: 0 % (ref 0.0–0.2)

## 2022-09-15 LAB — RAD ONC ARIA SESSION SUMMARY
Course Elapsed Days: 21
Plan Fractions Treated to Date: 16
Plan Prescribed Dose Per Fraction: 2 Gy
Plan Total Fractions Prescribed: 30
Plan Total Prescribed Dose: 60 Gy
Reference Point Dosage Given to Date: 32 Gy
Reference Point Session Dosage Given: 2 Gy
Session Number: 16

## 2022-09-15 LAB — CMP (CANCER CENTER ONLY)
ALT: 16 U/L (ref 0–44)
AST: 14 U/L — ABNORMAL LOW (ref 15–41)
Albumin: 3.4 g/dL — ABNORMAL LOW (ref 3.5–5.0)
Alkaline Phosphatase: 65 U/L (ref 38–126)
Anion gap: 5 (ref 5–15)
BUN: 10 mg/dL (ref 8–23)
CO2: 27 mmol/L (ref 22–32)
Calcium: 8.9 mg/dL (ref 8.9–10.3)
Chloride: 104 mmol/L (ref 98–111)
Creatinine: 0.69 mg/dL (ref 0.44–1.00)
GFR, Estimated: >60 mL/min (ref >60)
Glucose, Bld: 91 mg/dL (ref 70–99)
Potassium: 4.2 mmol/L (ref 3.5–5.1)
Sodium: 136 mmol/L (ref 135–145)
Total Bilirubin: 0.3 mg/dL (ref 0.3–1.2)
Total Protein: 6.5 g/dL (ref 6.5–8.1)

## 2022-09-15 MED ORDER — SODIUM CHLORIDE 0.9 % IV SOLN: Freq: Once | INTRAVENOUS | Status: AC

## 2022-09-15 MED ORDER — DIPHENHYDRAMINE HCL 50 MG/ML IJ SOLN
50.0000 mg | Freq: Once | INTRAMUSCULAR | Status: AC
  Administered 2022-09-15: 50 mg via INTRAVENOUS
  Filled 2022-09-15: qty 1

## 2022-09-15 MED ORDER — FAMOTIDINE IN NACL 20-0.9 MG/50ML-% IV SOLN
20.0000 mg | Freq: Once | INTRAVENOUS | Status: AC
  Administered 2022-09-15: 20 mg via INTRAVENOUS
  Filled 2022-09-15: qty 50

## 2022-09-15 MED ORDER — SODIUM CHLORIDE 0.9% FLUSH
10.0000 mL | Freq: Once | INTRAVENOUS | Status: AC
  Administered 2022-09-15: 10 mL

## 2022-09-15 MED ORDER — HEPARIN SOD (PORK) LOCK FLUSH 100 UNIT/ML IV SOLN
500.0000 [IU] | Freq: Once | INTRAVENOUS | Status: AC | PRN
  Administered 2022-09-15: 500 [IU]

## 2022-09-15 MED ORDER — SODIUM CHLORIDE 0.9 % IV SOLN
10.0000 mg | Freq: Once | INTRAVENOUS | Status: AC
  Administered 2022-09-15: 10 mg via INTRAVENOUS
  Filled 2022-09-15: qty 10

## 2022-09-15 MED ORDER — SODIUM CHLORIDE 0.9 % IV SOLN
140.0000 mg | Freq: Once | INTRAVENOUS | Status: AC
  Administered 2022-09-15: 140 mg via INTRAVENOUS
  Filled 2022-09-15: qty 14

## 2022-09-15 MED ORDER — SODIUM CHLORIDE 0.9% FLUSH
10.0000 mL | INTRAVENOUS | Status: DC | PRN
  Administered 2022-09-15: 10 mL

## 2022-09-15 MED ORDER — PALONOSETRON HCL INJECTION 0.25 MG/5ML
0.2500 mg | Freq: Once | INTRAVENOUS | Status: AC
  Administered 2022-09-15: 0.25 mg via INTRAVENOUS
  Filled 2022-09-15: qty 5

## 2022-09-15 MED ORDER — SODIUM CHLORIDE 0.9 % IV SOLN
45.0000 mg/m2 | Freq: Once | INTRAVENOUS | Status: AC
  Administered 2022-09-15: 72 mg via INTRAVENOUS
  Filled 2022-09-15: qty 12

## 2022-09-15 NOTE — Progress Notes (Signed)
Patient prefers for Benadryl 50mg  with her treatments. Orders increased as requested. Orders changed for future treatments as well.  Anola Gurney [place], [place], BCPS, BCOP 09/15/2022 1:44 PM

## 2022-09-16 ENCOUNTER — Other Ambulatory Visit: Payer: Self-pay

## 2022-09-16 ENCOUNTER — Ambulatory Visit
Admission: RE | Admit: 2022-09-16 | Discharge: 2022-09-16 | Disposition: A | Discharge status: Home / Self Care | Payer: Medicare Other | Source: Ambulatory Visit | Attending: Radiation Oncology | Admitting: Radiation Oncology

## 2022-09-16 DIAGNOSIS — R1319 Other dysphagia: Secondary | ICD-10-CM | POA: Diagnosis not present

## 2022-09-16 DIAGNOSIS — Z87891 Personal history of nicotine dependence: Secondary | ICD-10-CM | POA: Diagnosis not present

## 2022-09-16 DIAGNOSIS — Z5111 Encounter for antineoplastic chemotherapy: Secondary | ICD-10-CM | POA: Diagnosis not present

## 2022-09-16 DIAGNOSIS — Z510 Encounter for antineoplastic radiation therapy: Secondary | ICD-10-CM | POA: Diagnosis not present

## 2022-09-16 DIAGNOSIS — C3412 Malignant neoplasm of upper lobe, left bronchus or lung: Secondary | ICD-10-CM | POA: Diagnosis not present

## 2022-09-16 LAB — RAD ONC ARIA SESSION SUMMARY
Course Elapsed Days: 22
Plan Fractions Treated to Date: 17
Plan Prescribed Dose Per Fraction: 2 Gy
Plan Total Fractions Prescribed: 30
Plan Total Prescribed Dose: 60 Gy
Reference Point Dosage Given to Date: 34 Gy
Reference Point Session Dosage Given: 2 Gy
Session Number: 17

## 2022-09-17 ENCOUNTER — Other Ambulatory Visit: Payer: Self-pay

## 2022-09-17 ENCOUNTER — Ambulatory Visit
Admission: RE | Admit: 2022-09-17 | Discharge: 2022-09-17 | Disposition: A | Discharge status: Home / Self Care | Payer: Medicare Other | Source: Ambulatory Visit | Attending: Radiation Oncology | Admitting: Radiation Oncology

## 2022-09-17 ENCOUNTER — Other Ambulatory Visit (HOSPITAL_COMMUNITY): Payer: Self-pay

## 2022-09-17 DIAGNOSIS — Z5111 Encounter for antineoplastic chemotherapy: Secondary | ICD-10-CM | POA: Diagnosis not present

## 2022-09-17 DIAGNOSIS — Z87891 Personal history of nicotine dependence: Secondary | ICD-10-CM | POA: Diagnosis not present

## 2022-09-17 DIAGNOSIS — Z510 Encounter for antineoplastic radiation therapy: Secondary | ICD-10-CM | POA: Diagnosis not present

## 2022-09-17 DIAGNOSIS — R1319 Other dysphagia: Secondary | ICD-10-CM | POA: Diagnosis not present

## 2022-09-17 DIAGNOSIS — C3412 Malignant neoplasm of upper lobe, left bronchus or lung: Secondary | ICD-10-CM | POA: Diagnosis not present

## 2022-09-17 LAB — RAD ONC ARIA SESSION SUMMARY
Course Elapsed Days: 23
Plan Fractions Treated to Date: 18
Plan Prescribed Dose Per Fraction: 2 Gy
Plan Total Fractions Prescribed: 30
Plan Total Prescribed Dose: 60 Gy
Reference Point Dosage Given to Date: 36 Gy
Reference Point Session Dosage Given: 2 Gy
Session Number: 18

## 2022-09-17 NOTE — Telephone Encounter (Signed)
Spiriva is what we ordered originally. The cost will be high until she meets her meds deductible.

## 2022-09-17 NOTE — Telephone Encounter (Signed)
Alternatives covered are:   Spiriva Respimat, Brand Spiriva Handihaler  It also seems patient has a deductible to meet that may contribute to higher co-pays

## 2022-09-18 ENCOUNTER — Ambulatory Visit
Admission: RE | Admit: 2022-09-18 | Discharge: 2022-09-18 | Disposition: A | Discharge status: Home / Self Care | Payer: Medicare Other | Source: Ambulatory Visit | Attending: Radiation Oncology | Admitting: Radiation Oncology

## 2022-09-18 ENCOUNTER — Other Ambulatory Visit: Payer: Self-pay

## 2022-09-18 DIAGNOSIS — R1319 Other dysphagia: Secondary | ICD-10-CM | POA: Diagnosis not present

## 2022-09-18 DIAGNOSIS — C3412 Malignant neoplasm of upper lobe, left bronchus or lung: Secondary | ICD-10-CM | POA: Diagnosis not present

## 2022-09-18 DIAGNOSIS — Z5111 Encounter for antineoplastic chemotherapy: Secondary | ICD-10-CM | POA: Diagnosis not present

## 2022-09-18 DIAGNOSIS — Z510 Encounter for antineoplastic radiation therapy: Secondary | ICD-10-CM | POA: Diagnosis not present

## 2022-09-18 DIAGNOSIS — Z87891 Personal history of nicotine dependence: Secondary | ICD-10-CM | POA: Diagnosis not present

## 2022-09-18 LAB — RAD ONC ARIA SESSION SUMMARY
Course Elapsed Days: 24
Plan Fractions Treated to Date: 19
Plan Prescribed Dose Per Fraction: 2 Gy
Plan Total Fractions Prescribed: 30
Plan Total Prescribed Dose: 60 Gy
Reference Point Dosage Given to Date: 38 Gy
Reference Point Session Dosage Given: 2 Gy
Session Number: 19

## 2022-09-19 ENCOUNTER — Telehealth: Payer: Self-pay | Admitting: Emergency Medicine

## 2022-09-19 ENCOUNTER — Other Ambulatory Visit: Payer: Self-pay

## 2022-09-19 ENCOUNTER — Ambulatory Visit
Admission: RE | Admit: 2022-09-19 | Discharge: 2022-09-19 | Disposition: A | Discharge status: Home / Self Care | Payer: Medicare Other | Source: Ambulatory Visit | Attending: Radiation Oncology | Admitting: Radiation Oncology

## 2022-09-19 DIAGNOSIS — Z87891 Personal history of nicotine dependence: Secondary | ICD-10-CM | POA: Diagnosis not present

## 2022-09-19 DIAGNOSIS — Z5111 Encounter for antineoplastic chemotherapy: Secondary | ICD-10-CM | POA: Diagnosis not present

## 2022-09-19 DIAGNOSIS — R1319 Other dysphagia: Secondary | ICD-10-CM | POA: Diagnosis not present

## 2022-09-19 DIAGNOSIS — Z510 Encounter for antineoplastic radiation therapy: Secondary | ICD-10-CM | POA: Diagnosis not present

## 2022-09-19 DIAGNOSIS — C3412 Malignant neoplasm of upper lobe, left bronchus or lung: Secondary | ICD-10-CM | POA: Diagnosis not present

## 2022-09-19 LAB — RAD ONC ARIA SESSION SUMMARY
Course Elapsed Days: 25
Plan Fractions Treated to Date: 20
Plan Prescribed Dose Per Fraction: 2 Gy
Plan Total Fractions Prescribed: 30
Plan Total Prescribed Dose: 60 Gy
Reference Point Dosage Given to Date: 40 Gy
Reference Point Session Dosage Given: 2 Gy
Session Number: 20

## 2022-09-19 MED FILL — Dexamethasone Sodium Phosphate Inj 100 MG/10ML: INTRAMUSCULAR | Qty: 1 | Status: AC

## 2022-09-19 NOTE — Telephone Encounter (Signed)
Miranda states needs diagnosis code for Albuterol solution. Miranda phone number is [phone].

## 2022-09-22 ENCOUNTER — Inpatient Hospital Stay: Payer: Medicare Other

## 2022-09-22 ENCOUNTER — Ambulatory Visit
Admission: RE | Admit: 2022-09-22 | Discharge: 2022-09-22 | Disposition: A | Discharge status: Home / Self Care | Payer: Medicare Other | Source: Ambulatory Visit | Attending: Radiation Oncology | Admitting: Radiation Oncology

## 2022-09-22 ENCOUNTER — Other Ambulatory Visit: Payer: Medicare Other

## 2022-09-22 ENCOUNTER — Inpatient Hospital Stay (HOSPITAL_BASED_OUTPATIENT_CLINIC_OR_DEPARTMENT_OTHER): Payer: Medicare Other | Admitting: Internal Medicine

## 2022-09-22 ENCOUNTER — Other Ambulatory Visit: Payer: Self-pay

## 2022-09-22 ENCOUNTER — Encounter: Payer: Self-pay | Admitting: Medical Oncology

## 2022-09-22 VITALS — BP 124/83 | HR 86 | Temp 98.2°F | Resp 16 | Wt 121.2 lb

## 2022-09-22 DIAGNOSIS — R1319 Other dysphagia: Secondary | ICD-10-CM | POA: Diagnosis not present

## 2022-09-22 DIAGNOSIS — C3412 Malignant neoplasm of upper lobe, left bronchus or lung: Secondary | ICD-10-CM | POA: Diagnosis not present

## 2022-09-22 DIAGNOSIS — C3490 Malignant neoplasm of unspecified part of unspecified bronchus or lung: Secondary | ICD-10-CM

## 2022-09-22 DIAGNOSIS — Z95828 Presence of other vascular implants and grafts: Secondary | ICD-10-CM

## 2022-09-22 DIAGNOSIS — Z87891 Personal history of nicotine dependence: Secondary | ICD-10-CM | POA: Diagnosis not present

## 2022-09-22 DIAGNOSIS — Z5111 Encounter for antineoplastic chemotherapy: Secondary | ICD-10-CM | POA: Diagnosis not present

## 2022-09-22 DIAGNOSIS — Z510 Encounter for antineoplastic radiation therapy: Secondary | ICD-10-CM | POA: Diagnosis not present

## 2022-09-22 LAB — CBC WITH DIFFERENTIAL (CANCER CENTER ONLY)
Abs Immature Granulocytes: 0.02 10*3/uL (ref 0.00–0.07)
Basophils Absolute: 0 10*3/uL (ref 0.0–0.1)
Basophils Relative: 1 %
Eosinophils Absolute: 0 10*3/uL (ref 0.0–0.5)
Eosinophils Relative: 1 %
HCT: 31.1 % — ABNORMAL LOW (ref 36.0–46.0)
Hemoglobin: 10.5 g/dL — ABNORMAL LOW (ref 12.0–15.0)
Immature Granulocytes: 1 %
Lymphocytes Relative: 7 %
Lymphs Abs: 0.2 10*3/uL — ABNORMAL LOW (ref 0.7–4.0)
MCH: 30.2 pg (ref 26.0–34.0)
MCHC: 33.8 g/dL (ref 30.0–36.0)
MCV: 89.4 fL (ref 80.0–100.0)
Monocytes Absolute: 0.2 10*3/uL (ref 0.1–1.0)
Monocytes Relative: 8 %
Neutro Abs: 2.5 10*3/uL (ref 1.7–7.7)
Neutrophils Relative %: 82 %
Platelet Count: 169 10*3/uL (ref 150–400)
RBC: 3.48 MIL/uL — ABNORMAL LOW (ref 3.87–5.11)
RDW: 16 % — ABNORMAL HIGH (ref 11.5–15.5)
WBC Count: 3.1 10*3/uL — ABNORMAL LOW (ref 4.0–10.5)
nRBC: 0 % (ref 0.0–0.2)

## 2022-09-22 LAB — CMP (CANCER CENTER ONLY)
ALT: 20 U/L (ref 0–44)
AST: 18 U/L (ref 15–41)
Albumin: 3.3 g/dL — ABNORMAL LOW (ref 3.5–5.0)
Alkaline Phosphatase: 58 U/L (ref 38–126)
Anion gap: 4 — ABNORMAL LOW (ref 5–15)
BUN: 21 mg/dL (ref 8–23)
CO2: 27 mmol/L (ref 22–32)
Calcium: 9 mg/dL (ref 8.9–10.3)
Chloride: 108 mmol/L (ref 98–111)
Creatinine: 0.67 mg/dL (ref 0.44–1.00)
GFR, Estimated: >60 mL/min (ref >60)
Glucose, Bld: 96 mg/dL (ref 70–99)
Potassium: 4.1 mmol/L (ref 3.5–5.1)
Sodium: 139 mmol/L (ref 135–145)
Total Bilirubin: 0.3 mg/dL (ref 0.3–1.2)
Total Protein: 6.3 g/dL — ABNORMAL LOW (ref 6.5–8.1)

## 2022-09-22 LAB — RAD ONC ARIA SESSION SUMMARY
Course Elapsed Days: 28
Plan Fractions Treated to Date: 21
Plan Prescribed Dose Per Fraction: 2 Gy
Plan Total Fractions Prescribed: 30
Plan Total Prescribed Dose: 60 Gy
Reference Point Dosage Given to Date: 42 Gy
Reference Point Session Dosage Given: 2 Gy
Session Number: 21

## 2022-09-22 MED ORDER — SODIUM CHLORIDE 0.9% FLUSH
10.0000 mL | Freq: Once | INTRAVENOUS | Status: AC
  Administered 2022-09-22: 10 mL

## 2022-09-22 MED ORDER — FAMOTIDINE IN NACL 20-0.9 MG/50ML-% IV SOLN
20.0000 mg | Freq: Once | INTRAVENOUS | Status: AC
  Administered 2022-09-22: 20 mg via INTRAVENOUS
  Filled 2022-09-22: qty 50

## 2022-09-22 MED ORDER — HEPARIN SOD (PORK) LOCK FLUSH 100 UNIT/ML IV SOLN
500.0000 [IU] | Freq: Once | INTRAVENOUS | Status: AC | PRN
  Administered 2022-09-22: 500 [IU]

## 2022-09-22 MED ORDER — SODIUM CHLORIDE 0.9 % IV SOLN
10.0000 mg | Freq: Once | INTRAVENOUS | Status: AC
  Administered 2022-09-22: 10 mg via INTRAVENOUS
  Filled 2022-09-22: qty 10

## 2022-09-22 MED ORDER — SODIUM CHLORIDE 0.9% FLUSH
10.0000 mL | INTRAVENOUS | Status: DC | PRN
  Administered 2022-09-22: 10 mL

## 2022-09-22 MED ORDER — SODIUM CHLORIDE 0.9 % IV SOLN
45.0000 mg/m2 | Freq: Once | INTRAVENOUS | Status: AC
  Administered 2022-09-22: 72 mg via INTRAVENOUS
  Filled 2022-09-22: qty 12

## 2022-09-22 MED ORDER — DIPHENHYDRAMINE HCL 50 MG/ML IJ SOLN
50.0000 mg | Freq: Once | INTRAMUSCULAR | Status: AC
  Administered 2022-09-22: 50 mg via INTRAVENOUS
  Filled 2022-09-22: qty 1

## 2022-09-22 MED ORDER — ALBUTEROL SULFATE (2.5 MG/3ML) 0.083% IN NEBU: 2.5000 mg | INHALATION_SOLUTION | Freq: Four times a day (QID) | RESPIRATORY_TRACT | 12 refills | Status: DC | PRN

## 2022-09-22 MED ORDER — SODIUM CHLORIDE 0.9 % IV SOLN
139.2000 mg | Freq: Once | INTRAVENOUS | Status: AC
  Administered 2022-09-22: 140 mg via INTRAVENOUS
  Filled 2022-09-22: qty 14

## 2022-09-22 MED ORDER — SODIUM CHLORIDE 0.9 % IV SOLN: Freq: Once | INTRAVENOUS | Status: AC

## 2022-09-22 MED ORDER — PALONOSETRON HCL INJECTION 0.25 MG/5ML
0.2500 mg | Freq: Once | INTRAVENOUS | Status: AC
  Administered 2022-09-22: 0.25 mg via INTRAVENOUS
  Filled 2022-09-22: qty 5

## 2022-09-22 NOTE — Progress Notes (Signed)
[facility] Telephone:(336) [phone]   Fax:(336) [phone]  OFFICE PROGRESS NOTE  [name], MD [address] [place] [address]  DIAGNOSIS: Stage IIIB (T3, N2, M0) non-small cell lung cancer, squamous cell carcinoma presented with obstructive left lingular mass with consolidation and collapse of the lingula as well as part of the left upper lobe and likely invasion of the mediastinum diagnosed in February 2024.   Biomarker Findings Microsatellite status - MS-Stable Tumor Mutational Burden - 5 Muts/Mb Genomic Findings For a complete list of the genes assayed, please refer to the Appendix. CCND1 amplification FGFR1 amplification MDM2 amplification FGF19 amplification FGF3 amplification FGF4 amplification TP53 Q136* 8 Disease relevant genes with no reportable alterations: ALK, BRAF, EGFR, ERBB2, KRAS, MET, RET, ROS1  PDL1: 5%   PRIOR THERAPY: None   CURRENT THERAPY: Concurrent chemoradiation with weekly carboplatin for AUC of 2 and paclitaxel 45 Mg/M2. Status post 5 weeks of treatment.   INTERVAL HISTORY: Evelyn Jordan 72 y.o. female returns to the clinic today for follow-up visit accompanied by her daughter Marcelino Duster.  The patient is feeling fine today with no concerning complaints except for some skin burns from the radiation.  She denied having any chest pain, shortness of breath and her cough has improved.  She denied having any hemoptysis.  She has no nausea, vomiting, diarrhea but has constipation and she is trying some laxative.  She denied having any fever or chills.  She is here today for evaluation before starting cycle #6 of her treatment.   MEDICAL HISTORY: Past Medical History:  Diagnosis Date   Depression    Insomnia    Osteoporosis    Pulmonary nodule    Thyroid nodule     ALLERGIES:  is allergic to compazine [prochlorperazine edisylate].  MEDICATIONS:  Current Outpatient Medications  Medication Sig Dispense Refill    albuterol (PROVENTIL) (2.5 MG/3ML) 0.083% nebulizer solution Take 3 mLs (2.5 mg total) by nebulization every 6 (six) hours as needed for wheezing or shortness of breath. 75 mL 12   albuterol (VENTOLIN HFA) 108 (90 Base) MCG/ACT inhaler Inhale 2 puffs into the lungs every 6 (six) hours as needed for wheezing or shortness of breath. 8 g 6   ALPRAZolam (XANAX) 0.5 MG tablet Take 1 tablet (0.5 mg total) by mouth at bedtime as needed for anxiety or sleep. 30 tablet 5   docusate sodium (COLACE) 100 MG capsule Take 100 mg by mouth every other day.     HYDROcodone bit-homatropine (HYCODAN) 5-1.5 MG/5ML syrup Take 5 mLs by mouth every 6 (six) hours as needed for cough. 120 mL 0   lidocaine-prilocaine (EMLA) cream Apply 1 Application topically as needed. 30 g 2   ondansetron (ZOFRAN) 8 MG tablet Take 1 tablet (8 mg total) by mouth every 8 (eight) hours as needed for nausea or vomiting. 20 tablet 0   sucralfate (CARAFATE) 1 g tablet Take 1 tablet (1 g total) by mouth 4 (four) times daily -  with meals and at bedtime. 5 min before meals for radiation induced esophagitis 360 tablet 2   Tiotropium Bromide Monohydrate (SPIRIVA RESPIMAT) 2.5 MCG/ACT AERS Inhale 2 puffs into the lungs daily. (Patient not taking: Reported on 09/15/2022) 12 each 4   No current facility-administered medications for this visit.    SURGICAL HISTORY:  Past Surgical History:  Procedure Laterality Date   ANKLE SURGERY Right    BREAST BIOPSY Right    benign 2013   BRONCHIAL BIOPSY  08/04/2022   Procedure: BRONCHIAL BIOPSIES;  Surgeon: Leslye Peer, MD;  Location: [facility] ENDOSCOPY;  Service: Cardiopulmonary;;   BRONCHIAL BRUSHINGS  08/04/2022   Procedure: BRONCHIAL BRUSHINGS;  Surgeon: Leslye Peer, MD;  Location[facility] END[facility]COPY;  Service: Cardiopulmonary;;   HEMOSTASIS CONTROL  08/04/2022   Procedure: HEMOSTASIS CONTROL;  Surgeon: Leslye Peer, MD;  Location: [facility] ENDOSCOPY;  Service: Cardiopulmonary;;   IR IMAGING GUIDED PORT INSERTION   09/04/2022   repair of a torn meniscus Left 05/2018   VIDEO BRONCHOSCOPY Left 08/04/2022   Procedure: VIDEO BRONCHOSCOPY WITH FLUORO;  Surgeon: Leslye Peer, MD;  Location: [facility] ENDOSCOPY;  Service: Cardiopulmonary;  Laterality: Left;    REVIEW OF SYSTEMS:  A comprehensive review of systems was negative except for: Gastrointestinal: positive for constipation   PHYSICAL EXAMINATION: General appearance: alert, cooperative, and no distress Head: Normocephalic, without obvious abnormality, atraumatic Neck: no adenopathy, no JVD, supple, symmetrical, trachea midline, and thyroid not enlarged, symmetric, no tenderness/mass/nodules Lymph nodes: Cervical, supraclavicular, and axillary nodes normal. Resp: clear to auscultation bilaterally Back: symmetric, no curvature. ROM normal. No CVA tenderness. Cardio: regular rate and rhythm, S1, S2 normal, no murmur, click, rub or gallop GI: soft, non-tender; bowel sounds normal; no masses,  no organomegaly Extremities: extremities normal, atraumatic, no cyanosis or edema  ECOG PERFORMANCE STATUS: 1 - Symptomatic but completely ambulatory  Blood pressure 124/83, pulse 86, temperature 98.2 F (36.8 C), temperature source Oral, resp. rate 16, weight 121 lb 3.2 oz (55 kg), SpO2 100 %.  LABORATORY DATA: Lab Results  Component Value Date   WBC 3.1 (L) 09/22/2022   HGB 10.5 (L) 09/22/2022   HCT 31.1 (L) 09/22/2022   MCV 89.4 09/22/2022   PLT 169 09/22/2022      Chemistry      Component Value Date/Time   NA 136 09/15/2022 1229   NA 141 03/07/2015 0000   K 4.2 09/15/2022 1229   CL 104 09/15/2022 1229   CO2 27 09/15/2022 1229   BUN 10 09/15/2022 1229   CREATININE 0.69 09/15/2022 1229   GLU 103 03/07/2015 0000      Component Value Date/Time   CALCIUM 8.9 09/15/2022 1229   ALKPHOS 65 09/15/2022 1229   AST 14 (L) 09/15/2022 1229   ALT 16 09/15/2022 1229   BILITOT 0.3 09/15/2022 1229       RADIOGRAPHIC STUDIES: IR IMAGING GUIDED PORT  INSERTION  Result Date: 09/04/2022 CLINICAL DATA:  Lung cancer, access for chemotherapy EXAM: RIGHT INTERNAL JUGULAR SINGLE LUMEN POWER PORT CATHETER INSERTION Date:  09/04/2022 09/04/2022 11:53 am Radiologist:  Judie Petit. Ruel Favors, MD Guidance:  Ultrasound and fluoroscopic MEDICATIONS: 1% lidocaine local with epinephrine ANESTHESIA/SEDATION: Versed 2.0 mg IV; Fentanyl 100 mcg IV; Benadryl 25 mg IV Moderate Sedation Time:  27 minute The patient was continuously monitored during the procedure by the interventional radiology nurse under my direct supervision. FLUOROSCOPY: 0 minutes, 42 seconds (1 mGy) COMPLICATIONS: None immediate. CONTRAST:  None. PROCEDURE: Informed consent was obtained from the patient following explanation of the procedure, risks, benefits and alternatives. The patient understands, agrees and consents for the procedure. All questions were addressed. A time out was performed. Maximal barrier sterile technique utilized including caps, mask, sterile gowns, sterile gloves, large sterile drape, hand hygiene, and 2% chlorhexidine scrub. Under sterile conditions and local anesthesia, right internal jugular micropuncture venous access was performed. Access was performed with ultrasound. Images were obtained for documentation of the patent right internal jugular vein. A guide wire was inserted followed by  a transitional dilator. This allowed insertion of a guide wire and catheter into the IVC. Measurements were obtained from the SVC / RA junction back to the right IJ venotomy site. In the right infraclavicular chest, a subcutaneous pocket was created over the second anterior rib. This was done under sterile conditions and local anesthesia. 1% lidocaine with epinephrine was utilized for this. A 2.5 cm incision was made in the skin. Blunt dissection was performed to create a subcutaneous pocket over the right pectoralis major muscle. The pocket was flushed with saline vigorously. There was adequate hemostasis.  The port catheter was assembled and checked for leakage. The port catheter was secured in the pocket with two retention sutures. The tubing was tunneled subcutaneously to the right venotomy site and inserted into the SVC/RA junction through a valved peel-away sheath. Position was confirmed with fluoroscopy. Images were obtained for documentation. The patient tolerated the procedure well. No immediate complications. Incisions were closed in a two layer fashion with 4 - 0 Vicryl suture. Dermabond was applied to the skin. The port catheter was accessed, blood was aspirated followed by saline and heparin flushes. Needle was removed. A dry sterile dressing was applied. IMPRESSION: Ultrasound and fluoroscopically guided right internal jugular single lumen power port catheter insertion. Tip in the SVC/RA junction. Catheter ready for use. Electronically Signed   By: Judie Petit.  Shick M.D.   On: 09/04/2022 12:22    ASSESSMENT AND PLAN: This is a very pleasant 72 years old white female with Stage IIIB (T3, N2, M0) non-small cell lung cancer, squamous cell carcinoma presented with obstructive left lingular mass with consolidation and collapse of the lingula as well as part of the left upper lobe and likely invasion of the mediastinum diagnosed in February 2024.  Molecular studies by foundation 1 showed no actionable mutations PDL1: 5% The patient is currently undergoing a course of concurrent chemoradiation with weekly carboplatin for AUC of 2 and paclitaxel 45 Mg/M2 status post 5 cycles.  She has been tolerating this treatment well with no concerning adverse effects. She has been tolerating this treatment well with no concerning adverse effect except for mild constipation. I recommended for her to proceed with cycle #6 today as planned. I will see her back for follow-up visit in 2 weeks for evaluation before the last cycle of her treatment. She was advised to call immediately if she has any other concerning symptoms in the  interval. The patient voices understanding of current disease status and treatment options and is in agreement with the current care plan.  All questions were answered. The patient knows to call the clinic with any problems, questions or concerns. We can certainly see the patient much sooner if necessary.  The total time spent in the appointment was 20 minutes.  Disclaimer: This note was dictated with voice recognition software. Similar sounding words can inadvertently be transcribed and may not be corrected upon review.

## 2022-09-22 NOTE — Telephone Encounter (Signed)
I spoke with [facility] 4/12 and the diagnosis code that we have of malignant neoplasm will not work for the albuterol solution.  Dr. Delton Coombes, is there a different diagnosis code that can be used so pt can get her neb sol?

## 2022-09-22 NOTE — Telephone Encounter (Signed)
Rx has been sent in with diagnosis of J44.9 which is for COPD.  Called pt's pharmacy and gave a verbal of the diagnosis code and they verbalized understanding. Nothing further needed.

## 2022-09-22 NOTE — Telephone Encounter (Signed)
Try using COPD instead

## 2022-09-22 NOTE — Patient Instructions (Signed)
[facility]  Discharge Instructions: Thank you for choosing [facility] to provide your oncology and hematology care.   If you have a lab appointment with the Cancer Center, please go directly to the Cancer Center and check in at the registration area.   Wear comfortable clothing and clothing appropriate for easy access to any Portacath or PICC line.   We strive to give you quality time with your provider. You may need to reschedule your appointment if you arrive late (15 or more minutes).  Arriving late affects you and other patients whose appointments are after yours.  Also, if you miss three or more appointments without notifying the office, you may be dismissed from the clinic at the provider's discretion.      For prescription refill requests, have your pharmacy contact our office and allow 72 hours for refills to be completed.    Today you received the following chemotherapy and/or immunotherapy agents paclitaxel, carboplatin      To help prevent nausea and vomiting after your treatment, we encourage you to take your nausea medication as directed.  BELOW ARE SYMPTOMS THAT SHOULD BE REPORTED IMMEDIATELY: *FEVER GREATER THAN 100.4 F (38 C) OR HIGHER *CHILLS OR SWEATING *NAUSEA AND VOMITING THAT IS NOT CONTROLLED WITH YOUR NAUSEA MEDICATION *UNUSUAL SHORTNESS OF BREATH *UNUSUAL BRUISING OR BLEEDING *URINARY PROBLEMS (pain or burning when urinating, or frequent urination) *BOWEL PROBLEMS (unusual diarrhea, constipation, pain near the anus) TENDERNESS IN MOUTH AND THROAT WITH OR WITHOUT PRESENCE OF ULCERS (sore throat, sores in mouth, or a toothache) UNUSUAL RASH, SWELLING OR PAIN  UNUSUAL VAGINAL DISCHARGE OR ITCHING   Items with * indicate a potential emergency and should be followed up as soon as possible or go to the Emergency Department if any problems should occur.  Please show the CHEMOTHERAPY ALERT CARD or IMMUNOTHERAPY ALERT  CARD at check-in to the Emergency Department and triage nurse.  Should you have questions after your visit or need to cancel or reschedule your appointment, please contact [facility]  Dept: [phone]  and follow the prompts.  Office hours are 8:00 a.m. to 4:30 p.m. Monday - Friday. Please note that voicemails left after 4:00 p.m. may not be returned until the following business day.  We are closed weekends and major holidays. You have access to a nurse at all times for urgent questions. Please call the main number to the clinic Dept: [phone] and follow the prompts.   For any non-urgent questions, you may also contact your provider using MyChart. We now offer e-Visits for anyone 18 and older to request care online for non-urgent symptoms. For details visit [email or web address].   Also download the MyChart app! Go to the app store, search "MyChart", open the app, select [facility], and log in with your MyChart username and password. 

## 2022-09-22 NOTE — Progress Notes (Signed)
Patient seen by Dr. Gypsy Balsam are within treatment parameters.  Labs reviewed: and are within treatment parameters. CMP pending.  Per physician team, patient is ready for treatment and there are NO modifications to the treatment plan.

## 2022-09-23 ENCOUNTER — Encounter: Payer: Self-pay | Admitting: Emergency Medicine

## 2022-09-23 ENCOUNTER — Other Ambulatory Visit: Payer: Self-pay

## 2022-09-23 ENCOUNTER — Ambulatory Visit
Admission: RE | Admit: 2022-09-23 | Discharge: 2022-09-23 | Disposition: A | Discharge status: Home / Self Care | Payer: Medicare Other | Source: Ambulatory Visit | Attending: Radiation Oncology | Admitting: Radiation Oncology

## 2022-09-23 DIAGNOSIS — Z5111 Encounter for antineoplastic chemotherapy: Secondary | ICD-10-CM | POA: Diagnosis not present

## 2022-09-23 DIAGNOSIS — Z510 Encounter for antineoplastic radiation therapy: Secondary | ICD-10-CM | POA: Diagnosis not present

## 2022-09-23 DIAGNOSIS — R1319 Other dysphagia: Secondary | ICD-10-CM | POA: Diagnosis not present

## 2022-09-23 DIAGNOSIS — Z87891 Personal history of nicotine dependence: Secondary | ICD-10-CM | POA: Diagnosis not present

## 2022-09-23 DIAGNOSIS — C3412 Malignant neoplasm of upper lobe, left bronchus or lung: Secondary | ICD-10-CM | POA: Diagnosis not present

## 2022-09-23 LAB — RAD ONC ARIA SESSION SUMMARY
Course Elapsed Days: 29
Plan Fractions Treated to Date: 22
Plan Prescribed Dose Per Fraction: 2 Gy
Plan Total Fractions Prescribed: 30
Plan Total Prescribed Dose: 60 Gy
Reference Point Dosage Given to Date: 44 Gy
Reference Point Session Dosage Given: 2 Gy
Session Number: 22

## 2022-09-24 ENCOUNTER — Ambulatory Visit
Admission: RE | Admit: 2022-09-24 | Discharge: 2022-09-24 | Disposition: A | Discharge status: Home / Self Care | Payer: Medicare Other | Source: Ambulatory Visit | Attending: Radiation Oncology | Admitting: Radiation Oncology

## 2022-09-24 ENCOUNTER — Encounter: Payer: Self-pay | Admitting: Emergency Medicine

## 2022-09-24 ENCOUNTER — Other Ambulatory Visit: Payer: Self-pay

## 2022-09-24 DIAGNOSIS — Z510 Encounter for antineoplastic radiation therapy: Secondary | ICD-10-CM | POA: Diagnosis not present

## 2022-09-24 DIAGNOSIS — Z5111 Encounter for antineoplastic chemotherapy: Secondary | ICD-10-CM | POA: Diagnosis not present

## 2022-09-24 DIAGNOSIS — C3412 Malignant neoplasm of upper lobe, left bronchus or lung: Secondary | ICD-10-CM | POA: Diagnosis not present

## 2022-09-24 DIAGNOSIS — R1319 Other dysphagia: Secondary | ICD-10-CM | POA: Diagnosis not present

## 2022-09-24 DIAGNOSIS — Z87891 Personal history of nicotine dependence: Secondary | ICD-10-CM | POA: Diagnosis not present

## 2022-09-24 LAB — RAD ONC ARIA SESSION SUMMARY
Course Elapsed Days: 30
Plan Fractions Treated to Date: 23
Plan Prescribed Dose Per Fraction: 2 Gy
Plan Total Fractions Prescribed: 30
Plan Total Prescribed Dose: 60 Gy
Reference Point Dosage Given to Date: 46 Gy
Reference Point Session Dosage Given: 2 Gy
Session Number: 23

## 2022-09-24 NOTE — Telephone Encounter (Signed)
Since she is improved, we can defer the PFT for now.

## 2022-09-25 ENCOUNTER — Other Ambulatory Visit: Payer: Self-pay

## 2022-09-25 ENCOUNTER — Ambulatory Visit
Admission: RE | Admit: 2022-09-25 | Discharge: 2022-09-25 | Disposition: A | Discharge status: Home / Self Care | Payer: Medicare Other | Source: Ambulatory Visit | Attending: Radiation Oncology | Admitting: Radiation Oncology

## 2022-09-25 DIAGNOSIS — C3412 Malignant neoplasm of upper lobe, left bronchus or lung: Secondary | ICD-10-CM | POA: Diagnosis not present

## 2022-09-25 DIAGNOSIS — Z510 Encounter for antineoplastic radiation therapy: Secondary | ICD-10-CM | POA: Diagnosis not present

## 2022-09-25 DIAGNOSIS — Z87891 Personal history of nicotine dependence: Secondary | ICD-10-CM | POA: Diagnosis not present

## 2022-09-25 DIAGNOSIS — Z5111 Encounter for antineoplastic chemotherapy: Secondary | ICD-10-CM | POA: Diagnosis not present

## 2022-09-25 DIAGNOSIS — R1319 Other dysphagia: Secondary | ICD-10-CM | POA: Diagnosis not present

## 2022-09-25 LAB — RAD ONC ARIA SESSION SUMMARY
Course Elapsed Days: 31
Plan Fractions Treated to Date: 24
Plan Prescribed Dose Per Fraction: 2 Gy
Plan Total Fractions Prescribed: 30
Plan Total Prescribed Dose: 60 Gy
Reference Point Dosage Given to Date: 48 Gy
Reference Point Session Dosage Given: 2 Gy
Session Number: 24

## 2022-09-26 ENCOUNTER — Other Ambulatory Visit: Payer: Self-pay

## 2022-09-26 ENCOUNTER — Ambulatory Visit
Admission: RE | Admit: 2022-09-26 | Discharge: 2022-09-26 | Disposition: A | Discharge status: Home / Self Care | Payer: Medicare Other | Source: Ambulatory Visit | Attending: Radiation Oncology | Admitting: Radiation Oncology

## 2022-09-26 DIAGNOSIS — Z5111 Encounter for antineoplastic chemotherapy: Secondary | ICD-10-CM | POA: Diagnosis not present

## 2022-09-26 DIAGNOSIS — R1319 Other dysphagia: Secondary | ICD-10-CM | POA: Diagnosis not present

## 2022-09-26 DIAGNOSIS — Z87891 Personal history of nicotine dependence: Secondary | ICD-10-CM | POA: Diagnosis not present

## 2022-09-26 DIAGNOSIS — Z510 Encounter for antineoplastic radiation therapy: Secondary | ICD-10-CM | POA: Diagnosis not present

## 2022-09-26 DIAGNOSIS — C3412 Malignant neoplasm of upper lobe, left bronchus or lung: Secondary | ICD-10-CM | POA: Diagnosis not present

## 2022-09-26 LAB — RAD ONC ARIA SESSION SUMMARY
Course Elapsed Days: 32
Plan Fractions Treated to Date: 25
Plan Prescribed Dose Per Fraction: 2 Gy
Plan Total Fractions Prescribed: 30
Plan Total Prescribed Dose: 60 Gy
Reference Point Dosage Given to Date: 50 Gy
Reference Point Session Dosage Given: 2 Gy
Session Number: 25

## 2022-09-26 MED FILL — Dexamethasone Sodium Phosphate Inj 100 MG/10ML: INTRAMUSCULAR | Qty: 1 | Status: AC

## 2022-09-29 ENCOUNTER — Other Ambulatory Visit: Payer: Self-pay

## 2022-09-29 ENCOUNTER — Inpatient Hospital Stay: Payer: Medicare Other

## 2022-09-29 ENCOUNTER — Ambulatory Visit
Admission: RE | Admit: 2022-09-29 | Discharge: 2022-09-29 | Disposition: A | Discharge status: Home / Self Care | Payer: Medicare Other | Source: Ambulatory Visit | Attending: Radiation Oncology | Admitting: Radiation Oncology

## 2022-09-29 ENCOUNTER — Other Ambulatory Visit: Payer: Medicare Other

## 2022-09-29 VITALS — BP 152/80 | HR 86 | Temp 98.3°F | Resp 16

## 2022-09-29 DIAGNOSIS — C3490 Malignant neoplasm of unspecified part of unspecified bronchus or lung: Secondary | ICD-10-CM

## 2022-09-29 DIAGNOSIS — Z5111 Encounter for antineoplastic chemotherapy: Secondary | ICD-10-CM | POA: Diagnosis not present

## 2022-09-29 DIAGNOSIS — Z510 Encounter for antineoplastic radiation therapy: Secondary | ICD-10-CM | POA: Diagnosis not present

## 2022-09-29 DIAGNOSIS — R1319 Other dysphagia: Secondary | ICD-10-CM | POA: Diagnosis not present

## 2022-09-29 DIAGNOSIS — Z95828 Presence of other vascular implants and grafts: Secondary | ICD-10-CM

## 2022-09-29 DIAGNOSIS — Z87891 Personal history of nicotine dependence: Secondary | ICD-10-CM | POA: Diagnosis not present

## 2022-09-29 DIAGNOSIS — C3412 Malignant neoplasm of upper lobe, left bronchus or lung: Secondary | ICD-10-CM | POA: Diagnosis not present

## 2022-09-29 LAB — RAD ONC ARIA SESSION SUMMARY
Course Elapsed Days: 35
Plan Fractions Treated to Date: 26
Plan Prescribed Dose Per Fraction: 2 Gy
Plan Total Fractions Prescribed: 30
Plan Total Prescribed Dose: 60 Gy
Reference Point Dosage Given to Date: 52 Gy
Reference Point Session Dosage Given: 2 Gy
Session Number: 26

## 2022-09-29 LAB — CBC WITH DIFFERENTIAL (CANCER CENTER ONLY)
Abs Immature Granulocytes: 0.01 10*3/uL (ref 0.00–0.07)
Basophils Absolute: 0 10*3/uL (ref 0.0–0.1)
Basophils Relative: 0 %
Eosinophils Absolute: 0.1 10*3/uL (ref 0.0–0.5)
Eosinophils Relative: 3 %
HCT: 32.7 % — ABNORMAL LOW (ref 36.0–46.0)
Hemoglobin: 10.8 g/dL — ABNORMAL LOW (ref 12.0–15.0)
Immature Granulocytes: 0 %
Lymphocytes Relative: 6 %
Lymphs Abs: 0.2 10*3/uL — ABNORMAL LOW (ref 0.7–4.0)
MCH: 29.9 pg (ref 26.0–34.0)
MCHC: 33 g/dL (ref 30.0–36.0)
MCV: 90.6 fL (ref 80.0–100.0)
Monocytes Absolute: 0.3 10*3/uL (ref 0.1–1.0)
Monocytes Relative: 10 %
Neutro Abs: 2.6 10*3/uL (ref 1.7–7.7)
Neutrophils Relative %: 81 %
Platelet Count: 158 10*3/uL (ref 150–400)
RBC: 3.61 MIL/uL — ABNORMAL LOW (ref 3.87–5.11)
RDW: 17.2 % — ABNORMAL HIGH (ref 11.5–15.5)
WBC Count: 3.2 10*3/uL — ABNORMAL LOW (ref 4.0–10.5)
nRBC: 0 % (ref 0.0–0.2)

## 2022-09-29 LAB — CMP (CANCER CENTER ONLY)
ALT: 51 U/L — ABNORMAL HIGH (ref 0–44)
AST: 30 U/L (ref 15–41)
Albumin: 3.5 g/dL (ref 3.5–5.0)
Alkaline Phosphatase: 56 U/L (ref 38–126)
Anion gap: 5 (ref 5–15)
BUN: 12 mg/dL (ref 8–23)
CO2: 26 mmol/L (ref 22–32)
Calcium: 9 mg/dL (ref 8.9–10.3)
Chloride: 107 mmol/L (ref 98–111)
Creatinine: 0.66 mg/dL (ref 0.44–1.00)
GFR, Estimated: >60 mL/min (ref >60)
Glucose, Bld: 97 mg/dL (ref 70–99)
Potassium: 4.1 mmol/L (ref 3.5–5.1)
Sodium: 138 mmol/L (ref 135–145)
Total Bilirubin: 0.3 mg/dL (ref 0.3–1.2)
Total Protein: 6.4 g/dL — ABNORMAL LOW (ref 6.5–8.1)

## 2022-09-29 MED ORDER — DIPHENHYDRAMINE HCL 50 MG/ML IJ SOLN
50.0000 mg | Freq: Once | INTRAMUSCULAR | Status: AC
  Administered 2022-09-29: 50 mg via INTRAVENOUS
  Filled 2022-09-29: qty 1

## 2022-09-29 MED ORDER — SODIUM CHLORIDE 0.9% FLUSH
10.0000 mL | INTRAVENOUS | Status: DC | PRN
  Administered 2022-09-29: 10 mL

## 2022-09-29 MED ORDER — SODIUM CHLORIDE 0.9 % IV SOLN: Freq: Once | INTRAVENOUS | Status: AC

## 2022-09-29 MED ORDER — SODIUM CHLORIDE 0.9 % IV SOLN
45.0000 mg/m2 | Freq: Once | INTRAVENOUS | Status: AC
  Administered 2022-09-29: 72 mg via INTRAVENOUS
  Filled 2022-09-29: qty 12

## 2022-09-29 MED ORDER — SODIUM CHLORIDE 0.9 % IV SOLN
10.0000 mg | Freq: Once | INTRAVENOUS | Status: AC
  Administered 2022-09-29: 10 mg via INTRAVENOUS
  Filled 2022-09-29: qty 10

## 2022-09-29 MED ORDER — PALONOSETRON HCL INJECTION 0.25 MG/5ML
0.2500 mg | Freq: Once | INTRAVENOUS | Status: AC
  Administered 2022-09-29: 0.25 mg via INTRAVENOUS
  Filled 2022-09-29: qty 5

## 2022-09-29 MED ORDER — SODIUM CHLORIDE 0.9 % IV SOLN
140.0000 mg | Freq: Once | INTRAVENOUS | Status: AC
  Administered 2022-09-29: 140 mg via INTRAVENOUS
  Filled 2022-09-29: qty 14

## 2022-09-29 MED ORDER — HEPARIN SOD (PORK) LOCK FLUSH 100 UNIT/ML IV SOLN
500.0000 [IU] | Freq: Once | INTRAVENOUS | Status: AC | PRN
  Administered 2022-09-29: 500 [IU]

## 2022-09-29 MED ORDER — FAMOTIDINE IN NACL 20-0.9 MG/50ML-% IV SOLN
20.0000 mg | Freq: Once | INTRAVENOUS | Status: AC
  Administered 2022-09-29: 20 mg via INTRAVENOUS
  Filled 2022-09-29: qty 50

## 2022-09-29 MED ORDER — SODIUM CHLORIDE 0.9% FLUSH
10.0000 mL | Freq: Once | INTRAVENOUS | Status: AC
  Administered 2022-09-29: 10 mL

## 2022-09-29 NOTE — Patient Instructions (Signed)
[facility]  Discharge Instructions: Thank you for choosing [facility] to provide your oncology and hematology care.   If you have a lab appointment with the Cancer Center, please go directly to the Cancer Center and check in at the registration area.   Wear comfortable clothing and clothing appropriate for easy access to any Portacath or PICC line.   We strive to give you quality time with your provider. You may need to reschedule your appointment if you arrive late (15 or more minutes).  Arriving late affects you and other patients whose appointments are after yours.  Also, if you miss three or more appointments without notifying the office, you may be dismissed from the clinic at the provider's discretion.      For prescription refill requests, have your pharmacy contact our office and allow 72 hours for refills to be completed.    Today you received the following chemotherapy and/or immunotherapy agents paclitaxel, carboplatin      To help prevent nausea and vomiting after your treatment, we encourage you to take your nausea medication as directed.  BELOW ARE SYMPTOMS THAT SHOULD BE REPORTED IMMEDIATELY: *FEVER GREATER THAN 100.4 F (38 C) OR HIGHER *CHILLS OR SWEATING *NAUSEA AND VOMITING THAT IS NOT CONTROLLED WITH YOUR NAUSEA MEDICATION *UNUSUAL SHORTNESS OF BREATH *UNUSUAL BRUISING OR BLEEDING *URINARY PROBLEMS (pain or burning when urinating, or frequent urination) *BOWEL PROBLEMS (unusual diarrhea, constipation, pain near the anus) TENDERNESS IN MOUTH AND THROAT WITH OR WITHOUT PRESENCE OF ULCERS (sore throat, sores in mouth, or a toothache) UNUSUAL RASH, SWELLING OR PAIN  UNUSUAL VAGINAL DISCHARGE OR ITCHING   Items with * indicate a potential emergency and should be followed up as soon as possible or go to the Emergency Department if any problems should occur.  Please show the CHEMOTHERAPY ALERT CARD or IMMUNOTHERAPY ALERT  CARD at check-in to the Emergency Department and triage nurse.  Should you have questions after your visit or need to cancel or reschedule your appointment, please contact [facility]  Dept: [phone]  and follow the prompts.  Office hours are 8:00 a.m. to 4:30 p.m. Monday - Friday. Please note that voicemails left after 4:00 p.m. may not be returned until the following business day.  We are closed weekends and major holidays. You have access to a nurse at all times for urgent questions. Please call the main number to the clinic Dept: [phone] and follow the prompts.   For any non-urgent questions, you may also contact your provider using MyChart. We now offer e-Visits for anyone 18 and older to request care online for non-urgent symptoms. For details visit [email or web address].   Also download the MyChart app! Go to the app store, search "MyChart", open the app, select , and log in with your MyChart username and password. 

## 2022-09-30 ENCOUNTER — Ambulatory Visit
Admission: RE | Admit: 2022-09-30 | Discharge: 2022-09-30 | Disposition: A | Discharge status: Home / Self Care | Payer: Medicare Other | Source: Ambulatory Visit | Attending: Radiation Oncology | Admitting: Radiation Oncology

## 2022-09-30 ENCOUNTER — Other Ambulatory Visit: Payer: Self-pay

## 2022-09-30 DIAGNOSIS — C3412 Malignant neoplasm of upper lobe, left bronchus or lung: Secondary | ICD-10-CM | POA: Diagnosis not present

## 2022-09-30 DIAGNOSIS — Z87891 Personal history of nicotine dependence: Secondary | ICD-10-CM | POA: Diagnosis not present

## 2022-09-30 DIAGNOSIS — R1319 Other dysphagia: Secondary | ICD-10-CM | POA: Diagnosis not present

## 2022-09-30 DIAGNOSIS — Z5111 Encounter for antineoplastic chemotherapy: Secondary | ICD-10-CM | POA: Diagnosis not present

## 2022-09-30 DIAGNOSIS — Z510 Encounter for antineoplastic radiation therapy: Secondary | ICD-10-CM | POA: Diagnosis not present

## 2022-09-30 LAB — RAD ONC ARIA SESSION SUMMARY
Course Elapsed Days: 36
Plan Fractions Treated to Date: 27
Plan Prescribed Dose Per Fraction: 2 Gy
Plan Total Fractions Prescribed: 30
Plan Total Prescribed Dose: 60 Gy
Reference Point Dosage Given to Date: 54 Gy
Reference Point Session Dosage Given: 2 Gy
Session Number: 27

## 2022-10-01 ENCOUNTER — Ambulatory Visit
Admission: RE | Admit: 2022-10-01 | Discharge: 2022-10-01 | Disposition: A | Discharge status: Home / Self Care | Payer: Medicare Other | Source: Ambulatory Visit | Attending: Radiation Oncology | Admitting: Radiation Oncology

## 2022-10-01 ENCOUNTER — Other Ambulatory Visit: Payer: Self-pay

## 2022-10-01 DIAGNOSIS — Z510 Encounter for antineoplastic radiation therapy: Secondary | ICD-10-CM | POA: Diagnosis not present

## 2022-10-01 DIAGNOSIS — R1319 Other dysphagia: Secondary | ICD-10-CM | POA: Diagnosis not present

## 2022-10-01 DIAGNOSIS — Z87891 Personal history of nicotine dependence: Secondary | ICD-10-CM | POA: Diagnosis not present

## 2022-10-01 DIAGNOSIS — Z5111 Encounter for antineoplastic chemotherapy: Secondary | ICD-10-CM | POA: Diagnosis not present

## 2022-10-01 DIAGNOSIS — C3412 Malignant neoplasm of upper lobe, left bronchus or lung: Secondary | ICD-10-CM | POA: Diagnosis not present

## 2022-10-01 LAB — RAD ONC ARIA SESSION SUMMARY
Course Elapsed Days: 37
Plan Fractions Treated to Date: 28
Plan Prescribed Dose Per Fraction: 2 Gy
Plan Total Fractions Prescribed: 30
Plan Total Prescribed Dose: 60 Gy
Reference Point Dosage Given to Date: 56 Gy
Reference Point Session Dosage Given: 2 Gy
Session Number: 28

## 2022-10-02 ENCOUNTER — Ambulatory Visit
Admission: RE | Admit: 2022-10-02 | Discharge: 2022-10-02 | Disposition: A | Discharge status: Home / Self Care | Payer: Medicare Other | Source: Ambulatory Visit | Attending: Radiation Oncology | Admitting: Radiation Oncology

## 2022-10-02 ENCOUNTER — Ambulatory Visit: Payer: Medicare Other

## 2022-10-02 ENCOUNTER — Other Ambulatory Visit: Payer: Self-pay

## 2022-10-02 DIAGNOSIS — Z5111 Encounter for antineoplastic chemotherapy: Secondary | ICD-10-CM | POA: Diagnosis not present

## 2022-10-02 DIAGNOSIS — R1319 Other dysphagia: Secondary | ICD-10-CM | POA: Diagnosis not present

## 2022-10-02 DIAGNOSIS — C3412 Malignant neoplasm of upper lobe, left bronchus or lung: Secondary | ICD-10-CM | POA: Diagnosis not present

## 2022-10-02 DIAGNOSIS — Z510 Encounter for antineoplastic radiation therapy: Secondary | ICD-10-CM | POA: Diagnosis not present

## 2022-10-02 DIAGNOSIS — Z87891 Personal history of nicotine dependence: Secondary | ICD-10-CM | POA: Diagnosis not present

## 2022-10-02 LAB — RAD ONC ARIA SESSION SUMMARY
Course Elapsed Days: 38
Plan Fractions Treated to Date: 29
Plan Prescribed Dose Per Fraction: 2 Gy
Plan Total Fractions Prescribed: 30
Plan Total Prescribed Dose: 60 Gy
Reference Point Dosage Given to Date: 58 Gy
Reference Point Session Dosage Given: 2 Gy
Session Number: 29

## 2022-10-03 ENCOUNTER — Ambulatory Visit: Payer: Medicare Other

## 2022-10-03 ENCOUNTER — Other Ambulatory Visit: Payer: Self-pay

## 2022-10-03 ENCOUNTER — Ambulatory Visit
Admission: RE | Admit: 2022-10-03 | Discharge: 2022-10-03 | Disposition: A | Discharge status: Home / Self Care | Payer: Medicare Other | Source: Ambulatory Visit | Attending: Radiation Oncology | Admitting: Radiation Oncology

## 2022-10-03 ENCOUNTER — Ambulatory Visit: Payer: Medicare Other | Admitting: Adult Health

## 2022-10-03 DIAGNOSIS — Z87891 Personal history of nicotine dependence: Secondary | ICD-10-CM | POA: Diagnosis not present

## 2022-10-03 DIAGNOSIS — Z510 Encounter for antineoplastic radiation therapy: Secondary | ICD-10-CM | POA: Diagnosis not present

## 2022-10-03 DIAGNOSIS — R1319 Other dysphagia: Secondary | ICD-10-CM | POA: Diagnosis not present

## 2022-10-03 DIAGNOSIS — Z5111 Encounter for antineoplastic chemotherapy: Secondary | ICD-10-CM | POA: Diagnosis not present

## 2022-10-03 DIAGNOSIS — C3412 Malignant neoplasm of upper lobe, left bronchus or lung: Secondary | ICD-10-CM | POA: Diagnosis not present

## 2022-10-03 LAB — RAD ONC ARIA SESSION SUMMARY
Course Elapsed Days: 39
Plan Fractions Treated to Date: 30
Plan Prescribed Dose Per Fraction: 2 Gy
Plan Total Fractions Prescribed: 30
Plan Total Prescribed Dose: 60 Gy
Reference Point Dosage Given to Date: 60 Gy
Reference Point Session Dosage Given: 2 Gy
Session Number: 30

## 2022-10-06 ENCOUNTER — Inpatient Hospital Stay (HOSPITAL_BASED_OUTPATIENT_CLINIC_OR_DEPARTMENT_OTHER): Payer: Medicare Other | Admitting: Internal Medicine

## 2022-10-06 ENCOUNTER — Other Ambulatory Visit: Payer: Self-pay

## 2022-10-06 ENCOUNTER — Ambulatory Visit: Payer: Medicare Other

## 2022-10-06 ENCOUNTER — Inpatient Hospital Stay: Payer: Medicare Other

## 2022-10-06 ENCOUNTER — Encounter: Payer: Self-pay | Admitting: Internal Medicine

## 2022-10-06 ENCOUNTER — Ambulatory Visit
Admission: RE | Admit: 2022-10-06 | Discharge: 2022-10-06 | Disposition: A | Discharge status: Home / Self Care | Payer: Medicare Other | Source: Ambulatory Visit | Attending: Radiation Oncology | Admitting: Radiation Oncology

## 2022-10-06 ENCOUNTER — Encounter: Payer: Self-pay | Admitting: Medical Oncology

## 2022-10-06 VITALS — BP 136/78 | HR 85 | Resp 16

## 2022-10-06 DIAGNOSIS — C3490 Malignant neoplasm of unspecified part of unspecified bronchus or lung: Secondary | ICD-10-CM | POA: Diagnosis not present

## 2022-10-06 DIAGNOSIS — Z95828 Presence of other vascular implants and grafts: Secondary | ICD-10-CM

## 2022-10-06 DIAGNOSIS — R1319 Other dysphagia: Secondary | ICD-10-CM | POA: Diagnosis not present

## 2022-10-06 DIAGNOSIS — C3412 Malignant neoplasm of upper lobe, left bronchus or lung: Secondary | ICD-10-CM | POA: Diagnosis not present

## 2022-10-06 DIAGNOSIS — Z5111 Encounter for antineoplastic chemotherapy: Secondary | ICD-10-CM | POA: Diagnosis not present

## 2022-10-06 DIAGNOSIS — Z87891 Personal history of nicotine dependence: Secondary | ICD-10-CM | POA: Diagnosis not present

## 2022-10-06 DIAGNOSIS — Z510 Encounter for antineoplastic radiation therapy: Secondary | ICD-10-CM | POA: Diagnosis not present

## 2022-10-06 LAB — CBC WITH DIFFERENTIAL (CANCER CENTER ONLY)
Abs Immature Granulocytes: 0.01 10*3/uL (ref 0.00–0.07)
Basophils Absolute: 0 10*3/uL (ref 0.0–0.1)
Basophils Relative: 1 %
Eosinophils Absolute: 0.1 10*3/uL (ref 0.0–0.5)
Eosinophils Relative: 3 %
HCT: 31.8 % — ABNORMAL LOW (ref 36.0–46.0)
Hemoglobin: 10.6 g/dL — ABNORMAL LOW (ref 12.0–15.0)
Immature Granulocytes: 1 %
Lymphocytes Relative: 8 %
Lymphs Abs: 0.2 10*3/uL — ABNORMAL LOW (ref 0.7–4.0)
MCH: 30.4 pg (ref 26.0–34.0)
MCHC: 33.3 g/dL (ref 30.0–36.0)
MCV: 91.1 fL (ref 80.0–100.0)
Monocytes Absolute: 0.2 10*3/uL (ref 0.1–1.0)
Monocytes Relative: 11 %
Neutro Abs: 1.4 10*3/uL — ABNORMAL LOW (ref 1.7–7.7)
Neutrophils Relative %: 76 %
Platelet Count: 145 10*3/uL — ABNORMAL LOW (ref 150–400)
RBC: 3.49 MIL/uL — ABNORMAL LOW (ref 3.87–5.11)
RDW: 17.9 % — ABNORMAL HIGH (ref 11.5–15.5)
WBC Count: 1.9 10*3/uL — ABNORMAL LOW (ref 4.0–10.5)
nRBC: 0 % (ref 0.0–0.2)

## 2022-10-06 LAB — RAD ONC ARIA SESSION SUMMARY
Course Elapsed Days: 42
Plan Fractions Treated to Date: 1
Plan Prescribed Dose Per Fraction: 2 Gy
Plan Total Fractions Prescribed: 3
Plan Total Prescribed Dose: 6 Gy
Reference Point Dosage Given to Date: 2 Gy
Reference Point Session Dosage Given: 2 Gy
Session Number: 31

## 2022-10-06 LAB — CMP (CANCER CENTER ONLY)
ALT: 32 U/L (ref 0–44)
AST: 22 U/L (ref 15–41)
Albumin: 3.5 g/dL (ref 3.5–5.0)
Alkaline Phosphatase: 53 U/L (ref 38–126)
Anion gap: 7 (ref 5–15)
BUN: 19 mg/dL (ref 8–23)
CO2: 27 mmol/L (ref 22–32)
Calcium: 9.5 mg/dL (ref 8.9–10.3)
Chloride: 107 mmol/L (ref 98–111)
Creatinine: 0.8 mg/dL (ref 0.44–1.00)
GFR, Estimated: >60 mL/min (ref >60)
Glucose, Bld: 113 mg/dL — ABNORMAL HIGH (ref 70–99)
Potassium: 4.1 mmol/L (ref 3.5–5.1)
Sodium: 141 mmol/L (ref 135–145)
Total Bilirubin: 0.4 mg/dL (ref 0.3–1.2)
Total Protein: 6 g/dL — ABNORMAL LOW (ref 6.5–8.1)

## 2022-10-06 MED ORDER — SODIUM CHLORIDE 0.9% FLUSH
10.0000 mL | Freq: Once | INTRAVENOUS | Status: AC
  Administered 2022-10-06: 10 mL

## 2022-10-06 MED ORDER — SODIUM CHLORIDE 0.9 % IV SOLN: Freq: Once | INTRAVENOUS | Status: AC

## 2022-10-06 MED ORDER — DIPHENHYDRAMINE HCL 50 MG/ML IJ SOLN
50.0000 mg | Freq: Once | INTRAMUSCULAR | Status: AC
  Administered 2022-10-06: 50 mg via INTRAVENOUS
  Filled 2022-10-06: qty 1

## 2022-10-06 MED ORDER — SODIUM CHLORIDE 0.9 % IV SOLN
45.0000 mg/m2 | Freq: Once | INTRAVENOUS | Status: AC
  Administered 2022-10-06: 72 mg via INTRAVENOUS
  Filled 2022-10-06: qty 12

## 2022-10-06 MED ORDER — SODIUM CHLORIDE 0.9 % IV SOLN
10.0000 mg | Freq: Once | INTRAVENOUS | Status: AC
  Administered 2022-10-06: 10 mg via INTRAVENOUS
  Filled 2022-10-06: qty 10

## 2022-10-06 MED ORDER — FAMOTIDINE IN NACL 20-0.9 MG/50ML-% IV SOLN
20.0000 mg | Freq: Once | INTRAVENOUS | Status: AC
  Administered 2022-10-06: 20 mg via INTRAVENOUS
  Filled 2022-10-06: qty 50

## 2022-10-06 MED ORDER — SODIUM CHLORIDE 0.9 % IV SOLN
139.2000 mg | Freq: Once | INTRAVENOUS | Status: AC
  Administered 2022-10-06: 140 mg via INTRAVENOUS
  Filled 2022-10-06: qty 14

## 2022-10-06 MED ORDER — HEPARIN SOD (PORK) LOCK FLUSH 100 UNIT/ML IV SOLN
500.0000 [IU] | Freq: Once | INTRAVENOUS | Status: AC | PRN
  Administered 2022-10-06: 500 [IU]

## 2022-10-06 MED ORDER — PALONOSETRON HCL INJECTION 0.25 MG/5ML
0.2500 mg | Freq: Once | INTRAVENOUS | Status: AC
  Administered 2022-10-06: 0.25 mg via INTRAVENOUS
  Filled 2022-10-06: qty 5

## 2022-10-06 MED ORDER — SODIUM CHLORIDE 0.9% FLUSH
10.0000 mL | INTRAVENOUS | Status: DC | PRN
  Administered 2022-10-06: 10 mL

## 2022-10-06 NOTE — Progress Notes (Signed)
[facility] Telephone:([phone]   Fax:([phone]) [phone]  OFFICE PROGRESS NOTE  [name], MD [address] [place] [address]  DIAGNOSIS: Stage IIIB (T3, N2, M0) non-small cell lung cancer, squamous cell carcinoma presented with obstructive left lingular mass with consolidation and collapse of the lingula as well as part of the left upper lobe and likely invasion of the mediastinum diagnosed in February 2024.   Biomarker Findings Microsatellite status - MS-Stable Tumor Mutational Burden - 5 Muts/Mb Genomic Findings For a complete list of the genes assayed, please refer to the Appendix. CCND1 amplification FGFR1 amplification MDM2 amplification FGF19 amplification FGF3 amplification FGF4 amplification TP53 Q136* 8 Disease relevant genes with no reportable alterations: ALK, BRAF, EGFR, ERBB2, KRAS, MET, RET, ROS1  PDL1: 5%   PRIOR THERAPY: None   CURRENT THERAPY: Concurrent chemoradiation with weekly carboplatin for AUC of 2 and paclitaxel 45 Mg/M2. Status post 7 weeks of treatment.   INTERVAL HISTORY: Evelyn Jordan 72 y.o. female returns to the clinic today for follow-up visit accompanied by her daughter Evelyn Jordan.  The patient is feeling fine today with no concerning complaints except for radiation-induced odynophagia and sore throat.  She denied having any chest pain, shortness of breath, cough or hemoptysis.  She has no nausea, vomiting, diarrhea or constipation.  She has no headache or visual changes.  She denied having any recent weight loss or night sweats.  She is here today for evaluation before the last dose of her treatment.  MEDICAL HISTORY: Past Medical History:  Diagnosis Date   Depression    Insomnia    Osteoporosis    Pulmonary nodule    Thyroid nodule     ALLERGIES:  is allergic to compazine [prochlorperazine edisylate].  MEDICATIONS:  Current Outpatient Medications  Medication Sig Dispense Refill   albuterol  (PROVENTIL) (2.5 MG/3ML) 0.083% nebulizer solution Take 3 mLs (2.5 mg total) by nebulization every 6 (six) hours as needed for wheezing or shortness of breath. 75 mL 12   albuterol (VENTOLIN HFA) 108 (90 Base) MCG/ACT inhaler Inhale 2 puffs into the lungs every 6 (six) hours as needed for wheezing or shortness of breath. 8 g 6   ALPRAZolam (XANAX) 0.5 MG tablet Take 1 tablet (0.5 mg total) by mouth at bedtime as needed for anxiety or sleep. 30 tablet 5   docusate sodium (COLACE) 100 MG capsule Take 100 mg by mouth every other day.     HYDROcodone bit-homatropine (HYCODAN) 5-1.5 MG/5ML syrup Take 5 mLs by mouth every 6 (six) hours as needed for cough. 120 mL 0   lidocaine-prilocaine (EMLA) cream Apply 1 Application topically as needed. 30 g 2   ondansetron (ZOFRAN) 8 MG tablet Take 1 tablet (8 mg total) by mouth every 8 (eight) hours as needed for nausea or vomiting. 20 tablet 0   sucralfate (CARAFATE) 1 g tablet Take 1 tablet (1 g total) by mouth 4 (four) times daily -  with meals and at bedtime. 5 min before meals for radiation induced esophagitis 360 tablet 2   Tiotropium Bromide Monohydrate (SPIRIVA RESPIMAT) 2.5 MCG/ACT AERS Inhale 2 puffs into the lungs daily. (Patient not taking: Reported on 09/15/2022) 12 each 4   No current facility-administered medications for this visit.    SURGICAL HISTORY:  Past Surgical History:  Procedure Laterality Date   ANKLE SURGERY Right    BREAST BIOPSY Right    benign 2013   BRONCHIAL BIOPSY  08/04/2022   Procedure: BRONCHIAL  BIOPSIES;  Surgeon: Leslye Peer, MD;  Location: [facility] ENDOSCOPY;  Service: Cardiopulmonary;;   BRONCHIAL BRUSHINGS  08/04/2022   Procedure: BRONCHIAL BRUSHINGS;  Surgeon: Leslye Peer, MD;  Location: [facility] ENDOSCOPY;  Service: Cardiopulmonary;;   HEMOSTASIS CONTROL  08/04/2022   Procedure: HEMOSTASIS CONTROL;  Surgeon: Leslye Peer, MD;  Location: [facility] ENDOSCOPY;  Service: Cardiopulmonary;;   IR IMAGING GUIDED PORT INSERTION  09/04/2022    repair of a torn meniscus Left 05/2018   VIDEO BRONCHOSCOPY Left 08/04/2022   Procedure: VIDEO BRONCHOSCOPY WITH FLUORO;  Surgeon: Leslye Peer, MD;  Location: [facility] ENDOSCOPY;  Service: Cardiopulmonary;  Laterality: Left;    REVIEW OF SYSTEMS:  A comprehensive review of systems was negative except for: Constitutional: positive for fatigue Gastrointestinal: positive for odynophagia   PHYSICAL EXAMINATION: General appearance: alert, cooperative, and no distress Head: Normocephalic, without obvious abnormality, atraumatic Neck: no adenopathy, no JVD, supple, symmetrical, trachea midline, and thyroid not enlarged, symmetric, no tenderness/mass/nodules Lymph nodes: Cervical, supraclavicular, and axillary nodes normal. Resp: clear to auscultation bilaterally Back: symmetric, no curvature. ROM normal. No CVA tenderness. Cardio: regular rate and rhythm, S1, S2 normal, no murmur, click, rub or gallop GI: soft, non-tender; bowel sounds normal; no masses,  no organomegaly Extremities: extremities normal, atraumatic, no cyanosis or edema  ECOG PERFORMANCE STATUS: 1 - Symptomatic but completely ambulatory  Blood pressure 135/84, pulse (!) 102, temperature (!) 97 F (36.1 C), temperature source Temporal, resp. rate 16, weight 120 lb 3.2 oz (54.5 kg), SpO2 99 %.  LABORATORY DATA: Lab Results  Component Value Date   WBC 3.2 (L) 09/29/2022   HGB 10.8 (L) 09/29/2022   HCT 32.7 (L) 09/29/2022   MCV 90.6 09/29/2022   PLT 158 09/29/2022      Chemistry      Component Value Date/Time   NA 138 09/29/2022 1228   NA 141 03/07/2015 0000   K 4.1 09/29/2022 1228   CL 107 09/29/2022 1228   CO2 26 09/29/2022 1228   BUN 12 09/29/2022 1228   CREATININE 0.66 09/29/2022 1228   GLU 103 03/07/2015 0000      Component Value Date/Time   CALCIUM 9.0 09/29/2022 1228   ALKPHOS 56 09/29/2022 1228   AST 30 09/29/2022 1228   ALT 51 (H) 09/29/2022 1228   BILITOT 0.3 09/29/2022 1228       RADIOGRAPHIC  STUDIES: No results found.  ASSESSMENT AND PLAN: This is a very pleasant 72 years old white female with Stage IIIB (T3, N2, M0) non-small cell lung cancer, squamous cell carcinoma presented with obstructive left lingular mass with consolidation and collapse of the lingula as well as part of the left upper lobe and likely invasion of the mediastinum diagnosed in February 2024.  Molecular studies by foundation 1 showed no actionable mutations PDL1: 5% The patient is currently undergoing a course of concurrent chemoradiation with weekly carboplatin for AUC of 2 and paclitaxel 45 Mg/M2 status post 7  cycles.   The patient has been tolerating her treatment fairly well except for the radiation-induced odynophagia and fatigue. I recommended for her to proceed with the last cycle of her treatment today as planned. I will see her back for follow-up visit in 1 months for evaluation with repeat CT scan of the chest for restaging of her disease. The patient was advised to call immediately if she has any other concerning symptoms in the interval. The patient voices understanding of current disease status and treatment options and is in agreement with  the current care plan.  All questions were answered. The patient knows to call the clinic with any problems, questions or concerns. We can certainly see the patient much sooner if necessary.  The total time spent in the appointment was 20 minutes.  Disclaimer: This note was dictated with voice recognition software. Similar sounding words can inadvertently be transcribed and may not be corrected upon review.

## 2022-10-06 NOTE — Progress Notes (Signed)
Patient seen by Dr. Gypsy Balsam are not all within treatment parameters.   Per Dr. Arbutus Ped ,it is ok to treat pt today with Carboplatin and taxol and heart rate of 102.  Labs reviewed: and are not within treatment parameters. PEr Dr. Arbutus Ped ,it is oka to treat pt today with Carboplatin and taxol and ANC= 1.4.  Per physician team, patient is ready for treatment and there are NO modifications to the treatment plan.  This is pts last treatment.

## 2022-10-06 NOTE — Patient Instructions (Signed)
[facility]  Discharge Instructions: Thank you for choosing [facility] to provide your oncology and hematology care.   If you have a lab appointment with the Cancer Center, please go directly to the Cancer Center and check in at the registration area.   Wear comfortable clothing and clothing appropriate for easy access to any Portacath or PICC line.   We strive to give you quality time with your provider. You may need to reschedule your appointment if you arrive late (15 or more minutes).  Arriving late affects you and other patients whose appointments are after yours.  Also, if you miss three or more appointments without notifying the office, you may be dismissed from the clinic at the provider's discretion.      For prescription refill requests, have your pharmacy contact our office and allow 72 hours for refills to be completed.    Today you received the following chemotherapy and/or immunotherapy agents Taxol and Carboplatin.      To help prevent nausea and vomiting after your treatment, we encourage you to take your nausea medication as directed.  BELOW ARE SYMPTOMS THAT SHOULD BE REPORTED IMMEDIATELY: *FEVER GREATER THAN 100.4 F (38 C) OR HIGHER *CHILLS OR SWEATING *NAUSEA AND VOMITING THAT IS NOT CONTROLLED WITH YOUR NAUSEA MEDICATION *UNUSUAL SHORTNESS OF BREATH *UNUSUAL BRUISING OR BLEEDING *URINARY PROBLEMS (pain or burning when urinating, or frequent urination) *BOWEL PROBLEMS (unusual diarrhea, constipation, pain near the anus) TENDERNESS IN MOUTH AND THROAT WITH OR WITHOUT PRESENCE OF ULCERS (sore throat, sores in mouth, or a toothache) UNUSUAL RASH, SWELLING OR PAIN  UNUSUAL VAGINAL DISCHARGE OR ITCHING   Items with * indicate a potential emergency and should be followed up as soon as possible or go to the Emergency Department if any problems should occur.  Please show the CHEMOTHERAPY ALERT CARD or IMMUNOTHERAPY ALERT  CARD at check-in to the Emergency Department and triage nurse.  Should you have questions after your visit or need to cancel or reschedule your appointment, please contact [facility]  Dept: [phone]  and follow the prompts.  Office hours are 8:00 a.m. to 4:30 p.m. Monday - Friday. Please note that voicemails left after 4:00 p.m. may not be returned until the following business day.  We are closed weekends and major holidays. You have access to a nurse at all times for urgent questions. Please call the main number to the clinic Dept: [phone] and follow the prompts.   For any non-urgent questions, you may also contact your provider using MyChart. We now offer e-Visits for anyone 18 and older to request care online for non-urgent symptoms. For details visit [email or web address].   Also download the MyChart app! Go to the app store, search "MyChart", open the app, select [facility], and log in with your MyChart username and password.  

## 2022-10-06 NOTE — Progress Notes (Signed)
NN met with pt today at her follow up with Dr.Mohamed. Today was C7 of her Carbo/Taxol infusion therapy with concurrent radiation.  Pt is to have Chest CT for restaging in 1 month and a follow up appt with Dr.Mohamed.  Pt has a CT Chest booked for 5/28 @ 11am at [facility] and a follow up appt with Dr.Mohamed on 5/30.  Pt notified via telephone of her CT appt. Pt states she will look for the appt in her MyChart account, as well. NN encouraged pt to call with any questions or concerns.

## 2022-10-07 ENCOUNTER — Other Ambulatory Visit: Payer: Self-pay

## 2022-10-07 ENCOUNTER — Ambulatory Visit
Admission: RE | Admit: 2022-10-07 | Discharge: 2022-10-07 | Disposition: A | Discharge status: Home / Self Care | Payer: Medicare Other | Source: Ambulatory Visit | Attending: Radiation Oncology | Admitting: Radiation Oncology

## 2022-10-07 ENCOUNTER — Other Ambulatory Visit: Payer: Self-pay | Admitting: Radiation Oncology

## 2022-10-07 ENCOUNTER — Telehealth: Payer: Self-pay | Admitting: Internal Medicine

## 2022-10-07 ENCOUNTER — Other Ambulatory Visit (HOSPITAL_COMMUNITY): Payer: Self-pay

## 2022-10-07 ENCOUNTER — Ambulatory Visit: Payer: Medicare Other

## 2022-10-07 DIAGNOSIS — R1319 Other dysphagia: Secondary | ICD-10-CM | POA: Diagnosis not present

## 2022-10-07 DIAGNOSIS — Z5111 Encounter for antineoplastic chemotherapy: Secondary | ICD-10-CM | POA: Diagnosis not present

## 2022-10-07 DIAGNOSIS — C3412 Malignant neoplasm of upper lobe, left bronchus or lung: Secondary | ICD-10-CM | POA: Diagnosis not present

## 2022-10-07 LAB — RAD ONC ARIA SESSION SUMMARY
Course Elapsed Days: 43
Plan Fractions Treated to Date: 2
Plan Prescribed Dose Per Fraction: 2 Gy
Plan Total Fractions Prescribed: 3
Plan Total Prescribed Dose: 6 Gy
Reference Point Dosage Given to Date: 4 Gy
Reference Point Session Dosage Given: 2 Gy
Session Number: 32

## 2022-10-07 MED ORDER — LIDOCAINE VISCOUS HCL 2 % MT SOLN: 15.0000 mL | Freq: Three times a day (TID) | OROMUCOSAL | 1 refills | Status: DC

## 2022-10-07 NOTE — Telephone Encounter (Signed)
Called patient regarding upcoming May appointments, patient is notified.  ?

## 2022-10-08 ENCOUNTER — Other Ambulatory Visit: Payer: Self-pay

## 2022-10-08 ENCOUNTER — Ambulatory Visit: Payer: Medicare Other

## 2022-10-08 ENCOUNTER — Ambulatory Visit
Admission: RE | Admit: 2022-10-08 | Discharge: 2022-10-08 | Disposition: A | Discharge status: Home / Self Care | Payer: Medicare Other | Source: Ambulatory Visit | Attending: Radiation Oncology | Admitting: Radiation Oncology

## 2022-10-08 DIAGNOSIS — C3412 Malignant neoplasm of upper lobe, left bronchus or lung: Secondary | ICD-10-CM | POA: Diagnosis not present

## 2022-10-08 DIAGNOSIS — Z510 Encounter for antineoplastic radiation therapy: Secondary | ICD-10-CM | POA: Diagnosis not present

## 2022-10-08 DIAGNOSIS — Z5111 Encounter for antineoplastic chemotherapy: Secondary | ICD-10-CM | POA: Insufficient documentation

## 2022-10-08 DIAGNOSIS — Z87891 Personal history of nicotine dependence: Secondary | ICD-10-CM | POA: Diagnosis not present

## 2022-10-08 LAB — RAD ONC ARIA SESSION SUMMARY
Course Elapsed Days: 44
Plan Fractions Treated to Date: 3
Plan Prescribed Dose Per Fraction: 2 Gy
Plan Total Fractions Prescribed: 3
Plan Total Prescribed Dose: 6 Gy
Reference Point Dosage Given to Date: 6 Gy
Reference Point Session Dosage Given: 2 Gy
Session Number: 33

## 2022-10-09 ENCOUNTER — Ambulatory Visit: Payer: Medicare Other

## 2022-10-09 ENCOUNTER — Ambulatory Visit: Payer: Medicare Other | Admitting: Nurse Practitioner

## 2022-10-09 ENCOUNTER — Telehealth: Payer: Self-pay | Admitting: Family Medicine

## 2022-10-09 NOTE — Telephone Encounter (Signed)
Contacted Evelyn Jordan to schedule their annual wellness visit. Appointment made for 10/21/2022.  Evelyn Jordan [facility] AWV TEAM Direct Dial [phone]

## 2022-10-11 ENCOUNTER — Encounter: Payer: Self-pay | Admitting: Internal Medicine

## 2022-10-13 NOTE — Radiation Completion Notes (Addendum)
  Radiation Oncology         [phone] ________________________________  Name: Evelyn Jordan MRN: [id number]  Date of Service: 10/08/2022  DOB: 11-30-1950  End of Treatment Note   Diagnosis: Stage IIIB, cT3N2M0, NSCLC, Squamous Cell Carcinoma of the left Lingula   Intent: Curative     ==========DELIVERED PLANS==========  First Treatment Date: 2022-08-25 - Last Treatment Date: 2022-10-08   Plan Name: Lung_L Site: Lung, Left Technique: 3D Mode: Photon Dose Per Fraction: 2 Gy Prescribed Dose (Delivered / Prescribed): 60 Gy / 60 Gy Prescribed Fxs (Delivered / Prescribed): 30 / 30   Plan Name: Lung_L_Bst Site: Lung, Left Technique: 3D Mode: Photon Dose Per Fraction: 2 Gy Prescribed Dose (Delivered / Prescribed): 6 Gy / 6 Gy Prescribed Fxs (Delivered / Prescribed): 3 / 3     ==========ON TREATMENT VISIT DATES========== 2022-08-28, 2022-09-05, 2022-09-12, 2022-09-19, 2022-09-26, 2022-10-03, 2022-10-07    See weekly On Treatment Notes is Epic for details. The patient tolerated radiation. She developed fatigue and anticipated esophagitis from therapy.  The patient will receive a call in about one month from the radiation oncology department. She will continue follow up with Dr. Arbutus Ped as well.      Osker Mason, PAC

## 2022-10-15 ENCOUNTER — Telehealth: Payer: Self-pay | Admitting: Medical Oncology

## 2022-10-15 NOTE — Telephone Encounter (Signed)
-----   Message from Si Gaul, MD sent at 10/15/2022  8:16 AM EDT ----- Regarding: RE: vision Yes. It is concerning if persistent. She may need to see her Eye doctor or we order MRI brain if no improvement  ----- Message ----- From: Charma Igo, RN Sent: 10/14/2022   5:43 PM EDT To: Si Gaul, MD Subject: vision                                         Evelyn Jordan Onc Nurse Cc Phone Number: [phone]  I had temporary episode of double vision lasting several minutes. Double vision  improved when closing one eye. Should I be concerned?

## 2022-10-15 NOTE — Telephone Encounter (Signed)
Pt notified . She said she only had one episode of the double vision and she will contact us if it happens again.

## 2022-10-21 ENCOUNTER — Ambulatory Visit (INDEPENDENT_AMBULATORY_CARE_PROVIDER_SITE_OTHER): Payer: Medicare Other

## 2022-10-21 VITALS — Wt 120.0 lb

## 2022-10-21 DIAGNOSIS — Z0000 Encounter for general adult medical examination without abnormal findings: Secondary | ICD-10-CM | POA: Diagnosis not present

## 2022-10-21 NOTE — Patient Instructions (Signed)
Evelyn Jordan , Thank you for taking time to come for your Medicare Wellness Visit. I appreciate your ongoing commitment to your health goals. Please review the following plan we discussed and let me know if I can assist you in the future.   These are the goals we discussed:  Goals      Maintain current health      Patient Stated     Stay healthy     Patient Stated     None at this time      Patient Stated     Stay healthy with         This is a list of the screening recommended for you and due dates:  Health Maintenance  Topic Date Due   COVID-19 Vaccine (5 - 2023-24 season) 05/18/2022   DEXA scan (bone density measurement)  10/31/2022   Flu Shot  01/08/2023   Mammogram  08/01/2023   Medicare Annual Wellness Visit  10/21/2023   Colon Cancer Screening  08/13/2024   DTaP/Tdap/Td vaccine (3 - Td or Tdap) 12/24/2027   Pneumonia Vaccine  Completed   Hepatitis C Screening: USPSTF Recommendation to screen - Ages 20-79 yo.  Completed   Zoster (Shingles) Vaccine  Completed   HPV Vaccine  Aged Out   Screening for Lung Cancer  Discontinued    Advanced directives: Please bring a copy of your health care power of attorney and living will to the office at your convenience.  Conditions/risks identified: stay healthy and live long  Next appointment: Follow up in one year for your annual wellness visit    Preventive Care 65 Years and Older, Female Preventive care refers to lifestyle choices and visits with your health care provider that can promote health and wellness. What does preventive care include? A yearly physical exam. This is also called an annual well check. Dental exams once or twice a year. Routine eye exams. Ask your health care provider how often you should have your eyes checked. Personal lifestyle choices, including: Daily care of your teeth and gums. Regular physical activity. Eating a healthy diet. Avoiding tobacco and drug use. Limiting alcohol use. Practicing  safe sex. Taking low-dose aspirin every day. Taking vitamin and mineral supplements as recommended by your health care provider. What happens during an annual well check? The services and screenings done by your health care provider during your annual well check will depend on your age, overall health, lifestyle risk factors, and family history of disease. Counseling  Your health care provider may ask you questions about your: Alcohol use. Tobacco use. Drug use. Emotional well-being. Home and relationship well-being. Sexual activity. Eating habits. History of falls. Memory and ability to understand (cognition). Work and work Astronomer. Reproductive health. Screening  You may have the following tests or measurements: Height, weight, and BMI. Blood pressure. Lipid and cholesterol levels. These may be checked every 5 years, or more frequently if you are over 60 years old. Skin check. Lung cancer screening. You may have this screening every year starting at age 11 if you have a 30-pack-year history of smoking and currently smoke or have quit within the past 15 years. Fecal occult blood test (FOBT) of the stool. You may have this test every year starting at age 58. Flexible sigmoidoscopy or colonoscopy. You may have a sigmoidoscopy every 5 years or a colonoscopy every 10 years starting at age 53. Hepatitis C blood test. Hepatitis B blood test. Sexually transmitted disease (STD) testing. Diabetes screening. This is done  by checking your blood sugar (glucose) after you have not eaten for a while (fasting). You may have this done every 1-3 years. Bone density scan. This is done to screen for osteoporosis. You may have this done starting at age 86. Mammogram. This may be done every 1-2 years. Talk to your health care provider about how often you should have regular mammograms. Talk with your health care provider about your test results, treatment options, and if necessary, the need for more  tests. Vaccines  Your health care provider may recommend certain vaccines, such as: Influenza vaccine. This is recommended every year. Tetanus, diphtheria, and acellular pertussis (Tdap, Td) vaccine. You may need a Td booster every 10 years. Zoster vaccine. You may need this after age 54. Pneumococcal 13-valent conjugate (PCV13) vaccine. One dose is recommended after age 67. Pneumococcal polysaccharide (PPSV23) vaccine. One dose is recommended after age 31. Talk to your health care provider about which screenings and vaccines you need and how often you need them. This information is not intended to replace advice given to you by your health care provider. Make sure you discuss any questions you have with your health care provider. Document Released: 06/22/2015 Document Revised: 02/13/2016 Document Reviewed: 03/27/2015 Elsevier Interactive Patient Education  2017 [place] Prevention in the Home Falls can cause injuries. They can happen to people of all ages. There are many things you can do to make your home safe and to help prevent falls. What can I do on the outside of my home? Regularly fix the edges of walkways and driveways and fix any cracks. Remove anything that might make you trip as you walk through a door, such as a raised step or threshold. Trim any bushes or trees on the path to your home. Use bright outdoor lighting. Clear any walking paths of anything that might make someone trip, such as rocks or tools. Regularly check to see if handrails are loose or broken. Make sure that both sides of any steps have handrails. Any raised decks and porches should have guardrails on the edges. Have any leaves, snow, or ice cleared regularly. Use sand or salt on walking paths during winter. Clean up any spills in your garage right away. This includes oil or grease spills. What can I do in the bathroom? Use night lights. Install grab bars by the toilet and in the tub and shower.  Do not use towel bars as grab bars. Use non-skid mats or decals in the tub or shower. If you need to sit down in the shower, use a plastic, non-slip stool. Keep the floor dry. Clean up any water that spills on the floor as soon as it happens. Remove soap buildup in the tub or shower regularly. Attach bath mats securely with double-sided non-slip rug tape. Do not have throw rugs and other things on the floor that can make you trip. What can I do in the bedroom? Use night lights. Make sure that you have a light by your bed that is easy to reach. Do not use any sheets or blankets that are too big for your bed. They should not hang down onto the floor. Have a firm chair that has side arms. You can use this for support while you get dressed. Do not have throw rugs and other things on the floor that can make you trip. What can I do in the kitchen? Clean up any spills right away. Avoid walking on wet floors. Keep items that you use  a lot in easy-to-reach places. If you need to reach something above you, use a strong step stool that has a grab bar. Keep electrical cords out of the way. Do not use floor polish or wax that makes floors slippery. If you must use wax, use non-skid floor wax. Do not have throw rugs and other things on the floor that can make you trip. What can I do with my stairs? Do not leave any items on the stairs. Make sure that there are handrails on both sides of the stairs and use them. Fix handrails that are broken or loose. Make sure that handrails are as long as the stairways. Check any carpeting to make sure that it is firmly attached to the stairs. Fix any carpet that is loose or worn. Avoid having throw rugs at the top or bottom of the stairs. If you do have throw rugs, attach them to the floor with carpet tape. Make sure that you have a light switch at the top of the stairs and the bottom of the stairs. If you do not have them, ask someone to add them for you. What else  can I do to help prevent falls? Wear shoes that: Do not have high heels. Have rubber bottoms. Are comfortable and fit you well. Are closed at the toe. Do not wear sandals. If you use a stepladder: Make sure that it is fully opened. Do not climb a closed stepladder. Make sure that both sides of the stepladder are locked into place. Ask someone to hold it for you, if possible. Clearly mark and make sure that you can see: Any grab bars or handrails. First and last steps. Where the edge of each step is. Use tools that help you move around (mobility aids) if they are needed. These include: Canes. Walkers. Scooters. Crutches. Turn on the lights when you go into a dark area. Replace any light bulbs as soon as they burn out. Set up your furniture so you have a clear path. Avoid moving your furniture around. If any of your floors are uneven, fix them. If there are any pets around you, be aware of where they are. Review your medicines with your doctor. Some medicines can make you feel dizzy. This can increase your chance of falling. Ask your doctor what other things that you can do to help prevent falls. This information is not intended to replace advice given to you by your health care provider. Make sure you discuss any questions you have with your health care provider. Document Released: 03/22/2009 Document Revised: 11/01/2015 Document Reviewed: 06/30/2014 Elsevier Interactive Patient Education  2017 [employer].

## 2022-10-21 NOTE — Progress Notes (Signed)
I connected with  Talbot Grumbling on 10/21/22 by a audio enabled telemedicine application and verified that I am speaking with the correct person using two identifiers.  Patient Location: Home  Provider Location: Office/Clinic  I discussed the limitations of evaluation and management by telemedicine. The patient expressed understanding and agreed to proceed.   Subjective:   Evelyn Jordan is a 72 y.o. female who presents for Medicare Annual (Subsequent) preventive examination.  Review of Systems     Cardiac Risk Factors include: advanced age (>72men, >74 women)     Objective:    Today's Vitals   10/21/22 1406  Weight: 120 lb (54.4 kg)   Body mass index is 21.26 kg/m.     10/21/2022    2:10 PM 10/06/2022    9:33 AM 09/08/2022    2:02 PM 09/04/2022    9:21 AM 08/19/2022    8:46 AM 08/18/2022    9:39 AM 10/03/2021   10:11 AM  Advanced Directives  Does Patient Have a Medical Advance Directive? Yes Yes Yes Yes Yes Yes Yes  Type of Estate agent of [place];Living will Healthcare Power of [place];Living will Healthcare Power of [place];Living will Healthcare Power of [employer] of [place];Living will Healthcare Power of [place];Living will Healthcare Power of Attorney  Does patient want to make changes to medical advance directive? No - Patient declined  No - Patient declined No - Patient declined No - Patient declined No - Patient declined   Copy of Healthcare Power of Attorney in Chart? Yes - validated most recent copy scanned in chart (See row information) Yes - validated most recent copy scanned in chart (See row information) Yes - validated most recent copy scanned in chart (See row information)  No - copy requested  Yes - validated most recent copy scanned in chart (See row information)    Current Medications (verified) Outpatient Encounter Medications as of 10/21/2022  Medication Sig   albuterol (PROVENTIL) (2.5 MG/3ML) 0.083% nebulizer  solution Take 3 mLs (2.5 mg total) by nebulization every 6 (six) hours as needed for wheezing or shortness of breath.   albuterol (VENTOLIN HFA) 108 (90 Base) MCG/ACT inhaler Inhale 2 puffs into the lungs every 6 (six) hours as needed for wheezing or shortness of breath.   ALPRAZolam (XANAX) 0.5 MG tablet Take 1 tablet (0.5 mg total) by mouth at bedtime as needed for anxiety or sleep.   docusate sodium (COLACE) 100 MG capsule Take 100 mg by mouth every other day.   lidocaine-prilocaine (EMLA) cream Apply 1 Application topically as needed.   [DISCONTINUED] HYDROcodone bit-homatropine (HYCODAN) 5-1.5 MG/5ML syrup Take 5 mLs by mouth every 6 (six) hours as needed for cough.   [DISCONTINUED] lidocaine (XYLOCAINE) 2 % solution Use as directed 15 mLs in the mouth or throat 4 (four) times daily -  before meals and at bedtime. Swallow 15-20 mins prior to meals   [DISCONTINUED] ondansetron (ZOFRAN) 8 MG tablet Take 1 tablet (8 mg total) by mouth every 8 (eight) hours as needed for nausea or vomiting.   [DISCONTINUED] sucralfate (CARAFATE) 1 g tablet Take 1 tablet (1 g total) by mouth 4 (four) times daily -  with meals and at bedtime. 5 min before meals for radiation induced esophagitis   [DISCONTINUED] Tiotropium Bromide Monohydrate (SPIRIVA RESPIMAT) 2.5 MCG/ACT AERS Inhale 2 puffs into the lungs daily.   No facility-administered encounter medications on file as of 10/21/2022.    Allergies (verified) Compazine [prochlorperazine edisylate]   History: Past  Medical History:  Diagnosis Date   Depression    Insomnia    Lung cancer (HCC) 07/25/2022   Osteoporosis    Pulmonary nodule    Thyroid nodule    Past Surgical History:  Procedure Laterality Date   ANKLE SURGERY Right    BREAST BIOPSY Right    benign 2013   BRONCHIAL BIOPSY  08/04/2022   Procedure: BRONCHIAL BIOPSIES;  Surgeon: Leslye Peer, MD;  [facility] ENDOSCOPY;  Service: Cardiopulmonary;;   BRONCHIAL BRUSHINGS  08/04/2022    Procedure: BRONCHIAL BRUSHINGS;  Surgeon: Leslye Peer, MD;  Location: [facility] ENDOSCOPY;  Service: Cardiopulmonary;;   HEMOSTASIS CONTROL  08/04/2022   Procedure: HEMOSTASIS CONTROL;  Surgeon: Leslye Peer, MD;  Location: [facility] ENDOSCOPY;  Service: Cardiopulmonary;;   IR IMAGING GUIDED PORT INSERTION  09/04/2022   repair of a torn meniscus Left 05/2018   VIDEO BRONCHOSCOPY Left 08/04/2022   Procedure: VIDEO BRONCHOSCOPY WITH FLUORO;  Surgeon: Leslye Peer, MD;  Location[facility] ENDOSCOPY;  Service: Cardiopulmonary;  Laterality: Left;   Family History  Problem Relation Age of Onset   Alzheimer's disease Mother    Hypertension Father    Stroke Father    Breast cancer Neg Hx    Social History   Socioeconomic History   Marital status: Married    Spouse name: Archie   Number of children: Not on file   Years of education: Not on file   Highest education level: Not on file  Occupational History    Comment: retired Engineer, civil (consulting)   Tobacco Use   Smoking status: Former    Packs/day: 1.00    Years: 40.00    Additional pack years: 0.00    Total pack years: 40.00    Types: Cigarettes    Start date: 63    Quit date: 2012    Years since quitting: 12.3   Smokeless tobacco: Never  Vaping Use   Vaping Use: Never used  Substance and Sexual Activity   Alcohol use: Yes    Comment: Rarely   Drug use: No   Sexual activity: Not on file  Other Topics Concern   Not on file  Social History Narrative   Not on file   Social Determinants of Health   Financial Resource Strain: Low Risk  (10/21/2022)   Overall Financial Resource Strain (CARDIA)    Difficulty of Paying Living Expenses: Not hard at all  Food Insecurity: No Food Insecurity (10/21/2022)   Hunger Vital Sign    Worried About Running Out of Food in the Last Year: Never true    Ran Out of Food in the Last Year: Never true  Transportation Needs: No Transportation Needs (10/21/2022)   PRAPARE - Administrator, Civil Service (Medical):  No    Lack of Transportation (Non-Medical): No  Physical Activity: Insufficiently Active (10/21/2022)   Exercise Vital Sign    Days of Exercise per Week: 7 days    Minutes of Exercise per Session: 20 min  Stress: No Stress Concern Present (10/21/2022)   [employer] of Occupational Health - Occupational Stress Questionnaire    Feeling of Stress : Not at all  Social Connections: Moderately Isolated (10/21/2022)   Social Connection and Isolation Panel [NHANES]    Frequency of Communication with Friends and Family: More than three times a week    Frequency of Social Gatherings with Friends and Family: More than three times a week    Attends Religious Services: Never    Active Member  of Clubs or Organizations: No    Attends Banker Meetings: Never    Marital Status: Married    Tobacco Counseling Counseling given: Not Answered   Clinical Intake:  Pre-visit preparation completed: Yes  Pain : No/denies pain     BMI - recorded: 21.26 Nutritional Status: BMI of 19-24  Normal Nutritional Risks: None Diabetes: No  How often do you need to have someone help you when you read instructions, pamphlets, or other written materials from your doctor or pharmacy?: 1 - Never  Diabetic?no  Interpreter Needed?: No  Information entered by :: Lanier Ensign, LPN   Activities of Daily Living    10/21/2022    2:12 PM 09/04/2022    9:20 AM  In your present state of health, do you have any difficulty performing the following activities:  Hearing? 0 0  Vision? 0 0  Difficulty concentrating or making decisions? 0 0  Walking or climbing stairs? 0 0  Dressing or bathing? 0 0  Doing errands, shopping? 0   Preparing Food and eating ? N   Using the Toilet? N   In the past six months, have you accidently leaked urine? N   Comment at times   Do you have problems with loss of bowel control? N   Managing your Medications? N   Managing your Finances? N   Housekeeping or managing  your Housekeeping? N     Patient Care Team: [name], MD as PCP - General (Family Medicine) August Saucer Corrie Mckusick, MD as Consulting Physician (Orthopedic Surgery) Canary Brim, OD as Consulting Physician (Ophthalmology) Leslye Peer, MD as Consulting Physician (Pulmonary Disease)  Indicate any recent Medical Services you may have received from other than [facility] providers in the past year (date may be approximate).     Assessment:   This is a routine wellness examination for Story.  Hearing/Vision screen Hearing Screening - Comments:: Pt denies any hearing issues  Vision Screening - Comments:: Pt follows up with Dr [place] for annual eye exaMS   Dietary issues and exercise activities discussed: Current Exercise Habits: Home exercise routine, Type of exercise: Other - see comments, Time (Minutes): 20, Frequency (Times/Week): 7, Weekly Exercise (Minutes/Week): 140   Goals Addressed             This Visit's Progress    Patient Stated       Stay healthy with        Depression Screen    10/21/2022    2:11 PM 08/19/2022    9:28 AM 07/22/2022   10:24 AM 03/24/2022   10:38 AM 10/03/2021   10:10 AM 04/22/2021    3:33 PM 02/18/2021    3:00 PM  PHQ 2/9 Scores  PHQ - 2 Score 0 0 0 0 0 0 4  PHQ- 9 Score      3 12    Fall Risk    10/21/2022    2:12 PM 07/22/2022   10:24 AM 03/24/2022   10:38 AM 10/03/2021   10:12 AM 06/14/2020    9:25 AM  Fall Risk   Falls in the past year? 0 0 0 0 0  Number falls in past yr: 0 0 0 0 0  Injury with Fall? 0 0 0 0 0  Risk for fall due to : Impaired vision No Fall Risks No Fall Risks Impaired vision   Follow up Falls prevention discussed Falls evaluation completed Falls evaluation completed Falls prevention discussed     FALL RISK  PREVENTION PERTAINING TO THE HOME:  Any stairs in or around the home? Yes  If so, are there any without handrails? No  Home free of loose throw rugs in walkways, pet beds, electrical cords, etc? Yes   Adequate lighting in your home to reduce risk of falls? Yes   ASSISTIVE DEVICES UTILIZED TO PREVENT FALLS:  Life alert? No  Use of a cane, walker or w/c? No  Grab bars in the bathroom? No  Shower chair or bench in shower? No  Elevated toilet seat or a handicapped toilet? No   TIMED UP AND GO:  Was the test performed? No .   Cognitive Function:        10/21/2022    2:15 PM 10/03/2021   10:13 AM 04/05/2020    8:48 AM 03/07/2019   10:25 AM  6CIT Screen  What Year? 0 points 0 points 0 points 0 points  What month? 0 points 0 points 0 points 0 points  What time? 0 points 0 points  0 points  Count back from 20 0 points 0 points 0 points 0 points  Months in reverse 0 points 0 points 0 points 0 points  Repeat phrase 0 points 0 points 0 points 0 points  Total Score 0 points 0 points  0 points    Immunizations Immunization History  Administered Date(s) Administered   Fluad Quad(high Dose 65+) 03/07/2019, 03/20/2020, 04/12/2021, 03/20/2022   Hepatitis A 09/03/2006, 06/15/2007   IPV 06/15/2007   Influenza,inj,Quad PF,6+ Mos 07/07/2018   Influenza-Unspecified 04/24/2017   MMR 09/03/2006   Meningococcal Conjugate 06/15/2007   PFIZER(Purple Top)SARS-COV-2 Vaccination 08/20/2019, 09/13/2019, 03/20/2020   Pfizer Covid-19 Vaccine Bivalent Booster 31yrs & up 03/23/2022   Pneumococcal Conjugate-13 12/02/2017   Pneumococcal Polysaccharide-23 04/08/2013, 03/23/2019   Tdap 09/03/2006, 12/23/2017   Typhoid Inactivated 09/03/2006   Yellow Fever 09/03/2006   Zoster Recombinat (Shingrix) 05/17/2018, 10/19/2018   Zoster, Live 09/16/2013    TDAP status: Up to date  Flu Vaccine status: Up to date  Pneumococcal vaccine status: Up to date  Covid-19 vaccine status: Completed vaccines  Qualifies for Shingles Vaccine? Yes   Zostavax completed Yes   Shingrix Completed?: Yes  Screening Tests Health Maintenance  Topic Date Due   COVID-19 Vaccine (5 - 2023-24 season) 05/18/2022   DEXA  SCAN  10/31/2022   INFLUENZA VACCINE  01/08/2023   MAMMOGRAM  08/01/2023   Medicare Annual Wellness (AWV)  10/21/2023   COLONOSCOPY (Pts 45-36yrs Insurance coverage will need to be confirmed)  08/13/2024   DTaP/Tdap/Td (3 - Td or Tdap) 12/24/2027   Pneumonia Vaccine 29+ Years old  Completed   Hepatitis C Screening  Completed   Zoster Vaccines- Shingrix  Completed   HPV VACCINES  Aged Out   Lung Cancer Screening  Discontinued    Health Maintenance  Health Maintenance Due  Topic Date Due   COVID-19 Vaccine (5 - 2023-24 season) 05/18/2022    Colorectal cancer screening: Type of screening: Colonoscopy. Completed 08/14/14. Repeat every 10 years  Mammogram status: Completed 07/31/22. Repeat every year  Bone Density status: Completed 10/30/20. Results reflect: Bone density results: OSTEOPENIA. Repeat every 2 years.  Lung Cancer Screening: (Low Dose CT Chest recommended if Age 58-80 years, 30 pack-year currently smoking OR have quit w/in 15years.) does qualify.   Lung Cancer Screening Referral: Scheduled for 11/04/22  Additional Screening:  Hepatitis C Screening: Completed 06/14/20  Vision Screening: Recommended annual ophthalmology exams for early detection of glaucoma and other disorders of the eye. Is  the patient up to date with their annual eye exam?  Yes  Who is the provider or what is the name of the office in which the patient attends annual eye exams? [facility]  If pt is not established with a provider, would they like to be referred to a provider to establish care? No .   Dental Screening: Recommended annual dental exams for proper oral hygiene  Community Resource Referral / Chronic Care Management: CRR required this visit?  No   CCM required this visit?  No      Plan:     I have personally reviewed and noted the following in the patient's chart:   Medical and social history Use of alcohol, tobacco or illicit drugs  Current medications and supplements including opioid  prescriptions. Patient is not currently taking opioid prescriptions. Functional ability and status Nutritional status Physical activity Advanced directives List of other physicians Hospitalizations, surgeries, and ER visits in previous 12 months Vitals Screenings to include cognitive, depression, and falls Referrals and appointments  In addition, I have reviewed and discussed with patient certain preventive protocols, quality metrics, and best practice recommendations. A written personalized care plan for preventive services as well as general preventive health recommendations were provided to patient.     Marzella Schlein, LPN   [id number]/62/[id number]   Nurse Notes: none

## 2022-10-22 ENCOUNTER — Other Ambulatory Visit: Payer: Self-pay

## 2022-11-04 ENCOUNTER — Inpatient Hospital Stay: Payer: Medicare Other | Attending: Internal Medicine

## 2022-11-04 ENCOUNTER — Ambulatory Visit (HOSPITAL_COMMUNITY)
Admission: RE | Admit: 2022-11-04 | Discharge: 2022-11-04 | Disposition: A | Discharge status: Home / Self Care | Payer: Medicare Other | Source: Ambulatory Visit | Attending: Internal Medicine | Admitting: Internal Medicine

## 2022-11-04 ENCOUNTER — Inpatient Hospital Stay: Payer: Medicare Other

## 2022-11-04 DIAGNOSIS — I700 Atherosclerosis of aorta: Secondary | ICD-10-CM | POA: Insufficient documentation

## 2022-11-04 DIAGNOSIS — C3490 Malignant neoplasm of unspecified part of unspecified bronchus or lung: Secondary | ICD-10-CM

## 2022-11-04 DIAGNOSIS — Z95828 Presence of other vascular implants and grafts: Secondary | ICD-10-CM

## 2022-11-04 DIAGNOSIS — J90 Pleural effusion, not elsewhere classified: Secondary | ICD-10-CM | POA: Diagnosis not present

## 2022-11-04 DIAGNOSIS — I2510 Atherosclerotic heart disease of native coronary artery without angina pectoris: Secondary | ICD-10-CM | POA: Diagnosis not present

## 2022-11-04 DIAGNOSIS — Z923 Personal history of irradiation: Secondary | ICD-10-CM | POA: Diagnosis not present

## 2022-11-04 DIAGNOSIS — M47814 Spondylosis without myelopathy or radiculopathy, thoracic region: Secondary | ICD-10-CM | POA: Insufficient documentation

## 2022-11-04 DIAGNOSIS — C3412 Malignant neoplasm of upper lobe, left bronchus or lung: Secondary | ICD-10-CM | POA: Insufficient documentation

## 2022-11-04 DIAGNOSIS — Z08 Encounter for follow-up examination after completed treatment for malignant neoplasm: Secondary | ICD-10-CM | POA: Diagnosis not present

## 2022-11-04 DIAGNOSIS — Z9221 Personal history of antineoplastic chemotherapy: Secondary | ICD-10-CM | POA: Diagnosis not present

## 2022-11-04 LAB — CBC WITH DIFFERENTIAL (CANCER CENTER ONLY)
Abs Immature Granulocytes: 0.01 10*3/uL (ref 0.00–0.07)
Basophils Absolute: 0 10*3/uL (ref 0.0–0.1)
Basophils Relative: 1 %
Eosinophils Absolute: 0 10*3/uL (ref 0.0–0.5)
Eosinophils Relative: 1 %
HCT: 34.1 % — ABNORMAL LOW (ref 36.0–46.0)
Hemoglobin: 11.9 g/dL — ABNORMAL LOW (ref 12.0–15.0)
Immature Granulocytes: 0 %
Lymphocytes Relative: 10 %
Lymphs Abs: 0.4 10*3/uL — ABNORMAL LOW (ref 0.7–4.0)
MCH: 32.7 pg (ref 26.0–34.0)
MCHC: 34.9 g/dL (ref 30.0–36.0)
MCV: 93.7 fL (ref 80.0–100.0)
Monocytes Absolute: 0.5 10*3/uL (ref 0.1–1.0)
Monocytes Relative: 12 %
Neutro Abs: 3 10*3/uL (ref 1.7–7.7)
Neutrophils Relative %: 76 %
Platelet Count: 158 10*3/uL (ref 150–400)
RBC: 3.64 MIL/uL — ABNORMAL LOW (ref 3.87–5.11)
RDW: 19.7 % — ABNORMAL HIGH (ref 11.5–15.5)
WBC Count: 4 10*3/uL (ref 4.0–10.5)
nRBC: 0 % (ref 0.0–0.2)

## 2022-11-04 LAB — CMP (CANCER CENTER ONLY)
ALT: 32 U/L (ref 0–44)
AST: 30 U/L (ref 15–41)
Albumin: 3.8 g/dL (ref 3.5–5.0)
Alkaline Phosphatase: 57 U/L (ref 38–126)
Anion gap: 5 (ref 5–15)
BUN: 12 mg/dL (ref 8–23)
CO2: 26 mmol/L (ref 22–32)
Calcium: 8.9 mg/dL (ref 8.9–10.3)
Chloride: 109 mmol/L (ref 98–111)
Creatinine: 0.7 mg/dL (ref 0.44–1.00)
GFR, Estimated: >60 mL/min (ref >60)
Glucose, Bld: 96 mg/dL (ref 70–99)
Potassium: 4 mmol/L (ref 3.5–5.1)
Sodium: 140 mmol/L (ref 135–145)
Total Bilirubin: 0.3 mg/dL (ref 0.3–1.2)
Total Protein: 6.3 g/dL — ABNORMAL LOW (ref 6.5–8.1)

## 2022-11-04 MED ORDER — IOHEXOL 300 MG/ML  SOLN
75.0000 mL | Freq: Once | INTRAMUSCULAR | Status: AC | PRN
  Administered 2022-11-04: 75 mL via INTRAVENOUS

## 2022-11-04 MED ORDER — HEPARIN SOD (PORK) LOCK FLUSH 100 UNIT/ML IV SOLN
INTRAVENOUS | Status: AC
  Filled 2022-11-04: qty 5

## 2022-11-04 MED ORDER — HEPARIN SOD (PORK) LOCK FLUSH 100 UNIT/ML IV SOLN: 500.0000 [IU] | Freq: Once | INTRAVENOUS | Status: DC

## 2022-11-04 MED ORDER — HEPARIN SOD (PORK) LOCK FLUSH 100 UNIT/ML IV SOLN
500.0000 [IU] | Freq: Once | INTRAVENOUS | Status: AC
  Administered 2022-11-04: 500 [IU] via INTRAVENOUS

## 2022-11-04 MED ORDER — SODIUM CHLORIDE (PF) 0.9 % IJ SOLN
INTRAMUSCULAR | Status: AC
  Filled 2022-11-04: qty 50

## 2022-11-04 MED ORDER — SODIUM CHLORIDE 0.9% FLUSH
10.0000 mL | Freq: Once | INTRAVENOUS | Status: AC
  Administered 2022-11-04: 10 mL

## 2022-11-06 ENCOUNTER — Encounter: Payer: Self-pay | Admitting: Internal Medicine

## 2022-11-06 ENCOUNTER — Inpatient Hospital Stay (HOSPITAL_BASED_OUTPATIENT_CLINIC_OR_DEPARTMENT_OTHER): Payer: Medicare Other | Admitting: Internal Medicine

## 2022-11-06 VITALS — BP 154/91 | HR 80 | Temp 97.8°F | Resp 18 | Ht 63.0 in | Wt 120.9 lb

## 2022-11-06 DIAGNOSIS — C3490 Malignant neoplasm of unspecified part of unspecified bronchus or lung: Secondary | ICD-10-CM | POA: Diagnosis not present

## 2022-11-06 DIAGNOSIS — Z9221 Personal history of antineoplastic chemotherapy: Secondary | ICD-10-CM | POA: Diagnosis not present

## 2022-11-06 DIAGNOSIS — C3412 Malignant neoplasm of upper lobe, left bronchus or lung: Secondary | ICD-10-CM | POA: Diagnosis not present

## 2022-11-06 DIAGNOSIS — J90 Pleural effusion, not elsewhere classified: Secondary | ICD-10-CM | POA: Diagnosis not present

## 2022-11-06 DIAGNOSIS — Z08 Encounter for follow-up examination after completed treatment for malignant neoplasm: Secondary | ICD-10-CM | POA: Diagnosis not present

## 2022-11-06 DIAGNOSIS — I2510 Atherosclerotic heart disease of native coronary artery without angina pectoris: Secondary | ICD-10-CM | POA: Diagnosis not present

## 2022-11-06 DIAGNOSIS — Z923 Personal history of irradiation: Secondary | ICD-10-CM | POA: Diagnosis not present

## 2022-11-06 NOTE — Progress Notes (Signed)
DISCONTINUE ON PATHWAY REGIMEN - Non-Small Cell Lung     A cycle is every 7 days, concurrent with RT:     Paclitaxel      Carboplatin   **Always confirm dose/schedule in your pharmacy ordering system**  REASON: Continuation Of Treatment PRIOR TREATMENT: [id number]: Carboplatin AUC=2 + Paclitaxel 45 mg/m2 Weekly During Radiation TREATMENT RESPONSE: Partial Response (PR)  START ON PATHWAY REGIMEN - Non-Small Cell Lung     A cycle is every 28 days:     Durvalumab   **Always confirm dose/schedule in your pharmacy ordering system**  Patient Characteristics: Preoperative or Nonsurgical Candidate (Clinical Staging), Stage III - Nonsurgical Candidate (Nonsquamous and Squamous), PS = 0, 1 Therapeutic Status: Preoperative or Nonsurgical Candidate (Clinical Staging) AJCC T Category: cT3 AJCC N Category: cN2 AJCC M Category: cM0 AJCC 8 Stage Grouping: IIIB ECOG Performance Status: 1 Intent of Therapy: Curative Intent, Discussed with Patient 

## 2022-11-06 NOTE — Progress Notes (Signed)
[facility] [phone]   [phone]  OFFICE PROGRESS NOTE  Evelyn Ora, MD [address] [place] [address]  DIAGNOSIS: Stage IIIB (T3, N2, M0) non-small cell lung cancer, squamous cell carcinoma presented with obstructive left lingular mass with consolidation and collapse of the lingula as well as part of the left upper lobe and likely invasion of the mediastinum diagnosed in February 2024.   Biomarker Findings Microsatellite status - MS-Stable Tumor Mutational Burden - 5 Muts/Mb Genomic Findings For a complete list of the genes assayed, please refer to the Appendix. CCND1 amplification FGFR1 amplification MDM2 amplification FGF19 amplification FGF3 amplification FGF4 amplification TP53 Q136* 8 Disease relevant genes with no reportable alterations: ALK, BRAF, EGFR, ERBB2, KRAS, MET, RET, ROS1  PDL1: 5%   PRIOR THERAPY: Concurrent chemoradiation with weekly carboplatin for AUC of 2 and paclitaxel 45 Mg/M2. Status post 7 weeks of treatment.  Last dose was given October 06, 2022 with partial response.   CURRENT THERAPY: Consolidation treatment with immunotherapy with Imfinzi 1500 Mg IV every 4 weeks.  First dose on November 13, 2022  INTERVAL HISTORY: Evelyn Jordan 72 y.o. female returns to the clinic today for follow-up visit accompanied by her husband and her daughter Marcelino Duster.  The patient is feeling fine today with no concerning complaints except for the mild fatigue and soreness in her throat from the previous radiotherapy.  She denied having any current chest pain, shortness of breath but has mild cough with no hemoptysis.  She has no nausea, vomiting, diarrhea or constipation.  She has no headache or visual changes.  She denied having any recent weight loss or night sweats.  She enjoyed her vacation at [place] with the grandchildren.  She had repeat CT scan of the chest performed recently and she is here for evaluation and  discussion of her scan results.  MEDICAL HISTORY: Past Medical History:  Diagnosis Date   Depression    Insomnia    Lung cancer (HCC) 07/25/2022   Osteoporosis    Pulmonary nodule    Thyroid nodule     ALLERGIES:  is allergic to compazine [prochlorperazine edisylate].  MEDICATIONS:  Current Outpatient Medications  Medication Sig Dispense Refill   albuterol (PROVENTIL) (2.5 MG/3ML) 0.083% nebulizer solution Take 3 mLs (2.5 mg total) by nebulization every 6 (six) hours as needed for wheezing or shortness of breath. 75 mL 12   albuterol (VENTOLIN HFA) 108 (90 Base) MCG/ACT inhaler Inhale 2 puffs into the lungs every 6 (six) hours as needed for wheezing or shortness of breath. 8 g 6   ALPRAZolam (XANAX) 0.5 MG tablet Take 1 tablet (0.5 mg total) by mouth at bedtime as needed for anxiety or sleep. 30 tablet 5   docusate sodium (COLACE) 100 MG capsule Take 100 mg by mouth every other day.     lidocaine-prilocaine (EMLA) cream Apply 1 Application topically as needed. 30 g 2   No current facility-administered medications for this visit.    SURGICAL HISTORY:  Past Surgical History:  Procedure Laterality Date   ANKLE SURGERY Right    BREAST BIOPSY Right    benign 2013   BRONCHIAL BIOPSY  08/04/2022   Procedure: BRONCHIAL BIOPSIES;  Surgeon: Leslye Peer, MD;  Location: [facility] ENDOSCOPY;  Service: Cardiopulmonary;;   BRONCHIAL BRUSHINGS  08/04/2022   Procedure: BRONCHIAL BRUSHINGS;  Surgeon: Leslye Peer, MD;  Location: [facility] ENDOSCOPY;  Service: Cardiopulmonary;;   HEMOSTASIS CONTROL  08/04/2022   Procedure:  HEMOSTASIS CONTROL;  Surgeon: Leslye Peer, MD;  Location: [facility] ENDOSCOPY;  Service: Cardiopulmonary;;   IR IMAGING GUIDED PORT INSERTION  09/04/2022   repair of a torn meniscus Left 05/2018   VIDEO BRONCHOSCOPY Left 08/04/2022   Procedure: VIDEO BRONCHOSCOPY WITH FLUORO;  Surgeon: Leslye Peer, MD;  Location: [facility] ENDOSCOPY;  Service: Cardiopulmonary;  Laterality: Left;    REVIEW  OF SYSTEMS:  Constitutional: positive for fatigue Eyes: negative Ears, nose, mouth, throat, and face: negative Respiratory: positive for cough Cardiovascular: negative Gastrointestinal: negative Genitourinary:negative Integument/breast: negative Hematologic/lymphatic: negative Musculoskeletal:negative Neurological: negative Behavioral/Psych: negative Endocrine: negative Allergic/Immunologic: negative   PHYSICAL EXAMINATION: General appearance: alert, cooperative, and no distress Head: Normocephalic, without obvious abnormality, atraumatic Neck: no adenopathy, no JVD, supple, symmetrical, trachea midline, and thyroid not enlarged, symmetric, no tenderness/mass/nodules Lymph nodes: Cervical, supraclavicular, and axillary nodes normal. Resp: clear to auscultation bilaterally Back: symmetric, no curvature. ROM normal. No CVA tenderness. Cardio: regular rate and rhythm, S1, S2 normal, no murmur, click, rub or gallop GI: soft, non-tender; bowel sounds normal; no masses,  no organomegaly Extremities: extremities normal, atraumatic, no cyanosis or edema Neurologic: Alert and oriented X 3, normal strength and tone. Normal symmetric reflexes. Normal coordination and gait  ECOG PERFORMANCE STATUS: 1 - Symptomatic but completely ambulatory  Blood pressure (!) 154/91, pulse 80, temperature 97.8 F (36.6 C), temperature source Oral, resp. rate 18, height 5\' 3"  (1.6 m), weight 120 lb 14.4 oz (54.8 kg), SpO2 100 %.  LABORATORY DATA: Lab Results  Component Value Date   WBC 4.0 11/04/2022   HGB 11.9 (L) 11/04/2022   HCT 34.1 (L) 11/04/2022   MCV 93.7 11/04/2022   PLT 158 11/04/2022      Chemistry      Component Value Date/Time   NA 140 11/04/2022 1025   NA 141 03/07/2015 0000   K 4.0 11/04/2022 1025   CL 109 11/04/2022 1025   CO2 26 11/04/2022 1025   BUN 12 11/04/2022 1025   CREATININE 0.70 11/04/2022 1025   GLU 103 03/07/2015 0000      Component Value Date/Time   CALCIUM 8.9  11/04/2022 1025   ALKPHOS 57 11/04/2022 1025   AST 30 11/04/2022 1025   ALT 32 11/04/2022 1025   BILITOT 0.3 11/04/2022 1025       RADIOGRAPHIC STUDIES: CT Chest W Contrast  Result Date: 11/05/2022 CLINICAL DATA:  Non-small cell lung cancer staging. Prior chemotherapy and radiation therapy. * Tracking Code: BO * EXAM: CT CHEST WITH CONTRAST TECHNIQUE: Multidetector CT imaging of the chest was performed during intravenous contrast administration. RADIATION DOSE REDUCTION: This exam was performed according to the departmental dose-optimization program which includes automated exposure control, adjustment of the mA and/or kV according to patient size and/or use of iterative reconstruction technique. CONTRAST:  75mL OMNIPAQUE IOHEXOL 300 MG/ML  SOLN COMPARISON:  07/24/2022 and PET-CT from 08/13/2022 FINDINGS: Cardiovascular: Right Port-A-Cath tip: Cavoatrial junction. Left main, left anterior descending, and circumflex coronary atherosclerosis. Mild aortic atherosclerosis. Mediastinum/Nodes: Mild diffuse wall thickening in the esophagus, nonspecific although esophagitis would be a common cause. No pathologic adenopathy observed. Lungs/Pleura: Trace left pleural effusion. Continued volume loss in the lingula, with hypodense central density proceeding the atelectatic portion of the lung measuring about 2.8 by 2.6 cm on image 72 series 2, compared to previous approximally 4.4 by 4.1 cm on 07/24/2022, compatible with reduced size of the lingular mass. There continues to be occlusion of the lingular bronchus. Mild narrowing of the left lower lobe bronchus for  example on image 72 series 6. Patchy ground-glass opacities are present peripherally in the left upper lobe and portions of the left lower lobe, possibly related to radiation therapy or postobstructive pneumonitis. Peripheral reticular interstitial accentuation in significant portions of the lungs. Old granulomatous disease with several bilateral calcified  pulmonary nodules which appear benign. Upper Abdomen: Unremarkable Musculoskeletal: Thoracic spondylosis and degenerative disc disease most notable at the T7-8 level. IMPRESSION: 1. Reduced size of the lingular mass, currently 2.8 by 2.6 cm compared to previous 4.4 by 4.1 cm on 07/24/2022. 2. Continued occlusion of the lingular bronchus with some mild narrowing of the left lower lobe bronchus. 3. Patchy ground-glass opacities peripherally in the left upper lobe and portions of the left lower lobe, possibly related to radiation therapy and/or postobstructive pneumonitis. 4. Trace left pleural effusion. 5. Mild diffuse wall thickening in the esophagus, nonspecific although esophagitis would be a common cause. 6. Aortic and coronary atherosclerosis. Aortic Atherosclerosis (ICD10-I70.0). Electronically Signed   By: Gaylyn Rong M.D.   On: 11/05/2022 17:57    ASSESSMENT AND PLAN: This is a very pleasant 72 years old white female with Stage IIIB (T3, N2, M0) non-small cell lung cancer, squamous cell carcinoma presented with obstructive left lingular mass with consolidation and collapse of the lingula as well as part of the left upper lobe and likely invasion of the mediastinum diagnosed in February 2024.  Molecular studies by foundation 1 showed no actionable mutations PDL1: 5% The patient underwent a course of concurrent chemoradiation with weekly carboplatin for AUC of 2 and paclitaxel 45 Mg/M2 status post 7  cycles.  Last dose was given on October 06, 2022 with partial response. The patient tolerated the previous course of her treatment fairly well with no concerning adverse effect except for the fatigue and the radiation-induced esophagitis. She had repeat CT scan of the chest performed recently.  I personally and independently reviewed the scan and discussed the results with the patient and her family. Her scan showed improvement of her disease but she had continued occlusion of the lingular bronchus with  some mild narrowing of the left lower lobe bronchus.  She also has patchy groundglass opacities likely secondary to radiation treatment. I discussed with the patient the role of consolidation treatment with immunotherapy after the course of concurrent chemoradiation with the improvement in progression free survival as well as overall 5-year survival. The patient is interested in this treatment and she will be treated with consolidation immunotherapy with Imfinzi 1500 Mg IV weekly every 4 weeks for a total of 1 year if she has no evidence for disease recurrence or unacceptable toxicity. The patient is expected to start the first cycle of this treatment next week.  I discussed with her the adverse effect of the immunotherapy including but not limited to immunotherapy mediated skin rash, diarrhea, inflammation of the lung, kidney, liver, thyroid or other endocrine dysfunction including type 1 diabetes mellitus. The patient will come back for follow-up visit in 5 weeks for evaluation with the start of cycle #2. She was advised to call immediately if she has any other concerning symptoms in the interval.  The patient voices understanding of current disease status and treatment options and is in agreement with the current care plan.  All questions were answered. The patient knows to call the clinic with any problems, questions or concerns. We can certainly see the patient much sooner if necessary.  The total time spent in the appointment was 30 minutes.  Disclaimer: This note  was dictated with voice recognition software. Similar sounding words can inadvertently be transcribed and may not be corrected upon review.

## 2022-11-07 ENCOUNTER — Encounter: Payer: Self-pay | Admitting: Internal Medicine

## 2022-11-07 ENCOUNTER — Other Ambulatory Visit: Payer: Self-pay | Admitting: Physician Assistant

## 2022-11-07 ENCOUNTER — Encounter: Payer: Self-pay | Admitting: Family Medicine

## 2022-11-07 ENCOUNTER — Telehealth: Payer: Self-pay | Admitting: Internal Medicine

## 2022-11-07 ENCOUNTER — Telehealth: Payer: Self-pay | Admitting: Physician Assistant

## 2022-11-07 ENCOUNTER — Encounter: Payer: Self-pay | Admitting: Medical Oncology

## 2022-11-07 DIAGNOSIS — R059 Cough, unspecified: Secondary | ICD-10-CM

## 2022-11-07 MED ORDER — AZITHROMYCIN 250 MG PO TABS: ORAL_TABLET | ORAL | 0 refills | Status: DC

## 2022-11-07 NOTE — Telephone Encounter (Signed)
Patient is aware of upcoming appointments. °

## 2022-11-07 NOTE — Telephone Encounter (Signed)
The patient had sent several MyChart messages with questions about her upcoming consolidation immunotherapy.  I called the patient's we can discuss this in more detail.  We discussed adverse side effects of treatment, ideally to start immunotherapy within 6 weeks of completion of chemoradiation, survival benefit of immunotherapy, PD-L1 expression, surveillance imaging studies, and duration of treatment.  The patient mentions that she has been coughing up blood-tinged mucus the last 2 days or so.  She does not take any blood thinners.  She had come back from [place] recently and overexerted herself and had a cough.  Her cough has improved.  To prior.  She recently had a CT scan that showed some radiation pneumonitis.  Would not be ideal to send steroids due to upcoming immunotherapy.  Did send her azithromycin.  The patient understands if she develops any significant hemoptysis to seek emergency room evaluation.  She knows stay away from any blood thinners or aspirin.  I answered the patient's questions to her satisfaction.  Talk to scheduling and we will work on scheduling her for cycle of treatment next week.

## 2022-11-10 ENCOUNTER — Encounter: Payer: Self-pay | Admitting: Physician Assistant

## 2022-11-10 ENCOUNTER — Telehealth: Payer: Self-pay | Admitting: Medical Oncology

## 2022-11-10 DIAGNOSIS — H04123 Dry eye syndrome of bilateral lacrimal glands: Secondary | ICD-10-CM | POA: Diagnosis not present

## 2022-11-10 DIAGNOSIS — H25013 Cortical age-related cataract, bilateral: Secondary | ICD-10-CM | POA: Diagnosis not present

## 2022-11-10 DIAGNOSIS — H2513 Age-related nuclear cataract, bilateral: Secondary | ICD-10-CM | POA: Diagnosis not present

## 2022-11-10 DIAGNOSIS — H43393 Other vitreous opacities, bilateral: Secondary | ICD-10-CM | POA: Diagnosis not present

## 2022-11-10 DIAGNOSIS — H2511 Age-related nuclear cataract, right eye: Secondary | ICD-10-CM | POA: Diagnosis not present

## 2022-11-10 DIAGNOSIS — H25043 Posterior subcapsular polar age-related cataract, bilateral: Secondary | ICD-10-CM | POA: Diagnosis not present

## 2022-11-10 NOTE — Telephone Encounter (Signed)
"  Please schedule my third immunotherapy infusion for July 24,25, 26 ( week early) or August 8 or 9 ( a week late) due to prearranged travel plans. Thank you".

## 2022-11-11 ENCOUNTER — Other Ambulatory Visit: Payer: Self-pay | Admitting: Physician Assistant

## 2022-11-12 ENCOUNTER — Encounter: Payer: Self-pay | Admitting: Internal Medicine

## 2022-11-12 NOTE — Progress Notes (Unsigned)
Symptom Management Consult Note [facility]    Patient Care Team: [name], MD as PCP - General (Family Medicine) August Saucer, Corrie Mckusick, MD as Consulting Physician (Orthopedic Surgery) Canary Brim, OD as Consulting Physician (Ophthalmology) Leslye Peer, MD as Consulting Physician (Pulmonary Disease)    Name / MRN / DOB: Evelyn Jordan  [id number]  [birth year]   Date of visit: 11/13/2022   Chief Complaint/Reason for visit: cough   Current Therapy: Starting Imfinzi today   ASSESSMENT & PLAN: Patient is a 72 y.o. female  with oncologic history of Stage IIIB (T3, N2, M0) non-small cell lung cancer, squamous cell carcinoma followed by Dr. Arbutus Ped.  I have viewed most recent oncology note and lab work.    #Stage IIIB (T3, N2, M0) non-small cell lung cancer, squamous cell carcinoma  - Finished 7 treatments of carboplatin and paclitaxel on 10/06/22 - Next appointment with oncologist is 12/10/22   #Cough -Chart review shows patient reported cough with blood tinged mucus after overexerting herself at [employer]. She was prescribed z-pak for this on 11/07/22. She had recent CT Chest on 05/28 that showed radiation pneumonitis. Steroids were not prescribed because she is starting immunotherapy. -On exam patient is well-appearing.  She has an expiratory wheeze that clears with a cough.  No rales or rhonchi. -Per HPI blood-tinged mucus is minimal and no episodes of significant blood loss. -CBC today shows hemoglobin 11.8 just consistent with recent labs.  No indication for further workup at this time. -Will send refill for Hycodan cough syrup as that has helped her best in the past. I have reviewed the PDMP during this encounter.  Strict ED precautions discussed should symptoms worsen.   Heme/Onc History: Oncology History  Malignant neoplasm of unspecified part of unspecified bronchus or lung (HCC)  08/11/2022 Initial Diagnosis   Malignant neoplasm of  unspecified part of unspecified bronchus or lung (HCC)   08/11/2022 Cancer Staging   Staging form: Lung, AJCC 8th Edition - Clinical: Stage IIIB (cT3, cN2, cM0) - Signed by Si Gaul, MD on 08/11/2022   08/18/2022 - 10/06/2022 Chemotherapy   Patient is on Treatment Plan : LUNG Carboplatin + Paclitaxel + XRT q7d     11/13/2022 -  Chemotherapy   Patient is on Treatment Plan : LUNG NSCLC Durvalumab (1500) q28d         Interval history-: Evelyn Jordan is a 72 y.o. female with oncologic history as above presenting to [facility] today with chief complaint of cough.  Patient presents unaccompanied.  Patient is reporting with cough that has an ongoing times approximately 1 week.  She finished the Z-Pak that was prescribed to her by oncologist x 2 days ago.  She noticed that last night she did not cough.  She reports that her cough is sometimes clear sometimes pink-tinged mucus or sometimes pink streaks.  She has not had any fevers or chills during duration of symptoms.  She has pain when swallowing from radiation, otherwise denies any chest pain.  Patient has history of wheezing since being diagnosed and has a nebulizer and inhaler at home to use if needed.  Patient is also asking for refill for her cough medicine to take only when needed.      ROS  All other systems are reviewed and are negative for acute change except as noted in the HPI.    Allergies  Allergen Reactions   Compazine [Prochlorperazine Edisylate]     Neck hyperextends  Past Medical History:  Diagnosis Date   Depression    Insomnia    Lung cancer (HCC) 07/25/2022   Osteoporosis    Pulmonary nodule    Thyroid nodule      Past Surgical History:  Procedure Laterality Date   ANKLE SURGERY Right    BREAST BIOPSY Right    benign 2013   BRONCHIAL BIOPSY  08/04/2022   Procedure: BRONCHIAL BIOPSIES;  Surgeon: Leslye Peer, MD;  Location: [facility] ENDOSCOPY;  Service: Cardiopulmonary;;   BRONCHIAL BRUSHINGS  08/04/2022    Procedure: BRONCHIAL BRUSHINGS;  Surgeon: Leslye Peer, MD;  Location: [facility] ENDOSCOPY;  Service: Cardiopulmonary;;   HEMOSTASIS CONTROL  08/04/2022   Procedure: HEMOSTASIS CONTROL;  Surgeon: Leslye Peer, MD;  Location: [facility] ENDOSCOPY;  Service: Cardiopulmonary;;   IR IMAGING GUIDED PORT INSERTION  09/04/2022   repair of a torn meniscus Left 05/2018   VIDEO BRONCHOSCOPY Left 08/04/2022   Procedure: VIDEO BRONCHOSCOPY WITH FLUORO;  Surgeon: Leslye Peer, MD;  Location: [facility] ENDOSCOPY;  Service: Cardiopulmonary;  Laterality: Left;    Social History   Socioeconomic History   Marital status: Married    Spouse name: Archie   Number of children: Not on file   Years of education: Not on file   Highest education level: Not on file  Occupational History    Comment: retired Engineer, civil (consulting)   Tobacco Use   Smoking status: Former    Packs/day: 1.00    Years: 40.00    Additional pack years: 0.00    Total pack years: 40.00    Types: Cigarettes    Start date: 4    Quit date: 2012    Years since quitting: 12.4   Smokeless tobacco: Never  Vaping Use   Vaping Use: Never used  Substance and Sexual Activity   Alcohol use: Yes    Comment: Rarely   Drug use: No   Sexual activity: Not on file  Other Topics Concern   Not on file  Social History Narrative   Not on file   Social Determinants of Health   Financial Resource Strain: Low Risk  (10/21/2022)   Overall Financial Resource Strain (CARDIA)    Difficulty of Paying Living Expenses: Not hard at all  Food Insecurity: No Food Insecurity (10/21/2022)   Hunger Vital Sign    Worried About Running Out of Food in the Last Year: Never true    Ran Out of Food in the Last Year: Never true  Transportation Needs: No Transportation Needs (10/21/2022)   PRAPARE - Administrator, Civil Service (Medical): No    Lack of Transportation (Non-Medical): No  Physical Activity: Insufficiently Active (10/21/2022)   Exercise Vital Sign    Days of Exercise  per Week: 7 days    Minutes of Exercise per Session: 20 min  Stress: No Stress Concern Present (10/21/2022)   [employer] of Occupational Health - Occupational Stress Questionnaire    Feeling of Stress : Not at all  Social Connections: Moderately Isolated (10/21/2022)   Social Connection and Isolation Panel [NHANES]    Frequency of Communication with Friends and Family: More than three times a week    Frequency of Social Gatherings with Friends and Family: More than three times a week    Attends Religious Services: Never    Database administrator or Organizations: No    Attends Banker Meetings: Never    Marital Status: Married  Catering manager Violence: Not At Risk (10/21/2022)  Humiliation, Afraid, Rape, and Kick questionnaire    Fear of Current or Ex-Partner: No    Emotionally Abused: No    Physically Abused: No    Sexually Abused: No    Family History  Problem Relation Age of Onset   Alzheimer's disease Mother    Hypertension Father    Stroke Father    Breast cancer Neg Hx      Current Outpatient Medications:    HYDROcodone bit-homatropine (HYCODAN) 5-1.5 MG/5ML syrup, Take 5 mLs by mouth every 6 (six) hours as needed for cough., Disp: 120 mL, Rfl: 0   albuterol (PROVENTIL) (2.5 MG/3ML) 0.083% nebulizer solution, Take 3 mLs (2.5 mg total) by nebulization every 6 (six) hours as needed for wheezing or shortness of breath., Disp: 75 mL, Rfl: 12   albuterol (VENTOLIN HFA) 108 (90 Base) MCG/ACT inhaler, Inhale 2 puffs into the lungs every 6 (six) hours as needed for wheezing or shortness of breath., Disp: 8 g, Rfl: 6   ALPRAZolam (XANAX) 0.5 MG tablet, Take 1 tablet (0.5 mg total) by mouth at bedtime as needed for anxiety or sleep., Disp: 30 tablet, Rfl: 5   azithromycin (ZITHROMAX Z-PAK) 250 MG tablet, Use as directed, Disp: 6 each, Rfl: 0   BESIVANCE 0.6 % SUSP, Apply 1 drop to eye 3 (three) times daily., Disp: , Rfl:    Difluprednate 0.05 % EMUL, Apply to  eye., Disp: , Rfl:    docusate sodium (COLACE) 100 MG capsule, Take 100 mg by mouth every other day., Disp: , Rfl:    lidocaine-prilocaine (EMLA) cream, Apply 1 Application topically as needed., Disp: 30 g, Rfl: 2   PROLENSA 0.07 % SOLN, Apply 1 drop to eye 2 (two) times daily., Disp: , Rfl:  No current facility-administered medications for this visit.  Facility-Administered Medications Ordered in Other Visits:    durvalumab (IMFINZI) 1,500 mg in sodium chloride 0.9 % 100 mL chemo infusion, 1,500 mg, Intravenous, Once, Si Gaul, MD, Last Rate: 130 mL/hr at 11/13/22 1544, 1,500 mg at 11/13/22 1544   heparin lock flush 100 unit/mL, 500 Units, Intracatheter, Once PRN, Si Gaul, MD   sodium chloride flush (NS) 0.9 % injection 10 mL, 10 mL, Intracatheter, PRN, Si Gaul, MD  PHYSICAL EXAM: ECOG FS:1 - Symptomatic but completely ambulatory    Vitals:   11/13/22 1607  BP: 110/63  Pulse: 79  Resp: 18  Temp: 98.7 F (37.1 C)  TempSrc: Oral  SpO2: 99%  Weight: 120 lb (54.4 kg)   Physical Exam Vitals and nursing note reviewed.  Constitutional:      Appearance: She is not ill-appearing or toxic-appearing.  HENT:     Head: Normocephalic.     Mouth/Throat:     Mouth: Mucous membranes are moist.     Pharynx: Oropharynx is clear.  Eyes:     Conjunctiva/sclera: Conjunctivae normal.  Cardiovascular:     Rate and Rhythm: Normal rate and regular rhythm.     Pulses: Normal pulses.     Heart sounds: Normal heart sounds.  Pulmonary:     Effort: Pulmonary effort is normal. No respiratory distress.     Breath sounds: No stridor. No rhonchi or rales.     Comments: Faint expiratory wheezing that clears after coughing Abdominal:     General: There is no distension.  Musculoskeletal:     Cervical back: Normal range of motion.  Skin:    General: Skin is warm and dry.  Neurological:     Mental Status: She is  alert.        LABORATORY DATA: I have reviewed the data as  listed    Latest Ref Rng & Units 11/13/2022    2:03 PM 11/04/2022   10:25 AM 10/06/2022    8:53 AM  CBC  WBC 4.0 - 10.5 K/uL 5.8  4.0  1.9   Hemoglobin 12.0 - 15.0 g/dL 16.1  09.6  04.5   Hematocrit 36.0 - 46.0 % 35.0  34.1  31.8   Platelets 150 - 400 K/uL 188  158  145         Latest Ref Rng & Units 11/13/2022    2:03 PM 11/04/2022   10:25 AM 10/06/2022    8:53 AM  CMP  Glucose 70 - 99 mg/dL 409  96  811   BUN 8 - 23 mg/dL 11  12  19    Creatinine 0.44 - 1.00 mg/dL 9.14  7.82  9.56   Sodium 135 - 145 mmol/L 140  140  141   Potassium 3.5 - 5.1 mmol/L 4.1  4.0  4.1   Chloride 98 - 111 mmol/L 107  109  107   CO2 22 - 32 mmol/L 29  26  27    Calcium 8.9 - 10.3 mg/dL 9.1  8.9  9.5   Total Protein 6.5 - 8.1 g/dL 6.5  6.3  6.0   Total Bilirubin 0.3 - 1.2 mg/dL 0.3  0.3  0.4   Alkaline Phos 38 - 126 U/L 66  57  53   AST 15 - 41 U/L 20  30  22    ALT 0 - 44 U/L 22  32  32        RADIOGRAPHIC STUDIES (from last 24 hours if applicable) I have personally reviewed the radiological images as listed and agreed with the findings in the report. No results found.      Visit Diagnosis: 1. Malignant neoplasm of unspecified part of unspecified bronchus or lung (HCC)   2. Cough, unspecified type      No orders of the defined types were placed in this encounter.   All questions were answered. The patient knows to call the clinic with any problems, questions or concerns. No barriers to learning was detected.  A total of more than 30 minutes were spent on this encounter with face-to-face time and non-face-to-face time, including preparing to see the patient, ordering medications, counseling the patient and coordination of care as outlined above.    Thank you for allowing me to participate in the care of this patient.    Shanon Ace, PA-C Department of Hematology/Oncology [facility] at [facility] Phone: [phone]  [phone]     11/13/2022 4:21 PM

## 2022-11-13 ENCOUNTER — Telehealth: Payer: Self-pay | Admitting: Medical Oncology

## 2022-11-13 ENCOUNTER — Inpatient Hospital Stay: Payer: Medicare Other

## 2022-11-13 ENCOUNTER — Other Ambulatory Visit: Payer: Self-pay

## 2022-11-13 ENCOUNTER — Inpatient Hospital Stay (HOSPITAL_BASED_OUTPATIENT_CLINIC_OR_DEPARTMENT_OTHER): Payer: Medicare Other | Admitting: Physician Assistant

## 2022-11-13 ENCOUNTER — Encounter: Payer: Self-pay | Admitting: Internal Medicine

## 2022-11-13 ENCOUNTER — Inpatient Hospital Stay: Payer: Medicare Other | Attending: Internal Medicine

## 2022-11-13 ENCOUNTER — Other Ambulatory Visit: Payer: Self-pay | Admitting: Medical Oncology

## 2022-11-13 VITALS — BP 154/81 | HR 75 | Temp 98.7°F | Resp 18 | Ht 63.0 in | Wt 120.0 lb

## 2022-11-13 VITALS — BP 110/63 | HR 79 | Temp 98.7°F | Resp 18 | Wt 120.0 lb

## 2022-11-13 DIAGNOSIS — Z87891 Personal history of nicotine dependence: Secondary | ICD-10-CM | POA: Insufficient documentation

## 2022-11-13 DIAGNOSIS — C3490 Malignant neoplasm of unspecified part of unspecified bronchus or lung: Secondary | ICD-10-CM | POA: Diagnosis not present

## 2022-11-13 DIAGNOSIS — Z5112 Encounter for antineoplastic immunotherapy: Secondary | ICD-10-CM | POA: Insufficient documentation

## 2022-11-13 DIAGNOSIS — R059 Cough, unspecified: Secondary | ICD-10-CM

## 2022-11-13 DIAGNOSIS — Z79899 Other long term (current) drug therapy: Secondary | ICD-10-CM | POA: Insufficient documentation

## 2022-11-13 DIAGNOSIS — C3412 Malignant neoplasm of upper lobe, left bronchus or lung: Secondary | ICD-10-CM | POA: Insufficient documentation

## 2022-11-13 DIAGNOSIS — Z95828 Presence of other vascular implants and grafts: Secondary | ICD-10-CM

## 2022-11-13 LAB — CBC WITH DIFFERENTIAL (CANCER CENTER ONLY)
Abs Immature Granulocytes: 0.02 10*3/uL (ref 0.00–0.07)
Basophils Absolute: 0 10*3/uL (ref 0.0–0.1)
Basophils Relative: 1 %
Eosinophils Absolute: 0.1 10*3/uL (ref 0.0–0.5)
Eosinophils Relative: 2 %
HCT: 35 % — ABNORMAL LOW (ref 36.0–46.0)
Hemoglobin: 11.8 g/dL — ABNORMAL LOW (ref 12.0–15.0)
Immature Granulocytes: 0 %
Lymphocytes Relative: 7 %
Lymphs Abs: 0.4 10*3/uL — ABNORMAL LOW (ref 0.7–4.0)
MCH: 32.2 pg (ref 26.0–34.0)
MCHC: 33.7 g/dL (ref 30.0–36.0)
MCV: 95.6 fL (ref 80.0–100.0)
Monocytes Absolute: 0.6 10*3/uL (ref 0.1–1.0)
Monocytes Relative: 10 %
Neutro Abs: 4.6 10*3/uL (ref 1.7–7.7)
Neutrophils Relative %: 80 %
Platelet Count: 188 10*3/uL (ref 150–400)
RBC: 3.66 MIL/uL — ABNORMAL LOW (ref 3.87–5.11)
RDW: 18.3 % — ABNORMAL HIGH (ref 11.5–15.5)
WBC Count: 5.8 10*3/uL (ref 4.0–10.5)
nRBC: 0 % (ref 0.0–0.2)

## 2022-11-13 LAB — CMP (CANCER CENTER ONLY)
ALT: 22 U/L (ref 0–44)
AST: 20 U/L (ref 15–41)
Albumin: 3.8 g/dL (ref 3.5–5.0)
Alkaline Phosphatase: 66 U/L (ref 38–126)
Anion gap: 4 — ABNORMAL LOW (ref 5–15)
BUN: 11 mg/dL (ref 8–23)
CO2: 29 mmol/L (ref 22–32)
Calcium: 9.1 mg/dL (ref 8.9–10.3)
Chloride: 107 mmol/L (ref 98–111)
Creatinine: 0.79 mg/dL (ref 0.44–1.00)
GFR, Estimated: >60 mL/min (ref >60)
Glucose, Bld: 152 mg/dL — ABNORMAL HIGH (ref 70–99)
Potassium: 4.1 mmol/L (ref 3.5–5.1)
Sodium: 140 mmol/L (ref 135–145)
Total Bilirubin: 0.3 mg/dL (ref 0.3–1.2)
Total Protein: 6.5 g/dL (ref 6.5–8.1)

## 2022-11-13 LAB — TSH: TSH: 1.336 u[IU]/mL (ref 0.350–4.500)

## 2022-11-13 MED ORDER — FAMOTIDINE IN NACL 20-0.9 MG/50ML-% IV SOLN
20.0000 mg | Freq: Once | INTRAVENOUS | Status: AC | PRN
  Administered 2022-11-13: 20 mg via INTRAVENOUS

## 2022-11-13 MED ORDER — SODIUM CHLORIDE 0.9% FLUSH
10.0000 mL | INTRAVENOUS | Status: DC | PRN
  Administered 2022-11-13: 10 mL

## 2022-11-13 MED ORDER — HYDROCODONE BIT-HOMATROP MBR 5-1.5 MG/5ML PO SOLN: 5.0000 mL | Freq: Four times a day (QID) | ORAL | 0 refills | Status: DC | PRN

## 2022-11-13 MED ORDER — SODIUM CHLORIDE 0.9% FLUSH
10.0000 mL | Freq: Once | INTRAVENOUS | Status: AC
  Administered 2022-11-13: 10 mL

## 2022-11-13 MED ORDER — SODIUM CHLORIDE 0.9 % IV SOLN: Freq: Once | INTRAVENOUS | Status: DC | PRN

## 2022-11-13 MED ORDER — SODIUM CHLORIDE 0.9 % IV SOLN: Freq: Once | INTRAVENOUS | Status: AC

## 2022-11-13 MED ORDER — SODIUM CHLORIDE 0.9 % IV SOLN
1500.0000 mg | Freq: Once | INTRAVENOUS | Status: AC
  Administered 2022-11-13: 1500 mg via INTRAVENOUS
  Filled 2022-11-13: qty 30

## 2022-11-13 MED ORDER — HEPARIN SOD (PORK) LOCK FLUSH 100 UNIT/ML IV SOLN
500.0000 [IU] | Freq: Once | INTRAVENOUS | Status: AC | PRN
  Administered 2022-11-13: 500 [IU]

## 2022-11-13 NOTE — Patient Instructions (Addendum)
[place] [facility]  Discharge Instructions: Thank you for choosing [facility] to provide your oncology and hematology care.   If you have a lab appointment with the Cancer Center, please go directly to the Cancer Center and check in at the registration area.   Wear comfortable clothing and clothing appropriate for easy access to any Portacath or PICC line.   We strive to give you quality time with your provider. You may need to reschedule your appointment if you arrive late (15 or more minutes).  Arriving late affects you and other patients whose appointments are after yours.  Also, if you miss three or more appointments without notifying the office, you may be dismissed from the clinic at the provider's discretion.      For prescription refill requests, have your pharmacy contact our office and allow 72 hours for refills to be completed.    Today you received the following immunotherapy agents Durvalumab     To help prevent nausea and vomiting after your treatment, we encourage you to take your nausea medication as directed.  BELOW ARE SYMPTOMS THAT SHOULD BE REPORTED IMMEDIATELY: *FEVER GREATER THAN 100.4 F (38 C) OR HIGHER *CHILLS OR SWEATING *NAUSEA AND VOMITING THAT IS NOT CONTROLLED WITH YOUR NAUSEA MEDICATION *UNUSUAL SHORTNESS OF BREATH *UNUSUAL BRUISING OR BLEEDING *URINARY PROBLEMS (pain or burning when urinating, or frequent urination) *BOWEL PROBLEMS (unusual diarrhea, constipation, pain near the anus) TENDERNESS IN MOUTH AND THROAT WITH OR WITHOUT PRESENCE OF ULCERS (sore throat, sores in mouth, or a toothache) UNUSUAL RASH, SWELLING OR PAIN  UNUSUAL VAGINAL DISCHARGE OR ITCHING   Items with * indicate a potential emergency and should be followed up as soon as possible or go to the Emergency Department if any problems should occur.  Please show the CHEMOTHERAPY ALERT CARD or IMMUNOTHERAPY ALERT CARD at check-in to the Emergency  Department and triage nurse.  Should you have questions after your visit or need to cancel or reschedule your appointment, please contact [place] [facility]  Dept: [phone]  and follow the prompts.  Office hours are 8:00 a.m. to 4:30 p.m. Monday - Friday. Please note that voicemails left after 4:00 p.m. may not be returned until the following business day.  We are closed weekends and major holidays. You have access to a nurse at all times for urgent questions. Please call the main number to the clinic Dept: [phone] and follow the prompts.   For any non-urgent questions, you may also contact your provider using MyChart. We now offer e-Visits for anyone 8 and older to request care online for non-urgent symptoms. For details visit [email or web address].   Also download the MyChart app! Go to the app store, search "MyChart", open the app, select , and log in with your MyChart username and password.  Durvalumab Injection What is this medication? DURVALUMAB (dur VAL ue mab) treats some types of cancer. It works by helping your immune system slow or stop the spread of cancer cells. It is a monoclonal antibody. This medicine may be used for other purposes; ask your health care provider or pharmacist if you have questions. COMMON BRAND NAME(S): IMFINZI What should I tell my care team before I take this medication? They need to know if you have any of these conditions: Allogeneic stem cell transplant (uses someone else's stem cells) Autoimmune diseases, such as Crohn disease, ulcerative colitis, lupus History of chest radiation Nervous system problems, such as  Guillain-Barre syndrome, myasthenia gravis Organ transplant An unusual or allergic reaction to durvalumab, other medications, foods, dyes, or preservatives Pregnant or trying to get pregnant Breast-feeding How should I use this medication? This medication is infused into a vein. It is given by  your care team in a hospital or clinic setting. A special MedGuide will be given to you before each treatment. Be sure to read this information carefully each time. Talk to your care team about the use of this medication in children. Special care may be needed. Overdosage: If you think you have taken too much of this medicine contact a poison control center or emergency room at once. NOTE: This medicine is only for you. Do not share this medicine with others. What if I miss a dose? Keep appointments for follow-up doses. It is important not to miss your dose. Call your care team if you are unable to keep an appointment. What may interact with this medication? Interactions have not been studied. This list may not describe all possible interactions. Give your health care provider a list of all the medicines, herbs, non-prescription drugs, or dietary supplements you use. Also tell them if you smoke, drink alcohol, or use illegal drugs. Some items may interact with your medicine. What should I watch for while using this medication? Your condition will be monitored carefully while you are receiving this medication. You may need blood work while taking this medication. This medication may cause serious skin reactions. They can happen weeks to months after starting the medication. Contact your care team right away if you notice fevers or flu-like symptoms with a rash. The rash may be red or purple and then turn into blisters or peeling of the skin. You may also notice a red rash with swelling of the face, lips, or lymph nodes in your neck or under your arms. Tell your care team right away if you have any change in your eyesight. Talk to your care team if you may be pregnant. Serious birth defects can occur if you take this medication during pregnancy and for 3 months after the last dose. You will need a negative pregnancy test before starting this medication. Contraception is recommended while taking this  medication and for 3 months after the last dose. Your care team can help you find the option that works for you. Do not breastfeed while taking this medication and for 3 months after the last dose. What side effects may I notice from receiving this medication? Side effects that you should report to your care team as soon as possible: Allergic reactions--skin rash, itching, hives, swelling of the face, lips, tongue, or throat Dry cough, shortness of breath or trouble breathing Eye pain, redness, irritation, or discharge with blurry or decreased vision Heart muscle inflammation--unusual weakness or fatigue, shortness of breath, chest pain, fast or irregular heartbeat, dizziness, swelling of the ankles, feet, or hands Hormone gland problems--headache, sensitivity to light, unusual weakness or fatigue, dizziness, fast or irregular heartbeat, increased sensitivity to cold or heat, excessive sweating, constipation, hair loss, increased thirst or amount of urine, tremors or shaking, irritability Infusion reactions--chest pain, shortness of breath or trouble breathing, feeling faint or lightheaded Kidney injury (glomerulonephritis)--decrease in the amount of urine, red or dark brown urine, foamy or bubbly urine, swelling of the ankles, hands, or feet Liver injury--right upper belly pain, loss of appetite, nausea, light-colored stool, dark yellow or brown urine, yellowing skin or eyes, unusual weakness or fatigue Pain, tingling, or numbness in the hands  or feet, muscle weakness, change in vision, confusion or trouble speaking, loss of balance or coordination, trouble walking, seizures Rash, fever, and swollen lymph nodes Redness, blistering, peeling, or loosening of the skin, including inside the mouth Sudden or severe stomach pain, bloody diarrhea, fever, nausea, vomiting Side effects that usually do not require medical attention (report these to your care team if they continue or are bothersome): Bone,  joint, or muscle pain Diarrhea Fatigue Loss of appetite Nausea Skin rash This list may not describe all possible side effects. Call your doctor for medical advice about side effects. You may report side effects to FDA at 1-800-FDA-1088. Where should I keep my medication? This medication is given in a hospital or clinic. It will not be stored at home. NOTE: This sheet is a summary. It may not cover all possible information. If you have questions about this medicine, talk to your doctor, pharmacist, or health care provider.  2024 Elsevier/Gold Standard (2021-10-08 00:00:00)

## 2022-11-13 NOTE — Progress Notes (Signed)
1428: Pt reports chest tightness. Describes the pain as "potato chips stuck in her esophagus" Infusion paused and normal saline hung to gravity. Dr. Arbutus Ped notified. EKG obtain. Dr. Arbutus Ped reviewed EKG - Normal Sinus Rhythm. Pepcid given per MAR.   17:00 Pt denies any pain/chest tightness. Imfinzi restarted 17:15 Pt reports similar pain. VSS.  Pt  reports pain to be at the exact spot where she had radiation.  1720: Pt denies any pain with completion of imfizi infusion

## 2022-11-13 NOTE — Telephone Encounter (Signed)
-----   Message from Kellogg, PA-C sent at 11/11/2022  7:29 PM EDT ----- Can you respond to her and tell her that I changed the date of 8/8 and scheduling should update her schedule in the next few days.  ----- Message ----- From: Charma Igo, RN Sent: 11/11/2022   5:16 PM EDT To: Johnette Abraham Heilingoetter, PA-C   From pt-  "I will be traveling July 27 to August 7 and again August 9 to August 13. Thank you  I will not be in town on August 1 yet my third appointment for infusion is scheduled for August 1. Please clarify."

## 2022-11-13 NOTE — Telephone Encounter (Signed)
Pt.notified

## 2022-11-14 ENCOUNTER — Encounter: Payer: Self-pay | Admitting: Physician Assistant

## 2022-11-15 LAB — T4: T4, Total: 7.6 ug/dL (ref 4.5–12.0)

## 2022-11-17 ENCOUNTER — Encounter: Payer: Self-pay | Admitting: Internal Medicine

## 2022-11-17 NOTE — Telephone Encounter (Signed)
Called pt to see how she did with her recent immunotherapy.  Per note, pt had chest pain where radiation was done while getting medication. She was seen by PA Kaitlyn.  Benadryl was suggested but pt was driving.  She was given Pepcid.  She reports getting through treatment but wonders if she will have same reaction next time & should she have some benadryl & have a driver.  Suggested she might & should have a driver just in case.  Informed that message would be sent to MD to see if he wants to add anything.

## 2022-11-17 NOTE — Telephone Encounter (Signed)
-----   Message from Curtis Sites, RN sent at 11/13/2022  5:37 PM EDT ----- Regarding: Dr. Arbutus Ped 1st tx f/u call Dr. Arbutus Ped 1st tx f/u call . Imfinzi - had some chest pain. Like "potato chips in esophagus" EKG normal. Pep given. Felt better then pain returned. Was in the spot where she had radiation.

## 2022-11-20 ENCOUNTER — Encounter: Payer: Self-pay | Admitting: Emergency Medicine

## 2022-11-25 ENCOUNTER — Encounter: Payer: Self-pay | Admitting: Internal Medicine

## 2022-11-25 NOTE — Telephone Encounter (Signed)
Dr. Delton Coombes please respond to the following My Chart message:   Evelyn [facility] (supporting Leslye Peer, MD)5 days ago    Would an [place] help my lungs ? If so could I get one. My oncology care buddy recommended it. Thank you  Thank you

## 2022-11-28 ENCOUNTER — Encounter: Payer: Self-pay | Admitting: Internal Medicine

## 2022-11-28 DIAGNOSIS — R059 Cough, unspecified: Secondary | ICD-10-CM | POA: Diagnosis not present

## 2022-11-28 DIAGNOSIS — R0602 Shortness of breath: Secondary | ICD-10-CM | POA: Diagnosis not present

## 2022-12-02 ENCOUNTER — Encounter: Payer: Self-pay | Admitting: Family Medicine

## 2022-12-09 DIAGNOSIS — H2512 Age-related nuclear cataract, left eye: Secondary | ICD-10-CM | POA: Diagnosis not present

## 2022-12-09 DIAGNOSIS — H2511 Age-related nuclear cataract, right eye: Secondary | ICD-10-CM | POA: Diagnosis not present

## 2022-12-09 DIAGNOSIS — H269 Unspecified cataract: Secondary | ICD-10-CM | POA: Diagnosis not present

## 2022-12-09 DIAGNOSIS — H25012 Cortical age-related cataract, left eye: Secondary | ICD-10-CM | POA: Diagnosis not present

## 2022-12-09 DIAGNOSIS — H25042 Posterior subcapsular polar age-related cataract, left eye: Secondary | ICD-10-CM | POA: Diagnosis not present

## 2022-12-10 ENCOUNTER — Encounter: Payer: Self-pay | Admitting: Internal Medicine

## 2022-12-10 ENCOUNTER — Other Ambulatory Visit: Payer: Medicare Other

## 2022-12-10 ENCOUNTER — Inpatient Hospital Stay (HOSPITAL_BASED_OUTPATIENT_CLINIC_OR_DEPARTMENT_OTHER): Payer: Medicare Other | Admitting: Internal Medicine

## 2022-12-10 ENCOUNTER — Inpatient Hospital Stay: Payer: Medicare Other

## 2022-12-10 ENCOUNTER — Inpatient Hospital Stay: Payer: Medicare Other | Attending: Internal Medicine

## 2022-12-10 ENCOUNTER — Other Ambulatory Visit: Payer: Self-pay

## 2022-12-10 VITALS — BP 153/87 | HR 78 | Resp 18

## 2022-12-10 DIAGNOSIS — Z5112 Encounter for antineoplastic immunotherapy: Secondary | ICD-10-CM | POA: Insufficient documentation

## 2022-12-10 DIAGNOSIS — Z95828 Presence of other vascular implants and grafts: Secondary | ICD-10-CM

## 2022-12-10 DIAGNOSIS — C3412 Malignant neoplasm of upper lobe, left bronchus or lung: Secondary | ICD-10-CM | POA: Insufficient documentation

## 2022-12-10 DIAGNOSIS — K209 Esophagitis, unspecified without bleeding: Secondary | ICD-10-CM | POA: Insufficient documentation

## 2022-12-10 DIAGNOSIS — Z79899 Other long term (current) drug therapy: Secondary | ICD-10-CM | POA: Diagnosis not present

## 2022-12-10 DIAGNOSIS — Z923 Personal history of irradiation: Secondary | ICD-10-CM | POA: Diagnosis not present

## 2022-12-10 DIAGNOSIS — C3490 Malignant neoplasm of unspecified part of unspecified bronchus or lung: Secondary | ICD-10-CM

## 2022-12-10 LAB — CMP (CANCER CENTER ONLY)
ALT: 11 U/L (ref 0–44)
AST: 16 U/L (ref 15–41)
Albumin: 3.5 g/dL (ref 3.5–5.0)
Alkaline Phosphatase: 63 U/L (ref 38–126)
Anion gap: 6 (ref 5–15)
BUN: 19 mg/dL (ref 8–23)
CO2: 21 mmol/L — ABNORMAL LOW (ref 22–32)
Calcium: 9.2 mg/dL (ref 8.9–10.3)
Chloride: 113 mmol/L — ABNORMAL HIGH (ref 98–111)
Creatinine: 0.76 mg/dL (ref 0.44–1.00)
GFR, Estimated: >60 mL/min (ref >60)
Glucose, Bld: 90 mg/dL (ref 70–99)
Potassium: 3.9 mmol/L (ref 3.5–5.1)
Sodium: 140 mmol/L (ref 135–145)
Total Bilirubin: 0.3 mg/dL (ref 0.3–1.2)
Total Protein: 6.2 g/dL — ABNORMAL LOW (ref 6.5–8.1)

## 2022-12-10 LAB — CBC WITH DIFFERENTIAL (CANCER CENTER ONLY)
Abs Immature Granulocytes: 0.02 10*3/uL (ref 0.00–0.07)
Basophils Absolute: 0 10*3/uL (ref 0.0–0.1)
Basophils Relative: 1 %
Eosinophils Absolute: 0.1 10*3/uL (ref 0.0–0.5)
Eosinophils Relative: 1 %
HCT: 38.3 % (ref 36.0–46.0)
Hemoglobin: 12.6 g/dL (ref 12.0–15.0)
Immature Granulocytes: 0 %
Lymphocytes Relative: 7 %
Lymphs Abs: 0.4 10*3/uL — ABNORMAL LOW (ref 0.7–4.0)
MCH: 32.7 pg (ref 26.0–34.0)
MCHC: 32.9 g/dL (ref 30.0–36.0)
MCV: 99.5 fL (ref 80.0–100.0)
Monocytes Absolute: 0.6 10*3/uL (ref 0.1–1.0)
Monocytes Relative: 10 %
Neutro Abs: 4.6 10*3/uL (ref 1.7–7.7)
Neutrophils Relative %: 81 %
Platelet Count: 191 10*3/uL (ref 150–400)
RBC: 3.85 MIL/uL — ABNORMAL LOW (ref 3.87–5.11)
RDW: 14.4 % (ref 11.5–15.5)
WBC Count: 5.7 10*3/uL (ref 4.0–10.5)
nRBC: 0 % (ref 0.0–0.2)

## 2022-12-10 MED ORDER — SODIUM CHLORIDE 0.9 % IV SOLN: Freq: Once | INTRAVENOUS | Status: AC

## 2022-12-10 MED ORDER — HEPARIN SOD (PORK) LOCK FLUSH 100 UNIT/ML IV SOLN
500.0000 [IU] | Freq: Once | INTRAVENOUS | Status: AC | PRN
  Administered 2022-12-10: 500 [IU]

## 2022-12-10 MED ORDER — DIPHENHYDRAMINE HCL 50 MG/ML IJ SOLN
25.0000 mg | Freq: Once | INTRAMUSCULAR | Status: AC
  Administered 2022-12-10: 25 mg via INTRAVENOUS
  Filled 2022-12-10: qty 1

## 2022-12-10 MED ORDER — SODIUM CHLORIDE 0.9 % IV SOLN
1500.0000 mg | Freq: Once | INTRAVENOUS | Status: AC
  Administered 2022-12-10: 1500 mg via INTRAVENOUS
  Filled 2022-12-10: qty 30

## 2022-12-10 MED ORDER — SODIUM CHLORIDE 0.9% FLUSH
10.0000 mL | Freq: Once | INTRAVENOUS | Status: AC
  Administered 2022-12-10: 10 mL

## 2022-12-10 MED ORDER — SODIUM CHLORIDE 0.9% FLUSH
10.0000 mL | INTRAVENOUS | Status: DC | PRN
  Administered 2022-12-10: 10 mL

## 2022-12-10 NOTE — Progress Notes (Signed)
[facility] [phone]   Fax:([phone]) [phone]  OFFICE PROGRESS NOTE  Evelyn Ora, MD [address] [place] [address]  DIAGNOSIS: Stage IIIB (T3, N2, M0) non-small cell lung cancer, squamous cell carcinoma presented with obstructive left lingular mass with consolidation and collapse of the lingula as well as part of the left upper lobe and likely invasion of the mediastinum diagnosed in February 2024.   Biomarker Findings Microsatellite status - MS-Stable Tumor Mutational Burden - 5 Muts/Mb Genomic Findings For a complete list of the genes assayed, please refer to the Appendix. CCND1 amplification FGFR1 amplification MDM2 amplification FGF19 amplification FGF3 amplification FGF4 amplification TP53 Q136* 8 Disease relevant genes with no reportable alterations: ALK, BRAF, EGFR, ERBB2, KRAS, MET, RET, ROS1  PDL1: 5%   PRIOR THERAPY: Concurrent chemoradiation with weekly carboplatin for AUC of 2 and paclitaxel 45 Mg/M2. Status post 7 weeks of treatment.  Last dose was given October 06, 2022 with partial response.   CURRENT THERAPY: Consolidation treatment with immunotherapy with Imfinzi 1500 Mg IV every 4 weeks.  First dose on November 13, 2022.   INTERVAL HISTORY: Evelyn Jordan 72 y.o. female return to the clinic today for follow-up visit.  The patient is feeling fine today with no concerning complaints except for the mild shortness of breath with exertion especially going uphill.  She denied having any current chest pain, or hemoptysis but has mild cough.  She has no nausea, vomiting, diarrhea or constipation.  She has no headache or visual changes.  She tolerated the first cycle of her consolidation treatment with Imfinzi fairly well.  She is here today for evaluation before starting cycle #2.   MEDICAL HISTORY: Past Medical History:  Diagnosis Date   Depression    Insomnia    Lung cancer (HCC) 07/25/2022   Osteoporosis    Pulmonary  nodule    Thyroid nodule     ALLERGIES:  is allergic to compazine [prochlorperazine edisylate].  MEDICATIONS:  Current Outpatient Medications  Medication Sig Dispense Refill   albuterol (PROVENTIL) (2.5 MG/3ML) 0.083% nebulizer solution Take 3 mLs (2.5 mg total) by nebulization every 6 (six) hours as needed for wheezing or shortness of breath. 75 mL 12   albuterol (VENTOLIN HFA) 108 (90 Base) MCG/ACT inhaler Inhale 2 puffs into the lungs every 6 (six) hours as needed for wheezing or shortness of breath. 8 g 6   ALPRAZolam (XANAX) 0.5 MG tablet Take 1 tablet (0.5 mg total) by mouth at bedtime as needed for anxiety or sleep. 30 tablet 5   azithromycin (ZITHROMAX Z-PAK) 250 MG tablet Use as directed 6 each 0   BESIVANCE 0.6 % SUSP Apply 1 drop to eye 3 (three) times daily.     Difluprednate 0.05 % EMUL Apply to eye.     docusate sodium (COLACE) 100 MG capsule Take 100 mg by mouth every other day.     HYDROcodone bit-homatropine (HYCODAN) 5-1.5 MG/5ML syrup Take 5 mLs by mouth every 6 (six) hours as needed for cough. 120 mL 0   lidocaine-prilocaine (EMLA) cream Apply 1 Application topically as needed. 30 g 2   PROLENSA 0.07 % SOLN Apply 1 drop to eye 2 (two) times daily.     No current facility-administered medications for this visit.    SURGICAL HISTORY:  Past Surgical History:  Procedure Laterality Date   ANKLE SURGERY Right    BREAST BIOPSY Right    benign 2013   BRONCHIAL BIOPSY  08/04/2022   Procedure: BRONCHIAL BIOPSIES;  Surgeon: Leslye Peer, MD;  Location: [facility] ENDOSCOPY;  Service: Cardiopulmonary;;   BRONCHIAL BRUSHINGS  08/04/2022   Procedure: BRONCHIAL BRUSHINGS;  Surgeon: Leslye Peer, MD;  Location: [facility] ENDOSCOPY;  Service: Cardiopulmonary;;   HEMOSTASIS CONTROL  08/04/2022   Procedure: HEMOSTASIS CONTROL;  Surgeon: Leslye Peer, MD;  Location: [facility] ENDOSCOPY;  Service: Cardiopulmonary;;   IR IMAGING GUIDED PORT INSERTION  09/04/2022   repair of a torn meniscus Left  05/2018   VIDEO BRONCHOSCOPY Left 08/04/2022   Procedure: VIDEO BRONCHOSCOPY WITH FLUORO;  Surgeon: Leslye Peer, MD;  Location: [facility] ENDOSCOPY;  Service: Cardiopulmonary;  Laterality: Left;    REVIEW OF SYSTEMS:  A comprehensive review of systems was negative except for: Respiratory: positive for dyspnea on exertion   PHYSICAL EXAMINATION: General appearance: alert, cooperative, and no distress Head: Normocephalic, without obvious abnormality, atraumatic Neck: no adenopathy, no JVD, supple, symmetrical, trachea midline, and thyroid not enlarged, symmetric, no tenderness/mass/nodules Lymph nodes: Cervical, supraclavicular, and axillary nodes normal. Resp: clear to auscultation bilaterally Back: symmetric, no curvature. ROM normal. No CVA tenderness. Cardio: regular rate and rhythm, S1, S2 normal, no murmur, click, rub or gallop GI: soft, non-tender; bowel sounds normal; no masses,  no organomegaly Extremities: extremities normal, atraumatic, no cyanosis or edema  ECOG PERFORMANCE STATUS: 1 - Symptomatic but completely ambulatory  Blood pressure 118/81, pulse 93, temperature 98.1 F (36.7 C), temperature source Oral, resp. rate 18, height 5\' 3"  (1.6 m), weight 118 lb 4.8 oz (53.7 kg), SpO2 98 %.  LABORATORY DATA: Lab Results  Component Value Date   WBC 5.7 12/10/2022   HGB 12.6 12/10/2022   HCT 38.3 12/10/2022   MCV 99.5 12/10/2022   PLT 191 12/10/2022      Chemistry      Component Value Date/Time   NA 140 11/13/2022 1403   NA 141 03/07/2015 0000   K 4.1 11/13/2022 1403   CL 107 11/13/2022 1403   CO2 29 11/13/2022 1403   BUN 11 11/13/2022 1403   CREATININE 0.79 11/13/2022 1403   GLU 103 03/07/2015 0000      Component Value Date/Time   CALCIUM 9.1 11/13/2022 1403   ALKPHOS 66 11/13/2022 1403   AST 20 11/13/2022 1403   ALT 22 11/13/2022 1403   BILITOT 0.3 11/13/2022 1403       RADIOGRAPHIC STUDIES: No results found.  ASSESSMENT AND PLAN: This is a very pleasant  72 years old white female with Stage IIIB (T3, N2, M0) non-small cell lung cancer, squamous cell carcinoma presented with obstructive left lingular mass with consolidation and collapse of the lingula as well as part of the left upper lobe and likely invasion of the mediastinum diagnosed in February 2024.  Molecular studies by foundation 1 showed no actionable mutations PDL1: 5% The patient underwent a course of concurrent chemoradiation with weekly carboplatin for AUC of 2 and paclitaxel 45 Mg/M2 status post 7  cycles.  Last dose was given on October 06, 2022 with partial response. The patient tolerated the previous course of her treatment fairly well with no concerning adverse effect except for the fatigue and the radiation-induced esophagitis. She had partial response to this treatment. She is currently undergoing consolidation treatment with immunotherapy with Imfinzi 1500 Mg IV every 4 weeks status post 1 cycle.  She tolerated the first cycle of her treatment fairly well. I recommended for the patient to proceed with cycle #2 today as planned. She will come back for  follow-up visit in 4 weeks for evaluation before starting cycle #3. The patient was advised to call immediately if she has any other concerning symptoms in the interval. The patient voices understanding of current disease status and treatment options and is in agreement with the current care plan.  All questions were answered. The patient knows to call the clinic with any problems, questions or concerns. We can certainly see the patient much sooner if necessary.  The total time spent in the appointment was 20 minutes.  Disclaimer: This note was dictated with voice recognition software. Similar sounding words can inadvertently be transcribed and may not be corrected upon review.

## 2022-12-10 NOTE — Progress Notes (Signed)
Patient had some chest pressure/discomfort towards the end of today's treatment as with previous treatment. 25mg  IV benadryl had been given before treatment as a pre-medication. Patient had stable vitals and the discomfort started diminishing after discontinuing the infusion (infusion was completed per patient request due to closeness of end). Patient was given primary literature about radiation recall for reference. Patient urged to discuss treatment with MD/PA to address issue.

## 2022-12-10 NOTE — Patient Instructions (Signed)
[facility]  Discharge Instructions: Thank you for choosing [facility] to provide your oncology and hematology care.   If you have a lab appointment with the Cancer Center, please go directly to the Cancer Center and check in at the registration area.   Wear comfortable clothing and clothing appropriate for easy access to any Portacath or PICC line.   We strive to give you quality time with your provider. You may need to reschedule your appointment if you arrive late (15 or more minutes).  Arriving late affects you and other patients whose appointments are after yours.  Also, if you miss three or more appointments without notifying the office, you may be dismissed from the clinic at the provider's discretion.      For prescription refill requests, have your pharmacy contact our office and allow 72 hours for refills to be completed.    Today you received the following immunotherapy agents Durvalumab     To help prevent nausea and vomiting after your treatment, we encourage you to take your nausea medication as directed.  BELOW ARE SYMPTOMS THAT SHOULD BE REPORTED IMMEDIATELY: *FEVER GREATER THAN 100.4 F (38 C) OR HIGHER *CHILLS OR SWEATING *NAUSEA AND VOMITING THAT IS NOT CONTROLLED WITH YOUR NAUSEA MEDICATION *UNUSUAL SHORTNESS OF BREATH *UNUSUAL BRUISING OR BLEEDING *URINARY PROBLEMS (pain or burning when urinating, or frequent urination) *BOWEL PROBLEMS (unusual diarrhea, constipation, pain near the anus) TENDERNESS IN MOUTH AND THROAT WITH OR WITHOUT PRESENCE OF ULCERS (sore throat, sores in mouth, or a toothache) UNUSUAL RASH, SWELLING OR PAIN  UNUSUAL VAGINAL DISCHARGE OR ITCHING   Items with * indicate a potential emergency and should be followed up as soon as possible or go to the Emergency Department if any problems should occur.  Please show the CHEMOTHERAPY ALERT CARD or IMMUNOTHERAPY ALERT CARD at check-in to the Emergency  Department and triage nurse.  Should you have questions after your visit or need to cancel or reschedule your appointment, please contact [facility]  Dept: [phone]  and follow the prompts.  Office hours are 8:00 a.m. to 4:30 p.m. Monday - Friday. Please note that voicemails left after 4:00 p.m. may not be returned until the following business day.  We are closed weekends and major holidays. You have access to a nurse at all times for urgent questions. Please call the main number to the clinic Dept: [phone] and follow the prompts.   For any non-urgent questions, you may also contact your provider using MyChart. We now offer e-Visits for anyone 18 and older to request care online for non-urgent symptoms. For details visit [email or web address].   Also download the MyChart app! Go to the app store, search "MyChart", open the app, select [facility], and log in with your MyChart username and password.  Durvalumab Injection What is this medication? DURVALUMAB (dur VAL ue mab) treats some types of cancer. It works by helping your immune system slow or stop the spread of cancer cells. It is a monoclonal antibody. This medicine may be used for other purposes; ask your health care provider or pharmacist if you have questions. COMMON BRAND NAME(S): IMFINZI What should I tell my care team before I take this medication? They need to know if you have any of these conditions: Allogeneic stem cell transplant (uses someone else's stem cells) Autoimmune diseases, such as Crohn disease, ulcerative colitis, lupus History of chest radiation Nervous system problems, such as   Guillain-Barre syndrome, myasthenia gravis Organ transplant An unusual or allergic reaction to durvalumab, other medications, foods, dyes, or preservatives Pregnant or trying to get pregnant Breast-feeding How should I use this medication? This medication is infused into a vein. It is given by  your care team in a hospital or clinic setting. A special MedGuide will be given to you before each treatment. Be sure to read this information carefully each time. Talk to your care team about the use of this medication in children. Special care may be needed. Overdosage: If you think you have taken too much of this medicine contact a poison control center or emergency room at once. NOTE: This medicine is only for you. Do not share this medicine with others. What if I miss a dose? Keep appointments for follow-up doses. It is important not to miss your dose. Call your care team if you are unable to keep an appointment. What may interact with this medication? Interactions have not been studied. This list may not describe all possible interactions. Give your health care provider a list of all the medicines, herbs, non-prescription drugs, or dietary supplements you use. Also tell them if you smoke, drink alcohol, or use illegal drugs. Some items may interact with your medicine. What should I watch for while using this medication? Your condition will be monitored carefully while you are receiving this medication. You may need blood work while taking this medication. This medication may cause serious skin reactions. They can happen weeks to months after starting the medication. Contact your care team right away if you notice fevers or flu-like symptoms with a rash. The rash may be red or purple and then turn into blisters or peeling of the skin. You may also notice a red rash with swelling of the face, lips, or lymph nodes in your neck or under your arms. Tell your care team right away if you have any change in your eyesight. Talk to your care team if you may be pregnant. Serious birth defects can occur if you take this medication during pregnancy and for 3 months after the last dose. You will need a negative pregnancy test before starting this medication. Contraception is recommended while taking this  medication and for 3 months after the last dose. Your care team can help you find the option that works for you. Do not breastfeed while taking this medication and for 3 months after the last dose. What side effects may I notice from receiving this medication? Side effects that you should report to your care team as soon as possible: Allergic reactions--skin rash, itching, hives, swelling of the face, lips, tongue, or throat Dry cough, shortness of breath or trouble breathing Eye pain, redness, irritation, or discharge with blurry or decreased vision Heart muscle inflammation--unusual weakness or fatigue, shortness of breath, chest pain, fast or irregular heartbeat, dizziness, swelling of the ankles, feet, or hands Hormone gland problems--headache, sensitivity to light, unusual weakness or fatigue, dizziness, fast or irregular heartbeat, increased sensitivity to cold or heat, excessive sweating, constipation, hair loss, increased thirst or amount of urine, tremors or shaking, irritability Infusion reactions--chest pain, shortness of breath or trouble breathing, feeling faint or lightheaded Kidney injury (glomerulonephritis)--decrease in the amount of urine, red or dark brown urine, foamy or bubbly urine, swelling of the ankles, hands, or feet Liver injury--right upper belly pain, loss of appetite, nausea, light-colored stool, dark yellow or brown urine, yellowing skin or eyes, unusual weakness or fatigue Pain, tingling, or numbness in the hands   or feet, muscle weakness, change in vision, confusion or trouble speaking, loss of balance or coordination, trouble walking, seizures Rash, fever, and swollen lymph nodes Redness, blistering, peeling, or loosening of the skin, including inside the mouth Sudden or severe stomach pain, bloody diarrhea, fever, nausea, vomiting Side effects that usually do not require medical attention (report these to your care team if they continue or are bothersome): Bone,  joint, or muscle pain Diarrhea Fatigue Loss of appetite Nausea Skin rash This list may not describe all possible side effects. Call your doctor for medical advice about side effects. You may report side effects to FDA at 1-800-FDA-1088. Where should I keep my medication? This medication is given in a hospital or clinic. It will not be stored at home. NOTE: This sheet is a summary. It may not cover all possible information. If you have questions about this medicine, talk to your doctor, pharmacist, or health care provider.  2024 Elsevier/Gold Standard (2021-10-08 00:00:00)    

## 2022-12-11 ENCOUNTER — Encounter: Payer: Self-pay | Admitting: Internal Medicine

## 2022-12-15 ENCOUNTER — Encounter: Payer: Self-pay | Admitting: Emergency Medicine

## 2022-12-15 ENCOUNTER — Encounter: Payer: Self-pay | Admitting: Internal Medicine

## 2022-12-16 ENCOUNTER — Other Ambulatory Visit: Payer: Self-pay | Admitting: Emergency Medicine

## 2022-12-16 ENCOUNTER — Encounter: Payer: Self-pay | Admitting: Medical Oncology

## 2022-12-16 DIAGNOSIS — R059 Cough, unspecified: Secondary | ICD-10-CM

## 2022-12-16 DIAGNOSIS — H2512 Age-related nuclear cataract, left eye: Secondary | ICD-10-CM | POA: Diagnosis not present

## 2022-12-16 DIAGNOSIS — H269 Unspecified cataract: Secondary | ICD-10-CM | POA: Diagnosis not present

## 2022-12-16 NOTE — Telephone Encounter (Signed)
It is not uncommon for patients to get cough with this medication.  It can sometimes cause lung inflammation or pneumonitis.  It can also decrease patient's ability to fight off infection.  I think it would be reasonable for her to be seen either in our office or in the oncology office for the chest x-ray so we can ensure no changes associated with the medication.  Try to set her up for an acute OV with APP or RB with a chest x-ray.  Thank you

## 2022-12-16 NOTE — Telephone Encounter (Signed)
I called and spoke with the pt and scheduled for acute visit with Dr Delton Coombes for 3 pm tomorrow  CXR order was placed  Pt aware to arrive 20 min early  Nothing further needed

## 2022-12-17 ENCOUNTER — Other Ambulatory Visit: Payer: Self-pay

## 2022-12-17 ENCOUNTER — Ambulatory Visit (INDEPENDENT_AMBULATORY_CARE_PROVIDER_SITE_OTHER): Payer: Medicare Other | Admitting: Emergency Medicine

## 2022-12-17 ENCOUNTER — Encounter: Payer: Self-pay | Admitting: Emergency Medicine

## 2022-12-17 ENCOUNTER — Ambulatory Visit (INDEPENDENT_AMBULATORY_CARE_PROVIDER_SITE_OTHER): Payer: Medicare Other

## 2022-12-17 VITALS — BP 116/70 | HR 76 | Temp 98.1°F | Ht 63.0 in | Wt 118.0 lb

## 2022-12-17 DIAGNOSIS — Z85118 Personal history of other malignant neoplasm of bronchus and lung: Secondary | ICD-10-CM | POA: Diagnosis not present

## 2022-12-17 DIAGNOSIS — Z4682 Encounter for fitting and adjustment of non-vascular catheter: Secondary | ICD-10-CM | POA: Diagnosis not present

## 2022-12-17 DIAGNOSIS — R059 Cough, unspecified: Secondary | ICD-10-CM

## 2022-12-17 DIAGNOSIS — R918 Other nonspecific abnormal finding of lung field: Secondary | ICD-10-CM | POA: Diagnosis not present

## 2022-12-17 DIAGNOSIS — R0602 Shortness of breath: Secondary | ICD-10-CM

## 2022-12-17 NOTE — Progress Notes (Unsigned)
Subjective:    Patient ID: Evelyn Jordan, female    DOB: 1950-09-02, 72 y.o.   MRN: [id number]  HPI 72 year old former smoker (40 pack years) with little past medical history although she does have a history of pulmonary nodules.  She is referred today for hemoptysis and abnormal chest imaging. She is usually quite active.  She reports that she had been camping in [place] for several weeks and then had the acute onset of cough and bright red blood multiple times over an hour's time. She does not usually cough, has never seen blood before. No fevers, chills, sputum. She has kept d dry cough since, no more blood. No pain. Does now feel that she has a difficult time getting a full breath. No wt loss.   Chest x-ray 07/22/2022 reviewed by me shows a left perihilar opacification with some associated lingular volume loss.  CT scan of the chest 07/24/2022 reviewed by me shows no mediastinal or hilar adenopathy, focal narrowing of the left lingular bronchus with air bronchograms and consolidation and collapse of the lingula new compared with a prior CT 04/05/2020.  Scattered calcified granulomas are present  Acute OV 12/17/2022 --Evelyn Jordan is 56, former smoker whom I saw for hemoptysis and focal narrowing of the left lingular bronchus and a postobstructive airspace disease.  She underwent bronchoscopy on 08/04/2022 that identified squamous cell lung cancer.  She has been treated by Dr. Arbutus Ped, received chemoradiation and then started on Imfinzi 11/13/2022.  She underwent her second dose on.  She reports today that she has experienced increased cough, wheeze, exertional dyspnea. No fever, chills. Hydrocodone syrup and albuterol can help.   Chest x-ray performed today 12/17/2022 reviewed by me, shows some increased hazy opacity L mid lung. No consolidation.    Review of Systems As per HPI  Past Medical History:  Diagnosis Date   Depression    Insomnia    Lung cancer (HCC) 07/25/2022   Osteoporosis     Pulmonary nodule    Thyroid nodule      Family History  Problem Relation Age of Onset   Alzheimer's disease Mother    Hypertension Father    Stroke Father    Breast cancer Neg Hx      Social History   Socioeconomic History   Marital status: Married    Spouse name: Archie   Number of children: Not on file   Years of education: Not on file   Highest education level: Not on file  Occupational History    Comment: retired Engineer, civil (consulting)   Tobacco Use   Smoking status: Former    Packs/day: 1.00    Years: 40.00    Additional pack years: 0.00    Total pack years: 40.00    Types: Cigarettes    Start date: 37    Quit date: 2012    Years since quitting: 12.5   Smokeless tobacco: Never  Vaping Use   Vaping Use: Never used  Substance and Sexual Activity   Alcohol use: Yes    Comment: Rarely   Drug use: No   Sexual activity: Not on file  Other Topics Concern   Not on file  Social History Narrative   Not on file   Social Determinants of Health   Financial Resource Strain: Low Risk  (10/21/2022)   Overall Financial Resource Strain (CARDIA)    Difficulty of Paying Living Expenses: Not hard at all  Food Insecurity: No Food Insecurity (10/21/2022)   Hunger Vital Sign  Worried About Programme researcher, broadcasting/film/video in the Last Year: Never true    Ran Out of Food in the Last Year: Never true  Transportation Needs: No Transportation Needs (10/21/2022)   PRAPARE - Administrator, Civil Service (Medical): No    Lack of Transportation (Non-Medical): No  Physical Activity: Insufficiently Active (10/21/2022)   Exercise Vital Sign    Days of Exercise per Week: 7 days    Minutes of Exercise per Session: 20 min  Stress: No Stress Concern Present (10/21/2022)   [employer] of Occupational Health - Occupational Stress Questionnaire    Feeling of Stress : Not at all  Social Connections: Moderately Isolated (10/21/2022)   Social Connection and Isolation Panel [NHANES]    Frequency of  Communication with Friends and Family: More than three times a week    Frequency of Social Gatherings with Friends and Family: More than three times a week    Attends Religious Services: Never    Database administrator or Organizations: No    Attends Banker Meetings: Never    Marital Status: Married  Catering manager Violence: Not At Risk (10/21/2022)   Humiliation, Afraid, Rape, and Kick questionnaire    Fear of Current or Ex-Partner: No    Emotionally Abused: No    Physically Abused: No    Sexually Abused: No    From [place], has done school in [place]. Loves to camp No known TB testing, no hx known exposure.    Allergies  Allergen Reactions   Compazine [Prochlorperazine Edisylate]     Neck hyperextends     Outpatient Medications Prior to Visit  Medication Sig Dispense Refill   albuterol (PROVENTIL) (2.5 MG/3ML) 0.083% nebulizer solution Take 3 mLs (2.5 mg total) by nebulization every 6 (six) hours as needed for wheezing or shortness of breath. 75 mL 12   albuterol (VENTOLIN HFA) 108 (90 Base) MCG/ACT inhaler Inhale 2 puffs into the lungs every 6 (six) hours as needed for wheezing or shortness of breath. 8 g 6   ALPRAZolam (XANAX) 0.5 MG tablet Take 1 tablet (0.5 mg total) by mouth at bedtime as needed for anxiety or sleep. 30 tablet 5   azithromycin (ZITHROMAX Z-PAK) 250 MG tablet Use as directed 6 each 0   BESIVANCE 0.6 % SUSP Apply 1 drop to eye 3 (three) times daily.     Difluprednate 0.05 % EMUL Apply to eye.     docusate sodium (COLACE) 100 MG capsule Take 100 mg by mouth every other day.     HYDROcodone bit-homatropine (HYCODAN) 5-1.5 MG/5ML syrup Take 5 mLs by mouth every 6 (six) hours as needed for cough. 120 mL 0   lidocaine-prilocaine (EMLA) cream Apply 1 Application topically as needed. 30 g 2   PROLENSA 0.07 % SOLN Apply 1 drop to eye 2 (two) times daily.     No facility-administered medications prior to visit.        Objective:   Physical Exam  Vitals:    12/17/22 1458  BP: 116/70  Pulse: 76  Temp: 98.1 F (36.7 C)  TempSrc: Oral  SpO2: 99%  Weight: 118 lb (53.5 kg)  Height: 5\' 3"  (1.6 m)   Gen: Pleasant, well-nourished, in no distress,  normal affect  ENT: No lesions,  mouth clear,  oropharynx clear, no postnasal drip  Neck: No JVD, no stridor  Lungs: No use of accessory muscles, no crackles or wheezing on normal respiration, no wheeze on forced expiration  Cardiovascular:  RRR, heart sounds normal, no murmur or gallops, no peripheral edema  Musculoskeletal: No deformities, no cyanosis or clubbing  Neuro: alert, awake, non focal  Skin: Warm, no lesions or rash    Assessment & Plan:   No problem-specific Assessment & Plan notes found for this encounter.   Levy Pupa, MD, PhD 12/17/2022, 3:16 PM [facility] [phone] or if no answer before 7:00PM call [phone] For any issues after 7:00PM please call eLink [phone]

## 2022-12-17 NOTE — Patient Instructions (Addendum)
We reviewed your chest x-ray today.  There is no evidence of overt pneumonia.  There is some increased left midlung haziness that is most consistent with posttreatment changes. I suspect that your transient wheezing, cough, shortness of breath are related to your Imfinzi and the impact on your bronchial obstruction.  Please discuss the symptoms with Dr. Arbutus Ped as you prepare to have your third treatment next month Please keep your albuterol available to use 2 puffs or 1 nebulizer treatment if needed for shortness of breath, chest tightness, wheezing.  Keep track of whether this medication helps you so we can discuss. Get your repeat CT chest as planned by oncology Follow Dr. Delton Coombes as already scheduled next month

## 2022-12-18 ENCOUNTER — Encounter: Payer: Self-pay | Admitting: Medical Oncology

## 2022-12-18 DIAGNOSIS — R0600 Dyspnea, unspecified: Secondary | ICD-10-CM | POA: Insufficient documentation

## 2022-12-18 NOTE — Assessment & Plan Note (Signed)
Dyspnea cough and wheeze that seem to correspond time with her Imfinzi dosing.  She had similar symptoms after treatment #1 last month but not with the same severity.  She is starting to improve.  Her chest x-ray today does not show an overt pneumonia, does suggest some increased left midlung haziness, question posttreatment changes.  She has large airway noise/wheeze on exam, suspect related to her area of bronchial obstruction and inflammation induced by the Imfinzi. She is starting to improve. I believe we can hold off on abx or steroids, follow for improvement. I have asked her to keep albuterol available. She will need to discuss this pattern w Dr Arbutus Ped as she prepares for Imfinzi #3 next month   We reviewed your chest x-ray today.  There is no evidence of overt pneumonia.  There is some increased left midlung haziness that is most consistent with posttreatment changes. I suspect that your transient wheezing, cough, shortness of breath are related to your Imfinzi and the impact on your bronchial obstruction.  Please discuss the symptoms with Dr. Arbutus Ped as you prepare to have your third treatment next month Please keep your albuterol available to use 2 puffs or 1 nebulizer treatment if needed for shortness of breath, chest tightness, wheezing.  Keep track of whether this medication helps you so we can discuss. Get your repeat CT chest as planned by oncology Follow Dr. Delton Coombes as already scheduled next month

## 2022-12-22 ENCOUNTER — Telehealth: Payer: Self-pay | Admitting: Emergency Medicine

## 2022-12-22 ENCOUNTER — Encounter: Payer: Self-pay | Admitting: Internal Medicine

## 2022-12-22 NOTE — Telephone Encounter (Signed)
Tiffany calling with call report. For chest xray. Tiffany phone number is [phone]. 

## 2022-12-22 NOTE — Telephone Encounter (Signed)
Spoke with Tiffany from [facility] calling in regards to Chest xray results. The impression has been posted below  IMPRESSION: 1. Interval increased heterogeneous left perihilar pulmonary opacity. While this may represent posttreatment changes, progressive malignancy and/or infection is also possible. Recommend further assessment with chest CT with contrast. 2. Possible new 5 mm nodular opacity in the right upper lobe. Recommend attention on chest CT.

## 2022-12-23 ENCOUNTER — Other Ambulatory Visit: Payer: Self-pay | Admitting: Internal Medicine

## 2022-12-23 ENCOUNTER — Encounter: Payer: Self-pay | Admitting: *Deleted

## 2022-12-23 DIAGNOSIS — C3490 Malignant neoplasm of unspecified part of unspecified bronchus or lung: Secondary | ICD-10-CM

## 2022-12-24 NOTE — Telephone Encounter (Signed)
Thank you :)

## 2022-12-31 ENCOUNTER — Encounter: Payer: Self-pay | Admitting: Internal Medicine

## 2023-01-01 ENCOUNTER — Ambulatory Visit (HOSPITAL_COMMUNITY)
Admission: RE | Admit: 2023-01-01 | Discharge: 2023-01-01 | Disposition: A | Discharge status: Home / Self Care | Payer: Medicare Other | Source: Ambulatory Visit | Attending: Internal Medicine | Admitting: Internal Medicine

## 2023-01-01 DIAGNOSIS — I3139 Other pericardial effusion (noninflammatory): Secondary | ICD-10-CM | POA: Diagnosis not present

## 2023-01-01 DIAGNOSIS — R918 Other nonspecific abnormal finding of lung field: Secondary | ICD-10-CM | POA: Diagnosis not present

## 2023-01-01 DIAGNOSIS — C3490 Malignant neoplasm of unspecified part of unspecified bronchus or lung: Secondary | ICD-10-CM | POA: Insufficient documentation

## 2023-01-01 DIAGNOSIS — J90 Pleural effusion, not elsewhere classified: Secondary | ICD-10-CM | POA: Diagnosis not present

## 2023-01-01 MED ORDER — IOHEXOL 300 MG/ML  SOLN
75.0000 mL | Freq: Once | INTRAMUSCULAR | Status: AC | PRN
  Administered 2023-01-01: 75 mL via INTRAVENOUS

## 2023-01-08 ENCOUNTER — Ambulatory Visit: Payer: Medicare Other

## 2023-01-08 ENCOUNTER — Ambulatory Visit: Payer: Medicare Other | Admitting: Physician Assistant

## 2023-01-08 ENCOUNTER — Other Ambulatory Visit: Payer: Medicare Other

## 2023-01-11 ENCOUNTER — Encounter: Payer: Self-pay | Admitting: Emergency Medicine

## 2023-01-14 NOTE — Progress Notes (Unsigned)
[facility] OFFICE PROGRESS NOTE  Evelyn Ora, MD [address] [place] [address]  DIAGNOSIS: Stage IIIB (T3, N2, M0) non-small cell lung cancer, squamous cell carcinoma presented with obstructive left lingular mass with consolidation and collapse of the lingula as well as part of the left upper lobe and likely invasion of the mediastinum diagnosed in February 2024.    Biomarker Findings Microsatellite status - MS-Stable Tumor Mutational Burden - 5 Muts/Mb Genomic Findings For a complete list of the genes assayed, please refer to the Appendix. CCND1 amplification FGFR1 amplification MDM2 amplification FGF19 amplification FGF3 amplification FGF4 amplification TP53 Q136* 8 Disease relevant genes with no reportable alterations: ALK, BRAF, EGFR, ERBB2, KRAS, MET, RET, ROS1   PDL1: 5%  PRIOR THERAPY: Concurrent chemoradiation with weekly carboplatin for AUC of 2 and paclitaxel 45 Mg/M2. Status post 7 weeks of treatment.  Last dose was given October 06, 2022 with partial response.  CURRENT THERAPY: Consolidation treatment with immunotherapy with Imfinzi 1500 Mg IV every 4 weeks.  First dose on November 13, 2022. Status post 2 cycles. ***  INTERVAL HISTORY: Evelyn Jordan 72 y.o. female returns to clinic today for follow-up visit.  The patient was last seen by Dr. Arbutus Ped 1 month ago.  The patient is currently on consolidation immunotherapy with Imfinzi.  The patient called in the interval with pain in her esophagus after her second infusion with Imfinzi.  She has concerns about adverse side effects of Imfinzi.  She also endorsed increased episode of coughing and wheezing.  Had concerns about her CO2 level and her renal function.  She followed up with Dr. Delton Coombes on 12/17/2022.  She had a chest x-ray performed that day which did not show any overt pneumonia but did suggest some increased left midlung haziness and questionable posttreatment changes.  She also has large  airway noise/wheezing on his exams could be induced by immunotherapy.  Dr. Arbutus Ped recommended a restaging CT scan of the chest.  Send her azithromycin in the meantime until her CT scan results were finalized.  Dr. Delton Coombes and Dr. Arbutus Ped have been coordinating.  They decided ***  The patient denies any fever, chills, or night sweats.  Weight loss?  Cough, shortness of breath?  Denies any chest pain or hemoptysis.  Denies any nausea, vomiting, diarrhea, or constipation.  Denies any headache or visual changes.  Denies any rashes or skin changes.  She recently had a restaging CT scan performed.  She is here today for evaluation and to review her scan results and to discuss the next steps in her care.          MEDICAL HISTORY: Past Medical History:  Diagnosis Date   Depression    Insomnia    Lung cancer (HCC) 07/25/2022   Osteoporosis    Pulmonary nodule    Thyroid nodule     ALLERGIES:  is allergic to compazine [prochlorperazine edisylate].  MEDICATIONS:  Current Outpatient Medications  Medication Sig Dispense Refill   albuterol (PROVENTIL) (2.5 MG/3ML) 0.083% nebulizer solution Take 3 mLs (2.5 mg total) by nebulization every 6 (six) hours as needed for wheezing or shortness of breath. 75 mL 12   albuterol (VENTOLIN HFA) 108 (90 Base) MCG/ACT inhaler Inhale 2 puffs into the lungs every 6 (six) hours as needed for wheezing or shortness of breath. 8 g 6   ALPRAZolam (XANAX) 0.5 MG tablet Take 1 tablet (0.5 mg total) by mouth at bedtime as needed for anxiety or sleep. 30 tablet  5   azithromycin (ZITHROMAX Z-PAK) 250 MG tablet Use as directed 6 each 0   BESIVANCE 0.6 % SUSP Apply 1 drop to eye 3 (three) times daily.     Difluprednate 0.05 % EMUL Apply to eye.     docusate sodium (COLACE) 100 MG capsule Take 100 mg by mouth every other day.     HYDROcodone bit-homatropine (HYCODAN) 5-1.5 MG/5ML syrup Take 5 mLs by mouth every 6 (six) hours as needed for cough. 120 mL 0    lidocaine-prilocaine (EMLA) cream Apply 1 Application topically as needed. 30 g 2   PROLENSA 0.07 % SOLN Apply 1 drop to eye 2 (two) times daily.     No current facility-administered medications for this visit.    SURGICAL HISTORY:  Past Surgical History:  Procedure Laterality Date   ANKLE SURGERY Right    BREAST BIOPSY Right    benign 2013   BRONCHIAL BIOPSY  08/04/2022   Procedure: BRONCHIAL BIOPSIES;  Surgeon: Leslye Peer, MD;  Location: [facility] ENDOSCOPY;  Service: Cardiopulmonary;;   BRONCHIAL BRUSHINGS  08/04/2022   Procedure: BRONCHIAL BRUSHINGS;  Surgeon: Leslye Peer, MD;  Location: [facility] ENDOSCOPY;  Service: Cardiopulmonary;;   HEMOSTASIS CONTROL  08/04/2022   Procedure: HEMOSTASIS CONTROL;  Surgeon: Leslye Peer, MD;  Location: [facility] ENDOSCOPY;  Service: Cardiopulmonary;;   IR IMAGING GUIDED PORT INSERTION  09/04/2022   repair of a torn meniscus Left 05/2018   VIDEO BRONCHOSCOPY Left 08/04/2022   Procedure: VIDEO BRONCHOSCOPY WITH FLUORO;  Surgeon: Leslye Peer, MD;  Location: [facility] ENDOSCOPY;  Service: Cardiopulmonary;  Laterality: Left;    REVIEW OF SYSTEMS:   Review of Systems  Constitutional: Negative for appetite change, chills, fatigue, fever and unexpected weight change.  HENT:   Negative for mouth sores, nosebleeds, sore throat and trouble swallowing.   Eyes: Negative for eye problems and icterus.  Respiratory: Negative for cough, hemoptysis, shortness of breath and wheezing.   Cardiovascular: Negative for chest pain and leg swelling.  Gastrointestinal: Negative for abdominal pain, constipation, diarrhea, nausea and vomiting.  Genitourinary: Negative for bladder incontinence, difficulty urinating, dysuria, frequency and hematuria.   Musculoskeletal: Negative for back pain, gait problem, neck pain and neck stiffness.  Skin: Negative for itching and rash.  Neurological: Negative for dizziness, extremity weakness, gait problem, headaches, light-headedness and seizures.   Hematological: Negative for adenopathy. Does not bruise/bleed easily.  Psychiatric/Behavioral: Negative for confusion, depression and sleep disturbance. The patient is not nervous/anxious.     PHYSICAL EXAMINATION:  There were no vitals taken for this visit.  ECOG PERFORMANCE STATUS: {CHL ONC ECOG [id number]  Physical Exam  Constitutional: Oriented to person, place, and time and well-developed, well-nourished, and in no distress. No distress.  HENT:  Head: Normocephalic and atraumatic.  Mouth/Throat: Oropharynx is clear and moist. No oropharyngeal exudate.  Eyes: Conjunctivae are normal. Right eye exhibits no discharge. Left eye exhibits no discharge. No scleral icterus.  Neck: Normal range of motion. Neck supple.  Cardiovascular: Normal rate, regular rhythm, normal heart sounds and intact distal pulses.   Pulmonary/Chest: Effort normal and breath sounds normal. No respiratory distress. No wheezes. No rales.  Abdominal: Soft. Bowel sounds are normal. Exhibits no distension and no mass. There is no tenderness.  Musculoskeletal: Normal range of motion. Exhibits no edema.  Lymphadenopathy:    No cervical adenopathy.  Neurological: Alert and oriented to person, place, and time. Exhibits normal muscle tone. Gait normal. Coordination normal.  Skin: Skin is warm and dry. No rash noted.  Not diaphoretic. No erythema. No pallor.  Psychiatric: Mood, memory and judgment normal.  Vitals reviewed.  LABORATORY DATA: Lab Results  Component Value Date   WBC 5.7 12/10/2022   HGB 12.6 12/10/2022   HCT 38.3 12/10/2022   MCV 99.5 12/10/2022   PLT 191 12/10/2022      Chemistry      Component Value Date/Time   NA 140 12/10/2022 1331   NA 141 03/07/2015 0000   K 3.9 12/10/2022 1331   CL 113 (H) 12/10/2022 1331   CO2 21 (L) 12/10/2022 1331   BUN 19 12/10/2022 1331   CREATININE 0.76 12/10/2022 1331   GLU 103 03/07/2015 0000      Component Value Date/Time   CALCIUM 9.2 12/10/2022 1331    ALKPHOS 63 12/10/2022 1331   AST 16 12/10/2022 1331   ALT 11 12/10/2022 1331   BILITOT 0.3 12/10/2022 1331       RADIOGRAPHIC STUDIES:  CT Chest W Contrast  Result Date: 01/08/2023 CLINICAL DATA:  Non-small-cell lung cancer staging. * Tracking Code: BO *. Prior radiation and chemotherapy EXAM: CT CHEST WITH CONTRAST TECHNIQUE: Multidetector CT imaging of the chest was performed during intravenous contrast administration. RADIATION DOSE REDUCTION: This exam was performed according to the departmental dose-optimization program which includes automated exposure control, adjustment of the mA and/or kV according to patient size and/or use of iterative reconstruction technique. CONTRAST:  75mL OMNIPAQUE IOHEXOL 300 MG/ML  SOLN COMPARISON:  11/04/2022 FINDINGS: Cardiovascular: Small pericardial effusion which is increased from previous. The heart is nonenlarged. Coronary artery calcifications are seen. The thoracic aorta has a normal course and caliber with partially calcified atherosclerotic plaque. There is a right IJ chest port. The port is accessed. Tip of the catheter extends into the right atrium. Mediastinum/Nodes: Persistent edema and fluid in the mediastinum which is overall slightly increased. The thoracic esophagus kidneys show some wall thickening and a slightly patulous appearance mid to distal. Please correlate for any clinical evidence of esophagitis. Preserved thyroid gland. No specific abnormal lymph node enlargement identified in the axillary regions, hilum or mediastinum. Lungs/Pleura: Right lung has some benign central calcified nodules consistent with old granulomatous disease, similar to previous. There is noncalcified small right lower lobe nodule which is stable from previous measuring 4 mm on series 6, image 78. Areas of scattered interstitial septal thickening with minimal ground-glass in the right lung, slightly increased from previous. There is some volume loss along the left  hemithorax. Slight increase in small left pleural effusion. There are patchy areas of opacity seen in the left lung particularly superior segment left lower lobe, perihilar and posterior left upper lobe. These are also increasing from previous. There are areas of irregular bronchiectasis in the left upper lobe. The central areas of bronchial narrowing is similar to previous. Again calcified left-sided lung nodules are seen. The central low-density area amongst the opacity on the prior measured 2.8 x 2.5 cm in the left upper lobe this area today when measured in a similar fashion for continuity on series 2, image 72 measures 2.9 x 2.3 cm, similar when adjusting for technique. Upper Abdomen: Adrenal glands are preserved in the upper abdomen. Musculoskeletal: Mild curvature of the spine with osteopenia. Scattered degenerative changes. IMPRESSION: Left perihilar lingular mass again seen, similar in size compared to the prior examination. The adjacent parenchymal opacities in the lingula and left lower lobe are increasing. There is increasing small left effusion. Increasing mild patchy ground-glass opacity in the dependent right lung with some increasing  interstitial change Increasing mediastinal edema and pericardial effusion. Persistent wall thickening along the esophagus. Please correlate for esophagitis. No developing new lymph node enlargement or other mass lesion. Fatty liver infiltration. Aortic Atherosclerosis (ICD10-I70.0). Electronically Signed   By: Karen Kays M.D.   On: 01/08/2023 21:42   DG Chest 2 View  Result Date: 12/22/2022 CLINICAL DATA:  History of non-small cell lung cancer. EXAM: CHEST - 2 VIEW COMPARISON:  Chest x-ray July 22, 2022, chest CT Nov 04, 2022 FINDINGS: Interval placement of a right chest wall port catheter, which terminates near the superior cavoatrial junction. The cardiomediastinal silhouette is unchanged in contour. Interval increased left perihilar heterogeneous pulmonary  opacity. Possible new 5 mm nodular opacity in the right upper lobe. Stable calcified pulmonary nodules in the right lung. No pleural effusion or pneumothorax. The visualized upper abdomen is unremarkable. No acute osseous abnormality. IMPRESSION: 1. Interval increased heterogeneous left perihilar pulmonary opacity. While this may represent posttreatment changes, progressive malignancy and/or infection is also possible. Recommend further assessment with chest CT with contrast. 2. Possible new 5 mm nodular opacity in the right upper lobe. Recommend attention on chest CT. These results will be called to the ordering clinician or representative by the Radiologist Assistant, and communication documented in the PACS or [employer]. Electronically Signed   By: Jacob Moores M.D.   On: 12/22/2022 16:31     ASSESSMENT/PLAN:  This is a very pleasant 72 year old Caucasian female diagnosed with stage IIIb (T3, N2, M0) non-small cell lung cancer, squamous cell carcinoma.  The patient presented with an obstructive left lingular mass with consolidation and collapse of the lingula as well as part of the left upper lobe and likely invasion of the mediastinum.  She was diagnosed in February 2024.  Her molecular studies by foundation 1 showed no actionable mutations and her PD-L1 expression is 5%.  She completed a course of concurrent chemoradiation with carboplatin for AUC of 2 and paclitaxel 45 mg/m.  She status post Avan cycles.  Her last dose was given on 10/06/2022 with a partial response.  She is currently undergoing consolidation immunotherapy with Imfinzi 1500 mg IV every 4 weeks.  She is status post 2 cycles.  She has been struggling with some cough following treatment.  The patient recently had a restaging CT scan performed.  Dr. Arbutus Ped personally and independently reviewed the scan and discussed the results with the patient today.  The scan showed* *  Dr. Arbutus Ped recommends discontinuing immunotherapy  and starting the patient on a high dose prednisone taper for possible pneumonitis.  We will repeat a CT scan of her chest and ***to assess for improvement in her condition.  Port?  Flushes  Follow-up  ***Effects of steroids.    The patient was advised to call immediately if she has any concerning symptoms in the interval. The patient voices understanding of current disease status and treatment options and is in agreement with the current care plan. All questions were answered. The patient knows to call the clinic with any problems, questions or concerns. We can certainly see the patient much sooner if necessary *  No orders of the defined types were placed in this encounter.    I spent {CHL ONC TIME VISIT - WUJWJ:[id number]} counseling the patient face to face. The total time spent in the appointment was {CHL ONC TIME VISIT - OZHYQ:[id number]}.   L , PA-C 01/14/23

## 2023-01-15 ENCOUNTER — Inpatient Hospital Stay: Payer: Medicare Other

## 2023-01-15 ENCOUNTER — Other Ambulatory Visit: Payer: Medicare Other

## 2023-01-15 ENCOUNTER — Other Ambulatory Visit: Payer: Self-pay | Admitting: Physician Assistant

## 2023-01-15 ENCOUNTER — Other Ambulatory Visit: Payer: Self-pay

## 2023-01-15 ENCOUNTER — Inpatient Hospital Stay: Payer: Medicare Other | Attending: Internal Medicine | Admitting: Physician Assistant

## 2023-01-15 ENCOUNTER — Other Ambulatory Visit: Payer: Self-pay | Admitting: Internal Medicine

## 2023-01-15 DIAGNOSIS — Z7952 Long term (current) use of systemic steroids: Secondary | ICD-10-CM | POA: Insufficient documentation

## 2023-01-15 DIAGNOSIS — C3412 Malignant neoplasm of upper lobe, left bronchus or lung: Secondary | ICD-10-CM | POA: Diagnosis not present

## 2023-01-15 DIAGNOSIS — C3490 Malignant neoplasm of unspecified part of unspecified bronchus or lung: Secondary | ICD-10-CM

## 2023-01-15 DIAGNOSIS — R059 Cough, unspecified: Secondary | ICD-10-CM | POA: Insufficient documentation

## 2023-01-15 DIAGNOSIS — Z79899 Other long term (current) drug therapy: Secondary | ICD-10-CM | POA: Insufficient documentation

## 2023-01-15 DIAGNOSIS — Z95828 Presence of other vascular implants and grafts: Secondary | ICD-10-CM

## 2023-01-15 DIAGNOSIS — R053 Chronic cough: Secondary | ICD-10-CM

## 2023-01-15 LAB — CBC WITH DIFFERENTIAL (CANCER CENTER ONLY)
Abs Immature Granulocytes: 0.02 10*3/uL (ref 0.00–0.07)
Basophils Absolute: 0 10*3/uL (ref 0.0–0.1)
Basophils Relative: 1 %
Eosinophils Absolute: 0.1 10*3/uL (ref 0.0–0.5)
Eosinophils Relative: 3 %
HCT: 34 % — ABNORMAL LOW (ref 36.0–46.0)
Hemoglobin: 11.4 g/dL — ABNORMAL LOW (ref 12.0–15.0)
Immature Granulocytes: 1 %
Lymphocytes Relative: 8 %
Lymphs Abs: 0.3 10*3/uL — ABNORMAL LOW (ref 0.7–4.0)
MCH: 32.1 pg (ref 26.0–34.0)
MCHC: 33.5 g/dL (ref 30.0–36.0)
MCV: 95.8 fL (ref 80.0–100.0)
Monocytes Absolute: 0.8 10*3/uL (ref 0.1–1.0)
Monocytes Relative: 18 %
Neutro Abs: 2.9 10*3/uL (ref 1.7–7.7)
Neutrophils Relative %: 69 %
Platelet Count: 246 10*3/uL (ref 150–400)
RBC: 3.55 MIL/uL — ABNORMAL LOW (ref 3.87–5.11)
RDW: 11.8 % (ref 11.5–15.5)
WBC Count: 4.2 10*3/uL (ref 4.0–10.5)
nRBC: 0 % (ref 0.0–0.2)

## 2023-01-15 LAB — CMP (CANCER CENTER ONLY)
ALT: 12 U/L (ref 0–44)
AST: 13 U/L — ABNORMAL LOW (ref 15–41)
Albumin: 3.3 g/dL — ABNORMAL LOW (ref 3.5–5.0)
Alkaline Phosphatase: 61 U/L (ref 38–126)
Anion gap: 5 (ref 5–15)
BUN: 15 mg/dL (ref 8–23)
CO2: 26 mmol/L (ref 22–32)
Calcium: 8.6 mg/dL — ABNORMAL LOW (ref 8.9–10.3)
Chloride: 109 mmol/L (ref 98–111)
Creatinine: 0.62 mg/dL (ref 0.44–1.00)
GFR, Estimated: >60 mL/min (ref >60)
Glucose, Bld: 87 mg/dL (ref 70–99)
Potassium: 4.2 mmol/L (ref 3.5–5.1)
Sodium: 140 mmol/L (ref 135–145)
Total Bilirubin: 0.3 mg/dL (ref 0.3–1.2)
Total Protein: 6.1 g/dL — ABNORMAL LOW (ref 6.5–8.1)

## 2023-01-15 LAB — TSH: TSH: 0.013 u[IU]/mL — ABNORMAL LOW (ref 0.350–4.500)

## 2023-01-15 MED ORDER — HYDROCODONE BIT-HOMATROP MBR 5-1.5 MG/5ML PO SOLN
5.0000 mL | Freq: Four times a day (QID) | ORAL | 0 refills | Status: DC | PRN
Start: 2023-01-15 — End: 2023-04-28

## 2023-01-15 MED ORDER — SODIUM CHLORIDE 0.9% FLUSH
10.0000 mL | Freq: Once | INTRAVENOUS | Status: AC
  Administered 2023-01-15: 10 mL

## 2023-01-15 MED ORDER — PREDNISONE 10 MG PO TABS
ORAL_TABLET | ORAL | 0 refills | Status: DC
Start: 2023-01-15 — End: 2023-02-02

## 2023-01-16 ENCOUNTER — Other Ambulatory Visit: Payer: Self-pay

## 2023-01-16 DIAGNOSIS — C3490 Malignant neoplasm of unspecified part of unspecified bronchus or lung: Secondary | ICD-10-CM

## 2023-01-22 ENCOUNTER — Encounter: Payer: Self-pay | Admitting: Emergency Medicine

## 2023-01-22 ENCOUNTER — Ambulatory Visit (INDEPENDENT_AMBULATORY_CARE_PROVIDER_SITE_OTHER): Payer: Medicare Other | Admitting: Emergency Medicine

## 2023-01-22 VITALS — BP 104/62 | HR 77 | Temp 97.3°F | Ht 63.0 in | Wt 115.6 lb

## 2023-01-22 DIAGNOSIS — R9389 Abnormal findings on diagnostic imaging of other specified body structures: Secondary | ICD-10-CM | POA: Diagnosis not present

## 2023-01-22 DIAGNOSIS — Z720 Tobacco use: Secondary | ICD-10-CM

## 2023-01-22 NOTE — Assessment & Plan Note (Signed)
Former tobacco.  Suspect she does have some COPD, at least she is at risk for it.  Good functional capacity until she developed this episode of pneumonitis.  We will hold off on bronchodilators for now.  I will check her PFT when she is stable from these acute issues.

## 2023-01-22 NOTE — Patient Instructions (Signed)
Agree with prednisone as ordered by Dr. Arbutus Ped.  Complete this as planned. If you notice that your breathing begins to backtrack as the prednisone is tapered then please let either Dr. Delton Coombes or Dr. Arbutus Ped know so we can adjust Get your repeat CT scan of the chest in about 2 months as planned Follow Dr. Delton Coombes in 2 months

## 2023-01-22 NOTE — Assessment & Plan Note (Signed)
Clinical history and CT chest are consistent with pneumonitis, pleural effusion and pericardial effusion related to her Imfinzi.  Dr. Arbutus Ped has stopped it and has started her on a prednisone taper.  She will let us know if she backtracks clinically as the prednisone is tapered.  If so we could treat her with a slightly higher dose for slightly longer.  She has a surveillance CT scheduled for 2 months to ensure that the pneumonitis clears.

## 2023-01-22 NOTE — Progress Notes (Signed)
Subjective:    Patient ID: Evelyn Jordan, female    DOB: [birth year], 72 y.o.   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  I saw her in July when she noticed progressive dyspnea, cough, wheeze, questionably related in time to the Imfinzi dosing.  She continues to deal with persistent dry cough.  CT chest with some increased inflammatory change in the posterior left upper lobe and anterior left lower lobe.  She was given narcotic cough syrup.  Based on her most recent CT her immunotherapy was discontinued and she was started on prednisone taper.  Plan to repeat her chest CT in about 2 months. She has made a remarkable improvement on the prednisone.   CT chest 01/01/2023 reviewed by me, showed left-sided volume loss, patchy opacities in the left lung especially superior segment left lower lobe, posterior left upper lobe that have increased.  There are some associated irregular bronchiectasis.  Stable calcified left-sided pulmonary nodules.  There was esophageal wall thickening, increased mediastinal edema and pericardial effusion.   Review of Systems As per HPI  Past Medical History:  Diagnosis Date   Depression    Insomnia    Lung cancer (HCC) 07/25/2022   Osteoporosis    Pulmonary nodule    Thyroid nodule      Family History  Problem Relation Age of Onset   Alzheimer's disease Mother    Hypertension Father    Stroke Father    Breast cancer Neg Hx      Social History   Socioeconomic History   Marital status: Married    Spouse name: Archie   Number of children: Not on file   Years of education: Not on file   Highest education level: Not on file  Occupational History    Comment: retired Engineer, civil (consulting)   Tobacco Use   Smoking status: Former    Current packs/day: 0.00    Average packs/day: 1 pack/day for 41.0 years (41.0 ttl pk-yrs)    Types:  Cigarettes    Start date: 71    Quit date: 2012    Years since quitting: 12.6   Smokeless tobacco: Never  Vaping Use   Vaping status: Never Used  Substance and Sexual Activity   Alcohol use: Yes    Comment: Rarely   Drug use: No   Sexual activity: Not on file  Other Topics Concern   Not on file  Social History Narrative   Not on file   Social Determinants of Health   Financial Resource Strain: Low Risk  (10/21/2022)   Overall Financial Resource Strain (CARDIA)    Difficulty of Paying Living Expenses: Not hard at all  Food Insecurity: No Food Insecurity (10/21/2022)   Hunger Vital Sign    Worried About Running Out of Food in the Last Year: Never true    Ran Out of Food in the Last Year: Never true  Transportation Needs: No Transportation Needs (10/21/2022)   PRAPARE - Administrator, Civil Service (Medical): No    Lack of Transportation (Non-Medical): No  Physical Activity: Insufficiently Active (10/21/2022)   Exercise Vital Sign    Days of Exercise per Week: 7 days    Minutes of Exercise per Session: 20 min  Stress: No Stress Concern Present (10/21/2022)   [employer] of Occupational Health - Occupational Stress Questionnaire    Feeling of Stress :  Not at all  Social Connections: Moderately Isolated (10/21/2022)   Social Connection and Isolation Panel [NHANES]    Frequency of Communication with Friends and Family: More than three times a week    Frequency of Social Gatherings with Friends and Family: More than three times a week    Attends Religious Services: Never    Database administrator or Organizations: No    Attends Banker Meetings: Never    Marital Status: Married  Catering manager Violence: Not At Risk (10/21/2022)   Humiliation, Afraid, Rape, and Kick questionnaire    Fear of Current or Ex-Partner: No    Emotionally Abused: No    Physically Abused: No    Sexually Abused: No    From [place], has done school in [place]. Loves to camp No  known TB testing, no hx known exposure.    Allergies  Allergen Reactions   Compazine [Prochlorperazine Edisylate]     Neck hyperextends     Outpatient Medications Prior to Visit  Medication Sig Dispense Refill   albuterol (PROVENTIL) (2.5 MG/3ML) 0.083% nebulizer solution Take 3 mLs (2.5 mg total) by nebulization every 6 (six) hours as needed for wheezing or shortness of breath. 75 mL 12   albuterol (VENTOLIN HFA) 108 (90 Base) MCG/ACT inhaler Inhale 2 puffs into the lungs every 6 (six) hours as needed for wheezing or shortness of breath. 8 g 6   ALPRAZolam (XANAX) 0.5 MG tablet Take 1 tablet (0.5 mg total) by mouth at bedtime as needed for anxiety or sleep. 30 tablet 5   docusate sodium (COLACE) 100 MG capsule Take 100 mg by mouth every other day.     HYDROcodone bit-homatropine (HYCODAN) 5-1.5 MG/5ML syrup Take 5 mLs by mouth every 6 (six) hours as needed for cough. 120 mL 0   lidocaine-prilocaine (EMLA) cream Apply 1 Application topically as needed. 30 g 2   predniSONE (DELTASONE) 10 MG tablet Please take 5 tablets (50 mg) by mouth daily for 1 week, then take 4 tablets (40 mg) daily for 1 week, followed by 3 tablets (30 mg) daily for 1 week, followed by 2 tablets (20 mg) daily for 1 week, followed 1 tablet (10 mg) daily for 1 week, then stop 105 tablet 0   azithromycin (ZITHROMAX Z-PAK) 250 MG tablet Use as directed 6 each 0   BESIVANCE 0.6 % SUSP Apply 1 drop to eye 3 (three) times daily.     Difluprednate 0.05 % EMUL Apply to eye.     PROLENSA 0.07 % SOLN Apply 1 drop to eye 2 (two) times daily.     No facility-administered medications prior to visit.        Objective:   Physical Exam  Vitals:   01/22/23 1459  BP: 104/62  Pulse: 77  Temp: (!) 97.3 F (36.3 C)  TempSrc: Temporal  SpO2: 97%  Weight: 115 lb 9.6 oz (52.4 kg)  Height: 5\' 3"  (1.6 m)    Gen: Pleasant, well-nourished, in no distress,  normal affect  ENT: No lesions,  mouth clear,  oropharynx clear, no  postnasal drip  Neck: No JVD, no stridor  Lungs: No use of accessory muscles, focal large airway noise and wheeze on L  Cardiovascular: RRR, heart sounds normal, no murmur or gallops, no peripheral edema  Musculoskeletal: No deformities, no cyanosis or clubbing  Neuro: alert, awake, non focal  Skin: Warm, no lesions or rash    Assessment & Plan:   Abnormal CT of the chest Clinical  history and CT chest are consistent with pneumonitis, pleural effusion and pericardial effusion related to her Imfinzi.  Dr. Arbutus Ped has stopped it and has started her on a prednisone taper.  She will let us know if she backtracks clinically as the prednisone is tapered.  If so we could treat her with a slightly higher dose for slightly longer.  She has a surveillance CT scheduled for 2 months to ensure that the pneumonitis clears.  Tobacco use Former tobacco.  Suspect she does have some COPD, at least she is at risk for it.  Good functional capacity until she developed this episode of pneumonitis.  We will hold off on bronchodilators for now.  I will check her PFT when she is stable from these acute issues.    Levy Pupa, MD, PhD 01/22/2023, 3:18 PM [facility] [phone] or if no answer before 7:00PM call [phone] For any issues after 7:00PM please call eLink [phone]

## 2023-01-29 ENCOUNTER — Ambulatory Visit: Payer: Medicare Other | Admitting: Cardiology

## 2023-01-29 ENCOUNTER — Encounter: Payer: Self-pay | Admitting: Internal Medicine

## 2023-01-29 ENCOUNTER — Encounter: Payer: Self-pay | Admitting: Cardiology

## 2023-01-29 VITALS — BP 121/63 | HR 75

## 2023-01-29 DIAGNOSIS — R9389 Abnormal findings on diagnostic imaging of other specified body structures: Secondary | ICD-10-CM | POA: Diagnosis not present

## 2023-01-29 DIAGNOSIS — I3139 Other pericardial effusion (noninflammatory): Secondary | ICD-10-CM

## 2023-01-29 DIAGNOSIS — C3490 Malignant neoplasm of unspecified part of unspecified bronchus or lung: Secondary | ICD-10-CM

## 2023-01-29 NOTE — Progress Notes (Signed)
ID:  CATHERYNE MELCHER, DOB [birth year], MRN [id number]  PCP:  [name], MD  Cardiologist:  Tessa Lerner, DO, [facility] (established care 01/29/23)  REASON FOR CONSULT: Pericardial effusion  REQUESTING PHYSICIAN:  [name], MD [address] [place],  [place] [address]  Chief Complaint  Patient presents with   Pericardial Effusion   New Patient (Initial Visit)    HPI  Evelyn Jordan is a 72 y.o. Caucasian female who presents to the clinic for evaluation of pericardial effusion at the request of [name], MD. Her past medical history and cardiovascular risk factors include: Squamous cell lung cancer of the left lingular bronchus treated with chemoradiation, former smoker, aortic atherosclerosis, pericardial effusion (CT scan July 2024).  A very pleasant patient and inform her nurse who presents to the office for evaluation for newly discovered pericardial effusion on CT of the chest.  In February 2024 she was diagnosed with lung cancer and underwent chemoradiation until May 2024.  She was started on immunotherapy in June/July 2024 and had 2 doses and thereafter developed wheezing, coughing, and worsening shortness of breath.  Upon further evaluation she had a CT chest which noted laboratory findings including pericardial effusion.  Patient has been started on tapered dose of steroids and clinically has been feeling less wheezing and coughing.  However dyspnea on exertion is still present.  Overall functional capacity remains stable.  Patient states that 2 weeks ago she was climbing a mountain and yesterday was able to play pickle ball and walks for miles.  However with overexertion for dyspnea resurfaces.  She denies any lightheadedness, dizziness, palpitations, near-syncope or syncopal event.  She is overall euvolemic.  FUNCTIONAL STATUS: Two weeks was climbing mountain.  Yesterday was playing pickle ball and walked 4 miles.    CARDIAC DATABASE: EKG: 01/29/2023:  Sinus rhythm, 67 bpm, T WI in V1 and V2 likely normal variant but anteroseptal ischemia cannot be ruled out, without underlying injury pattern  Echocardiogram: No results found for this or any previous visit from the past 1095 days.    RADIOLOGY: CT of the chest with contrast January 01, 2023 Left perihilar lingular mass again seen, similar in size compared to he prior examination. The adjacent parenchymal opacities in the lingula and left lower lobe are increasing. There is increasing small left effusion.   Increasing mild patchy ground-glass opacity in the dependent right lung with some increasing interstitial change   Increasing mediastinal edema and pericardial effusion.   Persistent wall thickening along the esophagus. Please correlate for esophagitis.   No developing new lymph node enlargement or other mass lesion.   Fatty liver infiltration.   Aortic Atherosclerosis (ICD10-I70.0).   ALLERGIES: Allergies  Allergen Reactions   Compazine [Prochlorperazine Edisylate]     Neck hyperextends    MEDICATION LIST PRIOR TO VISIT: Current Meds  Medication Sig   albuterol (PROVENTIL) (2.5 MG/3ML) 0.083% nebulizer solution Take 3 mLs (2.5 mg total) by nebulization every 6 (six) hours as needed for wheezing or shortness of breath.   albuterol (VENTOLIN HFA) 108 (90 Base) MCG/ACT inhaler Inhale 2 puffs into the lungs every 6 (six) hours as needed for wheezing or shortness of breath.   ALPRAZolam (XANAX) 0.5 MG tablet Take 1 tablet (0.5 mg total) by mouth at bedtime as needed for anxiety or sleep.   docusate sodium (COLACE) 100 MG capsule Take 100 mg by mouth every other day.   HYDROcodone bit-homatropine (HYCODAN) 5-1.5 MG/5ML syrup Take 5 mLs by  mouth every 6 (six) hours as needed for cough.   predniSONE (DELTASONE) 10 MG tablet Please take 5 tablets (50 mg) by mouth daily for 1 week, then take 4 tablets (40 mg) daily for 1 week, followed by 3 tablets (30 mg) daily for 1 week, followed by  2 tablets (20 mg) daily for 1 week, followed 1 tablet (10 mg) daily for 1 week, then stop     PAST MEDICAL HISTORY: Past Medical History:  Diagnosis Date   Depression    Insomnia    Lung cancer (HCC) 07/25/2022   Osteoporosis    Pulmonary nodule    Thyroid nodule     PAST SURGICAL HISTORY: Past Surgical History:  Procedure Laterality Date   ANKLE SURGERY Right    BREAST BIOPSY Right    benign 2013   BRONCHIAL BIOPSY  08/04/2022   Procedure: BRONCHIAL BIOPSIES;  Surgeon: Leslye Peer, MD;  Location: [facility] ENDOSCOPY;  Service: Cardiopulmonary;;   BRONCHIAL BRUSHINGS  08/04/2022   Procedure: BRONCHIAL BRUSHINGS;  Surgeon: Leslye Peer, MD;  Location: [facility] ENDOSCOPY;  Service: Cardiopulmonary;;   HEMOSTASIS CONTROL  08/04/2022   Procedure: HEMOSTASIS CONTROL;  Surgeon: Leslye Peer, MD;  Location: [facility] ENDOSCOPY;  Service: Cardiopulmonary;;   IR IMAGING GUIDED PORT INSERTION  09/04/2022   repair of a torn meniscus Left 05/2018   VIDEO BRONCHOSCOPY Left 08/04/2022   Procedure: VIDEO BRONCHOSCOPY WITH FLUORO;  Surgeon: Leslye Peer, MD;  Location: [facility] ENDOSCOPY;  Service: Cardiopulmonary;  Laterality: Left;    FAMILY HISTORY: The patient family history includes Alzheimer's disease in her mother; Hyperlipidemia in her brother; Hypertension in her father; Stroke in her father.  SOCIAL HISTORY:  The patient  reports that she quit smoking about 12 years ago. Her smoking use included cigarettes. She started smoking about 53 years ago. She has a 41 pack-year smoking history. She has never used smokeless tobacco. She reports current alcohol use. She reports that she does not use drugs.  REVIEW OF SYSTEMS: Review of Systems  Cardiovascular:  Positive for dyspnea on exertion. Negative for chest pain, claudication, irregular heartbeat, leg swelling, near-syncope, orthopnea, palpitations, paroxysmal nocturnal dyspnea and syncope.  Respiratory:  Negative for shortness of breath.    Hematologic/Lymphatic: Negative for bleeding problem.  Musculoskeletal:  Negative for muscle cramps and myalgias.  Neurological:  Negative for dizziness and light-headedness.   PHYSICAL EXAM:    01/29/2023    2:46 PM 01/22/2023    2:59 PM 01/15/2023   10:39 AM  Vitals with BMI  Height  5\' 3"    Weight  115 lbs 10 oz 118 lbs 2 oz  BMI  20.48   Systolic 121 104 782  Diastolic 63 62 68  Pulse 75 77 84    Physical Exam  Constitutional: No distress.  Age appropriate, hemodynamically stable.   Neck: No JVD present.  Cardiovascular: Normal rate, regular rhythm, S1 normal, S2 normal, intact distal pulses and normal pulses. Exam reveals no gallop, no S3 and no S4.  No murmur heard. Pulmonary/Chest: Effort normal and breath sounds normal. No stridor. She has no rales. Expiratory wheezes left lung from apex to base  Abdominal: Soft. Bowel sounds are normal. She exhibits no distension. There is no abdominal tenderness.  Musculoskeletal:        General: No edema.     Cervical back: Neck supple.  Neurological: She is alert and oriented to person, place, and time. She has intact cranial nerves (2-12).  Skin: Skin is warm and  moist.    LABORATORY DATA:    Latest Ref Rng & Units 01/15/2023   10:23 AM 12/10/2022    1:31 PM 11/13/2022    2:03 PM  CBC  WBC 4.0 - 10.5 K/uL 4.2  5.7  5.8   Hemoglobin 12.0 - 15.0 g/dL 52.7  78.2  42.3   Hematocrit 36.0 - 46.0 % 34.0  38.3  35.0   Platelets 150 - 400 K/uL 246  191  188        Latest Ref Rng & Units 01/15/2023   10:23 AM 12/10/2022    1:31 PM 11/13/2022    2:03 PM  CMP  Glucose 70 - 99 mg/dL 87  90  536   BUN 8 - 23 mg/dL 15  19  11    Creatinine 0.44 - 1.00 mg/dL 1.44  3.15  4.00   Sodium 135 - 145 mmol/L 140  140  140   Potassium 3.5 - 5.1 mmol/L 4.2  3.9  4.1   Chloride 98 - 111 mmol/L 109  113  107   CO2 22 - 32 mmol/L 26  21  29    Calcium 8.9 - 10.3 mg/dL 8.6  9.2  9.1   Total Protein 6.5 - 8.1 g/dL 6.1  6.2  6.5   Total Bilirubin 0.3 - 1.2  mg/dL 0.3  0.3  0.3   Alkaline Phos 38 - 126 U/L 61  63  66   AST 15 - 41 U/L 13  16  20    ALT 0 - 44 U/L 12  11  22      Lab Results  Component Value Date   CHOL 211 (H) 06/14/2020   HDL 79.10 06/14/2020   LDLCALC 116 (H) 06/14/2020   TRIG 77.0 06/14/2020   CHOLHDL 3 06/14/2020   No components found for: "NTPROBNP" No results for input(s): "PROBNP" in the last 8760 hours. Recent Labs    11/13/22 1403 01/15/23 1023  TSH 1.336 0.013*    BMP Recent Labs    11/13/22 1403 12/10/22 1331 01/15/23 1023  NA 140 140 140  K 4.1 3.9 4.2  CL 107 113* 109  CO2 29 21* 26  GLUCOSE 152* 90 87  BUN 11 19 15   CREATININE 0.79 0.76 0.62  CALCIUM 9.1 9.2 8.6*  GFRNONAA >60 >60 >60    HEMOGLOBIN A1C No results found for: "HGBA1C", "MPG"  IMPRESSION:    ICD-10-CM   1. Pericardial effusion  I31.39 EKG 12-Lead    ECHOCARDIOGRAM COMPLETE    2. Abnormal CT of the chest  R93.89     3. Lung Cancer of left  bronchus or lung (HCC)  C34.90        RECOMMENDATIONS: Evelyn Jordan is a 72 y.o. Caucasian female whose past medical history and cardiac risk factors include: Squamous cell lung cancer of the left lingular bronchus treated with chemoradiation, former smoker, aortic atherosclerosis, pericardial effusion (CT scan July 2024).  Pericardial effusion Abnormal CT of the chest Incidentally noted on CT of the chest from January 01, 2023. Recently was started on immunotherapy for lung cancer and and has had inflammation on the current chest CT.  One of the findings also included pericardial effusion. Personally reviewed the CT of the chest which notes the pericardial effusion to be anterior. Clinically she is not hypotensive, tachycardic, no JVP or pericardial rub on physical examination, no near-syncope or syncopal events.  Recently started on tapered dose of steroids which has helped her noncardiac symptoms of wheezing and  and cough.  Shortness of breath is also slowly  improving. Educated the patient that pericardial effusions are at times overestimated on CT study and therefore the gold standard is echocardiography. Since the current pericardial effusion noted on CT is likely progressing by inflammation shared decision was to proceed forward with an echocardiogram when she is done with her taper dose of steroids. Furthermore she has remained afebrile.  EKG shows sinus rhythm with no obvious evidence of pericarditis or pulsus alternans. Subjectively denies pleuritic or positional chest pain.  Overall pretest probability for cardiac tamponade is low at this time.  However if she develops worsening shortness of breath, swelling, lightheadedness/dizziness, hypotension, tachycardia/fast heart rate, chest pain she is asked to go to the closest ER via EMS for further evaluation.  She verbalizes understanding.  Lung Cancer of left  bronchus or lung (HCC) Follows with medical oncology and pulmonary medicine. On physical examination has diffuse expiratory wheezing over the left hemithorax.  I would like to see her back once the echocardiography is done and further recommendations to follow.  I will have the echocardiogram done at [facility] so that other providers and her care team have access to information and images.  Data Reviewed: I have independently reviewed external notes provided by the referring provider as part of this office visit.   I have independently reviewed results of EKG, labs, CT imaging as part of medical decision making. I have ordered the following tests:  Orders Placed This Encounter  Procedures   EKG 12-Lead   ECHOCARDIOGRAM COMPLETE    Pericardial effusion, evaluate for tamponade physiology, strain imaging (lung cancer patient)    Standing Status:   Future    Standing Expiration Date:   06/19/2023    Scheduling Instructions:     Pericardial effusion, evaluate for tamponade physiology, strain imaging (lung cancer patient)    Order Specific  Question:   Where should this test be performed    Answer:   [place]    Order Specific Question:   Perflutren DEFINITY (image enhancing agent) should be administered unless hypersensitivity or allergy exist    Answer:   Administer Perflutren    Order Specific Question:   Reason for exam-Echo    Answer:   Pericardial effusion I31.3    Order Specific Question:   Other Comments    Answer:   Pericardial effusion, evaluate for tamponade physiology, strain imaging (lung cancer patient)   I have not made medications changes at today's encounter as noted above.   FINAL MEDICATION LIST END OF ENCOUNTER: No orders of the defined types were placed in this encounter.   Medications Discontinued During This Encounter  Medication Reason   lidocaine-prilocaine (EMLA) cream      Current Outpatient Medications:    albuterol (PROVENTIL) (2.5 MG/3ML) 0.083% nebulizer solution, Take 3 mLs (2.5 mg total) by nebulization every 6 (six) hours as needed for wheezing or shortness of breath., Disp: 75 mL, Rfl: 12   albuterol (VENTOLIN HFA) 108 (90 Base) MCG/ACT inhaler, Inhale 2 puffs into the lungs every 6 (six) hours as needed for wheezing or shortness of breath., Disp: 8 g, Rfl: 6   ALPRAZolam (XANAX) 0.5 MG tablet, Take 1 tablet (0.5 mg total) by mouth at bedtime as needed for anxiety or sleep., Disp: 30 tablet, Rfl: 5   docusate sodium (COLACE) 100 MG capsule, Take 100 mg by mouth every other day., Disp: , Rfl:    HYDROcodone bit-homatropine (HYCODAN) 5-1.5 MG/5ML syrup, Take 5 mLs by mouth  every 6 (six) hours as needed for cough., Disp: 120 mL, Rfl: 0   predniSONE (DELTASONE) 10 MG tablet, Please take 5 tablets (50 mg) by mouth daily for 1 week, then take 4 tablets (40 mg) daily for 1 week, followed by 3 tablets (30 mg) daily for 1 week, followed by 2 tablets (20 mg) daily for 1 week, followed 1 tablet (10 mg) daily for 1 week, then stop, Disp: 105 tablet, Rfl: 0  Orders Placed This Encounter  Procedures    EKG 12-Lead   ECHOCARDIOGRAM COMPLETE    There are no Patient Instructions on file for this visit.   --Continue cardiac medications as reconciled in final medication list. --Return in about 4 weeks (around 02/26/2023) for Follow up pericardial effusion, status post echocardiogram. or sooner if needed. --Continue follow-up with your primary care physician regarding the management of your other chronic comorbid conditions.  Patient's questions and concerns were addressed to her satisfaction. She voices understanding of the instructions provided during this encounter.   This note was created using a voice recognition software as a result there may be grammatical errors inadvertently enclosed that do not reflect the nature of this encounter. Every attempt is made to correct such errors.  Tessa Lerner, [place], [facility]  Pager:  [phone] Office: [phone]

## 2023-02-02 ENCOUNTER — Other Ambulatory Visit (HOSPITAL_BASED_OUTPATIENT_CLINIC_OR_DEPARTMENT_OTHER): Payer: Self-pay

## 2023-02-02 ENCOUNTER — Encounter: Payer: Self-pay | Admitting: Internal Medicine

## 2023-02-02 DIAGNOSIS — C3490 Malignant neoplasm of unspecified part of unspecified bronchus or lung: Secondary | ICD-10-CM

## 2023-02-02 MED ORDER — PREDNISONE 10 MG PO TABS: ORAL_TABLET | ORAL | 0 refills | Status: DC

## 2023-02-02 MED ORDER — PREDNISONE 10 MG PO TABS
ORAL_TABLET | ORAL | 0 refills | Status: DC
Start: 2023-02-02 — End: 2023-02-02

## 2023-02-05 ENCOUNTER — Encounter: Payer: Self-pay | Admitting: Internal Medicine

## 2023-02-10 ENCOUNTER — Encounter: Payer: Self-pay | Admitting: Internal Medicine

## 2023-02-11 ENCOUNTER — Other Ambulatory Visit: Payer: Medicare Other

## 2023-02-11 ENCOUNTER — Ambulatory Visit: Payer: Medicare Other

## 2023-02-11 ENCOUNTER — Ambulatory Visit: Payer: Medicare Other | Admitting: Physician Assistant

## 2023-02-11 DIAGNOSIS — Z23 Encounter for immunization: Secondary | ICD-10-CM | POA: Diagnosis not present

## 2023-02-15 ENCOUNTER — Encounter: Payer: Self-pay | Admitting: Family Medicine

## 2023-02-16 NOTE — Telephone Encounter (Signed)
Noted  

## 2023-02-16 NOTE — Telephone Encounter (Signed)
Patient has been scheduled for 9/27 @ 11:30 am and placed on wait list.

## 2023-02-27 ENCOUNTER — Encounter: Payer: Self-pay | Admitting: Internal Medicine

## 2023-02-27 ENCOUNTER — Encounter: Payer: Self-pay | Admitting: Family Medicine

## 2023-02-27 ENCOUNTER — Ambulatory Visit (INDEPENDENT_AMBULATORY_CARE_PROVIDER_SITE_OTHER): Payer: Medicare Other | Admitting: Family Medicine

## 2023-02-27 VITALS — BP 136/88 | HR 84 | Temp 97.8°F | Ht 63.0 in | Wt 118.6 lb

## 2023-02-27 DIAGNOSIS — Z635 Disruption of family by separation and divorce: Secondary | ICD-10-CM

## 2023-02-27 DIAGNOSIS — C3490 Malignant neoplasm of unspecified part of unspecified bronchus or lung: Secondary | ICD-10-CM

## 2023-02-27 DIAGNOSIS — F5102 Adjustment insomnia: Secondary | ICD-10-CM | POA: Diagnosis not present

## 2023-02-27 MED ORDER — ESZOPICLONE 2 MG PO TABS: 2.0000 mg | ORAL_TABLET | Freq: Every evening | ORAL | 5 refills | Status: DC | PRN

## 2023-02-27 NOTE — Progress Notes (Unsigned)
Subjective  CC:  Chief Complaint  Patient presents with   Depression   Insomnia    Pt stated that she needs a refill on Xanax but she may need an increase bc she is constantly waking up    HPI: Evelyn Jordan is a 72 y.o. female who presents to the office today to address the problems listed above in the chief complaint. 72 yo with lung cancer undergoing treatments and doing well overall; tolerating things and continues to keep active; hiking and spending time with family. However, still with problems sleeping. Initially started xanax low dose to help adjust to her dx/treatments etc, however continues to use nightly to help her sleep. Works to get her asleep but then awakens. Not really worrying but more thinking about what she has to do or will do the next day etc. Says her mood is good. No more significant anxiety related to the illness; she is hopeful to continue to do well. However added stress because husband left her. She is handling this well and feels ok about it; feels more free. However understands this is another major life adjustment.    Assessment  1. Adjustment insomnia   2. Malignant neoplasm of unspecified part of unspecified bronchus or lung (HCC)   3. Separated from spouse      Plan  Adjustment insomnia:  discussed sleep medications. Has failed trazodone in past. Ambien helped had been used but doesn't last long enough. Trial of lunesta Mood: amazingly good! Will monitor. No need for mood mgt at this time.  Lung cancer per onc  Follow up: 3-6 months Visit date not found  No orders of the defined types were placed in this encounter.  Meds ordered this encounter  Medications   eszopiclone (LUNESTA) 2 MG TABS tablet    Sig: Take 1 tablet (2 mg total) by mouth at bedtime as needed for sleep. Take immediately before bedtime    Dispense:  30 tablet    Refill:  5      I reviewed the patients updated PMH, FH, and SocHx.    Patient Active Problem List   Diagnosis  Date Noted   Tobacco use 01/22/2023   Dyspnea 12/18/2022   Port-A-Cath in place 09/08/2022   Encounter for antineoplastic chemotherapy 08/25/2022   Malignant neoplasm of unspecified part of unspecified bronchus or lung (HCC) 08/11/2022   Abnormal CT of the chest 07/29/2022   Depression, major, single episode, moderate (HCC) 04/22/2021   Pulmonary nodules 04/14/2019   Thyroid nodule 04/14/2019   Synovial cyst of popliteal space 04/05/2018   Osteoporosis 12/23/2017   Primary insomnia 12/04/2017   Current Meds  Medication Sig   albuterol (PROVENTIL) (2.5 MG/3ML) 0.083% nebulizer solution Take 3 mLs (2.5 mg total) by nebulization every 6 (six) hours as needed for wheezing or shortness of breath.   albuterol (VENTOLIN HFA) 108 (90 Base) MCG/ACT inhaler Inhale 2 puffs into the lungs every 6 (six) hours as needed for wheezing or shortness of breath.   ALPRAZolam (XANAX) 0.5 MG tablet Take 1 tablet (0.5 mg total) by mouth at bedtime as needed for anxiety or sleep.   docusate sodium (COLACE) 100 MG capsule Take 100 mg by mouth every other day.   eszopiclone (LUNESTA) 2 MG TABS tablet Take 1 tablet (2 mg total) by mouth at bedtime as needed for sleep. Take immediately before bedtime   HYDROcodone bit-homatropine (HYCODAN) 5-1.5 MG/5ML syrup Take 5 mLs by mouth every 6 (six) hours as needed for cough.  Allergies: Patient is allergic to compazine [prochlorperazine edisylate]. Family History: Patient family history includes Alzheimer's disease in her mother; Hyperlipidemia in her brother; Hypertension in her father; Stroke in her father. Social History:  Patient  reports that she quit smoking about 12 years ago. Her smoking use included cigarettes. She started smoking about 53 years ago. She has a 41 pack-year smoking history. She has never used smokeless tobacco. She reports current alcohol use. She reports that she does not use drugs.  Review of Systems: Constitutional: Negative for fever  malaise or anorexia Cardiovascular: negative for chest pain Respiratory: negative for SOB or persistent cough Gastrointestinal: negative for abdominal pain  Objective  Vitals: BP 136/88   Pulse 84   Temp 97.8 F (36.6 C)   Ht 5\' 3"  (1.6 m)   Wt 118 lb 9.6 oz (53.8 kg)   SpO2 99%   BMI 21.01 kg/m  General: no acute distress , A&Ox3 Looks great Psych: normal affect Commons side effects, risks, benefits, and alternatives for medications and treatment plan prescribed today were discussed, and the patient expressed understanding of the given instructions. Patient is instructed to call or message via MyChart if he/she has any questions or concerns regarding our treatment plan. No barriers to understanding were identified. We discussed Red Flag symptoms and signs in detail. Patient expressed understanding regarding what to do in case of urgent or emergency type symptoms.  Medication list was reconciled, printed and provided to the patient in AVS. Patient instructions and summary information was reviewed with the patient as documented in the AVS. This note was prepared with assistance of Dragon voice recognition software. Occasional wrong-word or sound-a-like substitutions may have occurred due to the inherent limitations of voice recognition software

## 2023-03-02 DIAGNOSIS — Z23 Encounter for immunization: Secondary | ICD-10-CM | POA: Diagnosis not present

## 2023-03-04 ENCOUNTER — Encounter: Payer: Self-pay | Admitting: Internal Medicine

## 2023-03-04 ENCOUNTER — Other Ambulatory Visit: Payer: Self-pay | Admitting: Medical Oncology

## 2023-03-04 DIAGNOSIS — C3490 Malignant neoplasm of unspecified part of unspecified bronchus or lung: Secondary | ICD-10-CM

## 2023-03-05 ENCOUNTER — Ambulatory Visit (HOSPITAL_BASED_OUTPATIENT_CLINIC_OR_DEPARTMENT_OTHER)
Admission: RE | Admit: 2023-03-05 | Discharge: 2023-03-05 | Disposition: A | Discharge status: Home / Self Care | Payer: Medicare Other | Source: Ambulatory Visit | Attending: Cardiology | Admitting: Cardiology

## 2023-03-05 ENCOUNTER — Inpatient Hospital Stay: Payer: Medicare Other | Attending: Internal Medicine

## 2023-03-05 ENCOUNTER — Other Ambulatory Visit: Payer: Medicare Other

## 2023-03-05 ENCOUNTER — Encounter: Payer: Self-pay | Admitting: Internal Medicine

## 2023-03-05 ENCOUNTER — Other Ambulatory Visit: Payer: Self-pay | Admitting: Medical Oncology

## 2023-03-05 ENCOUNTER — Ambulatory Visit (HOSPITAL_COMMUNITY)
Admission: RE | Admit: 2023-03-05 | Discharge: 2023-03-05 | Disposition: A | Discharge status: Home / Self Care | Payer: Medicare Other | Source: Ambulatory Visit | Attending: Physician Assistant | Admitting: Physician Assistant

## 2023-03-05 DIAGNOSIS — I700 Atherosclerosis of aorta: Secondary | ICD-10-CM | POA: Diagnosis not present

## 2023-03-05 DIAGNOSIS — Z8249 Family history of ischemic heart disease and other diseases of the circulatory system: Secondary | ICD-10-CM | POA: Insufficient documentation

## 2023-03-05 DIAGNOSIS — Z923 Personal history of irradiation: Secondary | ICD-10-CM | POA: Insufficient documentation

## 2023-03-05 DIAGNOSIS — C3490 Malignant neoplasm of unspecified part of unspecified bronchus or lung: Secondary | ICD-10-CM | POA: Insufficient documentation

## 2023-03-05 DIAGNOSIS — R0609 Other forms of dyspnea: Secondary | ICD-10-CM | POA: Insufficient documentation

## 2023-03-05 DIAGNOSIS — I3139 Other pericardial effusion (noninflammatory): Secondary | ICD-10-CM

## 2023-03-05 DIAGNOSIS — E039 Hypothyroidism, unspecified: Secondary | ICD-10-CM

## 2023-03-05 DIAGNOSIS — I2510 Atherosclerotic heart disease of native coronary artery without angina pectoris: Secondary | ICD-10-CM | POA: Insufficient documentation

## 2023-03-05 DIAGNOSIS — Z85118 Personal history of other malignant neoplasm of bronchus and lung: Secondary | ICD-10-CM | POA: Insufficient documentation

## 2023-03-05 DIAGNOSIS — Z2989 Encounter for other specified prophylactic measures: Secondary | ICD-10-CM

## 2023-03-05 DIAGNOSIS — C3412 Malignant neoplasm of upper lobe, left bronchus or lung: Secondary | ICD-10-CM | POA: Insufficient documentation

## 2023-03-05 DIAGNOSIS — Z87891 Personal history of nicotine dependence: Secondary | ICD-10-CM | POA: Insufficient documentation

## 2023-03-05 HISTORY — DX: Other pericardial effusion (noninflammatory): I31.39

## 2023-03-05 LAB — ECHOCARDIOGRAM COMPLETE
AR max vel: 1.63 cm2
AV Area VTI: 1.82 cm2
AV Area mean vel: 1.82 cm2
AV Mean grad: 4 mmHg
AV Peak grad: 7.6 mmHg
Ao pk vel: 1.38 m/s
Area-P 1/2: 5.54 cm2
S' Lateral: 2.5 cm

## 2023-03-05 LAB — CBC WITH DIFFERENTIAL (CANCER CENTER ONLY)
Abs Immature Granulocytes: 0.03 10*3/uL (ref 0.00–0.07)
Basophils Absolute: 0 10*3/uL (ref 0.0–0.1)
Basophils Relative: 1 %
Eosinophils Absolute: 0.1 10*3/uL (ref 0.0–0.5)
Eosinophils Relative: 2 %
HCT: 37.5 % (ref 36.0–46.0)
Hemoglobin: 12.3 g/dL (ref 12.0–15.0)
Immature Granulocytes: 1 %
Lymphocytes Relative: 4 %
Lymphs Abs: 0.3 10*3/uL — ABNORMAL LOW (ref 0.7–4.0)
MCH: 30.6 pg (ref 26.0–34.0)
MCHC: 32.8 g/dL (ref 30.0–36.0)
MCV: 93.3 fL (ref 80.0–100.0)
Monocytes Absolute: 0.8 10*3/uL (ref 0.1–1.0)
Monocytes Relative: 12 %
Neutro Abs: 5.4 10*3/uL (ref 1.7–7.7)
Neutrophils Relative %: 80 %
Platelet Count: 233 10*3/uL (ref 150–400)
RBC: 4.02 MIL/uL (ref 3.87–5.11)
RDW: 13.7 % (ref 11.5–15.5)
WBC Count: 6.6 10*3/uL (ref 4.0–10.5)
nRBC: 0 % (ref 0.0–0.2)

## 2023-03-05 LAB — CMP (CANCER CENTER ONLY)
ALT: 14 U/L (ref 0–44)
AST: 17 U/L (ref 15–41)
Albumin: 3.5 g/dL (ref 3.5–5.0)
Alkaline Phosphatase: 60 U/L (ref 38–126)
Anion gap: 4 — ABNORMAL LOW (ref 5–15)
BUN: 15 mg/dL (ref 8–23)
CO2: 28 mmol/L (ref 22–32)
Calcium: 8.8 mg/dL — ABNORMAL LOW (ref 8.9–10.3)
Chloride: 107 mmol/L (ref 98–111)
Creatinine: 0.77 mg/dL (ref 0.44–1.00)
GFR, Estimated: >60 mL/min (ref >60)
Glucose, Bld: 89 mg/dL (ref 70–99)
Potassium: 3.9 mmol/L (ref 3.5–5.1)
Sodium: 139 mmol/L (ref 135–145)
Total Bilirubin: 0.5 mg/dL (ref 0.3–1.2)
Total Protein: 6.5 g/dL (ref 6.5–8.1)

## 2023-03-05 MED ORDER — IOHEXOL 300 MG/ML  SOLN
75.0000 mL | Freq: Once | INTRAMUSCULAR | Status: AC | PRN
  Administered 2023-03-05: 75 mL via INTRAVENOUS

## 2023-03-05 MED ORDER — HEPARIN SOD (PORK) LOCK FLUSH 100 UNIT/ML IV SOLN
500.0000 [IU] | Freq: Once | INTRAVENOUS | Status: AC
  Administered 2023-03-05: 500 [IU] via INTRAVENOUS

## 2023-03-05 MED ORDER — SODIUM CHLORIDE (PF) 0.9 % IJ SOLN
INTRAMUSCULAR | Status: AC
  Filled 2023-03-05: qty 50

## 2023-03-05 MED ORDER — HEPARIN SOD (PORK) LOCK FLUSH 100 UNIT/ML IV SOLN
INTRAVENOUS | Status: AC
  Filled 2023-03-05: qty 5

## 2023-03-06 ENCOUNTER — Ambulatory Visit: Payer: Medicare Other | Admitting: Family Medicine

## 2023-03-11 ENCOUNTER — Other Ambulatory Visit: Payer: Medicare Other

## 2023-03-11 ENCOUNTER — Ambulatory Visit: Payer: Medicare Other

## 2023-03-11 ENCOUNTER — Ambulatory Visit: Payer: Medicare Other | Admitting: Internal Medicine

## 2023-03-11 ENCOUNTER — Inpatient Hospital Stay: Payer: Medicare Other | Attending: Internal Medicine | Admitting: Internal Medicine

## 2023-03-11 ENCOUNTER — Inpatient Hospital Stay: Payer: Medicare Other

## 2023-03-11 VITALS — BP 131/89 | HR 77 | Temp 98.1°F | Resp 15 | Ht 63.0 in | Wt 119.7 lb

## 2023-03-11 DIAGNOSIS — E039 Hypothyroidism, unspecified: Secondary | ICD-10-CM

## 2023-03-11 DIAGNOSIS — Z79899 Other long term (current) drug therapy: Secondary | ICD-10-CM | POA: Insufficient documentation

## 2023-03-11 DIAGNOSIS — C3490 Malignant neoplasm of unspecified part of unspecified bronchus or lung: Secondary | ICD-10-CM | POA: Diagnosis not present

## 2023-03-11 DIAGNOSIS — E0590 Thyrotoxicosis, unspecified without thyrotoxic crisis or storm: Secondary | ICD-10-CM | POA: Diagnosis not present

## 2023-03-11 DIAGNOSIS — E041 Nontoxic single thyroid nodule: Secondary | ICD-10-CM

## 2023-03-11 DIAGNOSIS — C3412 Malignant neoplasm of upper lobe, left bronchus or lung: Secondary | ICD-10-CM | POA: Diagnosis not present

## 2023-03-11 DIAGNOSIS — Z923 Personal history of irradiation: Secondary | ICD-10-CM | POA: Insufficient documentation

## 2023-03-11 DIAGNOSIS — Z9221 Personal history of antineoplastic chemotherapy: Secondary | ICD-10-CM | POA: Diagnosis not present

## 2023-03-11 LAB — TSH: TSH: 31.574 u[IU]/mL — ABNORMAL HIGH (ref 0.350–4.500)

## 2023-03-11 NOTE — Progress Notes (Signed)
[facility] [phone]   [phone]  OFFICE PROGRESS NOTE  [name], MD [address] [place] [address]  DIAGNOSIS: Stage IIIB (T3, N2, M0) non-small cell lung cancer, squamous cell carcinoma presented with obstructive left lingular mass with consolidation and collapse of the lingula as well as part of the left upper lobe and likely invasion of the mediastinum diagnosed in February 2024.   Biomarker Findings Microsatellite status - MS-Stable Tumor Mutational Burden - 5 Muts/Mb Genomic Findings For a complete list of the genes assayed, please refer to the Appendix. CCND1 amplification FGFR1 amplification MDM2 amplification FGF19 amplification FGF3 amplification FGF4 amplification TP53 Q136* 8 Disease relevant genes with no reportable alterations: ALK, BRAF, EGFR, ERBB2, KRAS, MET, RET, ROS1  PDL1: 5%   PRIOR THERAPY:  1) Concurrent chemoradiation with weekly carboplatin for AUC of 2 and paclitaxel 45 Mg/M2. Status post 7 weeks of treatment.  Last dose was given October 06, 2022 with partial response. 2) Consolidation treatment with immunotherapy with Imfinzi 1500 Mg IV every 4 weeks.  First dose on November 13, 2022.  Status post 2 cycles.  This was discontinued secondary to toxicity and suspicious immunotherapy mediated pneumonitis.   CURRENT THERAPY: Observation.   INTERVAL HISTORY: Evelyn Jordan 72 y.o. female returns to the clinic today for follow-up visit.Discussed the use of AI scribe software for clinical note transcription with the patient, who gave verbal consent to proceed.  History of Present Illness   The patient, a 72 year old diagnosed with stage 3B non-small cell lung cancer in February 2024, underwent chemo-radiation with weekly carboplatin and paclitaxel, followed by two cycles of consolidation treatment with immunotherapy (Imfinzi). However, due to the development of immune therapy pneumonitis and other  complications, the treatment was discontinued. The patient has been under observation for the past two months and completed a course of prednisone taper two weeks ago.  The cough is non-productive, and the patient denies taking any cough medication. There was a single episode of hemoptysis three to four weeks ago, which lasted for about 30 minutes. The patient also reports mild wheezing but maintains a satisfactory energy level.  The patient has also been dealing with hyperthyroidism, a side effect of the immune therapy, characterized by elevated T4 and low TSH levels. The patient recently completed a thyroid function test, but the results are pending.  The patient also reports a history of pericardial effusion, which is currently stable. However, there was a slight decrease in ejection fraction from 60-65% to 55-60%.        MEDICAL HISTORY: Past Medical History:  Diagnosis Date   Depression    Insomnia    Lung cancer (HCC) 07/25/2022   Osteoporosis    Pulmonary nodule    Thyroid nodule     ALLERGIES:  is allergic to compazine [prochlorperazine edisylate].  MEDICATIONS:  Current Outpatient Medications  Medication Sig Dispense Refill   albuterol (PROVENTIL) (2.5 MG/3ML) 0.083% nebulizer solution Take 3 mLs (2.5 mg total) by nebulization every 6 (six) hours as needed for wheezing or shortness of breath. 75 mL 12   albuterol (VENTOLIN HFA) 108 (90 Base) MCG/ACT inhaler Inhale 2 puffs into the lungs every 6 (six) hours as needed for wheezing or shortness of breath. 8 g 6   ALPRAZolam (XANAX) 0.5 MG tablet Take 1 tablet (0.5 mg total) by mouth at bedtime as needed for anxiety or sleep. 30 tablet 5   docusate sodium (COLACE) 100 MG capsule Take  100 mg by mouth every other day.     eszopiclone (LUNESTA) 2 MG TABS tablet Take 1 tablet (2 mg total) by mouth at bedtime as needed for sleep. Take immediately before bedtime 30 tablet 5   HYDROcodone bit-homatropine (HYCODAN) 5-1.5 MG/5ML syrup  Take 5 mLs by mouth every 6 (six) hours as needed for cough. 120 mL 0   No current facility-administered medications for this visit.    SURGICAL HISTORY:  Past Surgical History:  Procedure Laterality Date   ANKLE SURGERY Right    BREAST BIOPSY Right    benign 2013   BRONCHIAL BIOPSY  08/04/2022   Procedure: BRONCHIAL BIOPSIES;  Surgeon: Leslye Peer, MD;  Location: [facility] ENDOSCOPY;  Service: Cardiopulmonary;;   BRONCHIAL BRUSHINGS  08/04/2022   Procedure: BRONCHIAL BRUSHINGS;  Surgeon: Leslye Peer, MD;  Location: [facility] ENDOSCOPY;  Service: Cardiopulmonary;;   HEMOSTASIS CONTROL  08/04/2022   Procedure: HEMOSTASIS CONTROL;  Surgeon: Leslye Peer, MD;  Location: [facility] ENDOSCOPY;  Service: Cardiopulmonary;;   IR IMAGING GUIDED PORT INSERTION  09/04/2022   repair of a torn meniscus Left 05/2018   VIDEO BRONCHOSCOPY Left 08/04/2022   Procedure: VIDEO BRONCHOSCOPY WITH FLUORO;  Surgeon: Leslye Peer, MD;  Location: [facility] ENDOSCOPY;  Service: Cardiopulmonary;  Laterality: Left;    REVIEW OF SYSTEMS:  Constitutional: positive for fatigue Eyes: negative Ears, nose, mouth, throat, and face: negative Respiratory: positive for cough and wheezing Cardiovascular: negative Gastrointestinal: negative Genitourinary:negative Integument/breast: negative Hematologic/lymphatic: negative Musculoskeletal:negative Neurological: negative Behavioral/Psych: negative Endocrine: negative Allergic/Immunologic: negative   PHYSICAL EXAMINATION: General appearance: alert, cooperative, fatigued, and no distress Head: Normocephalic, without obvious abnormality, atraumatic Neck: no adenopathy, no JVD, supple, symmetrical, trachea midline, and thyroid not enlarged, symmetric, no tenderness/mass/nodules Lymph nodes: Cervical, supraclavicular, and axillary nodes normal. Resp: clear to auscultation bilaterally Back: symmetric, no curvature. ROM normal. No CVA tenderness. Cardio: regular rate and rhythm, S1, S2  normal, no murmur, click, rub or gallop GI: soft, non-tender; bowel sounds normal; no masses,  no organomegaly Extremities: extremities normal, atraumatic, no cyanosis or edema Neurologic: Alert and oriented X 3, normal strength and tone. Normal symmetric reflexes. Normal coordination and gait  ECOG PERFORMANCE STATUS: 1 - Symptomatic but completely ambulatory  Blood pressure 131/89, pulse 77, temperature 98.1 F (36.7 C), temperature source Oral, resp. rate 15, height 5\' 3"  (1.6 m), weight 119 lb 11.2 oz (54.3 kg), SpO2 99%.  LABORATORY DATA: Lab Results  Component Value Date   WBC 6.6 03/05/2023   HGB 12.3 03/05/2023   HCT 37.5 03/05/2023   MCV 93.3 03/05/2023   PLT 233 03/05/2023      Chemistry      Component Value Date/Time   NA 139 03/05/2023 0941   NA 141 03/07/2015 0000   K 3.9 03/05/2023 0941   CL 107 03/05/2023 0941   CO2 28 03/05/2023 0941   BUN 15 03/05/2023 0941   CREATININE 0.77 03/05/2023 0941   GLU 103 03/07/2015 0000      Component Value Date/Time   CALCIUM 8.8 (L) 03/05/2023 0941   ALKPHOS 60 03/05/2023 0941   AST 17 03/05/2023 0941   ALT 14 03/05/2023 0941   BILITOT 0.5 03/05/2023 0941       RADIOGRAPHIC STUDIES: CT Chest W Contrast  Result Date: 03/11/2023 CLINICAL DATA:  Non-small cell lung cancer restaging * Tracking Code: BO * EXAM: CT CHEST WITH CONTRAST TECHNIQUE: Multidetector CT imaging of the chest was performed during intravenous contrast administration. RADIATION DOSE REDUCTION: This exam was  performed according to the departmental dose-optimization program which includes automated exposure control, adjustment of the mA and/or kV according to patient size and/or use of iterative reconstruction technique. CONTRAST:  75mL OMNIPAQUE IOHEXOL 300 MG/ML  SOLN COMPARISON:  01/01/2023 FINDINGS: Cardiovascular: Right Port-A-Cath tip: Right atrium. Coronary, aortic arch, and branch vessel atherosclerotic vascular disease. Stable small to moderate  pericardial effusion. Mediastinum/Nodes: No pathologic adenopathy. Lungs/Pleura: Calcified granulomas in the right lower lobe. 4 by 3 mm right lower lobe nodule on image 74 of series 6 is stable from 04/06/2019 and considered benign. In the vicinity of the lingular mass we demonstrate volume loss compatible with radiation therapy. The original mass is obscured with hypodensity in this vicinity measuring about 1.9 by 2.1 cm on image 68 series 2, formerly 2.9 by 2.3 cm. There is also consolidation in the superior segment left lower lobe likely from local radiation therapy. The previous left pleural effusion has resolved. Upper Abdomen: Contracted gallbladder. Nonspecific 1.0 by 0.6 cm enhancing lesion in the dome of the right hepatic lobe on image 120 series 2, stable from 07/24/2022, area not hypermetabolic on PET-CT from 08/13/2022, hence this is most likely to be a benign hemangioma or similar lesion. Adrenal glands unremarkable. Musculoskeletal: Loss of disc height and spurring at T7-8. IMPRESSION: 1. The original lingular mass is obscured by radiation therapy changes, but the mass appears smaller than on the prior exam. 2. No findings of metastatic disease. 3. Stable small to moderate pericardial effusion. 4. 1 cm enhancing lesion in the dome of the right hepatic lobe is stable from 07/24/2022 and not hypermetabolic on interval PET-CT, favoring a benign lesion such as hemangioma. 5. Aortic and coronary atherosclerosis. Aortic Atherosclerosis (ICD10-I70.0). Electronically Signed   By: Gaylyn Rong M.D.   On: 03/11/2023 11:34   ECHOCARDIOGRAM COMPLETE  Result Date: 03/05/2023    ECHOCARDIOGRAM REPORT   Patient Name:   Evelyn Jordan Date of Exam: 03/05/2023 Medical Rec #:  [id number]       Height:       63.0 in Accession #:    [id number]      Weight:       118.6 lb Date of Birth:  [birth year]       BSA:          1.549 m Patient Age:    72 years        BP:           136/88 mmHg Patient Gender: F                HR:           82 bpm. Exam Location:  Outpatient Procedure: 2D Echo, 3D Echo, Cardiac Doppler, Color Doppler and Strain Analysis Indications:    I31.3 Pericardial effusion (noninflammatory); R06.9 DOE  History:        Patient has prior history of Echocardiogram examinations, most                 recent 04/23/2017. CAD, Signs/Symptoms:Dyspnea; Risk                 Factors:Former Smoker and Family History of Coronary Artery                 Disease. Patient denies chest pain and leg edema. She does have                 DOE with a history of left squamous lung cancer. History of  chemo/radiation therapy.  Sonographer:    Carlos American RVT, RDCS (AE), RDMS Referring Phys: SUNIT TOLIA IMPRESSIONS  1. Left ventricular ejection fraction, by estimation, is 55 to 60%. Left ventricular ejection fraction by 3D volume is 59 %. The left ventricle has normal function. The left ventricle has no regional wall motion abnormalities. Left ventricular diastolic  parameters are consistent with Grade I diastolic dysfunction (impaired relaxation).  2. Right ventricular systolic function is mildly reduced. The right ventricular size is normal. There is normal pulmonary artery systolic pressure. The estimated right ventricular systolic pressure is 15.1 mmHg.  3. A small pericardial effusion is present. The pericardial effusion is circumferential. There is no evidence of cardiac tamponade.  4. The mitral valve is abnormal. Trivial mitral valve regurgitation.  5. The aortic valve is tricuspid. Aortic valve regurgitation is not visualized.  6. The inferior vena cava is normal in size with greater than 50% respiratory variability, suggesting right atrial pressure of 3 mmHg. Comparison(s): 04/23/2017: LVEF 60-65%, no pericardial effusion. CT 01/01/2023 - small pericardial effusion. FINDINGS  Left Ventricle: Left ventricular ejection fraction, by estimation, is 55 to 60%. Left ventricular ejection fraction by 3D volume is 59 %. The  left ventricle has normal function. The left ventricle has no regional wall motion abnormalities. The left ventricular internal cavity size was normal in size. There is no left ventricular hypertrophy. Left ventricular diastolic parameters are consistent with Grade I diastolic dysfunction (impaired relaxation). Indeterminate filling pressures. Right Ventricle: The right ventricular size is normal. No increase in right ventricular wall thickness. Right ventricular systolic function is mildly reduced. There is normal pulmonary artery systolic pressure. The tricuspid regurgitant velocity is 1.74 m/s, and with an assumed right atrial pressure of 3 mmHg, the estimated right ventricular systolic pressure is 15.1 mmHg. Left Atrium: Left atrial size was normal in size. Right Atrium: Right atrial size was normal in size. Pericardium: A small pericardial effusion is present. The pericardial effusion is circumferential. There is diastolic collapse of the right ventricular free wall and diastolic collapse of the right atrial wall. There is no evidence of cardiac tamponade. Mitral Valve: The mitral valve is abnormal. There is mild thickening of the anterior and posterior mitral valve leaflet(s). Trivial mitral valve regurgitation. Tricuspid Valve: The tricuspid valve is grossly normal. Tricuspid valve regurgitation is trivial. Aortic Valve: The aortic valve is tricuspid. Aortic valve regurgitation is not visualized. Aortic valve mean gradient measures 4.0 mmHg. Aortic valve peak gradient measures 7.6 mmHg. Aortic valve area, by VTI measures 1.82 cm. Pulmonic Valve: The pulmonic valve was grossly normal. Pulmonic valve regurgitation is mild. Aorta: The aortic root and ascending aorta are structurally normal, with no evidence of dilitation. Venous: The inferior vena cava is normal in size with greater than 50% respiratory variability, suggesting right atrial pressure of 3 mmHg. IAS/Shunts: The interatrial septum is aneurysmal. No  atrial level shunt detected by color flow Doppler.  LEFT VENTRICLE PLAX 2D LVIDd:         3.60 cm         Diastology LVIDs:         2.50 cm         LV e' medial:    3.92 cm/s LV PW:         0.90 cm         LV E/e' medial:  13.7 LV IVS:        1.00 cm         LV e' lateral:  5.87 cm/s LVOT diam:     1.90 cm         LV E/e' lateral: 9.2 LV SV:         48 LV SV Index:   31 LVOT Area:     2.84 cm        3D Volume EF                                LV 3D EF:    Left                                             ventricul                                             ar                                             ejection                                             fraction                                             by 3D                                             volume is                                             59 %.                                 3D Volume EF:                                3D EF:        59 %                                LV EDV:       75 ml                                LV ESV:       30 ml  LV SV:        44 ml RIGHT VENTRICLE RV S prime:     13.80 cm/s TAPSE (M-mode): 1.9 cm LEFT ATRIUM         Index LA diam:    3.40 cm 2.20 cm/m  AORTIC VALVE                    PULMONIC VALVE AV Area (Vmax):    1.63 cm     PV Vmax:          0.85 m/s AV Area (Vmean):   1.82 cm     PV Peak grad:     2.9 mmHg AV Area (VTI):     1.82 cm     PR End Diast Vel: 7.62 msec AV Vmax:           138.00 cm/s AV Vmean:          88.800 cm/s AV VTI:            0.265 m AV Peak Grad:      7.6 mmHg AV Mean Grad:      4.0 mmHg LVOT Vmax:         79.50 cm/s LVOT Vmean:        57.100 cm/s LVOT VTI:          0.170 m LVOT/AV VTI ratio: 0.64  AORTA Ao Root diam: 3.40 cm Ao Asc diam:  3.40 cm Ao Arch diam: 3.1 cm MITRAL VALVE               TRICUSPID VALVE MV Area (PHT): 5.54 cm    TR Peak grad:   12.1 mmHg MV Decel Time: 137 msec    TR Vmax:        174.00 cm/s MV E velocity: 53.80 cm/s MV A velocity:  73.90 cm/s  SHUNTS MV E/A ratio:  0.73        Systemic VTI:  0.17 m                            Systemic Diam: 1.90 cm Zoila Shutter MD Electronically signed by Zoila Shutter MD Signature Date/Time: 03/05/2023/11:02:05 AM    Final     ASSESSMENT AND PLAN: This is a very pleasant 72 years old white female with Stage IIIB (T3, N2, M0) non-small cell lung cancer, squamous cell carcinoma presented with obstructive left lingular mass with consolidation and collapse of the lingula as well as part of the left upper lobe and likely invasion of the mediastinum diagnosed in February 2024.  Molecular studies by foundation 1 showed no actionable mutations PDL1: 5% The patient underwent a course of concurrent chemoradiation with weekly carboplatin for AUC of 2 and paclitaxel 45 Mg/M2 status post 7  cycles.  Last dose was given on October 06, 2022 with partial response. The patient tolerated the previous course of her treatment fairly well with no concerning adverse effect except for the fatigue and the radiation-induced esophagitis. She had partial response to this treatment. She underwent consolidation treatment with immunotherapy with Imfinzi 1500 Mg IV every 4 weeks status post 2 cycle.  Her treatment was discontinued secondary to toxicity. Assessment and Plan    Stage 3B Non-Small Cell Lung Cancer Diagnosed in February 2024, treated with chemo-radiation and consolidation treatment with Imfinzi. Treatment was stopped due to immune therapy pneumonitis. Recent CT scan shows no evidence of cancer growth and original mass appears smaller. -Continue observation. -Next  follow-up in three months with a CT scan.  Immune Therapy Pneumonitis Developed post-Imfinzi treatment, managed with prednisone taper which was completed two weeks ago. Patient reports increased dry cough since stopping prednisone. -Continue observation.  Hyperthyroidism Likely side effect from immune therapy. Recent lab results showed elevated T4  and low TSH. -Check TSH and T4 levels. -If results are abnormal, refer to endocrinology.  Pericardial Effusion Stable small to moderate pericardial effusion noted on recent CT scan. Patient has no symptoms of shortness of breath. -Continue observation. -If symptoms develop, refer to cardiology for echocardiogram.  Port-a-Cath Maintenance -Flush port-a-cath every six weeks.   The patient was advised to call immediately if she has any other concerning symptoms in the interval. The patient voices understanding of current disease status and treatment options and is in agreement with the current care plan.  All questions were answered. The patient knows to call the clinic with any problems, questions or concerns. We can certainly see the patient much sooner if necessary.  The total time spent in the appointment was 30 minutes.  Disclaimer: This note was dictated with voice recognition software. Similar sounding words can inadvertently be transcribed and may not be corrected upon review.

## 2023-03-12 ENCOUNTER — Encounter: Payer: Self-pay | Admitting: Internal Medicine

## 2023-03-12 ENCOUNTER — Other Ambulatory Visit: Payer: Self-pay | Admitting: Internal Medicine

## 2023-03-12 ENCOUNTER — Encounter: Payer: Self-pay | Admitting: Medical Oncology

## 2023-03-12 LAB — T4: T4, Total: 3.3 ug/dL — ABNORMAL LOW (ref 4.5–12.0)

## 2023-03-12 MED ORDER — LEVOTHYROXINE SODIUM 50 MCG PO TABS: 50.0000 ug | ORAL_TABLET | Freq: Every day | ORAL | 2 refills | Status: DC

## 2023-03-14 ENCOUNTER — Encounter: Payer: Self-pay | Admitting: Internal Medicine

## 2023-03-21 ENCOUNTER — Encounter: Payer: Self-pay | Admitting: Family Medicine

## 2023-03-21 ENCOUNTER — Encounter: Payer: Self-pay | Admitting: Internal Medicine

## 2023-03-23 ENCOUNTER — Ambulatory Visit: Payer: Self-pay | Admitting: Cardiology

## 2023-03-23 DIAGNOSIS — H43812 Vitreous degeneration, left eye: Secondary | ICD-10-CM | POA: Diagnosis not present

## 2023-04-09 ENCOUNTER — Ambulatory Visit: Payer: Medicare Other | Admitting: Emergency Medicine

## 2023-04-09 ENCOUNTER — Encounter: Payer: Self-pay | Admitting: Emergency Medicine

## 2023-04-09 VITALS — BP 122/70 | HR 83 | Temp 98.5°F | Ht 63.0 in | Wt 121.0 lb

## 2023-04-09 DIAGNOSIS — C3490 Malignant neoplasm of unspecified part of unspecified bronchus or lung: Secondary | ICD-10-CM

## 2023-04-09 DIAGNOSIS — R0602 Shortness of breath: Secondary | ICD-10-CM

## 2023-04-09 NOTE — Assessment & Plan Note (Signed)
Some of her symptoms sound upper airway in nature and we will try to address this, treat any component of allergies, occult breakthrough GERD on Prilosec once daily.  We will also perform PFT to quantify obstruction.  She might benefit from scheduled BD therapy.  I suspect at least some of her dyspnea is due to left lung scarring from treatment of her lung cancer.

## 2023-04-09 NOTE — Progress Notes (Signed)
Subjective:    Patient ID: Evelyn Jordan, female    DOB: [birth year], 72 y.o.   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  I saw her in July when she noticed progressive dyspnea, cough, wheeze, questionably related in time to the Imfinzi dosing.  She continues to deal with persistent dry cough.  CT chest with some increased inflammatory change in the posterior left upper lobe and anterior left lower lobe.  She was given narcotic cough syrup.  Based on her most recent CT her immunotherapy was discontinued and she was started on prednisone taper.  Plan to repeat her chest CT in about 2 months. She has made a remarkable improvement on the prednisone.   CT chest 01/01/2023 reviewed by me, showed left-sided volume loss, patchy opacities in the left lung especially superior segment left lower lobe, posterior left upper lobe that have increased.  There are some associated irregular bronchiectasis.  Stable calcified left-sided pulmonary nodules.  There was esophageal wall thickening, increased mediastinal edema and pericardial effusion.  ROV 04/09/2023 --72 year old woman with lingular squamous cell lung cancer treated with chemoradiation and then Imfinzi complicated by apparent pneumonitis related to her immunotherapy.  Also had pleural effusion, pericardial effusion.  Repeat CT scan of the chest done 03/05/2023 reviewed by me, shows rate patient change in the area of her lingular mass no evidence of metastatic disease.  This been some improvement in left midlung infiltrate. She has persistent cough, some hoarseness. Exertional SOB when exerting. Also sets off coughing    Review of Systems As per HPI  Past Medical History:  Diagnosis Date   Depression    Insomnia    Lung cancer (HCC) 07/25/2022   Osteoporosis    Pulmonary nodule    Thyroid nodule      Family  History  Problem Relation Age of Onset   Alzheimer's disease Mother    Hypertension Father    Stroke Father    Hyperlipidemia Brother    Breast cancer Neg Hx      Social History   Socioeconomic History   Marital status: Married    Spouse name: Archie   Number of children: Not on file   Years of education: Not on file   Highest education level: Not on file  Occupational History    Comment: retired Engineer, civil (consulting)   Tobacco Use   Smoking status: Former    Current packs/day: 0.00    Average packs/day: 1 pack/day for 41.0 years (41.0 ttl pk-yrs)    Types: Cigarettes    Start date: 28    Quit date: 2012    Years since quitting: 12.8   Smokeless tobacco: Never  Vaping Use   Vaping status: Never Used  Substance and Sexual Activity   Alcohol use: Yes    Comment: Rarely   Drug use: No   Sexual activity: Not on file  Other Topics Concern   Not on file  Social History Narrative   Not on file   Social Determinants of Health   Financial Resource Strain: Low Risk  (10/21/2022)   Overall Financial Resource Strain (CARDIA)    Difficulty of Paying Living Expenses: Not hard at all  Food Insecurity: No Food Insecurity (10/21/2022)   Hunger Vital Sign    Worried About Running Out of Food in the Last Year: Never true    Ran Out of Food in  the Last Year: Never true  Transportation Needs: No Transportation Needs (10/21/2022)   PRAPARE - Administrator, Civil Service (Medical): No    Lack of Transportation (Non-Medical): No  Physical Activity: Insufficiently Active (10/21/2022)   Exercise Vital Sign    Days of Exercise per Week: 7 days    Minutes of Exercise per Session: 20 min  Stress: No Stress Concern Present (10/21/2022)   [employer] of Occupational Health - Occupational Stress Questionnaire    Feeling of Stress : Not at all  Social Connections: Moderately Isolated (10/21/2022)   Social Connection and Isolation Panel [NHANES]    Frequency of Communication with Friends  and Family: More than three times a week    Frequency of Social Gatherings with Friends and Family: More than three times a week    Attends Religious Services: Never    Database administrator or Organizations: No    Attends Banker Meetings: Never    Marital Status: Married  Catering manager Violence: Not At Risk (10/21/2022)   Humiliation, Afraid, Rape, and Kick questionnaire    Fear of Current or Ex-Partner: No    Emotionally Abused: No    Physically Abused: No    Sexually Abused: No    From [place], has done school in [place]. Loves to camp No known TB testing, no hx known exposure.    Allergies  Allergen Reactions   Compazine [Prochlorperazine Edisylate]     Neck hyperextends     Outpatient Medications Prior to Visit  Medication Sig Dispense Refill   albuterol (PROVENTIL) (2.5 MG/3ML) 0.083% nebulizer solution Take 3 mLs (2.5 mg total) by nebulization every 6 (six) hours as needed for wheezing or shortness of breath. 75 mL 12   albuterol (VENTOLIN HFA) 108 (90 Base) MCG/ACT inhaler Inhale 2 puffs into the lungs every 6 (six) hours as needed for wheezing or shortness of breath. 8 g 6   ALPRAZolam (XANAX) 0.5 MG tablet Take 1 tablet (0.5 mg total) by mouth at bedtime as needed for anxiety or sleep. 30 tablet 5   docusate sodium (COLACE) 100 MG capsule Take 100 mg by mouth every other day.     eszopiclone (LUNESTA) 2 MG TABS tablet Take 1 tablet (2 mg total) by mouth at bedtime as needed for sleep. Take immediately before bedtime 30 tablet 5   HYDROcodone bit-homatropine (HYCODAN) 5-1.5 MG/5ML syrup Take 5 mLs by mouth every 6 (six) hours as needed for cough. 120 mL 0   levothyroxine (SYNTHROID) 50 MCG tablet Take 1 tablet (50 mcg total) by mouth daily before breakfast. 30 tablet 2   No facility-administered medications prior to visit.        Objective:   Physical Exam  Vitals:   04/09/23 1346  BP: 122/70  Pulse: 83  Temp: 98.5 F (36.9 C)  TempSrc: Oral  SpO2: 97%   Weight: 121 lb (54.9 kg)  Height: 5\' 3"  (1.6 m)     Gen: Pleasant, well-nourished, in no distress,  normal affect  ENT: No lesions,  mouth clear,  oropharynx clear, no postnasal drip, mild stridor  Neck: No JVD, mild stridor  Lungs: No use of accessory muscles, some rhonchi and inspiratory and expiratory low pitched wheeze on the left  Cardiovascular: RRR, heart sounds normal, no murmur or gallops, no peripheral edema  Musculoskeletal: No deformities, no cyanosis or clubbing  Neuro: alert, awake, non focal  Skin: Warm, no lesions or rash    Assessment & Plan:  Malignant neoplasm of unspecified part of unspecified bronchus or lung (HCC) Most recent CT chest showed improvement in the left midlung which I presume is due to resolution of her pneumonitis.  She has finished the prednisone.  Follow-up imaging per Dr. Asa Lente plans.  She is currently on observation.  Dyspnea Some of her symptoms sound upper airway in nature and we will try to address this, treat any component of allergies, occult breakthrough GERD on Prilosec once daily.  We will also perform PFT to quantify obstruction.  She might benefit from scheduled BD therapy.  I suspect at least some of her dyspnea is due to left lung scarring from treatment of her lung cancer.     Levy Pupa, MD, PhD 04/09/2023, 2:05 PM [facility] [phone] or if no answer before 7:00PM call [phone] For any issues after 7:00PM please call eLink [phone]

## 2023-04-09 NOTE — Patient Instructions (Signed)
We reviewed your CT scan of the chest today. Get your repeat scan as per Dr. Asa Lente plans Increase your omeprazole to 20 mg twice a day for 2 weeks to see if this helps with reflux, upper airway irritation and cough Start taking loratadine 10 mg once daily to see if this benefits throat irritation We will perform full pulmonary function testing at next office visit Follow Dr. Delton Coombes with full PFTs on the same day

## 2023-04-09 NOTE — Assessment & Plan Note (Signed)
Most recent CT chest showed improvement in the left midlung which I presume is due to resolution of her pneumonitis.  She has finished the prednisone.  Follow-up imaging per Dr. Asa Lente plans.  She is currently on observation.

## 2023-04-14 ENCOUNTER — Encounter: Payer: Self-pay | Admitting: Family Medicine

## 2023-04-14 DIAGNOSIS — Z8249 Family history of ischemic heart disease and other diseases of the circulatory system: Secondary | ICD-10-CM

## 2023-04-14 DIAGNOSIS — Z720 Tobacco use: Secondary | ICD-10-CM

## 2023-04-15 ENCOUNTER — Ambulatory Visit: Payer: Medicare Other | Admitting: Cardiology

## 2023-04-18 ENCOUNTER — Encounter: Payer: Self-pay | Admitting: Internal Medicine

## 2023-04-20 ENCOUNTER — Encounter (HOSPITAL_BASED_OUTPATIENT_CLINIC_OR_DEPARTMENT_OTHER): Payer: Medicare Other

## 2023-04-20 ENCOUNTER — Other Ambulatory Visit: Payer: Self-pay | Admitting: *Deleted

## 2023-04-20 DIAGNOSIS — E041 Nontoxic single thyroid nodule: Secondary | ICD-10-CM

## 2023-04-20 DIAGNOSIS — C3490 Malignant neoplasm of unspecified part of unspecified bronchus or lung: Secondary | ICD-10-CM

## 2023-04-20 DIAGNOSIS — E039 Hypothyroidism, unspecified: Secondary | ICD-10-CM

## 2023-04-21 ENCOUNTER — Inpatient Hospital Stay: Payer: Medicare Other | Attending: Internal Medicine

## 2023-04-21 DIAGNOSIS — E041 Nontoxic single thyroid nodule: Secondary | ICD-10-CM

## 2023-04-21 DIAGNOSIS — Z95828 Presence of other vascular implants and grafts: Secondary | ICD-10-CM

## 2023-04-21 DIAGNOSIS — Z452 Encounter for adjustment and management of vascular access device: Secondary | ICD-10-CM | POA: Insufficient documentation

## 2023-04-21 DIAGNOSIS — C3412 Malignant neoplasm of upper lobe, left bronchus or lung: Secondary | ICD-10-CM | POA: Diagnosis not present

## 2023-04-21 DIAGNOSIS — E039 Hypothyroidism, unspecified: Secondary | ICD-10-CM | POA: Diagnosis not present

## 2023-04-21 DIAGNOSIS — Z79899 Other long term (current) drug therapy: Secondary | ICD-10-CM | POA: Diagnosis not present

## 2023-04-21 DIAGNOSIS — C3490 Malignant neoplasm of unspecified part of unspecified bronchus or lung: Secondary | ICD-10-CM

## 2023-04-21 LAB — CMP (CANCER CENTER ONLY)
ALT: 7 U/L (ref 0–44)
AST: 13 U/L — ABNORMAL LOW (ref 15–41)
Albumin: 3.6 g/dL (ref 3.5–5.0)
Alkaline Phosphatase: 59 U/L (ref 38–126)
Anion gap: 6 (ref 5–15)
BUN: 14 mg/dL (ref 8–23)
CO2: 27 mmol/L (ref 22–32)
Calcium: 8.7 mg/dL — ABNORMAL LOW (ref 8.9–10.3)
Chloride: 105 mmol/L (ref 98–111)
Creatinine: 0.95 mg/dL (ref 0.44–1.00)
GFR, Estimated: >60 mL/min (ref >60)
Glucose, Bld: 120 mg/dL — ABNORMAL HIGH (ref 70–99)
Potassium: 4.3 mmol/L (ref 3.5–5.1)
Sodium: 138 mmol/L (ref 135–145)
Total Bilirubin: 0.3 mg/dL (ref <1.2)
Total Protein: 6.7 g/dL (ref 6.5–8.1)

## 2023-04-21 LAB — CBC WITH DIFFERENTIAL (CANCER CENTER ONLY)
Abs Immature Granulocytes: 0.03 10*3/uL (ref 0.00–0.07)
Basophils Absolute: 0.1 10*3/uL (ref 0.0–0.1)
Basophils Relative: 1 %
Eosinophils Absolute: 0.1 10*3/uL (ref 0.0–0.5)
Eosinophils Relative: 2 %
HCT: 33.3 % — ABNORMAL LOW (ref 36.0–46.0)
Hemoglobin: 11.3 g/dL — ABNORMAL LOW (ref 12.0–15.0)
Immature Granulocytes: 0 %
Lymphocytes Relative: 4 %
Lymphs Abs: 0.3 10*3/uL — ABNORMAL LOW (ref 0.7–4.0)
MCH: 31.3 pg (ref 26.0–34.0)
MCHC: 33.9 g/dL (ref 30.0–36.0)
MCV: 92.2 fL (ref 80.0–100.0)
Monocytes Absolute: 0.8 10*3/uL (ref 0.1–1.0)
Monocytes Relative: 10 %
Neutro Abs: 6.8 10*3/uL (ref 1.7–7.7)
Neutrophils Relative %: 83 %
Platelet Count: 272 10*3/uL (ref 150–400)
RBC: 3.61 MIL/uL — ABNORMAL LOW (ref 3.87–5.11)
RDW: 13.7 % (ref 11.5–15.5)
WBC Count: 8.1 10*3/uL (ref 4.0–10.5)
nRBC: 0 % (ref 0.0–0.2)

## 2023-04-21 LAB — TSH: TSH: 25.844 u[IU]/mL — ABNORMAL HIGH (ref 0.350–4.500)

## 2023-04-21 MED ORDER — SODIUM CHLORIDE 0.9% FLUSH
10.0000 mL | Freq: Once | INTRAVENOUS | Status: AC
  Administered 2023-04-21: 10 mL

## 2023-04-21 MED ORDER — HEPARIN SOD (PORK) LOCK FLUSH 100 UNIT/ML IV SOLN
500.0000 [IU] | Freq: Once | INTRAVENOUS | Status: AC
  Administered 2023-04-21: 500 [IU]

## 2023-04-22 LAB — T4: T4, Total: 6.2 ug/dL (ref 4.5–12.0)

## 2023-04-26 ENCOUNTER — Encounter: Payer: Self-pay | Admitting: Family Medicine

## 2023-04-27 ENCOUNTER — Encounter: Payer: Self-pay | Admitting: Internal Medicine

## 2023-04-28 ENCOUNTER — Other Ambulatory Visit: Payer: Self-pay | Admitting: Physician Assistant

## 2023-04-28 ENCOUNTER — Encounter: Payer: Self-pay | Admitting: Internal Medicine

## 2023-04-28 ENCOUNTER — Other Ambulatory Visit: Payer: Self-pay | Admitting: Medical Oncology

## 2023-04-28 DIAGNOSIS — E039 Hypothyroidism, unspecified: Secondary | ICD-10-CM

## 2023-04-28 DIAGNOSIS — R053 Chronic cough: Secondary | ICD-10-CM

## 2023-04-28 MED ORDER — LEVOTHYROXINE SODIUM 50 MCG PO TABS: 50.0000 ug | ORAL_TABLET | Freq: Every day | ORAL | 2 refills | Status: DC

## 2023-04-28 MED ORDER — HYDROCODONE BIT-HOMATROP MBR 5-1.5 MG/5ML PO SOLN: 5.0000 mL | Freq: Four times a day (QID) | ORAL | 0 refills | Status: DC | PRN

## 2023-04-29 ENCOUNTER — Other Ambulatory Visit (HOSPITAL_BASED_OUTPATIENT_CLINIC_OR_DEPARTMENT_OTHER): Payer: Self-pay

## 2023-04-29 ENCOUNTER — Encounter: Payer: Self-pay | Admitting: Internal Medicine

## 2023-04-29 ENCOUNTER — Ambulatory Visit
Admission: RE | Admit: 2023-04-29 | Discharge: 2023-04-29 | Disposition: A | Discharge status: Home / Self Care | Payer: Self-pay | Source: Ambulatory Visit | Attending: Family Medicine | Admitting: Family Medicine

## 2023-04-29 DIAGNOSIS — Z720 Tobacco use: Secondary | ICD-10-CM

## 2023-04-29 DIAGNOSIS — I2510 Atherosclerotic heart disease of native coronary artery without angina pectoris: Secondary | ICD-10-CM | POA: Diagnosis not present

## 2023-04-29 DIAGNOSIS — C3490 Malignant neoplasm of unspecified part of unspecified bronchus or lung: Secondary | ICD-10-CM

## 2023-04-29 DIAGNOSIS — Z8249 Family history of ischemic heart disease and other diseases of the circulatory system: Secondary | ICD-10-CM

## 2023-04-30 ENCOUNTER — Encounter: Payer: Self-pay | Admitting: Internal Medicine

## 2023-04-30 ENCOUNTER — Encounter: Payer: Self-pay | Admitting: Family Medicine

## 2023-04-30 ENCOUNTER — Other Ambulatory Visit: Payer: Self-pay | Admitting: Medical Oncology

## 2023-04-30 ENCOUNTER — Ambulatory Visit (HOSPITAL_BASED_OUTPATIENT_CLINIC_OR_DEPARTMENT_OTHER): Payer: Medicare Other | Admitting: Emergency Medicine

## 2023-04-30 ENCOUNTER — Ambulatory Visit: Payer: Medicare Other | Admitting: Family Medicine

## 2023-04-30 VITALS — BP 138/80 | HR 89 | Temp 97.7°F | Ht 63.0 in | Wt 121.2 lb

## 2023-04-30 DIAGNOSIS — C3490 Malignant neoplasm of unspecified part of unspecified bronchus or lung: Secondary | ICD-10-CM | POA: Diagnosis not present

## 2023-04-30 DIAGNOSIS — E032 Hypothyroidism due to medicaments and other exogenous substances: Secondary | ICD-10-CM | POA: Diagnosis not present

## 2023-04-30 DIAGNOSIS — C3492 Malignant neoplasm of unspecified part of left bronchus or lung: Secondary | ICD-10-CM | POA: Diagnosis not present

## 2023-04-30 DIAGNOSIS — I3139 Other pericardial effusion (noninflammatory): Secondary | ICD-10-CM | POA: Diagnosis not present

## 2023-04-30 DIAGNOSIS — R1031 Right lower quadrant pain: Secondary | ICD-10-CM

## 2023-04-30 LAB — PULMONARY FUNCTION TEST
DL/VA % pred: 101 %
DL/VA: 4.24 ml/min/mmHg/L
DLCO cor % pred: 82 %
DLCO cor: 15.62 ml/min/mmHg
DLCO unc % pred: 76 %
DLCO unc: 14.51 ml/min/mmHg
FEF 25-75 Post: 1.51 L/s
FEF 25-75 Pre: 1.4 L/s
FEF2575-%Change-Post: 7 %
FEF2575-%Pred-Post: 83 %
FEF2575-%Pred-Pre: 77 %
FEV1-%Change-Post: 1 %
FEV1-%Pred-Post: 86 %
FEV1-%Pred-Pre: 85 %
FEV1-Post: 1.88 L
FEV1-Pre: 1.85 L
FEV1FVC-%Change-Post: 3 %
FEV1FVC-%Pred-Pre: 99 %
FEV6-%Change-Post: -2 %
FEV6-%Pred-Post: 87 %
FEV6-%Pred-Pre: 89 %
FEV6-Post: 2.4 L
FEV6-Pre: 2.46 L
FEV6FVC-%Pred-Post: 104 %
FEV6FVC-%Pred-Pre: 104 %
FVC-%Change-Post: -2 %
FVC-%Pred-Post: 83 %
FVC-%Pred-Pre: 85 %
FVC-Post: 2.4 L
FVC-Pre: 2.46 L
Post FEV1/FVC ratio: 78 %
Post FEV6/FVC ratio: 100 %
Pre FEV1/FVC ratio: 75 %
Pre FEV6/FVC Ratio: 100 %
RV % pred: 80 %
RV: 1.74 L
TLC % pred: 85 %
TLC: 4.23 L

## 2023-04-30 NOTE — Patient Instructions (Signed)
Please follow up if symptoms do not improve or as needed.    I have ordered an abdominal and pelvic CT scan and we will call you to get this set up.  Hopefully you will have an appointment before you leave on your cruise.

## 2023-04-30 NOTE — Progress Notes (Signed)
Full PFT Performed Today  

## 2023-04-30 NOTE — Patient Instructions (Signed)
Full PFT Performed Today  

## 2023-04-30 NOTE — Progress Notes (Signed)
Subjective  CC:  Chief Complaint  Patient presents with   Abdominal Pain    Pt stated that she has been having abdominal pain for the past 6 weeks and has gotten worse. Prolisec is all that she is taking to see if it would help but it does not.    HPI: Evelyn Jordan is a 72 y.o. female who presents to the office today to address the problems listed above in the chief complaint. 72 year old female with non small cell lung cancer currently on observation presents due to progressive right sided abdominal pain.  She sent me a message saying she is having gnawing upper abdominal pain about 6 weeks ago.  We started Prilosec.  Unfortunately symptoms persist and are slightly worse.  Of note, she had pneumonitis that required treatment with prednisone for her chemotherapy, this has resolved.  I did review notes from oncology and pulmonology. She reports pain is gnawing in nature, constant and 2-3 out of 10 in severity.  However it does keep her awake at night.  It has become irritating.  This unrelated to oral intake, bowel movements, position changes.  She has had no associated fevers, chills, change in bowel habits, nausea or vomiting.  She cannot affect the pain.  She has not tried any pain medications.  Recent lab work from oncology shows mild anemia and normal LFTs.  Normal white count. I reviewed oncology notes: Updated the chart, small asymptomatic pericardial effusion they are monitoring.  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 Lab Results  Component Value Date   TSH 25.844 (H) 04/21/2023   Lab Results  Component Value Date   ALT 7 04/21/2023   AST 13 (L) 04/21/2023   ALKPHOS 59 04/21/2023   BILITOT 0.3 04/21/2023   Lab Results  Component Value Date   WBC 8.1 04/21/2023   HGB 11.3 (L) 04/21/2023   HCT 33.3 (L) 04/21/2023   MCV 92.2 04/21/2023   PLT 272 04/21/2023   Lab Results  Component Value Date   NA  138 04/21/2023   CL 105 04/21/2023   K 4.3 04/21/2023   CO2 27 04/21/2023   BUN 14 04/21/2023   CREATININE 0.95 04/21/2023   GFRNONAA >60 04/21/2023   CALCIUM 8.7 (L) 04/21/2023   ALBUMIN 3.6 04/21/2023   GLUCOSE 120 (H) 04/21/2023    Assessment  1. Right lower quadrant abdominal pain   2. Malignant neoplasm of unspecified part of unspecified bronchus or lung (HCC)   3. Non-small cell cancer of left lung (HCC)   4. Pericardial effusion   5. Hypothyroidism due to medication      Plan  Right lower quadrant abdominal pain patient with non-small cell lung cancer: Exam shows localized tenderness with possible small palpable mass.  It is in the right side of the abdomen, upper part of the right lower quadrant. she has no pelvic tenderness.  No upper abdominal tenderness.  No hepatomegaly.  CT of the abdomen pelvis with contrast ordered for further clarification.  She may use Tylenol to see if that helps alleviate some of her pain.  She may stop the Prilosec.  I will forward these notes to Dr. Shirline Frees for his review as well.  He can update the imaging request if needed.  Patient is traveling the country on a cruise November 23 to December 8.  Will try to get the abdominal CT done as soon as she gets back.  Patient understands and  agrees with care plan. Lung cancer treatment per oncology and pulmonology. Monitor thyroid function tests  I spent a total of 42 minutes for this patient encounter. Time spent included preparation, face-to-face counseling with the patient and coordination of care, review of chart and records, and documentation of the encounter.  Follow up: as needed 10/27/2023  Orders Placed This Encounter  Procedures   CT ABDOMEN PELVIS W CONTRAST   No orders of the defined types were placed in this encounter.     I reviewed the patients updated PMH, FH, and SocHx.    Patient Active Problem List   Diagnosis Date Noted   Non-small cell cancer of left lung (HCC) 04/30/2023     Priority: High   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    Priority: High   Malignant neoplasm of unspecified part of unspecified bronchus or lung (HCC) 08/11/2022    Priority: High   Primary insomnia 12/04/2017    Priority: High   Pericardial effusion 04/30/2023    Priority: Medium    Depression, major, single episode, moderate (HCC) 04/22/2021    Priority: Medium    Osteoporosis 12/23/2017    Priority: Medium    Pulmonary nodules 04/14/2019    Priority: Low   Thyroid nodule 04/14/2019    Priority: Low   Synovial cyst of popliteal space 04/05/2018    Priority: Low   Tobacco use 01/22/2023   Port-A-Cath in place 09/08/2022   Encounter for antineoplastic chemotherapy 08/25/2022   Abnormal CT of the chest 07/29/2022   Current Meds  Medication Sig   albuterol (PROVENTIL) (2.5 MG/3ML) 0.083% nebulizer solution Take 3 mLs (2.5 mg total) by nebulization every 6 (six) hours as needed for wheezing or shortness of breath.   albuterol (VENTOLIN HFA) 108 (90 Base) MCG/ACT inhaler Inhale 2 puffs into the lungs every 6 (six) hours as needed for wheezing or shortness of breath.   ALPRAZolam (XANAX) 0.5 MG tablet Take 1 tablet (0.5 mg total) by mouth at bedtime as needed for anxiety or sleep.   docusate sodium (COLACE) 100 MG capsule Take 100 mg by mouth every other day.   eszopiclone (LUNESTA) 2 MG TABS tablet Take 1 tablet (2 mg total) by mouth at bedtime as needed for sleep. Take immediately before bedtime   HYDROcodone bit-homatropine (HYCODAN) 5-1.5 MG/5ML syrup Take 5 mLs by mouth every 6 (six) hours as needed for cough.   levothyroxine (SYNTHROID) 50 MCG tablet Take 1 tablet (50 mcg total) by mouth daily before breakfast.    Allergies: Patient is allergic to compazine [prochlorperazine edisylate]. Family History: Patient family history includes Alzheimer's disease in her mother; Hyperlipidemia in her brother; Hypertension in her father; Stroke in her father. Social History:   Patient  reports that she quit smoking about 12 years ago. Her smoking use included cigarettes. She started smoking about 53 years ago. She has a 41 pack-year smoking history. She has never used smokeless tobacco. She reports current alcohol use. She reports that she does not use drugs.  Review of Systems: Constitutional: Negative for fever malaise or anorexia Cardiovascular: negative for chest pain Respiratory: negative for SOB or persistent cough Gastrointestinal: negative for abdominal pain  Objective  Vitals: BP 138/80   Pulse 89   Temp 97.7 F (36.5 C)   Ht 5\' 3"  (1.6 m)   Wt 121 lb 3.2 oz (55 kg)   SpO2 94%   BMI 21.47 kg/m  General: no acute distress , A&Ox3, appears well HEENT: PEERL, conjunctiva normal, neck is  supple Cardiovascular:  RRR without murmur or gallop.  Respiratory: Diffuse expiratory wheezing throughout, fair air movement Abdomen: No midepigastric tenderness, soft, normal bowel sounds, no hepatosplenomegaly, right-sided abdominal localized tenderness with possible small mass palpated.  No right lower quadrant tenderness Skin:  Warm, no rashes  Commons side effects, risks, benefits, and alternatives for medications and treatment plan prescribed today were discussed, and the patient expressed understanding of the given instructions. Patient is instructed to call or message via MyChart if he/she has any questions or concerns regarding our treatment plan. No barriers to understanding were identified. We discussed Red Flag symptoms and signs in detail. Patient expressed understanding regarding what to do in case of urgent or emergency type symptoms.  Medication list was reconciled, printed and provided to the patient in AVS. Patient instructions and summary information was reviewed with the patient as documented in the AVS. This note was prepared with assistance of Dragon voice recognition software. Occasional wrong-word or sound-a-like substitutions may have occurred  due to the inherent limitations of voice recognition software

## 2023-05-01 ENCOUNTER — Telehealth: Payer: Self-pay

## 2023-05-01 ENCOUNTER — Ambulatory Visit
Admission: RE | Admit: 2023-05-01 | Discharge: 2023-05-01 | Disposition: A | Discharge status: Home / Self Care | Payer: Medicare Other | Source: Ambulatory Visit | Attending: Family Medicine | Admitting: Family Medicine

## 2023-05-01 DIAGNOSIS — R1031 Right lower quadrant pain: Secondary | ICD-10-CM

## 2023-05-01 DIAGNOSIS — K5900 Constipation, unspecified: Secondary | ICD-10-CM | POA: Diagnosis not present

## 2023-05-01 DIAGNOSIS — R93421 Abnormal radiologic findings on diagnostic imaging of right kidney: Secondary | ICD-10-CM | POA: Diagnosis not present

## 2023-05-01 DIAGNOSIS — R599 Enlarged lymph nodes, unspecified: Secondary | ICD-10-CM | POA: Diagnosis not present

## 2023-05-01 MED ORDER — IOPAMIDOL (ISOVUE-300) INJECTION 61%
500.0000 mL | Freq: Once | INTRAVENOUS | Status: AC | PRN
  Administered 2023-05-01: 100 mL via INTRAVENOUS

## 2023-05-01 NOTE — Telephone Encounter (Signed)
Received VM from patient in regards to CT abdomen results that PCP sent over to our office and the plan of action.  Pt stated that she will be on a cruise from tomorrow until December 6th.  Spoke with patient and informed her that results will be sent to Dr. Arbutus Ped for review.  Patient is requesting that Dr. Arbutus Ped send the results and plan of action through mychart so she can see them while on her cruise. Pt stated that our office can schedule neccessary appts while she is away because she wants to start as quickly as possible. Pt verbalized understanding.

## 2023-05-01 NOTE — Telephone Encounter (Signed)
[facility] Radiology STAT report in pt's chart.

## 2023-05-01 NOTE — Progress Notes (Signed)
Please see this report and recommend further evaluation. Pt is supposed to travel out of country tomorrow for a 12 day cruise fyi

## 2023-05-04 ENCOUNTER — Encounter: Payer: Self-pay | Admitting: Internal Medicine

## 2023-05-04 NOTE — Telephone Encounter (Signed)
Dr. Arbutus Ped reviewed scan from PCP.  Mychart message was sent to patient, per request.

## 2023-05-05 ENCOUNTER — Other Ambulatory Visit: Payer: Self-pay

## 2023-05-05 ENCOUNTER — Encounter: Payer: Self-pay | Admitting: Family Medicine

## 2023-05-05 DIAGNOSIS — N2889 Other specified disorders of kidney and ureter: Secondary | ICD-10-CM

## 2023-05-05 NOTE — Progress Notes (Signed)
Referral placed to Urology

## 2023-05-06 ENCOUNTER — Encounter: Payer: Self-pay | Admitting: Family Medicine

## 2023-05-09 ENCOUNTER — Emergency Department (HOSPITAL_COMMUNITY): Payer: Medicare Other

## 2023-05-09 ENCOUNTER — Encounter (HOSPITAL_COMMUNITY): Payer: Self-pay

## 2023-05-09 ENCOUNTER — Inpatient Hospital Stay (HOSPITAL_COMMUNITY)
Admission: EM | Admit: 2023-05-09 | Discharge: 2023-05-11 | DRG: 699 | Disposition: A | Discharge status: Home / Self Care | Payer: Medicare Other | Attending: Internal Medicine | Admitting: Internal Medicine

## 2023-05-09 ENCOUNTER — Other Ambulatory Visit: Payer: Self-pay

## 2023-05-09 DIAGNOSIS — I700 Atherosclerosis of aorta: Secondary | ICD-10-CM | POA: Diagnosis present

## 2023-05-09 DIAGNOSIS — R918 Other nonspecific abnormal finding of lung field: Secondary | ICD-10-CM | POA: Diagnosis present

## 2023-05-09 DIAGNOSIS — R319 Hematuria, unspecified: Secondary | ICD-10-CM | POA: Diagnosis not present

## 2023-05-09 DIAGNOSIS — N179 Acute kidney failure, unspecified: Secondary | ICD-10-CM | POA: Diagnosis not present

## 2023-05-09 DIAGNOSIS — T451X5A Adverse effect of antineoplastic and immunosuppressive drugs, initial encounter: Secondary | ICD-10-CM | POA: Diagnosis present

## 2023-05-09 DIAGNOSIS — K5900 Constipation, unspecified: Secondary | ICD-10-CM | POA: Diagnosis not present

## 2023-05-09 DIAGNOSIS — G4700 Insomnia, unspecified: Secondary | ICD-10-CM | POA: Diagnosis present

## 2023-05-09 DIAGNOSIS — K5730 Diverticulosis of large intestine without perforation or abscess without bleeding: Secondary | ICD-10-CM | POA: Diagnosis present

## 2023-05-09 DIAGNOSIS — N12 Tubulo-interstitial nephritis, not specified as acute or chronic: Principal | ICD-10-CM | POA: Diagnosis present

## 2023-05-09 DIAGNOSIS — R109 Unspecified abdominal pain: Secondary | ICD-10-CM | POA: Diagnosis present

## 2023-05-09 DIAGNOSIS — E032 Hypothyroidism due to medicaments and other exogenous substances: Secondary | ICD-10-CM | POA: Diagnosis not present

## 2023-05-09 DIAGNOSIS — Z8249 Family history of ischemic heart disease and other diseases of the circulatory system: Secondary | ICD-10-CM | POA: Diagnosis not present

## 2023-05-09 DIAGNOSIS — I3139 Other pericardial effusion (noninflammatory): Secondary | ICD-10-CM | POA: Diagnosis not present

## 2023-05-09 DIAGNOSIS — Z79899 Other long term (current) drug therapy: Secondary | ICD-10-CM

## 2023-05-09 DIAGNOSIS — D649 Anemia, unspecified: Secondary | ICD-10-CM | POA: Diagnosis present

## 2023-05-09 DIAGNOSIS — N151 Renal and perinephric abscess: Secondary | ICD-10-CM | POA: Diagnosis present

## 2023-05-09 DIAGNOSIS — N2889 Other specified disorders of kidney and ureter: Principal | ICD-10-CM | POA: Diagnosis present

## 2023-05-09 DIAGNOSIS — C3490 Malignant neoplasm of unspecified part of unspecified bronchus or lung: Secondary | ICD-10-CM | POA: Diagnosis not present

## 2023-05-09 DIAGNOSIS — F32A Depression, unspecified: Secondary | ICD-10-CM | POA: Diagnosis present

## 2023-05-09 DIAGNOSIS — N280 Ischemia and infarction of kidney: Principal | ICD-10-CM | POA: Diagnosis present

## 2023-05-09 DIAGNOSIS — Z87891 Personal history of nicotine dependence: Secondary | ICD-10-CM

## 2023-05-09 DIAGNOSIS — C3492 Malignant neoplasm of unspecified part of left bronchus or lung: Secondary | ICD-10-CM | POA: Diagnosis not present

## 2023-05-09 DIAGNOSIS — M810 Age-related osteoporosis without current pathological fracture: Secondary | ICD-10-CM | POA: Diagnosis present

## 2023-05-09 DIAGNOSIS — Z79890 Hormone replacement therapy (postmenopausal): Secondary | ICD-10-CM

## 2023-05-09 DIAGNOSIS — R053 Chronic cough: Secondary | ICD-10-CM | POA: Diagnosis present

## 2023-05-09 DIAGNOSIS — R310 Gross hematuria: Secondary | ICD-10-CM | POA: Diagnosis present

## 2023-05-09 DIAGNOSIS — Z95828 Presence of other vascular implants and grafts: Secondary | ICD-10-CM | POA: Diagnosis not present

## 2023-05-09 DIAGNOSIS — Z888 Allergy status to other drugs, medicaments and biological substances status: Secondary | ICD-10-CM

## 2023-05-09 DIAGNOSIS — K5790 Diverticulosis of intestine, part unspecified, without perforation or abscess without bleeding: Secondary | ICD-10-CM | POA: Diagnosis not present

## 2023-05-09 DIAGNOSIS — R9341 Abnormal radiologic findings on diagnostic imaging of renal pelvis, ureter, or bladder: Secondary | ICD-10-CM | POA: Diagnosis not present

## 2023-05-09 LAB — CBC WITH DIFFERENTIAL/PLATELET
Abs Immature Granulocytes: 0.02 10*3/uL (ref 0.00–0.07)
Basophils Absolute: 0 10*3/uL (ref 0.0–0.1)
Basophils Relative: 1 %
Eosinophils Absolute: 0.2 10*3/uL (ref 0.0–0.5)
Eosinophils Relative: 2 %
HCT: 36 % (ref 36.0–46.0)
Hemoglobin: 11.4 g/dL — ABNORMAL LOW (ref 12.0–15.0)
Immature Granulocytes: 0 %
Lymphocytes Relative: 5 %
Lymphs Abs: 0.4 10*3/uL — ABNORMAL LOW (ref 0.7–4.0)
MCH: 30.2 pg (ref 26.0–34.0)
MCHC: 31.7 g/dL (ref 30.0–36.0)
MCV: 95.5 fL (ref 80.0–100.0)
Monocytes Absolute: 0.8 10*3/uL (ref 0.1–1.0)
Monocytes Relative: 10 %
Neutro Abs: 6.8 10*3/uL (ref 1.7–7.7)
Neutrophils Relative %: 82 %
Platelets: 326 10*3/uL (ref 150–400)
RBC: 3.77 MIL/uL — ABNORMAL LOW (ref 3.87–5.11)
RDW: 14.4 % (ref 11.5–15.5)
WBC: 8.3 10*3/uL (ref 4.0–10.5)
nRBC: 0 % (ref 0.0–0.2)

## 2023-05-09 LAB — URINALYSIS, ROUTINE W REFLEX MICROSCOPIC
Bilirubin Urine: NEGATIVE
Glucose, UA: NEGATIVE mg/dL
Ketones, ur: NEGATIVE mg/dL
Leukocytes,Ua: NEGATIVE
Nitrite: NEGATIVE
Protein, ur: 100 mg/dL — AB
RBC / HPF: >50 RBC/hpf (ref 0–5)
Specific Gravity, Urine: 1.004 — ABNORMAL LOW (ref 1.005–1.030)
pH: 6 (ref 5.0–8.0)

## 2023-05-09 LAB — COMPREHENSIVE METABOLIC PANEL
ALT: 19 U/L (ref 0–44)
AST: 21 U/L (ref 15–41)
Albumin: 3.6 g/dL (ref 3.5–5.0)
Alkaline Phosphatase: 56 U/L (ref 38–126)
Anion gap: 10 (ref 5–15)
BUN: 23 mg/dL (ref 8–23)
CO2: 23 mmol/L (ref 22–32)
Calcium: 8.7 mg/dL — ABNORMAL LOW (ref 8.9–10.3)
Chloride: 102 mmol/L (ref 98–111)
Creatinine, Ser: 1.23 mg/dL — ABNORMAL HIGH (ref 0.44–1.00)
GFR, Estimated: 47 mL/min — ABNORMAL LOW (ref >60)
Glucose, Bld: 90 mg/dL (ref 70–99)
Potassium: 3.7 mmol/L (ref 3.5–5.1)
Sodium: 135 mmol/L (ref 135–145)
Total Bilirubin: 0.3 mg/dL (ref <1.2)
Total Protein: 7.4 g/dL (ref 6.5–8.1)

## 2023-05-09 LAB — SODIUM, URINE, RANDOM: Sodium, Ur: 48 mmol/L

## 2023-05-09 LAB — CREATININE, URINE, RANDOM: Creatinine, Urine: 20 mg/dL

## 2023-05-09 LAB — I-STAT CG4 LACTIC ACID, ED: Lactic Acid, Venous: 0.5 mmol/L (ref 0.5–1.9)

## 2023-05-09 MED ORDER — ONDANSETRON HCL 4 MG/2ML IJ SOLN
4.0000 mg | Freq: Once | INTRAMUSCULAR | Status: DC
  Filled 2023-05-09: qty 2

## 2023-05-09 MED ORDER — ONDANSETRON HCL 4 MG PO TABS: 4.0000 mg | ORAL_TABLET | Freq: Four times a day (QID) | ORAL | Status: DC | PRN

## 2023-05-09 MED ORDER — IOHEXOL 300 MG/ML  SOLN
100.0000 mL | Freq: Once | INTRAMUSCULAR | Status: AC | PRN
  Administered 2023-05-09: 100 mL via INTRAVENOUS

## 2023-05-09 MED ORDER — SODIUM CHLORIDE 0.9 % IV BOLUS
500.0000 mL | Freq: Once | INTRAVENOUS | Status: AC
  Administered 2023-05-09: 500 mL via INTRAVENOUS

## 2023-05-09 MED ORDER — SODIUM CHLORIDE 0.9 % IV SOLN
2.0000 g | INTRAVENOUS | Status: DC
  Administered 2023-05-10: 2 g via INTRAVENOUS
  Filled 2023-05-09: qty 20

## 2023-05-09 MED ORDER — SENNA 8.6 MG PO TABS
1.0000 | ORAL_TABLET | Freq: Two times a day (BID) | ORAL | Status: DC
  Administered 2023-05-10 (×3): 8.6 mg via ORAL
  Filled 2023-05-09 (×3): qty 1

## 2023-05-09 MED ORDER — ONDANSETRON HCL 4 MG/2ML IJ SOLN: 4.0000 mg | Freq: Four times a day (QID) | INTRAMUSCULAR | Status: DC | PRN

## 2023-05-09 MED ORDER — MORPHINE SULFATE (PF) 4 MG/ML IV SOLN
4.0000 mg | Freq: Once | INTRAVENOUS | Status: DC
  Filled 2023-05-09 (×3): qty 1

## 2023-05-09 MED ORDER — LEVOTHYROXINE SODIUM 50 MCG PO TABS
50.0000 ug | ORAL_TABLET | Freq: Every day | ORAL | Status: DC
  Filled 2023-05-09: qty 1

## 2023-05-09 MED ORDER — ALBUTEROL SULFATE (2.5 MG/3ML) 0.083% IN NEBU: 2.5000 mg | INHALATION_SOLUTION | Freq: Four times a day (QID) | RESPIRATORY_TRACT | Status: DC | PRN

## 2023-05-09 MED ORDER — POLYETHYLENE GLYCOL 3350 17 G PO PACK: 17.0000 g | PACK | Freq: Every day | ORAL | Status: DC | PRN

## 2023-05-09 MED ORDER — ACETAMINOPHEN 325 MG PO TABS
650.0000 mg | ORAL_TABLET | Freq: Four times a day (QID) | ORAL | Status: DC | PRN
  Administered 2023-05-10 – 2023-05-11 (×2): 650 mg via ORAL
  Filled 2023-05-09 (×2): qty 2

## 2023-05-09 MED ORDER — BISACODYL 10 MG RE SUPP: 10.0000 mg | Freq: Every day | RECTAL | Status: DC | PRN

## 2023-05-09 MED ORDER — SODIUM CHLORIDE 0.9 % IV SOLN
2.0000 g | Freq: Once | INTRAVENOUS | Status: AC
Start: 2023-05-09 — End: 2023-05-09
  Administered 2023-05-09: 2 g via INTRAVENOUS
  Filled 2023-05-09: qty 20

## 2023-05-09 MED ORDER — MORPHINE SULFATE (PF) 4 MG/ML IV SOLN
4.0000 mg | Freq: Once | INTRAVENOUS | Status: AC
  Administered 2023-05-09: 4 mg via INTRAVENOUS

## 2023-05-09 MED ORDER — SODIUM CHLORIDE 0.9 % IV SOLN: 2.0000 g | INTRAVENOUS | Status: DC

## 2023-05-09 MED ORDER — DOCUSATE SODIUM 100 MG PO CAPS
100.0000 mg | ORAL_CAPSULE | Freq: Two times a day (BID) | ORAL | Status: DC
  Administered 2023-05-10 (×3): 100 mg via ORAL
  Filled 2023-05-09 (×3): qty 1

## 2023-05-09 MED ORDER — SODIUM CHLORIDE 0.9 % IV SOLN: INTRAVENOUS | Status: DC

## 2023-05-09 MED ORDER — HYDROMORPHONE HCL 1 MG/ML IJ SOLN
0.5000 mg | INTRAMUSCULAR | Status: DC | PRN
Start: 2023-05-09 — End: 2023-05-11
  Administered 2023-05-09 – 2023-05-10 (×4): 1 mg via INTRAVENOUS
  Filled 2023-05-09 (×5): qty 1

## 2023-05-09 MED ORDER — HYDROCODONE-ACETAMINOPHEN 5-325 MG PO TABS
1.0000 | ORAL_TABLET | ORAL | Status: DC | PRN
  Administered 2023-05-10: 1 via ORAL
  Filled 2023-05-09: qty 1

## 2023-05-09 MED ORDER — ACETAMINOPHEN 650 MG RE SUPP: 650.0000 mg | Freq: Four times a day (QID) | RECTAL | Status: DC | PRN

## 2023-05-09 NOTE — Assessment & Plan Note (Signed)
Gently rehydrate update your electrolytes monitor for any obstruction strict I's and O's

## 2023-05-09 NOTE — Assessment & Plan Note (Signed)
CT showing changes which could be infectious versus malignant Appreciate urology consult Covered with antibiotics for now and continue to monitor continue Rocephin Send urine culture

## 2023-05-09 NOTE — Assessment & Plan Note (Signed)
To be followed up with oncologist as an outpatient possibly slowly increasing defer to oncologist regarding significance known history of lung cancer

## 2023-05-09 NOTE — Assessment & Plan Note (Signed)
Chronic-stable.

## 2023-05-09 NOTE — Subjective & Objective (Signed)
Patient presents with worsening right-sided abdominal pain started about 6 weeks ago has been having some blood in the urine starting this morning no fevers no chills no dysuria. CT scan done on 22 November showed abnormal lymph nodes and abnormality of right kidney she was supposed to follow-up with urology today has been having trouble urinating this clots in her urine  Patient endorses chronic cough in association with past history of lung cancer Not on blood thinners

## 2023-05-09 NOTE — Assessment & Plan Note (Signed)
-   Check TSH continue home medications Synthroid at po q day

## 2023-05-09 NOTE — H&P (Signed)
Evelyn Jordan:[id number] DOB: 02/10/1951 DOA: 05/09/2023     PCP: [name], MD   Outpatient Specialists:   CARDS:   Dr.  Odis Hollingshead [facility]  Dr.Byrum  Oncology Dr. Arbutus Ped   Urology Dr. Stann Mainland see Dr. Wyline Mood  Patient arrived to ER on 05/09/23 at 1815 Referred by Attending Terrilee Files, MD   Patient coming from:    home Lives With family    Chief Complaint:   Chief Complaint  Patient presents with   Abdominal Pain    HPI: Evelyn Jordan is a 72 y.o. female with medical history significant of LUng Cancer, pericardial effusion, hypothyroidism, renal mass     Presented with  blood in urine, suprapubic pain  Patient presents with worsening right-sided abdominal pain started about 6 weeks ago has been having some blood in the urine starting this morning no fevers no chills no dysuria. CT scan done on 22 November showed abnormal lymph nodes and abnormality of right kidney she was supposed to follow-up with urology today has been having trouble urinating this clots in her urine  Patient endorses chronic cough in association with past history of lung cancer Not on blood thinners    Denies significant ETOH intake   Does not smoke   Lab Results  Component Value Date   SARSCOV2NAA Not Detected 07/31/2022    Regarding pertinent Chronic problems:    Pericardial effusion  - last echo  Recent Results (from the past 57846 hour(s))  ECHOCARDIOGRAM COMPLETE   Collection Time: 03/05/23  8:50 AM  Result Value   S' Lateral 2.50   AV Area VTI 1.82   AV Mean grad 4.0   AV Area mean vel 1.82   Area-P 1/2 5.54   AR max vel 1.63   AV Peak grad 7.6   Ao pk vel 1.38   Est EF 55 - 60%   Narrative      ECHOCARDIOGRAM REPORT      1. Left ventricular ejection fraction, by estimation, is 55 to 60%. Left ventricular ejection fraction by 3D volume is 59 %. The left ventricle has normal function. The left ventricle has no regional wall motion abnormalities. Left ventricular  diastolic  parameters are consistent with Grade I diastolic dysfunction (impaired relaxation).  2. Right ventricular systolic function is mildly reduced. The right ventricular size is normal. There is normal pulmonary artery systolic pressure. The estimated right ventricular systolic pressure is 15.1 mmHg.  3. A small pericardial effusion is present. The pericardial effusion is circumferential. There is no evidence of cardiac tamponade.  4. The mitral valve is abnormal. Trivial mitral valve regurgitation.  5. The aortic valve is tricuspid. Aortic valve regurgitation is not visualized.  6. The inferior vena cava is normal in size with greater than 50% respiratory variability, suggesting right atrial pressure of 3 mmHg.            Hypothyroidism:   Lab Results  Component Value Date   TSH 25.844 (H) 04/21/2023   T4TOTAL 6.2 04/21/2023   on synthroid    Cancer: known Lung cancer followed by Dr. Arbutus Ped sp chemo and radiation    While in ER: Clinical Course as of 05/09/23 2147  Sat May 09, 2023  1928 Patient declining chest x-ray, she said she has had some wheezing going on since February. [MB]  2131 I messaged Dr. [facility] who will consult on the patient tomorrow.  Discussed with Dr. Adela Glimpse [facility] who will admit the  patient.  I updated family on the plan and they are comfortable with being admitted [MB]    Clinical Course User Index [MB] Terrilee Files, MD       Lab Orders         Urine Culture         Culture, blood (routine x 2)         Urinalysis, Routine w reflex microscopic -Urine, Clean Catch         Comprehensive metabolic panel         CBC with Differential         I-Stat Lactic Acid       CTabd/pelvis -  6.1 x 5.4 cm area of heterogeneous hypoenhancement in the posterior mid to lower right kidney with increased appearance of central liquefaction necrosis from 8 days ago Infection with potentially a developing renal abscess and tumor could both  have this appearance, Stable 2.8 x 2.3 cm centrally necrotic retrocaval lymph node at the level of the right kidney, again partially effacing the IVC and displacing the right renal artery slightly anteriorly without   3 lung nodules in the lower lobes, largest 6 mm in the right lower lobe, concerning for metastases.    Following Medications were ordered in ER: Medications  morphine (PF) 4 MG/ML injection 4 mg (4 mg Intravenous Not Given 05/09/23 1930)  ondansetron (ZOFRAN) injection 4 mg (0 mg Intravenous Hold 05/09/23 1942)  cefTRIAXone (ROCEPHIN) 2 g in sodium chloride 0.9 % 100 mL IVPB (2 g Intravenous New Bag/Given 05/09/23 2107)  sodium chloride 0.9 % bolus 500 mL (0 mLs Intravenous Stopped 05/09/23 2025)  iohexol (OMNIPAQUE) 300 MG/ML solution 100 mL (100 mLs Intravenous Contrast Given 05/09/23 1950)  morphine (PF) 4 MG/ML injection 4 mg (4 mg Intravenous Given 05/09/23 1941)    __________________________________________ ER Provider Called:     Urology   Dr.PACE They Recommend admit to medicine   Will see in AM        ED Triage Vitals  Encounter Vitals Group     BP 05/09/23 1825 (!) 189/95     Systolic BP Percentile --      Diastolic BP Percentile --      Pulse Rate 05/09/23 1825 79     Resp 05/09/23 1825 16     Temp 05/09/23 1825 98.2 F (36.8 C)     Temp Source 05/09/23 1825 Oral     SpO2 05/09/23 1825 100 %     Weight 05/09/23 1828 121 lb 12.8 oz (55.2 kg)     Height 05/09/23 1828 5\' 3"  (1.6 m)     Head Circumference --      Peak Flow --      Pain Score 05/09/23 1827 3     Pain Loc --      Pain Education --      Exclude from Growth Chart --   GNFA(21)@     _________________________________________ Significant initial  Findings: Abnormal Labs Reviewed  URINALYSIS, ROUTINE W REFLEX MICROSCOPIC - Abnormal; Notable for the following components:      Result Value   Color, Urine RED (*)    Specific Gravity, Urine 1.004 (*)    Hgb urine dipstick MODERATE (*)     Protein, ur 100 (*)    Bacteria, UA RARE (*)    All other components within normal limits  COMPREHENSIVE METABOLIC PANEL - Abnormal; Notable for the following components:   Creatinine, Ser 1.23 (*)    Calcium 8.7 (*)  GFR, Estimated 47 (*)    All other components within normal limits  CBC WITH DIFFERENTIAL/PLATELET - Abnormal; Notable for the following components:   RBC 3.77 (*)    Hemoglobin 11.4 (*)    Lymphs Abs 0.4 (*)    All other components within normal limits    ECG: Ordered   The recent clinical data is shown below. Vitals:   05/09/23 1825 05/09/23 1828 05/09/23 2112  BP: (!) 189/95  (!) 186/94  Pulse: 79  78  Resp: 16  12  Temp: 98.2 F (36.8 C)    TempSrc: Oral    SpO2: 100%  97%  Weight:  55.2 kg   Height:  5\' 3"  (1.6 m)     WBC     Component Value Date/Time   WBC 8.3 05/09/2023 1900   LYMPHSABS 0.4 (L) 05/09/2023 1900   MONOABS 0.8 05/09/2023 1900   EOSABS 0.2 05/09/2023 1900   BASOSABS 0.0 05/09/2023 1900    Lactic Acid, Venous    Component Value Date/Time   LATICACIDVEN 0.5 05/09/2023 1943     Procalcitonin   Ordered      UA hematuria   Urine analysis:    Component Value Date/Time   COLORURINE RED (A) 05/09/2023 1835   APPEARANCEUR CLEAR 05/09/2023 1835   LABSPEC 1.004 (L) 05/09/2023 1835   PHURINE 6.0 05/09/2023 1835   GLUCOSEU NEGATIVE 05/09/2023 1835   HGBUR MODERATE (A) 05/09/2023 1835   BILIRUBINUR NEGATIVE 05/09/2023 1835   KETONESUR NEGATIVE 05/09/2023 1835   PROTEINUR 100 (A) 05/09/2023 1835   NITRITE NEGATIVE 05/09/2023 1835   LEUKOCYTESUR NEGATIVE 05/09/2023 1835    ABX started Antibiotics Given (last 72 hours)     Date/Time Action Medication Dose Rate   05/09/23 2107 New Bag/Given   cefTRIAXone (ROCEPHIN) 2 g in sodium chloride 0.9 % 100 mL IVPB 2 g 200 mL/hr      __________________________________________________ Recent Labs  Lab 05/09/23 1900  NA 135  K 3.7  CO2 23  GLUCOSE 90  BUN 23  CREATININE 1.23*   CALCIUM 8.7*    Cr   Up from baseline see below Lab Results  Component Value Date   CREATININE 1.23 (H) 05/09/2023   CREATININE 0.95 04/21/2023   CREATININE 0.77 03/05/2023    Recent Labs  Lab 05/09/23 1900  AST 21  ALT 19  ALKPHOS 56  BILITOT 0.3  PROT 7.4  ALBUMIN 3.6   Lab Results  Component Value Date   CALCIUM 8.7 (L) 05/09/2023    Plt: Lab Results  Component Value Date   PLT 326 05/09/2023    Recent Labs  Lab 05/09/23 1900  WBC 8.3  NEUTROABS 6.8  HGB 11.4*  HCT 36.0  MCV 95.5  PLT 326    HG/HCT  stable,      Component Value Date/Time   HGB 11.4 (L) 05/09/2023 1900   HGB 11.3 (L) 04/21/2023 1336   HCT 36.0 05/09/2023 1900   MCV 95.5 05/09/2023 1900   __________________________ Hospitalist was called for admission for hematuria due to pyelonephritis vs renal mass   The following Work up has been ordered so far:  Orders Placed This Encounter  Procedures   Urine Culture   Culture, blood (routine x 2)   CT ABDOMEN PELVIS W CONTRAST   Urinalysis, Routine w reflex microscopic -Urine, Clean Catch   Comprehensive metabolic panel   CBC with Differential   Consult to hospitalist   I-Stat Lactic Acid     OTHER Significant initial  Findings:  labs showing:     DM  labs:  HbA1C: No results for input(s): "HGBA1C" in the last 8760 hours.     CBG (last 3)  No results for input(s): "GLUCAP" in the last 72 hours.        Cultures: No results found for: "SDES", "SPECREQUEST", "CULT", "REPTSTATUS"   Radiological Exams on Admission: CT ABDOMEN PELVIS W CONTRAST  Result Date: 05/09/2023 CLINICAL DATA:  Right-sided abdominal pain for 6 weeks with hematuria. Known left hilar/perihilar bronchogenic carcinoma diagnosed with bronchoscopy 08/04/2022. EXAM: CT ABDOMEN AND PELVIS WITH CONTRAST TECHNIQUE: Multidetector CT imaging of the abdomen and pelvis was performed using the standard protocol following bolus administration of intravenous contrast.  RADIATION DOSE REDUCTION: This exam was performed according to the departmental dose-optimization program which includes automated exposure control, adjustment of the mA and/or kV according to patient size and/or use of iterative reconstruction technique. CONTRAST:  OMNIPAQUE IOHEXOL 300 MG/ML  SOLN COMPARISON:  PET-CT 08/13/2022, CT abdomen pelvis with IV contrast last week 05/01/2023. Also chest CT with contrast 03/05/2023. FINDINGS: Lower chest: There is scarring and tenting along the left hemidiaphragm. There is trace left subpulmonic loculated pleural fluid. There is a new 3 mm left lower lobe nodule posterior to the left hemidiaphragm on 6:25 and a new 3 mm nodule in the anterolateral left base on the first image, concerning for metastases. There is a 6 mm right lower solid nodule posteriorly on 6:21 also new from the PET-CT and compared with the chest CT from 03/05/2023 and also most likely a metastasis. A densely calcified right lower lobe granuloma is again seen, scattered linear scar-like opacities in the right base. The heart is slightly enlarged. Small pericardial effusion collection inferior to the heart. Small hiatal hernia. Hepatobiliary: The liver is mildly steatotic including with focal periligamentous fat in segment 4 B. No metastasis is seen. Unremarkable gallbladder, bile ducts. Pancreas: No mass enhancement, ductal dilatation or inflammatory changes. Spleen: No mass enhancement.  No splenomegaly. Adrenals/Urinary Tract: There is no adrenal mass. No focal left renal mass enhancement. Both renal arteries and veins opacify well. In the posterior mid to lower right kidney, again noted is a 6.1 x 5.4 cm area of heterogeneous hypoenhancement with adjacent inflammatory reaction, little changed in overall size but with increased appearance of central liquefaction necrosis. Mild urothelial thickening in the adjacent right renal pelvis appears similar to 8 days ago and there is again lack of  excretion from the involved parenchyma into the calices, which are effaced within the abnormality. Infection with potentially a developing renal abscess and tumor could both have this appearance, although infection is favored given that this was not present on 03/05/2023. Further supporting an infectious etiology, there is again noted faint hypoenhancement over portions of the posterior mid left kidney best seen on the delayed images and likely indicating pyelonephritis. There is no evidence of urinary stones or obstruction. The bladder is unremarkable for the degree of distention. Stomach/Bowel: No dilatation or overt wall thickening. The gastric wall is contracted. An appendix is not seen in this patient. There is moderate to severe fecal stasis in the ascending colon, moderate fecal stasis in the transverse segment and uncomplicated scattered left-sided diverticula. Vascular/Lymphatic: Again noted is a 2.8 x 2.3 cm centrally necrotic retrocaval lymph node at the level of the right kidney, again partially effacing the IVC and displacing the right renal artery slightly anteriorly without effacement. No further adenopathy is evident. There is aortic atherosclerosis. Accessory circumaortic left renal  vein. Reproductive: Uterus and bilateral adnexa are unremarkable. Other: No free fluid, free air or incarcerated hernias. Musculoskeletal: Levoscoliosis and degenerative change lumbar spine. No bone metastasis is seen. Mild hip DJD. 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 IMPRESSION: 1. 6.1 x 5.4 cm area of heterogeneous hypoenhancement in the posterior mid to lower right kidney with increased appearance of central liquefaction necrosis from 8 days ago. There is again lack of excretion from the involved parenchyma into the calices, and mild urothelial thickening in the adjacent right renal pelvis. Infection with potentially a developing renal abscess and tumor could both have  this appearance, with infection favored given that this was not present on 03/05/2023. 2. Further supporting an infectious etiology, there is again noted faint hypoenhancement over portions of the posterior mid left kidney best seen on the delayed images and likely indicating pyelonephritis. 3. Stable 2.8 x 2.3 cm centrally necrotic retrocaval lymph node at the level of the right kidney, again partially effacing the IVC and displacing the right renal artery slightly anteriorly without effacement. 4. 3 lung nodules in the lower lobes, largest 6 mm in the right lower lobe, concerning for metastases. 5. Constipation and diverticulosis. 6. Aortic atherosclerosis. Aortic Atherosclerosis (ICD10-I70.0). Electronically Signed   By: Almira Bar M.D.   On: 05/09/2023 20:41   _______________________________________________________________________________________________________ Latest  Blood pressure (!) 186/94, pulse 78, temperature 98.2 F (36.8 C), temperature source Oral, resp. rate 12, height 5\' 3"  (1.6 m), weight 55.2 kg, SpO2 97%.   Vitals  labs and radiology finding personally reviewed  Review of Systems:    Pertinent positives include:  abdominal pain  Constitutional:  No weight loss, night sweats, Fevers, chills, fatigue, weight loss  HEENT:  No headaches, Difficulty swallowing,Tooth/dental problems,Sore throat,  No sneezing, itching, ear ache, nasal congestion, post nasal drip,  Cardio-vascular:  No chest pain, Orthopnea, PND, anasarca, dizziness, palpitations.no Bilateral lower extremity swelling  GI:  No heartburn, indigestion, , nausea, vomiting, diarrhea, change in bowel habits, loss of appetite, melena, blood in stool, hematemesis Resp:  no shortness of breath at rest. No dyspnea on exertion, No excess mucus, no productive cough, No non-productive cough, No coughing up of blood.No change in color of mucus.No wheezing. Skin:  no rash or lesions. No jaundice GU:  no dysuria, change in  color of urine, no urgency or frequency. No straining to urinate.  No flank pain.  Musculoskeletal:  No joint pain or no joint swelling. No decreased range of motion. No back pain.  Psych:  No change in mood or affect. No depression or anxiety. No memory loss.  Neuro: no localizing neurological complaints, no tingling, no weakness, no double vision, no gait abnormality, no slurred speech, no confusion  All systems reviewed and apart from HOPI all are negative _______________________________________________________________________________________________ Past Medical History:   Past Medical History:  Diagnosis Date   Depression    Insomnia    Lung cancer (HCC) 07/25/2022   Osteoporosis    Pulmonary nodule    Thyroid nodule       Past Surgical History:  Procedure Laterality Date   ANKLE SURGERY Right    BREAST BIOPSY Right    benign 2013   BRONCHIAL BIOPSY  08/04/2022   Procedure: BRONCHIAL BIOPSIES;  Surgeon: Leslye Peer, MD;  Location[facility] ENDOSCOPY;  Service: Cardiopulmonary;;   BRONCHIAL BRUSHINGS  08/04/2022   Procedure: BRONCHIAL BRUSHINGS;  Surgeon: Leslye Peer, MD;  Location: [facility] ENDOSCOPY;  Service: Cardiopulmonary;;  HEMOSTASIS CONTROL  08/04/2022   Procedure: HEMOSTASIS CONTROL;  Surgeon: Leslye Peer, MD;  Location: [facility] ENDOSCOPY;  Service: Cardiopulmonary;;   IR IMAGING GUIDED PORT INSERTION  09/04/2022   repair of a torn meniscus Left 05/2018   VIDEO BRONCHOSCOPY Left 08/04/2022   Procedure: VIDEO BRONCHOSCOPY WITH FLUORO;  Surgeon: Leslye Peer, MD;  Location: [facility] ENDOSCOPY;  Service: Cardiopulmonary;  Laterality: Left;    Social History:  Ambulatory   independently     reports that she quit smoking about 12 years ago. Her smoking use included cigarettes. She started smoking about 53 years ago. She has a 41 pack-year smoking history. She has never used smokeless tobacco. She reports current alcohol use. She reports that she does not use drugs.      Family History:   Family History  Problem Relation Age of Onset   Alzheimer's disease Mother    Hypertension Father    Stroke Father    Hyperlipidemia Brother    Breast cancer Neg Hx    ______________________________________________________________________________________________ Allergies: Allergies  Allergen Reactions   Compazine [Prochlorperazine Edisylate]     Neck hyperextends     Prior to Admission medications   Medication Sig Start Date End Date Taking? Authorizing Provider  albuterol (PROVENTIL) (2.5 MG/3ML) 0.083% nebulizer solution Take 3 mLs (2.5 mg total) by nebulization every 6 (six) hours as needed for wheezing or shortness of breath. 09/22/22   Leslye Peer, MD  albuterol (VENTOLIN HFA) 108 (90 Base) MCG/ACT inhaler Inhale 2 puffs into the lungs every 6 (six) hours as needed for wheezing or shortness of breath. 08/28/22   Byrum, Les Pou, MD  ALPRAZolam Prudy Feeler) 0.5 MG tablet Take 1 tablet (0.5 mg total) by mouth at bedtime as needed for anxiety or sleep. 08/25/22   [name], MD  docusate sodium (COLACE) 100 MG capsule Take 100 mg by mouth every other day.    [provider]  eszopiclone (LUNESTA) 2 MG TABS tablet Take 1 tablet (2 mg total) by mouth at bedtime as needed for sleep. Take immediately before bedtime 02/27/23   [name], MD  HYDROcodone bit-homatropine [facility]) 5-1.5 MG/5ML syrup Take 5 mLs by mouth every 6 (six) hours as needed for cough. 04/28/23   Heilingoetter, Cassandra L, PA-C  levothyroxine (SYNTHROID) 50 MCG tablet Take 1 tablet (50 mcg total) by mouth daily before breakfast. 04/28/23   Si Gaul, MD    ___________________________________________________________________________________________________ Physical Exam:    05/09/2023    9:12 PM 05/09/2023    6:28 PM 05/09/2023    6:25 PM  Vitals with BMI  Height  5\' 3"    Weight  121 lbs 13 oz   BMI  21.58   Systolic 186  189  Diastolic 94  95  Pulse 78  79      1. General:  in No  Acute distress   Chronically ill  -appearing 2. Psychological: Alert and   Oriented 3. Head/ENT:    Dry Mucous Membranes                          Head Non traumatic, neck supple                      Poor Dentition 4. SKIN:  decreased Skin turgor,  Skin clean Dry and intact no rash    5. Heart: Regular rate and rhythm no  Murmur, no Rub or gallop 6. Lungs:  no wheezes or crackles   7. Abdomen: Soft, suprapubic tender, Non distended  bowel sounds present 8. Lower extremities: no clubbing, cyanosis, no  edema 9. Neurologically Grossly intact, moving all 4 extremities equally   10. MSK: Normal range of motion    Chart has been reviewed  ______________________________________________________________________________________________  Assessment/Plan  72 y.o. female with medical history significant of LUng Cancer, pericardial effusion, hypothyroidism, renal mass   Admitted for  hematuria due to pyelonephritis vs renal mass  Present on Admission:  Hematuria  Hypothyroidism due to medication  Non-small cell cancer of left lung (HCC)  Pericardial effusion  Pulmonary nodules  AKI (acute kidney injury) (HCC)     Hematuria CT showing changes which could be infectious versus malignant Appreciate urology consult Covered with antibiotics for now and continue to monitor continue Rocephin Send urine culture  Hypothyroidism due to medication - Check TSH continue home medications Synthroid at 50 mcg po q day   Non-small cell cancer of left lung (HCC) Chronic followed up as an outpatient with oncology  Pericardial effusion Last echogram done on 30 September showed small effusion around the heart stable  Port-A-Cath in place Chronic stable  Pulmonary nodules To be followed up with oncologist as an outpatient possibly slowly increasing defer to oncologist regarding significance known history of lung cancer  AKI (acute kidney injury) (HCC) Gently  rehydrate update your electrolytes monitor for any obstruction strict I's and O's   Other plan as per orders.  DVT prophylaxis:  SCD        Code Status:    Code Status: Prior FULL CODE  as per patient   I had personally discussed CODE STATUS with patient and family  ACP  has been reviewed     Family Communication:   Family  at  Bedside  plan of care was discussed  with  Husband,  Diet    Disposition Plan:       To home once workup is complete and patient is stable   Following barriers for discharge:                                                         Electrolytes corrected                               Anemia corrected h/H stable                             Pain controlled with PO medications                               Afebrile, white count improving able to transition to PO antibiotics                             Will need to be able to tolerate PO                            Will likely need home health, home O2, set up  Will need consultants to evaluate patient prior to discharge       Consult Orders  (From admission, onward)           Start     Ordered   05/09/23 2055  Consult to hospitalist  Once       Provider:  (Not yet assigned)  Question Answer Comment  Place call to: [facility]   Reason for Consult Admit      05/09/23 2054                               Consults called:     Treatment Team:  Noel Christmas, MD Email oncology that pt has been admitted, if have questions pls contact oncology on call  in AM Admission status:  ED Disposition     ED Disposition  Admit   Condition  --   Comment  Hospital Area: [facility] [[id number]]  Level of Care: Telemetry [5]  Admit to tele based on following criteria: Other see comments  Comments: infection  May place patient in observation at [facility] or [facility] if equivalent level of care is available:: No  Covid Evaluation: Asymptomatic - no  recent exposure (last 10 days) testing not required  Diagnosis: Hematuria [841660]  Admitting Physician: Therisa Doyne [[id number]]  Attending Physician: Therisa Doyne [[id number]]          Obs    Level of care     tele  For 12H      Nicholad Kautzman 05/09/2023, 11:02 PM    [facility]     after 2 AM please page floor coverage PA If 7AM-7PM, please contact the day team taking care of the patient using [email or web address]

## 2023-05-09 NOTE — ED Provider Notes (Signed)
[place] EMERGENCY DEPARTMENT AT [facility] Provider Note   CSN: [id number] Arrival date & time: 05/09/23  1815     History {Add pertinent medical, surgical, social history, OB history to HPI:1} Chief Complaint  Patient presents with   Abdominal Pain    Evelyn Jordan is a 72 y.o. female.  She is here with new hematuria.  She has been having ongoing right-sided abdominal pain for 6 to 8 weeks.  They treated her for a possible ulcer which did not help.  She had a CAT scan done 11/22 that showed abnormalities in the right kidney and some abnormal lymph nodes.  She has a follow-up appointment with urology in 3 days.  She was on a cruise in the [place] when they aborted the cruise and she came home due to worsening abdominal pain. she began experiencing some worse pain in her abdomen and difficulty passing urine today followed by passing what sounds like a large clot.  Symptoms happened again tonight so she elected to come here for further evaluation.  No fever.  She has a chronic cough she attributes to her lung cancer.  No vomiting or diarrhea.  She has been constipated.  No fevers or chills.   Abdominal Pain Pain location:  RLQ and RUQ Pain quality: aching   Pain severity:  Moderate Onset quality:  Gradual Duration:  8 weeks Timing:  Intermittent Progression:  Waxing and waning Relieved by:  Nothing Ineffective treatments:  Acetaminophen Associated symptoms: constipation, cough, hematuria and shortness of breath   Associated symptoms: no chest pain, no chills, no diarrhea, no dysuria, no fever, no hematemesis, no nausea, no vaginal bleeding and no vomiting        Home Medications Prior to Admission medications   Medication Sig Start Date End Date Taking? Authorizing Provider  albuterol (PROVENTIL) (2.5 MG/3ML) 0.083% nebulizer solution Take 3 mLs (2.5 mg total) by nebulization every 6 (six) hours as needed for wheezing or shortness of breath. 09/22/22   Leslye Peer, MD  albuterol (VENTOLIN HFA) 108 (90 Base) MCG/ACT inhaler Inhale 2 puffs into the lungs every 6 (six) hours as needed for wheezing or shortness of breath. 08/28/22   Byrum, Les Pou, MD  ALPRAZolam Prudy Feeler) 0.5 MG tablet Take 1 tablet (0.5 mg total) by mouth at bedtime as needed for anxiety or sleep. 08/25/22   [name], MD  docusate sodium (COLACE) 100 MG capsule Take 100 mg by mouth every other day.    [provider]  eszopiclone (LUNESTA) 2 MG TABS tablet Take 1 tablet (2 mg total) by mouth at bedtime as needed for sleep. Take immediately before bedtime 02/27/23   [name], MD  HYDROcodone bit-homatropine [facility]) 5-1.5 MG/5ML syrup Take 5 mLs by mouth every 6 (six) hours as needed for cough. 04/28/23   Heilingoetter, Cassandra L, PA-C  levothyroxine (SYNTHROID) 50 MCG tablet Take 1 tablet (50 mcg total) by mouth daily before breakfast. 04/28/23   Si Gaul, MD      Allergies    Compazine [prochlorperazine edisylate]    Review of Systems   Review of Systems  Constitutional:  Negative for chills and fever.  Respiratory:  Positive for cough and shortness of breath.   Cardiovascular:  Negative for chest pain.  Gastrointestinal:  Positive for abdominal pain and constipation. Negative for diarrhea, hematemesis, nausea and vomiting.  Genitourinary:  Positive for hematuria. Negative for dysuria and vaginal bleeding.    Physical Exam Updated Vital Signs BP Marland Kitchen)  189/95   Pulse 79   Temp 98.2 F (36.8 C) (Oral)   Resp 16   Ht 5\' 3"  (1.6 m)   Wt 55.2 kg   SpO2 100%   BMI 21.58 kg/m  Physical Exam Vitals and nursing note reviewed.  Constitutional:      General: She is not in acute distress.    Appearance: Normal appearance. She is well-developed.  HENT:     Head: Normocephalic and atraumatic.  Eyes:     Conjunctiva/sclera: Conjunctivae normal.  Cardiovascular:     Rate and Rhythm: Normal rate and regular rhythm.     Heart sounds: No murmur  heard. Pulmonary:     Effort: Pulmonary effort is normal. No respiratory distress.     Breath sounds: Rhonchi present.  Abdominal:     Palpations: Abdomen is soft.     Tenderness: There is no abdominal tenderness. There is no guarding or rebound.  Musculoskeletal:        General: No deformity.     Cervical back: Neck supple.  Skin:    General: Skin is warm and dry.     Capillary Refill: Capillary refill takes less than 2 seconds.  Neurological:     General: No focal deficit present.     Mental Status: She is alert.     Motor: No weakness.     ED Results / Procedures / Treatments   Labs (all labs ordered are listed, but only abnormal results are displayed) Labs Reviewed  URINALYSIS, ROUTINE W REFLEX MICROSCOPIC    EKG None  Radiology No results found.  Procedures Procedures  {Document cardiac monitor, telemetry assessment procedure when appropriate:1}  Medications Ordered in ED Medications  morphine (PF) 4 MG/ML injection 4 mg (has no administration in time range)  ondansetron (ZOFRAN) injection 4 mg (has no administration in time range)  sodium chloride 0.9 % bolus 500 mL (has no administration in time range)    ED Course/ Medical Decision Making/ A&P   {   Click here for ABCD2, HEART and other calculatorsREFRESH Note before signing :1}                              Medical Decision Making Amount and/or Complexity of Data Reviewed Labs: ordered. Radiology: ordered.  Risk Prescription drug management.   This patient complains of ***; this involves an extensive number of treatment Options and is a complaint that carries with it a high risk of complications and morbidity. The differential includes ***  I ordered, reviewed and interpreted labs, which included *** I ordered medication *** and reviewed PMP when indicated. I ordered imaging studies which included *** and I independently    visualized and interpreted imaging which showed *** Additional history  obtained from *** Previous records obtained and reviewed *** I consulted *** and discussed lab and imaging findings and discussed disposition.  Cardiac monitoring reviewed, *** Social determinants considered, *** Critical Interventions: ***  After the interventions stated above, I reevaluated the patient and found *** Admission and further testing considered, ***   {Document critical care time when appropriate:1} {Document review of labs and clinical decision tools ie heart score, Chads2Vasc2 etc:1}  {Document your independent review of radiology images, and any outside records:1} {Document your discussion with family members, caretakers, and with consultants:1} {Document social determinants of health affecting pt's care:1} {Document your decision making why or why not admission, treatments were needed:1} Final Clinical Impression(s) / ED Diagnoses  Final diagnoses:  None    Rx / DC Orders ED Discharge Orders     None

## 2023-05-09 NOTE — ED Notes (Signed)
..ED TO INPATIENT HANDOFF REPORT  ED Nurse Name and Phone #: Buckner Malta [phone]  S Name/Age/Gender Evelyn Jordan 72 y.o. female Room/Bed: WA21/WA21  Code Status   Code Status: Full Code  Home/SNF/Other Home Patient oriented to: self, place, time, and situation Is this baseline? Yes   Triage Complete: Triage complete  Chief Complaint Hematuria [R31.9]  Triage Note Worsening right sided abdominal pain x 6 weeks. Reports blood in urine this AM. Denies fevers and dysuria. Patient had CT last week and was to follow up with urology, but couldn't get seen until next week.    Allergies Allergies  Allergen Reactions   Compazine [Prochlorperazine Edisylate]     Neck hyperextends    Level of Care/Admitting Diagnosis ED Disposition     ED Disposition  Admit   Condition  --   Comment  Hospital Area: [facility] [[id number]]  Level of Care: Telemetry [5]  Admit to tele based on following criteria: Other see comments  Comments: infection  May place patient in observation at [facility] or [facility] if equivalent level of care is available:: No  Covid Evaluation: Asymptomatic - no recent exposure (last 10 days) testing not required  Diagnosis: Hematuria [751025]  Admitting Physician: Therisa Doyne [[id number]]  Attending Physician: Therisa Doyne [[id number]]          B Medical/Surgery History Past Medical History:  Diagnosis Date   Depression    Insomnia    Lung cancer (HCC) 07/25/2022   Osteoporosis    Pulmonary nodule    Thyroid nodule    Past Surgical History:  Procedure Laterality Date   ANKLE SURGERY Right    BREAST BIOPSY Right    benign 2013   BRONCHIAL BIOPSY  08/04/2022   Procedure: BRONCHIAL BIOPSIES;  Surgeon: Leslye Peer, MD;  Location: [facility] ENDOSCOPY;  Service: Cardiopulmonary;;   BRONCHIAL BRUSHINGS  08/04/2022   Procedure: BRONCHIAL BRUSHINGS;  Surgeon: Leslye Peer, MD;  Location: [facility] ENDOSCOPY;  Service:  Cardiopulmonary;;   HEMOSTASIS CONTROL  08/04/2022   Procedure: HEMOSTASIS CONTROL;  Surgeon: Leslye Peer, MD;  Location: [facility] ENDOSCOPY;  Service: Cardiopulmonary;;   IR IMAGING GUIDED PORT INSERTION  09/04/2022   repair of a torn meniscus Left 05/2018   VIDEO BRONCHOSCOPY Left 08/04/2022   Procedure: VIDEO BRONCHOSCOPY WITH FLUORO;  Surgeon: Leslye Peer, MD;  Location: [facility] ENDOSCOPY;  Service: Cardiopulmonary;  Laterality: Left;     A IV Location/Drains/Wounds Patient Lines/Drains/Airways Status     Active Line/Drains/Airways     Name Placement date Placement time Site Days   Implanted Port 09/04/22 Right Chest 09/04/22  1126  Chest  247   Peripheral IV 05/09/23 20 G 1" Right Antecubital 05/09/23  1930  Antecubital  less than 1            Intake/Output Last 24 hours  Intake/Output Summary (Last 24 hours) at 05/09/2023 2209 Last data filed at 05/09/2023 2025 Gross per 24 hour  Intake 500 ml  Output --  Net 500 ml    Labs/Imaging Results for orders placed or performed during the hospital encounter of 05/09/23 (from the past 48 hour(s))  Urinalysis, Routine w reflex microscopic -Urine, Clean Catch     Status: Abnormal   Collection Time: 05/09/23  6:35 PM  Result Value Ref Range   Color, Urine RED (A) YELLOW   APPearance CLEAR CLEAR   Specific Gravity, Urine 1.004 (L) 1.005 - 1.030   pH 6.0 5.0 - 8.0   Glucose,  UA NEGATIVE NEGATIVE mg/dL   Hgb urine dipstick MODERATE (A) NEGATIVE   Bilirubin Urine NEGATIVE NEGATIVE   Ketones, ur NEGATIVE NEGATIVE mg/dL   Protein, ur 213 (A) NEGATIVE mg/dL   Nitrite NEGATIVE NEGATIVE   Leukocytes,Ua NEGATIVE NEGATIVE   RBC / HPF >50 0 - 5 RBC/hpf   WBC, UA 21-50 0 - 5 WBC/hpf   Bacteria, UA RARE (A) NONE SEEN   Squamous Epithelial / HPF 0-5 0 - 5 /HPF    Comment: Performed at [facility], [address] [place], [place] [address]  Comprehensive metabolic panel     Status: Abnormal   Collection Time:  05/09/23  7:00 PM  Result Value Ref Range   Sodium 135 135 - 145 mmol/L   Potassium 3.7 3.5 - 5.1 mmol/L   Chloride 102 98 - 111 mmol/L   CO2 23 22 - 32 mmol/L   Glucose, Bld 90 70 - 99 mg/dL    Comment: Glucose reference range applies only to samples taken after fasting for at least 8 hours.   BUN 23 8 - 23 mg/dL   Creatinine, Ser 8.46 (H) 0.44 - 1.00 mg/dL   Calcium 8.7 (L) 8.9 - 10.3 mg/dL   Total Protein 7.4 6.5 - 8.1 g/dL   Albumin 3.6 3.5 - 5.0 g/dL   AST 21 15 - 41 U/L   ALT 19 0 - 44 U/L   Alkaline Phosphatase 56 38 - 126 U/L   Total Bilirubin 0.3 <1.2 mg/dL   GFR, Estimated 47 (L) >60 mL/min    Comment: (NOTE) Calculated using the CKD-EPI Creatinine Equation (2021)    Anion gap 10 5 - 15    Comment: Performed at [facility], [address], [place], [place] [address]  CBC with Differential     Status: Abnormal   Collection Time: 05/09/23  7:00 PM  Result Value Ref Range   WBC 8.3 4.0 - 10.5 K/uL   RBC 3.77 (L) 3.87 - 5.11 MIL/uL   Hemoglobin 11.4 (L) 12.0 - 15.0 g/dL   HCT 28.4 13.2 - 44.0 %   MCV 95.5 80.0 - 100.0 fL   MCH 30.2 26.0 - 34.0 pg   MCHC 31.7 30.0 - 36.0 g/dL   RDW 10.2 72.5 - 36.6 %   Platelets 326 150 - 400 K/uL   nRBC 0.0 0.0 - 0.2 %   Neutrophils Relative % 82 %   Neutro Abs 6.8 1.7 - 7.7 K/uL   Lymphocytes Relative 5 %   Lymphs Abs 0.4 (L) 0.7 - 4.0 K/uL   Monocytes Relative 10 %   Monocytes Absolute 0.8 0.1 - 1.0 K/uL   Eosinophils Relative 2 %   Eosinophils Absolute 0.2 0.0 - 0.5 K/uL   Basophils Relative 1 %   Basophils Absolute 0.0 0.0 - 0.1 K/uL   Immature Granulocytes 0 %   Abs Immature Granulocytes 0.02 0.00 - 0.07 K/uL    Comment: Performed at [facility], [address] [place], [place] [address]  I-Stat Lactic Acid     Status: None   Collection Time: 05/09/23  7:43 PM  Result Value Ref Range   Lactic Acid, Venous 0.5 0.5 - 1.9 mmol/L   CT ABDOMEN PELVIS W CONTRAST  Result Date:  05/09/2023 CLINICAL DATA:  Right-sided abdominal pain for 6 weeks with hematuria. Known left hilar/perihilar bronchogenic carcinoma diagnosed with bronchoscopy 08/04/2022. EXAM: CT ABDOMEN AND PELVIS WITH CONTRAST TECHNIQUE: Multidetector CT imaging of the abdomen and pelvis  was performed using the standard protocol following bolus administration of intravenous contrast. RADIATION DOSE REDUCTION: This exam was performed according to the departmental dose-optimization program which includes automated exposure control, adjustment of the mA and/or kV according to patient size and/or use of iterative reconstruction technique. CONTRAST:  OMNIPAQUE IOHEXOL 300 MG/ML  SOLN COMPARISON:  PET-CT 08/13/2022, CT abdomen pelvis with IV contrast last week 05/01/2023. Also chest CT with contrast 03/05/2023. FINDINGS: Lower chest: There is scarring and tenting along the left hemidiaphragm. There is trace left subpulmonic loculated pleural fluid. There is a new 3 mm left lower lobe nodule posterior to the left hemidiaphragm on 6:25 and a new 3 mm nodule in the anterolateral left base on the first image, concerning for metastases. There is a 6 mm right lower solid nodule posteriorly on 6:21 also new from the PET-CT and compared with the chest CT from 03/05/2023 and also most likely a metastasis. A densely calcified right lower lobe granuloma is again seen, scattered linear scar-like opacities in the right base. The heart is slightly enlarged. Small pericardial effusion collection inferior to the heart. Small hiatal hernia. Hepatobiliary: The liver is mildly steatotic including with focal periligamentous fat in segment 4 B. No metastasis is seen. Unremarkable gallbladder, bile ducts. Pancreas: No mass enhancement, ductal dilatation or inflammatory changes. Spleen: No mass enhancement.  No splenomegaly. Adrenals/Urinary Tract: There is no adrenal mass. No focal left renal mass enhancement. Both renal arteries and veins opacify  well. In the posterior mid to lower right kidney, again noted is a 6.1 x 5.4 cm area of heterogeneous hypoenhancement with adjacent inflammatory reaction, little changed in overall size but with increased appearance of central liquefaction necrosis. Mild urothelial thickening in the adjacent right renal pelvis appears similar to 8 days ago and there is again lack of excretion from the involved parenchyma into the calices, which are effaced within the abnormality. Infection with potentially a developing renal abscess and tumor could both have this appearance, although infection is favored given that this was not present on 03/05/2023. Further supporting an infectious etiology, there is again noted faint hypoenhancement over portions of the posterior mid left kidney best seen on the delayed images and likely indicating pyelonephritis. There is no evidence of urinary stones or obstruction. The bladder is unremarkable for the degree of distention. Stomach/Bowel: No dilatation or overt wall thickening. The gastric wall is contracted. An appendix is not seen in this patient. There is moderate to severe fecal stasis in the ascending colon, moderate fecal stasis in the transverse segment and uncomplicated scattered left-sided diverticula. Vascular/Lymphatic: Again noted is a 2.8 x 2.3 cm centrally necrotic retrocaval lymph node at the level of the right kidney, again partially effacing the IVC and displacing the right renal artery slightly anteriorly without effacement. No further adenopathy is evident. There is aortic atherosclerosis. Accessory circumaortic left renal vein. Reproductive: Uterus and bilateral adnexa are unremarkable. Other: No free fluid, free air or incarcerated hernias. Musculoskeletal: Levoscoliosis and degenerative change lumbar spine. No bone metastasis is seen. Mild hip DJD. 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 IMPRESSION: 1. 6.1 x 5.4 cm area of  heterogeneous hypoenhancement in the posterior mid to lower right kidney with increased appearance of central liquefaction necrosis from 8 days ago. There is again lack of excretion from the involved parenchyma into the calices, and mild urothelial thickening in the adjacent right renal pelvis. Infection with potentially a developing renal abscess and tumor could both  have this appearance, with infection favored given that this was not present on 03/05/2023. 2. Further supporting an infectious etiology, there is again noted faint hypoenhancement over portions of the posterior mid left kidney best seen on the delayed images and likely indicating pyelonephritis. 3. Stable 2.8 x 2.3 cm centrally necrotic retrocaval lymph node at the level of the right kidney, again partially effacing the IVC and displacing the right renal artery slightly anteriorly without effacement. 4. 3 lung nodules in the lower lobes, largest 6 mm in the right lower lobe, concerning for metastases. 5. Constipation and diverticulosis. 6. Aortic atherosclerosis. Aortic Atherosclerosis (ICD10-I70.0). Electronically Signed   By: Almira Bar M.D.   On: 05/09/2023 20:41    Pending Labs Unresulted Labs (From admission, onward)     Start     Ordered   05/10/23 0500  Prealbumin  Tomorrow morning,   R        05/09/23 2143   05/09/23 2144  CK  Add-on,   AD        05/09/23 2143   05/09/23 2144  Magnesium  Add-on,   AD        05/09/23 2143   05/09/23 2144  Phosphorus  Add-on,   AD        05/09/23 2143   05/09/23 2144  Osmolality, urine  Once,   R        05/09/23 2143   05/09/23 2144  Osmolality  Add-on,   AD        05/09/23 2143   05/09/23 2144  Creatinine, urine, random  Once,   R        05/09/23 2143   05/09/23 2144  Sodium, urine, random  Once,   R        05/09/23 2143   05/09/23 2144  TSH  Add-on,   AD        05/09/23 2143   05/09/23 2144  Procalcitonin  Add-on,   AD       References:    Procalcitonin Lower Respiratory Tract  Infection AND Sepsis Procalcitonin Algorithm   05/09/23 2143   05/09/23 1901  Culture, blood (routine x 2)  BLOOD CULTURE X 2,   R (with STAT occurrences)      05/09/23 1901   05/09/23 1900  Urine Culture  Once,   URGENT       Question:  Indication  Answer:  Acute gross hematuria   05/09/23 1901   Signed and Held  Magnesium  Tomorrow morning,   R        Signed and Held   Signed and Held  Phosphorus  Tomorrow morning,   R        Signed and Held   Signed and Held  Comprehensive metabolic panel  Tomorrow morning,   R       Question:  Release to patient  Answer:  Immediate   Signed and Held   Signed and Held  CBC  Tomorrow morning,   R       Question:  Release to patient  Answer:  Immediate   Signed and Held            Vitals/Pain Today's Vitals   05/09/23 1828 05/09/23 2026 05/09/23 2112 05/09/23 2146  BP:   (!) 186/94 (!) 124/92  Pulse:   78 68  Resp:   12 14  Temp:      TempSrc:      SpO2:   97% 99%  Weight: 121 lb  12.8 oz (55.2 kg)     Height: 5\' 3"  (1.6 m)     PainSc:  2       Isolation Precautions No active isolations  Medications Medications  morphine (PF) 4 MG/ML injection 4 mg (4 mg Intravenous Not Given 05/09/23 1930)  ondansetron (ZOFRAN) injection 4 mg (0 mg Intravenous Hold 05/09/23 1942)  sodium chloride 0.9 % bolus 500 mL (0 mLs Intravenous Stopped 05/09/23 2025)  iohexol (OMNIPAQUE) 300 MG/ML solution 100 mL (100 mLs Intravenous Contrast Given 05/09/23 1950)  morphine (PF) 4 MG/ML injection 4 mg (4 mg Intravenous Given 05/09/23 1941)  cefTRIAXone (ROCEPHIN) 2 g in sodium chloride 0.9 % 100 mL IVPB (2 g Intravenous New Bag/Given 05/09/23 2107)    Mobility walks     Focused Assessments     R Recommendations: See Admitting Provider Note  Report given to:   Additional Notes:

## 2023-05-09 NOTE — Assessment & Plan Note (Signed)
Last echogram done on 30 September showed small effusion around the heart stable

## 2023-05-09 NOTE — ED Triage Notes (Signed)
Worsening right sided abdominal pain x 6 weeks. Reports blood in urine this AM. Denies fevers and dysuria. Patient had CT last week and was to follow up with urology, but couldn't get seen until next week.

## 2023-05-09 NOTE — Assessment & Plan Note (Signed)
Chronic followed up as an outpatient with oncology

## 2023-05-10 ENCOUNTER — Other Ambulatory Visit: Payer: Self-pay

## 2023-05-10 DIAGNOSIS — D649 Anemia, unspecified: Secondary | ICD-10-CM | POA: Diagnosis present

## 2023-05-10 DIAGNOSIS — I3139 Other pericardial effusion (noninflammatory): Secondary | ICD-10-CM | POA: Diagnosis present

## 2023-05-10 DIAGNOSIS — Z79890 Hormone replacement therapy (postmenopausal): Secondary | ICD-10-CM | POA: Diagnosis not present

## 2023-05-10 DIAGNOSIS — C3492 Malignant neoplasm of unspecified part of left bronchus or lung: Secondary | ICD-10-CM | POA: Diagnosis present

## 2023-05-10 DIAGNOSIS — N280 Ischemia and infarction of kidney: Secondary | ICD-10-CM | POA: Diagnosis present

## 2023-05-10 DIAGNOSIS — Z87891 Personal history of nicotine dependence: Secondary | ICD-10-CM | POA: Diagnosis not present

## 2023-05-10 DIAGNOSIS — N12 Tubulo-interstitial nephritis, not specified as acute or chronic: Secondary | ICD-10-CM | POA: Diagnosis present

## 2023-05-10 DIAGNOSIS — I700 Atherosclerosis of aorta: Secondary | ICD-10-CM | POA: Diagnosis present

## 2023-05-10 DIAGNOSIS — R053 Chronic cough: Secondary | ICD-10-CM | POA: Diagnosis present

## 2023-05-10 DIAGNOSIS — R9341 Abnormal radiologic findings on diagnostic imaging of renal pelvis, ureter, or bladder: Secondary | ICD-10-CM | POA: Diagnosis not present

## 2023-05-10 DIAGNOSIS — T451X5A Adverse effect of antineoplastic and immunosuppressive drugs, initial encounter: Secondary | ICD-10-CM | POA: Diagnosis present

## 2023-05-10 DIAGNOSIS — Z888 Allergy status to other drugs, medicaments and biological substances status: Secondary | ICD-10-CM | POA: Diagnosis not present

## 2023-05-10 DIAGNOSIS — G4700 Insomnia, unspecified: Secondary | ICD-10-CM | POA: Diagnosis present

## 2023-05-10 DIAGNOSIS — E032 Hypothyroidism due to medicaments and other exogenous substances: Secondary | ICD-10-CM | POA: Diagnosis present

## 2023-05-10 DIAGNOSIS — R310 Gross hematuria: Secondary | ICD-10-CM

## 2023-05-10 DIAGNOSIS — M810 Age-related osteoporosis without current pathological fracture: Secondary | ICD-10-CM | POA: Diagnosis present

## 2023-05-10 DIAGNOSIS — Z79899 Other long term (current) drug therapy: Secondary | ICD-10-CM | POA: Diagnosis not present

## 2023-05-10 DIAGNOSIS — N179 Acute kidney failure, unspecified: Secondary | ICD-10-CM | POA: Diagnosis present

## 2023-05-10 DIAGNOSIS — N2889 Other specified disorders of kidney and ureter: Secondary | ICD-10-CM | POA: Diagnosis present

## 2023-05-10 DIAGNOSIS — K5730 Diverticulosis of large intestine without perforation or abscess without bleeding: Secondary | ICD-10-CM | POA: Diagnosis present

## 2023-05-10 DIAGNOSIS — K5900 Constipation, unspecified: Secondary | ICD-10-CM | POA: Diagnosis present

## 2023-05-10 DIAGNOSIS — Z8249 Family history of ischemic heart disease and other diseases of the circulatory system: Secondary | ICD-10-CM | POA: Diagnosis not present

## 2023-05-10 DIAGNOSIS — R109 Unspecified abdominal pain: Secondary | ICD-10-CM | POA: Diagnosis present

## 2023-05-10 DIAGNOSIS — F32A Depression, unspecified: Secondary | ICD-10-CM | POA: Diagnosis present

## 2023-05-10 LAB — PHOSPHORUS
Phosphorus: 3.5 mg/dL (ref 2.5–4.6)
Phosphorus: 4 mg/dL (ref 2.5–4.6)

## 2023-05-10 LAB — COMPREHENSIVE METABOLIC PANEL
ALT: 17 U/L (ref 0–44)
AST: 18 U/L (ref 15–41)
Albumin: 3.1 g/dL — ABNORMAL LOW (ref 3.5–5.0)
Alkaline Phosphatase: 52 U/L (ref 38–126)
Anion gap: 7 (ref 5–15)
BUN: 21 mg/dL (ref 8–23)
CO2: 23 mmol/L (ref 22–32)
Calcium: 8.4 mg/dL — ABNORMAL LOW (ref 8.9–10.3)
Chloride: 106 mmol/L (ref 98–111)
Creatinine, Ser: 1.19 mg/dL — ABNORMAL HIGH (ref 0.44–1.00)
GFR, Estimated: 49 mL/min — ABNORMAL LOW (ref >60)
Glucose, Bld: 129 mg/dL — ABNORMAL HIGH (ref 70–99)
Potassium: 4.2 mmol/L (ref 3.5–5.1)
Sodium: 136 mmol/L (ref 135–145)
Total Bilirubin: 0.3 mg/dL (ref <1.2)
Total Protein: 6.8 g/dL (ref 6.5–8.1)

## 2023-05-10 LAB — CBC
HCT: 33.7 % — ABNORMAL LOW (ref 36.0–46.0)
Hemoglobin: 10.8 g/dL — ABNORMAL LOW (ref 12.0–15.0)
MCH: 30.3 pg (ref 26.0–34.0)
MCHC: 32 g/dL (ref 30.0–36.0)
MCV: 94.7 fL (ref 80.0–100.0)
Platelets: 284 10*3/uL (ref 150–400)
RBC: 3.56 MIL/uL — ABNORMAL LOW (ref 3.87–5.11)
RDW: 14.5 % (ref 11.5–15.5)
WBC: 9.9 10*3/uL (ref 4.0–10.5)
nRBC: 0 % (ref 0.0–0.2)

## 2023-05-10 LAB — URINE CULTURE: Culture: NO GROWTH

## 2023-05-10 LAB — MAGNESIUM
Magnesium: 2.5 mg/dL — ABNORMAL HIGH (ref 1.7–2.4)
Magnesium: 2.6 mg/dL — ABNORMAL HIGH (ref 1.7–2.4)

## 2023-05-10 LAB — OSMOLALITY, URINE: Osmolality, Ur: 198 mosm/kg — ABNORMAL LOW (ref 300–900)

## 2023-05-10 LAB — OSMOLALITY: Osmolality: 305 mosm/kg — ABNORMAL HIGH (ref 275–295)

## 2023-05-10 LAB — T4, FREE: Free T4: 0.63 ng/dL (ref 0.61–1.12)

## 2023-05-10 LAB — PREALBUMIN: Prealbumin: 20 mg/dL (ref 18–38)

## 2023-05-10 LAB — TSH: TSH: 89.854 u[IU]/mL — ABNORMAL HIGH (ref 0.350–4.500)

## 2023-05-10 LAB — CK: Total CK: 85 U/L (ref 38–234)

## 2023-05-10 LAB — PROCALCITONIN: Procalcitonin: <0.1 ng/mL

## 2023-05-10 MED ORDER — SODIUM CHLORIDE 0.9 % IV SOLN: INTRAVENOUS | Status: DC

## 2023-05-10 MED ORDER — LEVOTHYROXINE SODIUM 75 MCG PO TABS
75.0000 ug | ORAL_TABLET | Freq: Every day | ORAL | Status: DC
  Administered 2023-05-10 – 2023-05-11 (×2): 75 ug via ORAL
  Filled 2023-05-10 (×2): qty 3

## 2023-05-10 MED ORDER — BISACODYL 5 MG PO TBEC
10.0000 mg | DELAYED_RELEASE_TABLET | Freq: Every day | ORAL | Status: DC
  Administered 2023-05-10: 10 mg via ORAL
  Filled 2023-05-10: qty 2

## 2023-05-10 NOTE — Consult Note (Signed)
I have been asked to see the patient by Dr. Therisa Doyne for evaluation and management of gross hematuria and possible renal abscess.  History of present illness: 72 year old woman with a history of lung cancer who completed treatment in May 2024 presented to the ED with hematuria/clots and 6 weeks of right sided abdominal pain.  CT of the abdomen pelvis on 11/22 showed abnormality in the right kidney.  She had a repeat CT in the ED that showed 6 cm area of heterogeneous enhancement with central necrosis concerning for mass vs. Infection also noted to have necrotic lymph node.     Review of systems: A 12 point comprehensive review of systems was obtained and is negative unless otherwise stated in the history of present illness.  Patient Active Problem List   Diagnosis Date Noted   Hematuria 05/09/2023   AKI (acute kidney injury) (HCC) 05/09/2023   Non-small cell cancer of left lung (HCC) 04/30/2023   Pericardial effusion 04/30/2023   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   Tobacco use 01/22/2023   Port-A-Cath in place 09/08/2022   Encounter for antineoplastic chemotherapy 08/25/2022   Malignant neoplasm of unspecified part of unspecified bronchus or lung (HCC) 08/11/2022   Abnormal CT of the chest 07/29/2022   Depression, major, single episode, moderate (HCC) 04/22/2021   Pulmonary nodules 04/14/2019   Thyroid nodule 04/14/2019   Synovial cyst of popliteal space 04/05/2018   Osteoporosis 12/23/2017   Primary insomnia 12/04/2017    No current facility-administered medications on file prior to encounter.   Current Outpatient Medications on File Prior to Encounter  Medication Sig Dispense Refill   albuterol (VENTOLIN HFA) 108 (90 Base) MCG/ACT inhaler Inhale 2 puffs into the lungs every 6 (six) hours as needed for wheezing or shortness of breath. 8 g 6   HYDROcodone bit-homatropine (HYCODAN) 5-1.5 MG/5ML syrup Take 5 mLs by mouth every 6 (six) hours as needed for cough. 120  mL 0   ibuprofen (ADVIL) 800 MG tablet Take 800 mg by mouth every 8 (eight) hours as needed for moderate pain (pain score 4-6), mild pain (pain score 1-3), headache or fever.     levothyroxine (SYNTHROID) 50 MCG tablet Take 1 tablet (50 mcg total) by mouth daily before breakfast. 30 tablet 2    Past Medical History:  Diagnosis Date   Depression    Insomnia    Lung cancer (HCC) 07/25/2022   Osteoporosis    Pulmonary nodule    Thyroid nodule     Past Surgical History:  Procedure Laterality Date   ANKLE SURGERY Right    BREAST BIOPSY Right    benign 2013   BRONCHIAL BIOPSY  08/04/2022   Procedure: BRONCHIAL BIOPSIES;  Surgeon: Leslye Peer, MD;  Location: [facility] ENDOSCOPY;  Service: Cardiopulmonary;;   BRONCHIAL BRUSHINGS  08/04/2022   Procedure: BRONCHIAL BRUSHINGS;  Surgeon: Leslye Peer, MD;  Location: [facility] ENDOSCOPY;  Service: Cardiopulmonary;;   HEMOSTASIS CONTROL  08/04/2022   Procedure: HEMOSTASIS CONTROL;  Surgeon: Leslye Peer, MD;  Location: [facility] ENDOSCOPY;  Service: Cardiopulmonary;;   IR IMAGING GUIDED PORT INSERTION  09/04/2022   repair of a torn meniscus Left 05/2018   VIDEO BRONCHOSCOPY Left 08/04/2022   Procedure: VIDEO BRONCHOSCOPY WITH FLUORO;  Surgeon: Leslye Peer, MD;  Location: [facility] ENDOSCOPY;  Service: Cardiopulmonary;  Laterality: Left;    Social History   Tobacco Use   Smoking status: Former    Current packs/day: 0.00    Average packs/day: 1 pack/day for 41.0 years (  41.0 ttl pk-yrs)    Types: Cigarettes    Start date: 61    Quit date: 2012    Years since quitting: 12.9   Smokeless tobacco: Never  Vaping Use   Vaping status: Never Used  Substance Use Topics   Alcohol use: Yes    Comment: Rarely   Drug use: No    Family History  Problem Relation Age of Onset   Alzheimer's disease Mother    Hypertension Father    Stroke Father    Hyperlipidemia Brother    Breast cancer Neg Hx     PE: Vitals:   05/09/23 2112 05/09/23 2146 05/09/23 2301  05/09/23 2358  BP: (!) 186/94 (!) 124/92 126/73 132/80  Pulse: 78 68 72 71  Resp: 12 14 12 14   Temp:   98.2 F (36.8 C) 97.8 F (36.6 C)  TempSrc:   Oral Oral  SpO2: 97% 99% 99% 97%  Weight:    55.5 kg  Height:       Patient appears to be in no acute distress  patient is alert and oriented x3 Atraumatic normocephalic head No cervical or supraclavicular lymphadenopathy appreciated No increased work of breathing, no audible wheezes/rhonchi Regular sinus rhythm/rate Abdomen is soft, nontender, nondistended, mild RLQ ttp Lower extremities are symmetric without appreciable edema Grossly neurologically intact No identifiable skin lesions  Recent Labs    05/09/23 1900  WBC 8.3  HGB 11.4*  HCT 36.0   Recent Labs    05/09/23 1900  NA 135  K 3.7  CL 102  CO2 23  GLUCOSE 90  BUN 23  CREATININE 1.23*  CALCIUM 8.7*   No results for input(s): "LABPT", "INR" in the last 72 hours. No results for input(s): "LABURIN" in the last 72 hours. Results for orders placed or performed in visit on 07/31/22  Novel Coronavirus, NAA ([facility])     Status: None   Collection Time: 07/31/22 12:00 AM   Specimen: Nasopharyngeal(NP) swabs in vial transport medium   Nasopharynge  Resident  Result Value Ref Range Status   SARS-CoV-2, NAA Not Detected Not Detected Final    Comment: This nucleic acid amplification test was developed and its performance characteristics determined by [employer]. Nucleic acid amplification tests include RT-PCR and TMA. This test has not been FDA cleared or approved. This test has been authorized by FDA under an Emergency Use Authorization (EUA). This test is only authorized for the duration of time the declaration that circumstances exist justifying the authorization of the emergency use of in vitro diagnostic tests for detection of SARS-CoV-2 virus and/or diagnosis of COVID-19 infection under section 564(b)(1) of the Act, 21 U.S.C. 191YNW-2(N) (1), unless  the authorization is terminated or revoked sooner. When diagnostic testing is negative, the possibility of a false negative result should be considered in the context of a patient's recent exposures and the presence of clinical signs and symptoms consistent with COVID-19. An individual without symptoms of COVID-19 and who is not shedding SARS-CoV-2 virus wo uld expect to have a negative (not detected) result in this assay.     Imaging: CT Abd/Pelvis 05/09/23 IMPRESSION: 1. 6.1 x 5.4 cm area of heterogeneous hypoenhancement in the posterior mid to lower right kidney with increased appearance of central liquefaction necrosis from 8 days ago. There is again lack of excretion from the involved parenchyma into the calices, and mild urothelial thickening in the adjacent right renal pelvis. Infection with potentially a developing renal abscess and tumor could both have this appearance,  with infection favored given that this was not present on 03/05/2023. 2. Further supporting an infectious etiology, there is again noted faint hypoenhancement over portions of the posterior mid left kidney best seen on the delayed images and likely indicating pyelonephritis. 3. Stable 2.8 x 2.3 cm centrally necrotic retrocaval lymph node at the level of the right kidney, again partially effacing the IVC and displacing the right renal artery slightly anteriorly without effacement. 4. 3 lung nodules in the lower lobes, largest 6 mm in the right lower lobe, concerning for metastases. 5. Constipation and diverticulosis. 6. Aortic atherosclerosis.   Aortic Atherosclerosis (ICD10-I70.0).     Electronically Signed   By: Almira Bar M.D.   On: 05/09/2023 20:41  Assessment/Plan: Right renal mass vs. Infection with necrosis -Reviewed imaging with interventional radiology who favors mass within necrotic lymph node.  There is no obvious fluid collection or abscess to drain. -Patient has an outpatient  appointment with Dr. Liliane Shi on 12/3 -Will treat empirically with antibiotics for the next 24 hours and see if there is any improvement however she likely needs cystoscopy/diagnostic ureteroscopy in anticipation of possible nephrectomy  Please page with any further questions or concerns. Asencion Loveday D Gianny Sabino

## 2023-05-10 NOTE — Progress Notes (Signed)
PROGRESS NOTE    Evelyn Jordan  ZOX:[id number] DOB: [birth year] DOA: 05/09/2023 PCP: [name], MD    Brief Narrative:  72 y.o. female with medical history significant of LUng Cancer, pericardial effusion, hypothyroidism, renal mass admitted with right-sided abdominal pain gross hematuria and clots.  CT scan done on 22 November showed abnormal lymph nodes and abnormality of right kidney she was supposed to follow-up with urology on the day of admission but since her pain was worse she decided to come to the ER.  Have chronic wheezing and chronic cough due to history of lung cancer.  CT abdomen and pelvis with contrast on 05/09/2023  6.1 x 5.4 cm area of heterogeneous hypoenhancement in the posterior mid to lower right kidney with increased appearance of central liquefaction necrosis from 8 days ago. There is again lack of excretion from the involved parenchyma into the calices, and mild urothelial thickening in the adjacent right renal pelvis. Infection with potentially a developing renal abscess and tumor could both have this appearance, with infection favored given that this was not present on 03/05/2023. Further supporting an infectious etiology, there is again noted faint hypoenhancement over portions of the posterior mid left kidney best seen on the delayed images and likely indicating pyelonephritis.Stable 2.8 x 2.3 cm centrally necrotic retrocaval lymph node at the level of the right kidney, again partially effacing the IVC and displacing the right renal artery slightly anteriorly without effacement.3 lung nodules in the lower lobes, largest 6 mm in the right lower lobe, concerning for metastases. Constipation and diverticulosis. Aortic atherosclerosis.  Assessment & Plan:   Principal Problem:   Hematuria Active Problems:   Pulmonary nodules   Port-A-Cath in place   Non-small cell cancer of left lung (HCC)   Pericardial effusion   Hypothyroidism due to medication    AKI (acute kidney injury) (HCC)  #1 gross hematuria with right renal mass infection versus mass-patient has been started on Rocephin 2 g daily.  Will continue the same.  Urine culture pending.  She has follow-up appointment with Dr. Liliane Shi on December 3.  Appreciate Dr. Thomos Lemons input. Patient was started on a diet today since there was no surgery or IR appointments planned. Per urology she will need cystoscopy ureteroscopy in anticipation of possible nephrectomy.  #2 hypothyroidism secondary to chemo her TSH is elevated at 89!  T3 and T4 are pending.  Will increase Synthroid to 75 mcg daily.  #3 non-small cell lung cancer followed by Dr. Arbutus Ped  #4 small pericardial effusion patient is asymptomatic from this  #5 AKI slow IV fluids follow-up labs in a.m.   Estimated body mass index is 21.67 kg/m as calculated from the following:   Height as of this encounter: 5\' 3"  (1.6 m).   Weight as of this encounter: 55.5 kg.  DVT prophylaxis: Lovenox Code Status: Full code  family Communication: Husband at bedside    Consultants:  Nephrology  Procedures: None Antimicrobials: Rocephin 2 g  Subjective: Complains of 5 out of 10 pain on the right side of her abdomen she had gross hematuria this morning  Objective: Vitals:   05/09/23 2358 05/10/23 0436 05/10/23 0826 05/10/23 1242  BP: 132/80 130/71 125/85 (!) 166/79  Pulse: 71 (!) 59 64 66  Resp: 14 14 18 18   Temp: 97.8 F (36.6 C) 97.7 F (36.5 C) 98.2 F (36.8 C) 98.3 F (36.8 C)  TempSrc: Oral Oral Oral Oral  SpO2: 97% 96% 97% 99%  Weight: 55.5 kg  Height:        Intake/Output Summary (Last 24 hours) at 05/10/2023 1259 Last data filed at 05/10/2023 0440 Gross per 24 hour  Intake 500 ml  Output 200 ml  Net 300 ml   Filed Weights   05/09/23 1828 05/09/23 2358  Weight: 55.2 kg 55.5 kg    Examination:  General exam: Appears in no acute distress Respiratory system: Clear to auscultation. Respiratory effort  normal. Cardiovascular system: S1 & S2 heard, RRR. No JVD, murmurs, rubs, gallops or clicks. No pedal edema. Gastrointestinal system: Abdomen is nondistended, soft and right abdominal tender. No organomegaly or masses felt. Normal bowel sounds heard. Central nervous system: Alert and oriented. No focal neurological deficits. Extremities: No edema Data Reviewed: I have personally reviewed following labs and imaging studies  CBC: Recent Labs  Lab 05/09/23 1900 05/10/23 0600  WBC 8.3 9.9  NEUTROABS 6.8  --   HGB 11.4* 10.8*  HCT 36.0 33.7*  MCV 95.5 94.7  PLT 326 284   Basic Metabolic Panel: Recent Labs  Lab 05/09/23 1900 05/10/23 0026 05/10/23 0600  NA 135  --  136  K 3.7  --  4.2  CL 102  --  106  CO2 23  --  23  GLUCOSE 90  --  129*  BUN 23  --  21  CREATININE 1.23*  --  1.19*  CALCIUM 8.7*  --  8.4*  MG  --  2.5* 2.6*  PHOS  --  3.5 4.0   GFR: Estimated Creatinine Clearance: 35.9 mL/min (A) (by C-G formula based on SCr of 1.19 mg/dL (H)). Liver Function Tests: Recent Labs  Lab 05/09/23 1900 05/10/23 0600  AST 21 18  ALT 19 17  ALKPHOS 56 52  BILITOT 0.3 0.3  PROT 7.4 6.8  ALBUMIN 3.6 3.1*   No results for input(s): "LIPASE", "AMYLASE" in the last 168 hours. No results for input(s): "AMMONIA" in the last 168 hours. Coagulation Profile: No results for input(s): "INR", "PROTIME" in the last 168 hours. Cardiac Enzymes: Recent Labs  Lab 05/10/23 0026  CKTOTAL 85   BNP (last 3 results) No results for input(s): "PROBNP" in the last 8760 hours. HbA1C: No results for input(s): "HGBA1C" in the last 72 hours. CBG: No results for input(s): "GLUCAP" in the last 168 hours. Lipid Profile: No results for input(s): "CHOL", "HDL", "LDLCALC", "TRIG", "CHOLHDL", "LDLDIRECT" in the last 72 hours. Thyroid Function Tests: Recent Labs    05/10/23 0026  TSH 89.854*   Anemia Panel: No results for input(s): "VITAMINB12", "FOLATE", "FERRITIN", "TIBC", "IRON",  "RETICCTPCT" in the last 72 hours. Sepsis Labs: Recent Labs  Lab 05/09/23 1943 05/10/23 0026  PROCALCITON  --  <0.10  LATICACIDVEN 0.5  --     No results found for this or any previous visit (from the past 240 hour(s)).       Radiology Studies: CT ABDOMEN PELVIS W CONTRAST  Result Date: 05/09/2023 CLINICAL DATA:  Right-sided abdominal pain for 6 weeks with hematuria. Known left hilar/perihilar bronchogenic carcinoma diagnosed with bronchoscopy 08/04/2022. EXAM: CT ABDOMEN AND PELVIS WITH CONTRAST TECHNIQUE: Multidetector CT imaging of the abdomen and pelvis was performed using the standard protocol following bolus administration of intravenous contrast. RADIATION DOSE REDUCTION: This exam was performed according to the departmental dose-optimization program which includes automated exposure control, adjustment of the mA and/or kV according to patient size and/or use of iterative reconstruction technique. CONTRAST:  OMNIPAQUE IOHEXOL 300 MG/ML  SOLN COMPARISON:  PET-CT 08/13/2022, CT abdomen pelvis with  IV contrast last week 05/01/2023. Also chest CT with contrast 03/05/2023. FINDINGS: Lower chest: There is scarring and tenting along the left hemidiaphragm. There is trace left subpulmonic loculated pleural fluid. There is a new 3 mm left lower lobe nodule posterior to the left hemidiaphragm on 6:25 and a new 3 mm nodule in the anterolateral left base on the first image, concerning for metastases. There is a 6 mm right lower solid nodule posteriorly on 6:21 also new from the PET-CT and compared with the chest CT from 03/05/2023 and also most likely a metastasis. A densely calcified right lower lobe granuloma is again seen, scattered linear scar-like opacities in the right base. The heart is slightly enlarged. Small pericardial effusion collection inferior to the heart. Small hiatal hernia. Hepatobiliary: The liver is mildly steatotic including with focal periligamentous fat in segment 4 B. No  metastasis is seen. Unremarkable gallbladder, bile ducts. Pancreas: No mass enhancement, ductal dilatation or inflammatory changes. Spleen: No mass enhancement.  No splenomegaly. Adrenals/Urinary Tract: There is no adrenal mass. No focal left renal mass enhancement. Both renal arteries and veins opacify well. In the posterior mid to lower right kidney, again noted is a 6.1 x 5.4 cm area of heterogeneous hypoenhancement with adjacent inflammatory reaction, little changed in overall size but with increased appearance of central liquefaction necrosis. Mild urothelial thickening in the adjacent right renal pelvis appears similar to 8 days ago and there is again lack of excretion from the involved parenchyma into the calices, which are effaced within the abnormality. Infection with potentially a developing renal abscess and tumor could both have this appearance, although infection is favored given that this was not present on 03/05/2023. Further supporting an infectious etiology, there is again noted faint hypoenhancement over portions of the posterior mid left kidney best seen on the delayed images and likely indicating pyelonephritis. There is no evidence of urinary stones or obstruction. The bladder is unremarkable for the degree of distention. Stomach/Bowel: No dilatation or overt wall thickening. The gastric wall is contracted. An appendix is not seen in this patient. There is moderate to severe fecal stasis in the ascending colon, moderate fecal stasis in the transverse segment and uncomplicated scattered left-sided diverticula. Vascular/Lymphatic: Again noted is a 2.8 x 2.3 cm centrally necrotic retrocaval lymph node at the level of the right kidney, again partially effacing the IVC and displacing the right renal artery slightly anteriorly without effacement. No further adenopathy is evident. There is aortic atherosclerosis. Accessory circumaortic left renal vein. Reproductive: Uterus and bilateral adnexa are  unremarkable. Other: No free fluid, free air or incarcerated hernias. Musculoskeletal: Levoscoliosis and degenerative change lumbar spine. No bone metastasis is seen. Mild hip DJD. 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 IMPRESSION: 1. 6.1 x 5.4 cm area of heterogeneous hypoenhancement in the posterior mid to lower right kidney with increased appearance of central liquefaction necrosis from 8 days ago. There is again lack of excretion from the involved parenchyma into the calices, and mild urothelial thickening in the adjacent right renal pelvis. Infection with potentially a developing renal abscess and tumor could both have this appearance, with infection favored given that this was not present on 03/05/2023. 2. Further supporting an infectious etiology, there is again noted faint hypoenhancement over portions of the posterior mid left kidney best seen on the delayed images and likely indicating pyelonephritis. 3. Stable 2.8 x 2.3 cm centrally necrotic retrocaval lymph node at the level of the right kidney, again  partially effacing the IVC and displacing the right renal artery slightly anteriorly without effacement. 4. 3 lung nodules in the lower lobes, largest 6 mm in the right lower lobe, concerning for metastases. 5. Constipation and diverticulosis. 6. Aortic atherosclerosis. Aortic Atherosclerosis (ICD10-I70.0). Electronically Signed   By: Almira Bar M.D.   On: 05/09/2023 20:41     Scheduled Meds:  bisacodyl  10 mg Oral Daily   docusate sodium  100 mg Oral BID   levothyroxine  75 mcg Oral Q0600    morphine injection  4 mg Intravenous Once   ondansetron (ZOFRAN) IV  4 mg Intravenous Once   senna  1 tablet Oral BID   Continuous Infusions:  sodium chloride 100 mL/hr at 05/10/23 1018   cefTRIAXone (ROCEPHIN)  IV       LOS: 0 days    Time spent: 38 min  Alwyn Ren, MD [facility]  05/10/2023, 12:59 PM

## 2023-05-10 NOTE — Plan of Care (Signed)

## 2023-05-11 ENCOUNTER — Encounter: Payer: Self-pay | Admitting: Internal Medicine

## 2023-05-11 ENCOUNTER — Other Ambulatory Visit (HOSPITAL_COMMUNITY): Payer: Self-pay

## 2023-05-11 DIAGNOSIS — R310 Gross hematuria: Secondary | ICD-10-CM | POA: Diagnosis not present

## 2023-05-11 LAB — T3, FREE: T3, Free: 1.3 pg/mL — ABNORMAL LOW (ref 2.0–4.4)

## 2023-05-11 MED ORDER — HYDROCODONE-ACETAMINOPHEN 5-325 MG PO TABS: 1.0000 | ORAL_TABLET | ORAL | 0 refills | Status: DC | PRN

## 2023-05-11 MED ORDER — CEFADROXIL 500 MG PO CAPS: 500.0000 mg | ORAL_CAPSULE | Freq: Two times a day (BID) | ORAL | 0 refills | Status: DC

## 2023-05-11 MED ORDER — LEVOTHYROXINE SODIUM 75 MCG PO TABS: 75.0000 ug | ORAL_TABLET | Freq: Every day | ORAL | 2 refills | Status: DC

## 2023-05-11 MED ORDER — ACETAMINOPHEN 325 MG PO TABS: 650.0000 mg | ORAL_TABLET | Freq: Four times a day (QID) | ORAL | Status: DC | PRN

## 2023-05-11 MED ORDER — SENNA 8.6 MG PO TABS: 1.0000 | ORAL_TABLET | Freq: Two times a day (BID) | ORAL | 0 refills | Status: DC

## 2023-05-11 MED ORDER — POLYETHYLENE GLYCOL 3350 17 G PO PACK: 17.0000 g | PACK | Freq: Every day | ORAL | 0 refills | Status: DC | PRN

## 2023-05-11 NOTE — Telephone Encounter (Signed)
Pt has an upcoming appt on 05/12/2023

## 2023-05-11 NOTE — Plan of Care (Signed)
  Problem: Clinical Measurements: Goal: Diagnostic test results will improve Outcome: Progressing Goal: Cardiovascular complication will be avoided Outcome: Progressing   Problem: Activity: Goal: Risk for activity intolerance will decrease Outcome: Progressing   Problem: Pain Management: Goal: General experience of comfort will improve Outcome: Progressing

## 2023-05-11 NOTE — Progress Notes (Signed)
Subjective: First time meeting Evelyn Jordan.  She was accompanied by her husband.  Reviewed case and plan and all questions were answered to their satisfaction.  Planning for follow-up in clinic with Dr. Liliane Shi on 05/12/2023.  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 Arita Miss who had discussed with interventional radiology.  Favoring mass with necrotic lymph node over infectious process.  It is reasonable to continue antibiotics until she is seen in clinic.  No signs of systemic infectious process.  Patient remains normothermic with no leukocytosis. Patient reports pain level oscillates between 2-3 and 5-6.  Will manage with as needed Norco. Okay to discharge from urologic perspective.  She would be best served by attending her outpatient appointment with Dr. Liliane Shi tomorrow and is comfortable discharging home today.  Intake/Output from previous day: 12/01 0701 - 12/02 0700 In: 2455.2 [P.O.:480; I.V.:1875.2; IV Piggyback:100] Out: 1700 [Urine:1700]  Intake/Output this shift: No intake/output data recorded.  Physical Exam:  General: Alert and oriented CV: No cyanosis Lungs: equal chest rise   Lab Results: Recent Labs    05/09/23 1900 05/10/23 0600  HGB 11.4* 10.8*  HCT 36.0 33.7*   BMET Recent Labs    05/09/23 1900 05/10/23 0600  NA 135 136  K 3.7 4.2  CL 102 106  CO2 23 23  GLUCOSE 90 129*  BUN 23 21  CREATININE 1.23* 1.19*  CALCIUM 8.7* 8.4*     Studies/Results: CT ABDOMEN PELVIS W CONTRAST  Result Date: 05/09/2023 CLINICAL DATA:  Right-sided abdominal pain for 6 weeks with hematuria. 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 EXAM: CT ABDOMEN AND PELVIS WITH CONTRAST TECHNIQUE: Multidetector CT imaging of the abdomen and pelvis was performed using the standard protocol following bolus administration of intravenous contrast. RADIATION DOSE REDUCTION: This exam was performed according to the departmental dose-optimization program which includes automated exposure control, adjustment of the mA and/or kV according to patient size and/or use of iterative reconstruction technique. CONTRAST:  OMNIPAQUE IOHEXOL 300 MG/ML  SOLN COMPARISON:  PET-CT 08/13/2022, CT abdomen pelvis with IV contrast last week 05/01/2023. Also chest CT with contrast 03/05/2023. FINDINGS: Lower chest: There is scarring and tenting along the left hemidiaphragm. There is trace left subpulmonic loculated pleural fluid. There is a new 3 mm left lower lobe nodule posterior to the left hemidiaphragm on 6:25 and a new 3 mm nodule in the anterolateral left base on the first image, concerning for metastases. There is a 6 mm right lower solid nodule posteriorly on 6:21 also new from the PET-CT and compared with the chest CT from 03/05/2023 and also most likely a metastasis. A densely calcified right lower lobe granuloma is again seen, scattered linear scar-like opacities in the right base. The heart is slightly enlarged. Small pericardial effusion collection inferior to the heart. Small hiatal hernia. Hepatobiliary: The liver is mildly steatotic including with focal periligamentous fat in segment 4 B. No metastasis is seen. Unremarkable gallbladder, bile ducts. Pancreas: No mass enhancement, ductal dilatation or inflammatory changes. Spleen: No mass enhancement.  No splenomegaly. Adrenals/Urinary Tract: There is no adrenal mass. No focal left renal mass enhancement. Both renal arteries  and veins opacify well. In the posterior mid to lower right kidney, again noted is a 6.1 x 5.4 cm area of heterogeneous hypoenhancement with adjacent inflammatory  reaction, little changed in overall size but with increased appearance of central liquefaction necrosis. Mild urothelial thickening in the adjacent right renal pelvis appears similar to 8 days ago and there is again lack of excretion from the involved parenchyma into the calices, which are effaced within the abnormality. Infection with potentially a developing renal abscess and tumor could both have this appearance, although infection is favored given that this was not present on 03/05/2023. Further supporting an infectious etiology, there is again noted faint hypoenhancement over portions of the posterior mid left kidney best seen on the delayed images and likely indicating pyelonephritis. There is no evidence of urinary stones or obstruction. The bladder is unremarkable for the degree of distention. Stomach/Bowel: No dilatation or overt wall thickening. The gastric wall is contracted. An appendix is not seen in this patient. There is moderate to severe fecal stasis in the ascending colon, moderate fecal stasis in the transverse segment and uncomplicated scattered left-sided diverticula. Vascular/Lymphatic: Again noted is a 2.8 x 2.3 cm centrally necrotic retrocaval lymph node at the level of the right kidney, again partially effacing the IVC and displacing the right renal artery slightly anteriorly without effacement. No further adenopathy is evident. There is aortic atherosclerosis. Accessory circumaortic left renal vein. Reproductive: Uterus and bilateral adnexa are unremarkable. Other: No free fluid, free air or incarcerated hernias. Musculoskeletal: Levoscoliosis and degenerative change lumbar spine. No bone metastasis is seen. Mild hip DJD. 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 IMPRESSION: 1. 6.1 x 5.4 cm area of heterogeneous hypoenhancement in the posterior mid to lower right kidney with increased appearance of central liquefaction necrosis from 8 days ago.  There is again lack of excretion from the involved parenchyma into the calices, and mild urothelial thickening in the adjacent right renal pelvis. Infection with potentially a developing renal abscess and tumor could both have this appearance, with infection favored given that this was not present on 03/05/2023. 2. Further supporting an infectious etiology, there is again noted faint hypoenhancement over portions of the posterior mid left kidney best seen on the delayed images and likely indicating pyelonephritis. 3. Stable 2.8 x 2.3 cm centrally necrotic retrocaval lymph node at the level of the right kidney, again partially effacing the IVC and displacing the right renal artery slightly anteriorly without effacement. 4. 3 lung nodules in the lower lobes, largest 6 mm in the right lower lobe, concerning for metastases. 5. Constipation and diverticulosis. 6. Aortic atherosclerosis. Aortic Atherosclerosis (ICD10-I70.0). Electronically Signed   By: Almira Bar M.D.   On: 05/09/2023 20:41      LOS: 1 day   Elmon Kirschner, NP [facility] Pager: [phone]  05/11/2023, 1:31 PM

## 2023-05-11 NOTE — Discharge Summary (Signed)
Physician Discharge Summary  Evelyn Jordan NFA:[id number] DOB: [birth year] DOA: 05/09/2023  PCP: [name], MD  Admit date: 05/09/2023 Discharge date: 05/11/2023  Admitted From: Home Disposition: Home  recommendations for Outpatient Follow-up:  Follow up with PCP in 1-2 weeks Please obtain BMP/CBC in one week Please follow up with Dr. Liliane Shi on 05/12/2023 urology  Home Health: None Equipment/Devices: None  Discharge Condition: Stable CODE STATUS: Full Diet recommendation: Cardiac  Brief/Interim Summary:72 y.o. female with medical history significant of LUng Cancer, pericardial effusion, hypothyroidism, renal mass admitted with right-sided abdominal pain gross hematuria and clots.  CT scan done on 22 November showed abnormal lymph nodes and abnormality of right kidney she was supposed to follow-up with urology on the day of admission but since her pain was worse she decided to come to the ER.  Have chronic wheezing and chronic cough due to history of lung cancer.  CT abdomen and pelvis with contrast on 05/09/2023  6.1 x 5.4 cm area of heterogeneous hypoenhancement in the posterior mid to lower right kidney with increased appearance of central liquefaction necrosis from 8 days ago. There is again lack of excretion from the involved parenchyma into the calices, and mild urothelial thickening in the adjacent right renal pelvis. Infection with potentially a developing renal abscess and tumor could both have this appearance, with infection favored given that this was not present on 03/05/2023. Further supporting an infectious etiology, there is again noted faint hypoenhancement over portions of the posterior mid left kidney best seen on the delayed images and likely indicating pyelonephritis.Stable 2.8 x 2.3 cm centrally necrotic retrocaval lymph node at the level of the right kidney, again partially effacing the IVC and displacing the right renal artery slightly anteriorly  without effacement.3 lung nodules in the lower lobes, largest 6 mm in the right lower lobe, concerning for metastases. Constipation and diverticulosis. Aortic atherosclerosis.     Discharge Diagnoses:  Principal Problem:   Hematuria Active Problems:   Pulmonary nodules   Port-A-Cath in place   Non-small cell cancer of left lung (HCC)   Pericardial effusion   Hypothyroidism due to medication   AKI (acute kidney injury) (HCC)    #1 gross hematuria with right renal mass infection versus mass-patient was started on Rocephin 2 g daily.  Urine culture showed no growth.  She was seen by urology.  This was felt to be more necrosis than a mass or infection.  However she was discharged home on cefadroxil 500 twice daily for 4 more days.She has follow-up appointment with Dr. Liliane Shi on December 3.Per urology she will need cystoscopy ureteroscopy in anticipation of possible nephrectomy.   #2 hypothyroidism secondary to chemo her TSH is elevated at 89! increase Synthroid to 75 mcg daily.   #3 non-small cell lung cancer followed by Dr. Arbutus Ped   #4 small pericardial effusion patient is asymptomatic from this   #5 AKI resolved with IV fluids Estimated body mass index is 21.67 kg/m as calculated from the following:   Height as of this encounter: 5\' 3"  (1.6 m).   Weight as of this encounter: 55.5 kg.  Discharge Instructions  Discharge Instructions     Diet - low sodium heart healthy   Complete by: As directed    Increase activity slowly   Complete by: As directed       Allergies as of 05/11/2023       Reactions   Compazine [prochlorperazine Edisylate]    Neck hyperextends  Medication List     STOP taking these medications    HYDROcodone bit-homatropine 5-1.5 MG/5ML syrup Commonly known as: Hycodan   ibuprofen 800 MG tablet Commonly known as: ADVIL       TAKE these medications    acetaminophen 325 MG tablet Commonly known as: TYLENOL Take 2 tablets (650 mg  total) by mouth every 6 (six) hours as needed for mild pain (pain score 1-3) (or Fever >/= 101).   albuterol 108 (90 Base) MCG/ACT inhaler Commonly known as: VENTOLIN HFA Inhale 2 puffs into the lungs every 6 (six) hours as needed for wheezing or shortness of breath.   cefadroxil 500 MG capsule Commonly known as: DURICEF Take 1 capsule (500 mg total) by mouth 2 (two) times daily.   HYDROcodone-acetaminophen 5-325 MG tablet Commonly known as: NORCO/VICODIN Take 1-2 tablets by mouth every 4 (four) hours as needed for moderate pain (pain score 4-6).   levothyroxine 75 MCG tablet Commonly known as: SYNTHROID Take 1 tablet (75 mcg total) by mouth daily at 6 (six) AM. Start taking on: May 12, 2023 What changed:  medication strength how much to take when to take this   polyethylene glycol 17 g packet Commonly known as: MIRALAX / GLYCOLAX Take 17 g by mouth daily as needed for mild constipation.   senna 8.6 MG Tabs tablet Commonly known as: SENOKOT Take 1 tablet (8.6 mg total) by mouth 2 (two) times daily.        Follow-up Information     [name], MD Follow up.   Specialty: Family Medicine Contact information: [address] [place] [address] [phone]         Rene Paci, MD Follow up.   Specialty: Urology Contact information: [address] [place] [place] [address] [phone]                Allergies  Allergen Reactions   Compazine [Prochlorperazine Edisylate]     Neck hyperextends    Consultations: urology   Procedures/Studies: CT ABDOMEN PELVIS W CONTRAST  Result Date: 05/09/2023 CLINICAL DATA:  Right-sided abdominal pain for 6 weeks with hematuria. Known left hilar/perihilar bronchogenic carcinoma diagnosed with bronchoscopy 08/04/2022. EXAM: CT ABDOMEN AND PELVIS WITH CONTRAST TECHNIQUE: Multidetector CT imaging of the abdomen and pelvis was performed using the standard protocol following bolus  administration of intravenous contrast. RADIATION DOSE REDUCTION: This exam was performed according to the departmental dose-optimization program which includes automated exposure control, adjustment of the mA and/or kV according to patient size and/or use of iterative reconstruction technique. CONTRAST:  OMNIPAQUE IOHEXOL 300 MG/ML  SOLN COMPARISON:  PET-CT 08/13/2022, CT abdomen pelvis with IV contrast last week 05/01/2023. Also chest CT with contrast 03/05/2023. FINDINGS: Lower chest: There is scarring and tenting along the left hemidiaphragm. There is trace left subpulmonic loculated pleural fluid. There is a new 3 mm left lower lobe nodule posterior to the left hemidiaphragm on 6:25 and a new 3 mm nodule in the anterolateral left base on the first image, concerning for metastases. There is a 6 mm right lower solid nodule posteriorly on 6:21 also new from the PET-CT and compared with the chest CT from 03/05/2023 and also most likely a metastasis. A densely calcified right lower lobe granuloma is again seen, scattered linear scar-like opacities in the right base. The heart is slightly enlarged. Small pericardial effusion collection inferior to the heart. Small hiatal hernia. Hepatobiliary: The liver is mildly steatotic including with focal periligamentous fat in  segment 4 B. No metastasis is seen. Unremarkable gallbladder, bile ducts. Pancreas: No mass enhancement, ductal dilatation or inflammatory changes. Spleen: No mass enhancement.  No splenomegaly. Adrenals/Urinary Tract: There is no adrenal mass. No focal left renal mass enhancement. Both renal arteries and veins opacify well. In the posterior mid to lower right kidney, again noted is a 6.1 x 5.4 cm area of heterogeneous hypoenhancement with adjacent inflammatory reaction, little changed in overall size but with increased appearance of central liquefaction necrosis. Mild urothelial thickening in the adjacent right renal pelvis appears similar to 8  days ago and there is again lack of excretion from the involved parenchyma into the calices, which are effaced within the abnormality. Infection with potentially a developing renal abscess and tumor could both have this appearance, although infection is favored given that this was not present on 03/05/2023. Further supporting an infectious etiology, there is again noted faint hypoenhancement over portions of the posterior mid left kidney best seen on the delayed images and likely indicating pyelonephritis. There is no evidence of urinary stones or obstruction. The bladder is unremarkable for the degree of distention. Stomach/Bowel: No dilatation or overt wall thickening. The gastric wall is contracted. An appendix is not seen in this patient. There is moderate to severe fecal stasis in the ascending colon, moderate fecal stasis in the transverse segment and uncomplicated scattered left-sided diverticula. Vascular/Lymphatic: Again noted is a 2.8 x 2.3 cm centrally necrotic retrocaval lymph node at the level of the right kidney, again partially effacing the IVC and displacing the right renal artery slightly anteriorly without effacement. No further adenopathy is evident. There is aortic atherosclerosis. Accessory circumaortic left renal vein. Reproductive: Uterus and bilateral adnexa are unremarkable. Other: No free fluid, free air or incarcerated hernias. Musculoskeletal: Levoscoliosis and degenerative change lumbar spine. No bone metastasis is seen. Mild hip DJD. 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 IMPRESSION: 1. 6.1 x 5.4 cm area of heterogeneous hypoenhancement in the posterior mid to lower right kidney with increased appearance of central liquefaction necrosis from 8 days ago. There is again lack of excretion from the involved parenchyma into the calices, and mild urothelial thickening in the adjacent right renal pelvis. Infection with potentially a developing  renal abscess and tumor could both have this appearance, with infection favored given that this was not present on 03/05/2023. 2. Further supporting an infectious etiology, there is again noted faint hypoenhancement over portions of the posterior mid left kidney best seen on the delayed images and likely indicating pyelonephritis. 3. Stable 2.8 x 2.3 cm centrally necrotic retrocaval lymph node at the level of the right kidney, again partially effacing the IVC and displacing the right renal artery slightly anteriorly without effacement. 4. 3 lung nodules in the lower lobes, largest 6 mm in the right lower lobe, concerning for metastases. 5. Constipation and diverticulosis. 6. Aortic atherosclerosis. Aortic Atherosclerosis (ICD10-I70.0). Electronically Signed   By: Almira Bar M.D.   On: 05/09/2023 20:41   CT ABDOMEN PELVIS W CONTRAST  Result Date: 05/01/2023 CLINICAL DATA:  Right lower quadrant abdominal pain. EXAM: CT ABDOMEN AND PELVIS WITH CONTRAST TECHNIQUE: Multidetector CT imaging of the abdomen and pelvis was performed using the standard protocol following bolus administration of intravenous contrast. RADIATION DOSE REDUCTION: This exam was performed according to the departmental dose-optimization program which includes automated exposure control, adjustment of the mA and/or kV according to patient size and/or use of iterative reconstruction technique. CONTRAST:  ISOVUE-300 IOPAMIDOL (  ISOVUE-300) INJECTION 61% COMPARISON:  None Available. FINDINGS: Lower chest: Trace pericardial effusion evident. Calcified granulomata are noted in the right lower lobe. 6 mm noncalcified right lower lobe pulmonary nodule identified on image 5/series 4, new since chest CT 03/05/2023. Hepatobiliary: No suspicious focal abnormality within the liver parenchyma. There is no evidence for gallstones, gallbladder wall thickening, or pericholecystic fluid. No intrahepatic or extrahepatic biliary dilation. Pancreas: No  focal mass lesion. No dilatation of the main duct. No intraparenchymal cyst. No peripancreatic edema. Spleen: No splenomegaly. No suspicious focal mass lesion. Adrenals/Urinary Tract: No adrenal nodule or mass. Abnormal perfusion noted in the upper and mid right kidney with notable distorted parenchymal architecture in the posterior interpolar region and loss of fat plane between the kidney in the right psoas muscle. There is perinephric edema/inflammation in this region. Left renal vein appears patent. Delayed imaging shows wall thickening in the renal pelvis with obstruction of upper pole calices and lack of excretion from posterior calices in the interpolar region. Left kidney unremarkable. No evidence for hydroureter. The urinary bladder appears normal for the degree of distention. Stomach/Bowel: Stomach is unremarkable. No gastric wall thickening. No evidence of outlet obstruction. Duodenum is normally positioned as is the ligament of Treitz. No small bowel wall thickening. No small bowel dilatation. The terminal ileum is normal. The appendix is not well visualized, but there is no edema or inflammation in the region of the cecal tip to suggest appendicitis. No gross colonic mass. No colonic wall thickening. Large stool volume evident. Vascular/Lymphatic: There is mild atherosclerotic calcification of the abdominal aorta without aneurysm. 2.3 cm necrotic lymph node identified in the retrocaval space (26/2) displacing the right renal artery in the IVC anteriorly. Portal vein and superior mesenteric vein are patent. No pelvic sidewall lymphadenopathy. Reproductive: The uterus is unremarkable.  There is no adnexal mass. Other: No intraperitoneal free fluid. Musculoskeletal: No worrisome lytic or sclerotic osseous abnormality. IMPRESSION: 1. Abnormal perfusion in the upper and mid right kidney with notable distorted parenchymal architecture in the posterior interpolar region and loss of fat plane between the kidney  in the right psoas muscle. There is perinephric edema/inflammation in this region. Delayed imaging shows wall thickening in the renal pelvis with obstruction of upper pole calices and lack of excretion from posterior calices in the interpolar region. These changes are associated with a necrotic retrocaval lymph node. Given the presence of this lymph node, urothelial neoplasm is a concern although pyelonephritis is also a consideration with necrotic retroperitoneal lymphadenopathy. 2. As above, 2.3 cm necrotic lymph node in the retrocaval space displacing the right renal artery and the IVC anteriorly. This may be reactive or metastatic. 3. 6 mm noncalcified right lower lobe pulmonary nodule, new since chest CT 03/05/2023. This is indeterminate but given findings in the right kidney, close follow-up warranted. 4. Large stool volume. Imaging features compatible with constipation. 5.  Aortic Atherosclerosis (ICD10-I70.0). These results will be called to the ordering clinician or representative by the Radiologist Assistant, and communication documented in the PACS or [employer]. Electronically Signed   By: Kennith Center M.D.   On: 05/01/2023 13:02   (Echo, Carotid, EGD, Colonoscopy, ERCP)    Subjective: Patient is resting in bed husband at bedside anxious to go home no new complaints  Discharge Exam: Vitals:   05/10/23 2111 05/11/23 0440  BP: 129/67 135/77  Pulse: 64 74  Resp: 14 14  Temp: 99 F (37.2 C) 98.4 F (36.9 C)  SpO2: 99% 98%   Vitals:  05/10/23 0826 05/10/23 1242 05/10/23 2111 05/11/23 0440  BP: 125/85 (!) 166/79 129/67 135/77  Pulse: 64 66 64 74  Resp: 18 18 14 14   Temp: 98.2 F (36.8 C) 98.3 F (36.8 C) 99 F (37.2 C) 98.4 F (36.9 C)  TempSrc: Oral Oral Oral Oral  SpO2: 97% 99% 99% 98%  Weight:      Height:        General: Pt is alert, awake, not in acute distress Cardiovascular: RRR, S1/S2 +, no rubs, no gallops Respiratory: CTA bilaterally, no wheezing, no  rhonchi Abdominal: Soft, NT, ND, bowel sounds + Extremities: no edema, no cyanosis    The results of significant diagnostics from this hospitalization (including imaging, microbiology, ancillary and laboratory) are listed below for reference.     Microbiology: Recent Results (from the past 240 hour(s))  Urine Culture     Status: None   Collection Time: 05/09/23  6:35 PM   Specimen: Urine, Clean Catch  Result Value Ref Range Status   Specimen Description   Final    URINE, CLEAN CATCH Performed at [facility], [address] [place], [place] [address]    Special Requests   Final    NONE Performed at [facility], [address], [place], [place] [address]    Culture   Final    NO GROWTH Performed at [facility], [address], [place], [place] [address]    Report Status 05/10/2023 FINAL  Final  Culture, blood (routine x 2)     Status: None (Preliminary result)   Collection Time: 05/09/23  7:27 PM   Specimen: BLOOD  Result Value Ref Range Status   Specimen Description   Final    BLOOD RIGHT ANTECUBITAL Performed at [facility], [address], [place], [place] [address]    Special Requests   Final    Blood Culture adequate volume BOTTLES DRAWN AEROBIC AND ANAEROBIC Performed at [facility], [address], [place], [place] [address]    Culture   Final    NO GROWTH 1 DAY Performed at [facility], [address], [place], [place] [address]    Report Status PENDING  Incomplete  Culture, blood (routine x 2)     Status: None (Preliminary result)   Collection Time: 05/09/23  7:55 PM   Specimen: BLOOD  Result Value Ref Range Status   Specimen Description   Final    BLOOD RIGHT ANTECUBITAL Performed at [facility], [address] [place], [place] [address]    Special Requests   Final    Blood Culture adequate volume BOTTLES DRAWN AEROBIC AND  ANAEROBIC Performed at [facility], [address] [place], [place] [address]    Culture   Final    NO GROWTH 1 DAY Performed at [facility], [address] [place], [place] [address]    Report Status PENDING  Incomplete     Labs: BNP (last 3 results) No results for input(s): "BNP" in the last 8760 hours. Basic Metabolic Panel: Recent Labs  Lab 05/09/23 1900 05/10/23 0026 05/10/23 0600  NA 135  --  136  K 3.7  --  4.2  CL 102  --  106  CO2 23  --  23  GLUCOSE 90  --  129*  BUN 23  --  21  CREATININE 1.23*  --  1.19*  CALCIUM 8.7*  --  8.4*  MG  --  2.5* 2.6*  PHOS  --  3.5 4.0   Liver Function Tests: Recent Labs  Lab 05/09/23 1900 05/10/23 0600  AST 21 18  ALT 19 17  ALKPHOS 56 52  BILITOT 0.3 0.3  PROT 7.4 6.8  ALBUMIN 3.6 3.1*   No results for input(s): "LIPASE", "AMYLASE" in the last 168 hours. No results for input(s): "AMMONIA" in the last 168 hours. CBC: Recent Labs  Lab 05/09/23 1900 05/10/23 0600  WBC 8.3 9.9  NEUTROABS 6.8  --   HGB 11.4* 10.8*  HCT 36.0 33.7*  MCV 95.5 94.7  PLT 326 284   Cardiac Enzymes: Recent Labs  Lab 05/10/23 0026  CKTOTAL 85   BNP: Invalid input(s): "POCBNP" CBG: No results for input(s): "GLUCAP" in the last 168 hours. D-Dimer No results for input(s): "DDIMER" in the last 72 hours. Hgb A1c No results for input(s): "HGBA1C" in the last 72 hours. Lipid Profile No results for input(s): "CHOL", "HDL", "LDLCALC", "TRIG", "CHOLHDL", "LDLDIRECT" in the last 72 hours. Thyroid function studies Recent Labs    05/10/23 0026  TSH 89.854*   Anemia work up No results for input(s): "VITAMINB12", "FOLATE", "FERRITIN", "TIBC", "IRON", "RETICCTPCT" in the last 72 hours. Urinalysis    Component Value Date/Time   COLORURINE RED (A) 05/09/2023 1835   APPEARANCEUR CLEAR 05/09/2023 1835   LABSPEC 1.004 (L) 05/09/2023 1835   PHURINE 6.0 05/09/2023 1835   GLUCOSEU NEGATIVE 05/09/2023 1835    HGBUR MODERATE (A) 05/09/2023 1835   BILIRUBINUR NEGATIVE 05/09/2023 1835   KETONESUR NEGATIVE 05/09/2023 1835   PROTEINUR 100 (A) 05/09/2023 1835   NITRITE NEGATIVE 05/09/2023 1835   LEUKOCYTESUR NEGATIVE 05/09/2023 1835   Sepsis Labs Recent Labs  Lab 05/09/23 1900 05/10/23 0600  WBC 8.3 9.9   Microbiology Recent Results (from the past 240 hour(s))  Urine Culture     Status: None   Collection Time: 05/09/23  6:35 PM   Specimen: Urine, Clean Catch  Result Value Ref Range Status   Specimen Description   Final    URINE, CLEAN CATCH Performed at [facility], [address], [place], [place] [address]    Special Requests   Final    NONE Performed at [facility], [address], [place], [place] [address]    Culture   Final    NO GROWTH Performed at [facility], [address], [place], [place] [address]    Report Status 05/10/2023 FINAL  Final  Culture, blood (routine x 2)     Status: None (Preliminary result)   Collection Time: 05/09/23  7:27 PM   Specimen: BLOOD  Result Value Ref Range Status   Specimen Description   Final    BLOOD RIGHT ANTECUBITAL Performed at [facility], [address], [place], [place] [address]    Special Requests   Final    Blood Culture adequate volume BOTTLES DRAWN AEROBIC AND ANAEROBIC Performed at [facility], [address], [place], [place] [address]    Culture   Final    NO GROWTH 1 DAY Performed at [facility], [address] [place], [place] [address]    Report Status PENDING  Incomplete  Culture, blood (routine x 2)     Status: None (Preliminary result)   Collection Time: 05/09/23  7:55 PM   Specimen: BLOOD  Result Value Ref Range Status   Specimen Description   Final    BLOOD RIGHT ANTECUBITAL Performed at [facility], [address], [place], [place] [address]    Special Requests   Final    Blood Culture  adequate volume BOTTLES DRAWN AEROBIC AND ANAEROBIC Performed at [facility], [address] [place], [place] [address]    Culture   Final    NO GROWTH 1 DAY Performed at [facility], [address], [place], [place] [address]    Report Status PENDING  Incomplete     Time coordinating discharge: 39 minutes  SIGNED:38 MIN  Alwyn Ren, MD  [facility] 05/11/2023, 3:17 PM

## 2023-05-12 ENCOUNTER — Encounter (HOSPITAL_COMMUNITY): Payer: Self-pay | Admitting: Urology

## 2023-05-12 ENCOUNTER — Other Ambulatory Visit: Payer: Self-pay | Admitting: Urology

## 2023-05-12 ENCOUNTER — Telehealth: Payer: Self-pay | Admitting: *Deleted

## 2023-05-12 ENCOUNTER — Telehealth: Payer: Self-pay

## 2023-05-12 DIAGNOSIS — D49511 Neoplasm of unspecified behavior of right kidney: Secondary | ICD-10-CM | POA: Diagnosis not present

## 2023-05-12 DIAGNOSIS — N151 Renal and perinephric abscess: Secondary | ICD-10-CM | POA: Diagnosis not present

## 2023-05-12 DIAGNOSIS — R310 Gross hematuria: Secondary | ICD-10-CM | POA: Diagnosis not present

## 2023-05-12 NOTE — Patient Instructions (Addendum)
SURGICAL WAITING ROOM VISITATION  Patients having surgery or a procedure may have no more than 2 support people in the waiting area - these visitors may rotate.    Children under the age of 93 must have an adult with them who is not the patient.  Due to an increase in RSV and influenza rates and associated hospitalizations, children ages 46 and under may not visit patients in [facility] hospitals.  If the patient needs to stay at the hospital during part of their recovery, the visitor guidelines for inpatient rooms apply. Pre-op nurse will coordinate an appropriate time for 1 support person to accompany patient in pre-op.  This support person may not rotate.    Please refer to the [facility] website for the visitor guidelines for Inpatients (after your surgery is over and you are in a regular room).       Your procedure is scheduled on: Friday, Dec. 6, 2024   Report to [facility] Main Entrance    Report to admitting at 12:30 PM   Call this number if you have problems the morning of surgery [phone]   Do not eat food or drink :After Midnight.  FOLLOW BOWEL PREP AND ANY ADDITIONAL PRE OP INSTRUCTIONS YOU RECEIVED FROM YOUR SURGEON'S OFFICE!!!     Oral Hygiene is also important to reduce your risk of infection.                                    Remember - BRUSH YOUR TEETH THE MORNING OF SURGERY WITH YOUR REGULAR TOOTHPASTE  DENTURES WILL BE REMOVED PRIOR TO SURGERY PLEASE DO NOT APPLY "Poly grip" OR ADHESIVES!!!   Do NOT smoke after Midnight   Stop all vitamins and herbal supplements 7 days before surgery.   Take these medicines the morning of surgery with A SIP OF WATER: Levothyroxine   Bring Asthma Inhaler                              You may not have any metal on your body including hair pins, jewelry, and body piercing             Do not wear make-up, lotions, powders, perfumes/cologne, or deodorant  Do not wear nail polish including gel and S&S,  artificial/acrylic nails, or any other type of covering on natural nails including finger and toenails. If you have artificial nails, gel coating, etc. that needs to be removed by a nail salon please have this removed prior to surgery or surgery may need to be canceled/ delayed if the surgeon/ anesthesia feels like they are unable to be safely monitored.   Do not shave  48 hours prior to surgery.    Do not bring valuables to the hospital. [facility] IS NOT             RESPONSIBLE   FOR VALUABLES.   Contacts, glasses, dentures or bridgework may not be worn into surgery.   Bring small overnight bag day of surgery.   DO NOT BRING YOUR HOME MEDICATIONS TO THE HOSPITAL. PHARMACY WILL DISPENSE MEDICATIONS LISTED ON YOUR MEDICATION LIST TO YOU DURING YOUR ADMISSION IN THE HOSPITAL!    Patients discharged on the day of surgery will not be allowed to drive home.  Someone NEEDS to stay with you for the first 24 hours after anesthesia.   Special  Instructions: Bring a copy of your healthcare power of attorney and living will documents the day of surgery if you haven't scanned them before.              Please read over the following fact sheets you were given: IF YOU HAVE QUESTIONS ABOUT YOUR PRE-OP INSTRUCTIONS PLEASE CALL [phone]   If you received a COVID test during your pre-op visit  it is requested that you wear a mask when out in public, stay away from anyone that may not be feeling well and notify your surgeon if you develop symptoms. If you test positive for Covid or have been in contact with anyone that has tested positive in the last 10 days please notify you surgeon.    [place] - Preparing for Surgery Before surgery, you can play an important role.  Because skin is not sterile, your skin needs to be as free of germs as possible.  You can reduce the number of germs on your skin by washing with CHG (chlorahexidine gluconate) soap before surgery.  CHG is an antiseptic cleaner which kills  germs and bonds with the skin to continue killing germs even after washing. Please DO NOT use if you have an allergy to CHG or antibacterial soaps.  If your skin becomes reddened/irritated stop using the CHG and inform your nurse when you arrive at Short Stay. Do not shave (including legs and underarms) for at least 48 hours prior to the first CHG shower.  You may shave your face/neck.  Please follow these instructions carefully:  1.  Shower with CHG Soap the night before surgery and the  morning of surgery.  2.  If you choose to wash your hair, wash your hair first as usual with your normal  shampoo.  3.  After you shampoo, rinse your hair and body thoroughly to remove the shampoo.                             4.  Use CHG as you would any other liquid soap.  You can apply chg directly to the skin and wash.  Gently with a scrungie or clean washcloth.  5.  Apply the CHG Soap to your body ONLY FROM THE NECK DOWN.   Do   not use on face/ open                           Wound or open sores. Avoid contact with eyes, ears mouth and   genitals (private parts).                       Wash face,  Genitals (private parts) with your normal soap.             6.  Wash thoroughly, paying special attention to the area where your    surgery  will be performed.  7.  Thoroughly rinse your body with warm water from the neck down.  8.  DO NOT shower/wash with your normal soap after using and rinsing off the CHG Soap.                9.  Pat yourself dry with a clean towel.            10.  Wear clean pajamas.            11.  Place clean sheets on your  bed the night of your first shower and do not  sleep with pets. Day of Surgery : Do not apply any lotions/deodorants the morning of surgery.  Please wear clean clothes to the hospital/surgery center.  FAILURE TO FOLLOW THESE INSTRUCTIONS MAY RESULT IN THE CANCELLATION OF YOUR SURGERY  PATIENT SIGNATURE_________________________________  NURSE  SIGNATURE__________________________________  ________________________________________________________________________

## 2023-05-12 NOTE — Transitions of Care (Post Inpatient/ED Visit) (Signed)
05/12/2023  Name: Evelyn Jordan MRN: [id number] DOB: May 09, 1951  Today's TOC FU Call Status: Today's TOC FU Call Status:: Successful TOC FU Call Completed TOC FU Call Complete Date: 05/12/23 Patient's Name and Date of Birth confirmed.  Transition Care Management Follow-up Telephone Call Date of Discharge: 05/11/23 Discharge Facility: [facility]) Type of Discharge: Inpatient Admission Primary Inpatient Discharge Diagnosis:: right renal mass How have you been since you were released from the hospital?: Same Any questions or concerns?: No  Items Reviewed: Did you receive and understand the discharge instructions provided?: Yes Medications obtained,verified, and reconciled?: Yes (Medications Reviewed) Any new allergies since your discharge?: No Dietary orders reviewed?: No Do you have support at home?: Yes People in Home: spouse Name of Support/Comfort Primary Source: Archie  Medications Reviewed Today: Medications Reviewed Today     Reviewed by Luella Cook, RN (Case Manager) on 05/12/23 at 1340  Med List Status: <None>   Medication Order Taking? Sig Documenting Provider Last Dose Status Informant  acetaminophen (TYLENOL) 325 MG tablet [id number] Yes Take 2 tablets (650 mg total) by mouth every 6 (six) hours as needed for mild pain (pain score 1-3) (or Fever >/= 101). Alwyn Ren, MD Taking Active Self  albuterol (VENTOLIN HFA) 108 (90 Base) MCG/ACT inhaler [id number] Yes Inhale 2 puffs into the lungs every 6 (six) hours as needed for wheezing or shortness of breath. Leslye Peer, MD Taking Active Self  bisacodyl (DULCOLAX) 5 MG EC tablet [id number] Yes Take 10-15 mg by mouth daily as needed for moderate constipation. [provider] Taking Active Self  ciprofloxacin (CIPRO) 500 MG tablet [id number] Yes Take 500 mg by mouth 2 (two) times daily. [provider] Taking Active Self  HYDROcodone-acetaminophen (NORCO/VICODIN) 5-325 MG tablet  [id number] Yes Take 1-2 tablets by mouth every 4 (four) hours as needed for moderate pain (pain score 4-6). Alwyn Ren, MD Taking Active Self  ibuprofen (ADVIL) 200 MG tablet [id number] Yes Take 800 mg by mouth every 6 (six) hours as needed for moderate pain (pain score 4-6). [provider] Taking Active Self  levothyroxine (SYNTHROID) 75 MCG tablet [id number] Yes Take 1 tablet (75 mcg total) by mouth daily at 6 (six) AM. Alwyn Ren, MD Taking Active Self  Multiple Vitamin (MULTIVITAMIN WITH MINERALS) TABS tablet [id number] Yes Take 1 tablet by mouth daily. [provider] Taking Active Self  Psyllium (METAMUCIL PO) [id number] Yes Take 2 capsules by mouth daily. [provider] Taking Active Self            Home Care and Equipment/Supplies: Were Home Health Services Ordered?: NA Any new equipment or medical supplies ordered?: NA  Functional Questionnaire: Do you need assistance with bathing/showering or dressing?: No Do you need assistance with meal preparation?: No Do you need assistance with eating?: No Do you have difficulty maintaining continence: No Do you need assistance with getting out of bed/getting out of a chair/moving?: No Do you have difficulty managing or taking your medications?: No  Follow up appointments reviewed: PCP Follow-up appointment confirmed?: Yes Date of PCP follow-up appointment?: 05/19/23 Follow-up Provider: Dr Asencion Partridge Specialist [facility] Follow-up appointment confirmed?: Yes Date of Specialist follow-up appointment?: 05/12/23 Follow-Up Specialty Provider:: Dr Liliane Shi Do you need transportation to your follow-up appointment?: No Do you understand care options if your condition(s) worsen?: Yes-patient verbalized understanding  SDOH Interventions Today    Flowsheet Row Most Recent Value  SDOH Interventions   Food Insecurity Interventions Intervention Not Indicated  Housing  Interventions Intervention Not  Indicated  Transportation Interventions Intervention Not Indicated, Patient Resources (Friends/Family)  Utilities Interventions Intervention Not Indicated      Care guide scheduled f/u PCP visit Patient is have PAT tomorrow Patient is having some pain. The pain medication is helping Patient has transportation to appointments TOC interventions discussed/reviewed: -Discussed/reviewed insurance/health plans benefits no transportation benefit needed -Doctor visit discussed/reviewed -PCP 12/10 Dr Mardelle Matte -Doctor visits discussed/reviewed-Specialist 1203 Dr Liliane Shi -Provided Verbal Education: 30-day TOC program, nutrition, meds & their functions, symptom mgmt., fall/safety measures in the home   Gean Maidens BSN RN Population Health- Transition of Care Team.  [facility] [phone]

## 2023-05-12 NOTE — Transitions of Care (Post Inpatient/ED Visit) (Signed)
   05/12/2023  Name: Evelyn Jordan MRN: [id number] DOB: 01-27-1951  Today's TOC FU Call Status: Today's TOC FU Call Status:: Unsuccessful Call (1st Attempt) Unsuccessful Call (1st Attempt) Date: 05/12/23 (Spoke with pt-requested call back later as she was "out eating breakfast.")  Attempted to reach the patient regarding the most recent Inpatient/ED visit.  Follow Up Plan: Additional outreach attempts will be made to reach the patient to complete the Transitions of Care (Post Inpatient/ED visit) call.     Antionette Fairy, RN,BSN,CCM RN Care Manager Transitions of Care  [facility]/Population [facility]  Direct Phone: [phone] Toll Free: [phone] Fax: [phone]

## 2023-05-13 ENCOUNTER — Encounter (HOSPITAL_COMMUNITY)
Admission: RE | Admit: 2023-05-13 | Discharge: 2023-05-13 | Disposition: A | Discharge status: Home / Self Care | Payer: Medicare Other | Source: Ambulatory Visit | Attending: Urology | Admitting: Urology

## 2023-05-13 ENCOUNTER — Encounter (HOSPITAL_COMMUNITY): Payer: Self-pay

## 2023-05-13 DIAGNOSIS — Z85118 Personal history of other malignant neoplasm of bronchus and lung: Secondary | ICD-10-CM | POA: Insufficient documentation

## 2023-05-13 DIAGNOSIS — I700 Atherosclerosis of aorta: Secondary | ICD-10-CM | POA: Diagnosis not present

## 2023-05-13 DIAGNOSIS — K5790 Diverticulosis of intestine, part unspecified, without perforation or abscess without bleeding: Secondary | ICD-10-CM | POA: Insufficient documentation

## 2023-05-13 DIAGNOSIS — R918 Other nonspecific abnormal finding of lung field: Secondary | ICD-10-CM | POA: Insufficient documentation

## 2023-05-13 DIAGNOSIS — F32A Depression, unspecified: Secondary | ICD-10-CM | POA: Insufficient documentation

## 2023-05-13 DIAGNOSIS — I3139 Other pericardial effusion (noninflammatory): Secondary | ICD-10-CM | POA: Insufficient documentation

## 2023-05-13 DIAGNOSIS — Z87891 Personal history of nicotine dependence: Secondary | ICD-10-CM | POA: Diagnosis not present

## 2023-05-13 DIAGNOSIS — K5900 Constipation, unspecified: Secondary | ICD-10-CM | POA: Insufficient documentation

## 2023-05-13 DIAGNOSIS — I2510 Atherosclerotic heart disease of native coronary artery without angina pectoris: Secondary | ICD-10-CM | POA: Diagnosis not present

## 2023-05-13 DIAGNOSIS — R310 Gross hematuria: Secondary | ICD-10-CM | POA: Diagnosis not present

## 2023-05-13 DIAGNOSIS — N151 Renal and perinephric abscess: Secondary | ICD-10-CM | POA: Diagnosis not present

## 2023-05-13 DIAGNOSIS — Z01818 Encounter for other preprocedural examination: Secondary | ICD-10-CM | POA: Diagnosis not present

## 2023-05-13 DIAGNOSIS — E039 Hypothyroidism, unspecified: Secondary | ICD-10-CM | POA: Diagnosis not present

## 2023-05-13 DIAGNOSIS — N2889 Other specified disorders of kidney and ureter: Secondary | ICD-10-CM | POA: Diagnosis not present

## 2023-05-13 HISTORY — DX: Fatty (change of) liver, not elsewhere classified: K76.0

## 2023-05-13 NOTE — Patient Instructions (Addendum)
SURGICAL WAITING ROOM VISITATION  Patients having surgery or a procedure may have no more than 2 support people in the waiting area - these visitors may rotate.    Children under the age of 78 must have an adult with them who is not the patient.  Due to an increase in RSV and influenza rates and associated hospitalizations, children ages 1 and under may not visit patients in [facility] hospitals.  If the patient needs to stay at the hospital during part of their recovery, the visitor guidelines for inpatient rooms apply. Pre-op nurse will coordinate an appropriate time for 1 support person to accompany patient in pre-op.  This support person may not rotate.    Please refer to the [facility] website for the visitor guidelines for Inpatients (after your surgery is over and you are in a regular room).    Your procedure is scheduled on: 05/15/23   Report to [facility] Main Entrance    Report to admitting at 12:30 PM   Call this number if you have problems the morning of surgery [phone]   Do not eat food or drink liquids:After Midnight.          If you have questions, please contact your surgeon's office.   FOLLOW BOWEL PREP AND ANY ADDITIONAL PRE OP INSTRUCTIONS YOU RECEIVED FROM YOUR SURGEON'S OFFICE!!!     Oral Hygiene is also important to reduce your risk of infection.                                    Remember - BRUSH YOUR TEETH THE MORNING OF SURGERY WITH YOUR REGULAR TOOTHPASTE  DENTURES WILL BE REMOVED PRIOR TO SURGERY PLEASE DO NOT APPLY "Poly grip" OR ADHESIVES!!!   Stop all vitamins and herbal supplements 7 days before surgery.   Take these medicines the morning of surgery with A SIP OF WATER: Bring Inhaler, Levothyroxine, ciprofloxacin, Oxycodone                               You may not have any metal on your body including hair pins, jewelry, and body piercing             Do not wear make-up, lotions, powders, perfumes, or deodorant  Do not wear  nail polish including gel and S&S, artificial/acrylic nails, or any other type of covering on natural nails including finger and toenails. If you have artificial nails, gel coating, etc. that needs to be removed by a nail salon please have this removed prior to surgery or surgery may need to be canceled/ delayed if the surgeon/ anesthesia feels like they are unable to be safely monitored.   Do not shave  48 hours prior to surgery.    Do not bring valuables to the hospital. [facility] IS NOT             RESPONSIBLE   FOR VALUABLES.   Contacts, glasses, dentures or bridgework may not be worn into surgery.  DO NOT BRING YOUR HOME MEDICATIONS TO THE HOSPITAL. PHARMACY WILL DISPENSE MEDICATIONS LISTED ON YOUR MEDICATION LIST TO YOU DURING YOUR ADMISSION IN THE HOSPITAL!    Patients discharged on the day of surgery will not be allowed to drive home.  Someone NEEDS to stay with you for the first 24 hours after anesthesia.   Special Instructions: Bring a copy  of your healthcare power of attorney and living will documents the day of surgery if you haven't scanned them before.              Please read over the following fact sheets you were given: IF YOU HAVE QUESTIONS ABOUT YOUR PRE-OP INSTRUCTIONS PLEASE CALL [name]   If you received a COVID test during your pre-op visit  it is requested that you wear a mask when out in public, stay away from anyone that may not be feeling well and notify your surgeon if you develop symptoms. If you test positive for Covid or have been in contact with anyone that has tested positive in the last 10 days please notify you surgeon.    [facility] - Preparing for Surgery Before surgery, you can play an important role.  Because skin is not sterile, your skin needs to be as free of germs as possible.  You can reduce the number of germs on your skin by washing with CHG (chlorahexidine gluconate) soap before surgery.  CHG is an antiseptic cleaner which kills  germs and bonds with the skin to continue killing germs even after washing. Please DO NOT use if you have an allergy to CHG or antibacterial soaps.  If your skin becomes reddened/irritated stop using the CHG and inform your nurse when you arrive at Short Stay. Do not shave (including legs and underarms) for at least 48 hours prior to the first CHG shower.  You may shave your face/neck.  Please follow these instructions carefully:  1.  Shower with CHG Soap the night before surgery and the  morning of surgery.  2.  If you choose to wash your hair, wash your hair first as usual with your normal  shampoo.  3.  After you shampoo, rinse your hair and body thoroughly to remove the shampoo.                             4.  Use CHG as you would any other liquid soap.  You can apply chg directly to the skin and wash.  Gently with a scrungie or clean washcloth.  5.  Apply the CHG Soap to your body ONLY FROM THE NECK DOWN.   Do   not use on face/ open                           Wound or open sores. Avoid contact with eyes, ears mouth and   genitals (private parts).                       Wash face,  Genitals (private parts) with your normal soap.             6.  Wash thoroughly, paying special attention to the area where your    surgery  will be performed.  7.  Thoroughly rinse your body with warm water from the neck down.  8.  DO NOT shower/wash with your normal soap after using and rinsing off the CHG Soap.                9.  Pat yourself dry with a clean towel.            10.  Wear clean pajamas.            11.  Place clean sheets on your bed the night  of your first shower and do not  sleep with pets. Day of Surgery : Do not apply any lotions/deodorants the morning of surgery.  Please wear clean clothes to the hospital/surgery center.  FAILURE TO FOLLOW THESE INSTRUCTIONS MAY RESULT IN THE CANCELLATION OF YOUR SURGERY  PATIENT SIGNATURE_________________________________  NURSE  SIGNATURE__________________________________  ________________________________________________________________________

## 2023-05-13 NOTE — Progress Notes (Signed)
COVID Vaccine Completed: yes  Date of COVID positive in last 90 days: no  PCP - Asencion Partridge, MD LOV 04/30/23 Cardiologist - Tessa Lerner, MD LOV 01/29/23 Oncologist- Si Gaul, MD LOV 03/11/23 Pulmonologist- Levy Pupa, MD LOV 04/09/23  Chest x-ray - 12/22/22 Epic EKG - 01/29/23 Epic Stress Test - n/a ECHO - 03/05/23 Epic Cardiac Cath - n/a Pacemaker/ICD device last checked: n/a Spinal Cord Stimulator: n/a  Bowel Prep - no  Sleep Study - n/a CPAP -   Fasting Blood Sugar - n/a Checks Blood Sugar _____ times a day  Last dose of GLP1 agonist-  N/A GLP1 instructions:  Hold 7 days before surgery    Last dose of SGLT-2 inhibitors-  N/A SGLT-2 instructions:  Hold 3 days before surgery    Blood Thinner Instructions:  n/a Aspirin Instructions: Last Dose:  Activity level: Can go up a flight of stairs and perform activities of daily living without stopping and without symptoms of chest pain or shortness of breath.  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 Patient was also instructed that they will need to review over the PAT instructions again at home before surgery.

## 2023-05-13 NOTE — Anesthesia Preprocedure Evaluation (Addendum)
Anesthesia Evaluation  Patient identified by MRN, date of birth, ID band Patient awake    Reviewed: Allergy & Precautions, NPO status , Patient's Chart, lab work & pertinent test results  History of Anesthesia Complications Negative for: history of anesthetic complications  Airway Mallampati: III  TM Distance: >3 FB Neck ROM: Full    Dental  (+) Dental Advisory Given Permanent bridge top and bottom front:   Pulmonary asthma , neg sleep apnea, neg COPD, neg recent URI, former smoker Non-small cell lung cancer s/p chemo radiation   Pulmonary exam normal breath sounds clear to auscultation       Cardiovascular (-) hypertension(-) angina (-) Past MI, (-) Cardiac Stents and (-) CABG negative cardio ROS (-) dysrhythmias  Rhythm:Regular Rate:Normal  IMPRESSIONS     1. Left ventricular ejection fraction, by estimation, is 55 to 60%. Left  ventricular ejection fraction by 3D volume is 59 %. The left ventricle has  normal function. The left ventricle has no regional wall motion  abnormalities. Left ventricular diastolic   parameters are consistent with Grade I diastolic dysfunction (impaired  relaxation).   2. Right ventricular systolic function is mildly reduced. The right  ventricular size is normal. There is normal pulmonary artery systolic  pressure. The estimated right ventricular systolic pressure is 15.1 mmHg.   3. A small pericardial effusion is present. The pericardial effusion is  circumferential. There is no evidence of cardiac tamponade.   4. The mitral valve is abnormal. Trivial mitral valve regurgitation.   5. The aortic valve is tricuspid. Aortic valve regurgitation is not  visualized.   6. The inferior vena cava is normal in size with greater than 50%  respiratory variability, suggesting right atrial pressure of 3 mmHg.     Neuro/Psych  PSYCHIATRIC DISORDERS  Depression    negative neurological ROS      GI/Hepatic negative GI ROS, Neg liver ROS,,,  Endo/Other  neg diabetesHypothyroidism  Thyroid nodule  Renal/GU      Musculoskeletal osteoporosis   Abdominal   Peds  Hematology negative hematology ROS (+)   Anesthesia Other Findings Recent admission with aki / sepsis. Renal mass identified, felt to be malignancy.   Reproductive/Obstetrics                              Anesthesia Physical Anesthesia Plan  ASA: 3  Anesthesia Plan: General   Post-op Pain Management:    Induction: Intravenous  PONV Risk Score and Plan: 3 and Ondansetron and Treatment may vary due to age or medical condition  Airway Management Planned: LMA  Additional Equipment:   Intra-op Plan:   Post-operative Plan: Extubation in OR  Informed Consent: I have reviewed the patients History and Physical, chart, labs and discussed the procedure including the risks, benefits and alternatives for the proposed anesthesia with the patient or authorized representative who has indicated his/her understanding and acceptance.     Dental advisory given  Plan Discussed with: CRNA and Anesthesiologist  Anesthesia Plan Comments: (See PAT note from 12/4 by K Gekas PA-C  Evelyn Jordan is a 72 yo female who presents to PAT prior to CYSTOSCOPY, RIGHT RETROGRADE AND RIGHT URETEROSCOPY and BIOPSY on 05/15/23 with Dr. Liliane Shi. PMH of former smoking (quit 2012), aortic atherosclerosis, lung cancer (dx 07/2022), pericardial effusion, hypothyroidism, depression.   Patient diagnosed with left lung cancer in Feb 2024 after developing hemoptysis. Underwent a bronch and biopsy on 08/04/22 and pathology was positive for  squamous cell carcinoma. She is s/p chemo and radiation. Has a port a cath. She currently follows with Oncology, last seen on 03/11/23. She is currently being observed with CT scans every 3 months. Last CT Chest on 03/05/23 showed mass appeared smaller.    She started having abdominal pain in Nov  2024 and CT A/P was done which showed R renal mass. She was admitted from 11/30-12/2 due to AKI and treated for possible infection however it is favored that this is likely a malignant mass. Urology recommending above procedure.   Patient follows with Pulmonology as well. Last seen on 04/09/23 by Dr. 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 PFTs ordered. Advised to treat GERD and will trial albuterol. It is thought her SOB maybe be due to scarring from radiation.   Patient seen by Cardiology on 01/29/23 due to pericardial effusion seen on CT. Echo was done on 03/05/23 which showed small pericardial effusion. It is noted she is asymptomatic regarding this and it is being observed. )         Anesthesia Quick Evaluation

## 2023-05-13 NOTE — Progress Notes (Signed)
DISCUSSION: Evelyn Jordan is a 72 yo female who presents to PAT prior to CYSTOSCOPY, RIGHT RETROGRADE AND RIGHT URETEROSCOPY and BIOPSY on 05/15/23 with Dr. Liliane Shi. PMH of former smoking (quit 2012), aortic atherosclerosis, lung cancer (dx 07/2022), pericardial effusion, hypothyroidism, depression.  Patient diagnosed with left lung cancer in Feb 2024 after developing hemoptysis. 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 She is s/p chemo and radiation. Has a port a cath. She currently follows with Oncology, last seen on 03/11/23. She is currently being observed with CT scans every 3 months. Last CT Chest on 03/05/23 showed mass appeared smaller.   She started having abdominal pain in Nov 2024 and CT A/P was done which showed R renal mass. She was admitted from 11/30-12/2 due to AKI and treated for possible infection however it is favored that this is likely a malignant mass. Urology recommending above procedure.  Patient follows with Pulmonology as well. Last seen on 04/09/23 by Dr. 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 PFTs ordered. Advised to treat GERD and will trial albuterol. It is thought her SOB maybe be due to scarring from radiation.  Patient seen by Cardiology on 01/29/23 due to pericardial effusion seen on CT. Echo was done on 03/05/23 which showed small pericardial effusion. It is noted she is asymptomatic regarding this and it is being observed.   VS: BP (!) 133/92   Pulse 88   Temp 36.9 C (Oral)   Resp 16   Ht 5\' 3"  (1.6 m)   Wt 54.7 kg   SpO2 100%   BMI 21.36 kg/m   PROVIDERS: PCP - Asencion Partridge, MD LOV 04/30/23 Cardiologist - Tessa Lerner, MD LOV 01/29/23 Oncologist- Si Gaul, MD LOV 03/11/23 Pulmonologist- Levy Pupa, MD LOV 04/09/23  LABS: Labs reviewed: Acceptable for surgery. (all labs ordered are listed, but only abnormal results are displayed)  Labs Reviewed - No data to  display   IMAGES: CT A/P 05/09/23:  IMPRESSION: 1. 6.1 x 5.4 cm area of heterogeneous hypoenhancement in the posterior mid to lower right kidney with increased appearance of central liquefaction necrosis from 8 days ago. There is again lack of excretion from the involved parenchyma into the calices, and mild urothelial thickening in the adjacent right renal pelvis. Infection with potentially a developing renal abscess and tumor could both have this appearance, with infection favored given that this was not present on 03/05/2023. 2. Further supporting an infectious etiology, there is again noted faint hypoenhancement over portions of the posterior mid left kidney best seen on the delayed images and likely indicating pyelonephritis. 3. Stable 2.8 x 2.3 cm centrally necrotic retrocaval lymph node at the level of the right kidney, again partially effacing the IVC and displacing the right renal artery slightly anteriorly without effacement. 4. 3 lung nodules in the lower lobes, largest 6 mm in the right lower lobe, concerning for metastases. 5. Constipation and diverticulosis. 6. Aortic atherosclerosis.   Aortic Atherosclerosis (ICD10-I70.0).  CT Chest 03/05/23:   IMPRESSION: 1. The original lingular mass is obscured by radiation therapy changes, but the mass appears smaller than on the prior exam. 2. No findings of metastatic disease. 3. Stable small to moderate pericardial effusion. 4. 1 cm enhancing lesion in the dome of the right hepatic lobe is stable from 07/24/2022 and not hypermetabolic on interval PET-CT, favoring a benign lesion such as hemangioma. 5. Aortic and coronary atherosclerosis.   Aortic Atherosclerosis (ICD10-I70.0).  EKG:   CV:  Echo 03/05/23:  IMPRESSIONS    1. Left ventricular ejection fraction, by estimation, is 55 to 60%. Left ventricular ejection fraction by 3D volume is 59 %. The left ventricle has normal function. The left ventricle has  no regional wall motion abnormalities. Left ventricular diastolic  parameters are consistent with Grade I diastolic dysfunction (impaired relaxation).  2. Right ventricular systolic function is mildly reduced. The right ventricular size is normal. There is normal pulmonary artery systolic pressure. The estimated right ventricular systolic pressure is 15.1 mmHg.  3. A small pericardial effusion is present. The pericardial effusion is circumferential. There is no evidence of cardiac tamponade.  4. The mitral valve is abnormal. Trivial mitral valve regurgitation.  5. The aortic valve is tricuspid. Aortic valve regurgitation is not visualized.  6. The inferior vena cava is normal in size with greater than 50% respiratory variability, suggesting right atrial pressure of 3 mmHg.  Comparison(s): 04/23/2017: LVEF 60-65%, no pericardial effusion.  CT 01/01/2023 - small pericardial effusion.  Past Medical History:  Diagnosis Date   Depression    Fatty liver    Insomnia    Lung cancer (HCC) 07/25/2022   Osteoporosis    Pericardial effusion 03/05/2023   small noted on ECHO   Pulmonary nodule    Thyroid nodule     Past Surgical History:  Procedure Laterality Date   ANKLE SURGERY Right    BREAST BIOPSY Right    benign 2013   BRONCHIAL BIOPSY  08/04/2022   Procedure: BRONCHIAL BIOPSIES;  Surgeon: Leslye Peer, MD;  Location: [facility] ENDOSCOPY;  Service: Cardiopulmonary;;   BRONCHIAL BRUSHINGS  08/04/2022   Procedure: BRONCHIAL BRUSHINGS;  Surgeon: Leslye Peer, MD;  Location: [facility] ENDOSCOPY;  Service: Cardiopulmonary;;   COLONOSCOPY     HEMOSTASIS CONTROL  08/04/2022   Procedure: HEMOSTASIS CONTROL;  Surgeon: Leslye Peer, MD;  Location: [facility] ENDOSCOPY;  Service: Cardiopulmonary;;   IR IMAGING GUIDED PORT INSERTION  09/04/2022   repair of a torn meniscus Left 05/2018   VIDEO BRONCHOSCOPY Left 08/04/2022   Procedure: VIDEO BRONCHOSCOPY WITH FLUORO;  Surgeon: Leslye Peer, MD;   Location: [facility] ENDOSCOPY;  Service: Cardiopulmonary;  Laterality: Left;    MEDICATIONS:  acetaminophen (TYLENOL) 325 MG tablet   albuterol (VENTOLIN HFA) 108 (90 Base) MCG/ACT inhaler   bisacodyl (DULCOLAX) 5 MG EC tablet   ciprofloxacin (CIPRO) 500 MG tablet   HYDROcodone-acetaminophen (NORCO/VICODIN) 5-325 MG tablet   ibuprofen (ADVIL) 200 MG tablet   levothyroxine (SYNTHROID) 75 MCG tablet   Multiple Vitamin (MULTIVITAMIN WITH MINERALS) TABS tablet   Psyllium (METAMUCIL PO)   No current facility-administered medications for this encounter.   Marcille Blanco [facility] Surgical Short Stay/Anesthesiology [facility] Phone [phone] 05/13/2023 10:30 AM

## 2023-05-15 ENCOUNTER — Other Ambulatory Visit: Payer: Self-pay

## 2023-05-15 ENCOUNTER — Ambulatory Visit (HOSPITAL_COMMUNITY)
Admission: RE | Admit: 2023-05-15 | Discharge: 2023-05-15 | Disposition: A | Discharge status: Home / Self Care | Payer: Medicare Other | Attending: Urology | Admitting: Urology

## 2023-05-15 ENCOUNTER — Encounter (HOSPITAL_COMMUNITY)
Admission: RE | Disposition: A | Discharge status: Home / Self Care | Payer: Self-pay | Source: Home / Self Care | Attending: Urology

## 2023-05-15 ENCOUNTER — Ambulatory Visit (HOSPITAL_COMMUNITY): Payer: Medicare Other | Admitting: Medical

## 2023-05-15 ENCOUNTER — Ambulatory Visit (HOSPITAL_COMMUNITY): Payer: Medicare Other

## 2023-05-15 ENCOUNTER — Encounter: Payer: Self-pay | Admitting: Internal Medicine

## 2023-05-15 ENCOUNTER — Encounter (HOSPITAL_COMMUNITY): Payer: Self-pay | Admitting: Urology

## 2023-05-15 ENCOUNTER — Ambulatory Visit (HOSPITAL_COMMUNITY): Payer: Medicare Other | Admitting: Certified Registered Nurse Anesthetist

## 2023-05-15 ENCOUNTER — Other Ambulatory Visit: Payer: Self-pay | Admitting: Urology

## 2023-05-15 DIAGNOSIS — Z9221 Personal history of antineoplastic chemotherapy: Secondary | ICD-10-CM | POA: Insufficient documentation

## 2023-05-15 DIAGNOSIS — N2889 Other specified disorders of kidney and ureter: Secondary | ICD-10-CM | POA: Diagnosis not present

## 2023-05-15 DIAGNOSIS — R310 Gross hematuria: Secondary | ICD-10-CM

## 2023-05-15 DIAGNOSIS — Z85118 Personal history of other malignant neoplasm of bronchus and lung: Secondary | ICD-10-CM | POA: Diagnosis not present

## 2023-05-15 DIAGNOSIS — C679 Malignant neoplasm of bladder, unspecified: Secondary | ICD-10-CM | POA: Insufficient documentation

## 2023-05-15 DIAGNOSIS — I3139 Other pericardial effusion (noninflammatory): Secondary | ICD-10-CM | POA: Diagnosis not present

## 2023-05-15 DIAGNOSIS — C651 Malignant neoplasm of right renal pelvis: Secondary | ICD-10-CM | POA: Diagnosis not present

## 2023-05-15 DIAGNOSIS — N289 Disorder of kidney and ureter, unspecified: Secondary | ICD-10-CM | POA: Diagnosis present

## 2023-05-15 DIAGNOSIS — Z923 Personal history of irradiation: Secondary | ICD-10-CM | POA: Diagnosis not present

## 2023-05-15 DIAGNOSIS — M810 Age-related osteoporosis without current pathological fracture: Secondary | ICD-10-CM | POA: Diagnosis not present

## 2023-05-15 DIAGNOSIS — E039 Hypothyroidism, unspecified: Secondary | ICD-10-CM | POA: Insufficient documentation

## 2023-05-15 DIAGNOSIS — Z87891 Personal history of nicotine dependence: Secondary | ICD-10-CM | POA: Insufficient documentation

## 2023-05-15 DIAGNOSIS — N151 Renal and perinephric abscess: Secondary | ICD-10-CM

## 2023-05-15 DIAGNOSIS — D49511 Neoplasm of unspecified behavior of right kidney: Secondary | ICD-10-CM | POA: Diagnosis not present

## 2023-05-15 HISTORY — PX: CYSTOSCOPY WITH BIOPSY: SHX5122

## 2023-05-15 HISTORY — PX: CYSTOSCOPY/RETROGRADE/URETEROSCOPY: SHX5316

## 2023-05-15 LAB — CULTURE, BLOOD (ROUTINE X 2)
Culture: NO GROWTH
Culture: NO GROWTH
Special Requests: ADEQUATE
Special Requests: ADEQUATE

## 2023-05-15 SURGERY — CYSTOSCOPY/RETROGRADE/URETEROSCOPY
Anesthesia: General

## 2023-05-15 MED ORDER — ACETAMINOPHEN 10 MG/ML IV SOLN: 1000.0000 mg | Freq: Once | INTRAVENOUS | Status: DC | PRN

## 2023-05-15 MED ORDER — EPHEDRINE 5 MG/ML INJ
INTRAVENOUS | Status: AC
  Filled 2023-05-15: qty 5

## 2023-05-15 MED ORDER — PHENAZOPYRIDINE HCL 200 MG PO TABS: 200.0000 mg | ORAL_TABLET | Freq: Three times a day (TID) | ORAL | 0 refills | Status: DC | PRN

## 2023-05-15 MED ORDER — CIPROFLOXACIN IN D5W 400 MG/200ML IV SOLN
400.0000 mg | INTRAVENOUS | Status: AC
  Administered 2023-05-15: 400 mg via INTRAVENOUS
  Filled 2023-05-15: qty 200

## 2023-05-15 MED ORDER — EPHEDRINE SULFATE-NACL 50-0.9 MG/10ML-% IV SOSY
PREFILLED_SYRINGE | INTRAVENOUS | Status: DC | PRN
  Administered 2023-05-15: 5 mg via INTRAVENOUS

## 2023-05-15 MED ORDER — LIDOCAINE 2% (20 MG/ML) 5 ML SYRINGE
INTRAMUSCULAR | Status: DC | PRN
  Administered 2023-05-15: 60 mg via INTRAVENOUS

## 2023-05-15 MED ORDER — LIDOCAINE HCL (PF) 2 % IJ SOLN
INTRAMUSCULAR | Status: AC
  Filled 2023-05-15: qty 5

## 2023-05-15 MED ORDER — OXYCODONE HCL 5 MG/5ML PO SOLN: 5.0000 mg | Freq: Once | ORAL | Status: DC | PRN

## 2023-05-15 MED ORDER — ONDANSETRON HCL 4 MG/2ML IJ SOLN
INTRAMUSCULAR | Status: DC | PRN
  Administered 2023-05-15: 4 mg via INTRAVENOUS

## 2023-05-15 MED ORDER — PHENYLEPHRINE 80 MCG/ML (10ML) SYRINGE FOR IV PUSH (FOR BLOOD PRESSURE SUPPORT)
PREFILLED_SYRINGE | INTRAVENOUS | Status: DC | PRN
  Administered 2023-05-15: 120 ug via INTRAVENOUS
  Administered 2023-05-15 (×2): 160 ug via INTRAVENOUS

## 2023-05-15 MED ORDER — LACTATED RINGERS IV SOLN: INTRAVENOUS | Status: DC

## 2023-05-15 MED ORDER — FENTANYL CITRATE PF 50 MCG/ML IJ SOSY: 25.0000 ug | PREFILLED_SYRINGE | INTRAMUSCULAR | Status: DC | PRN

## 2023-05-15 MED ORDER — ONDANSETRON HCL 4 MG/2ML IJ SOLN
INTRAMUSCULAR | Status: AC
  Filled 2023-05-15: qty 2

## 2023-05-15 MED ORDER — OXYCODONE HCL 5 MG PO TABS: 5.0000 mg | ORAL_TABLET | Freq: Once | ORAL | Status: DC | PRN

## 2023-05-15 MED ORDER — PHENYLEPHRINE 80 MCG/ML (10ML) SYRINGE FOR IV PUSH (FOR BLOOD PRESSURE SUPPORT)
PREFILLED_SYRINGE | INTRAVENOUS | Status: AC
  Filled 2023-05-15: qty 10

## 2023-05-15 MED ORDER — DROPERIDOL 2.5 MG/ML IJ SOLN: 0.6250 mg | Freq: Once | INTRAMUSCULAR | Status: DC | PRN

## 2023-05-15 MED ORDER — IOHEXOL 300 MG/ML  SOLN
INTRAMUSCULAR | Status: DC | PRN
  Administered 2023-05-15: 10 mL

## 2023-05-15 MED ORDER — FENTANYL CITRATE (PF) 100 MCG/2ML IJ SOLN
INTRAMUSCULAR | Status: AC
  Filled 2023-05-15: qty 2

## 2023-05-15 MED ORDER — CHLORHEXIDINE GLUCONATE 0.12 % MT SOLN
15.0000 mL | Freq: Once | OROMUCOSAL | Status: AC
Start: 2023-05-15 — End: 2023-05-15
  Administered 2023-05-15: 15 mL via OROMUCOSAL

## 2023-05-15 MED ORDER — ORAL CARE MOUTH RINSE: 15.0000 mL | Freq: Once | OROMUCOSAL | Status: AC

## 2023-05-15 MED ORDER — FENTANYL CITRATE (PF) 100 MCG/2ML IJ SOLN
INTRAMUSCULAR | Status: DC | PRN
  Administered 2023-05-15 (×2): 25 ug via INTRAVENOUS
  Administered 2023-05-15: 50 ug via INTRAVENOUS

## 2023-05-15 MED ORDER — OXYBUTYNIN CHLORIDE 5 MG PO TABS: 5.0000 mg | ORAL_TABLET | Freq: Three times a day (TID) | ORAL | 1 refills | Status: DC | PRN

## 2023-05-15 MED ORDER — STERILE WATER FOR IRRIGATION IR SOLN
Status: DC | PRN
  Administered 2023-05-15: 3000 mL

## 2023-05-15 MED ORDER — PROPOFOL 10 MG/ML IV BOLUS
INTRAVENOUS | Status: DC | PRN
  Administered 2023-05-15: 200 mg via INTRAVENOUS

## 2023-05-15 SURGICAL SUPPLY — 27 items
BAG URINE DRAIN 2000ML AR STRL (UROLOGICAL SUPPLIES) IMPLANT
BAG URO CATCHER STRL LF (MISCELLANEOUS) ×1 IMPLANT
BASKET ZERO TIP NITINOL 2.4FR (BASKET) IMPLANT
CABLE HIGH FREQUENCY MONO STRZ (ELECTRODE) IMPLANT
CATH URETL OPEN 5X70 (CATHETERS) ×1 IMPLANT
CLOTH BEACON ORANGE TIMEOUT ST (SAFETY) ×1 IMPLANT
DRAPE FOOT SWITCH (DRAPES) ×1 IMPLANT
ELECT REM PT RETURN 15FT ADLT (MISCELLANEOUS) ×1 IMPLANT
EXTRACTOR STONE NITINOL NGAGE (UROLOGICAL SUPPLIES) IMPLANT
FIBER LASER MOSES 200 DFL (Laser) IMPLANT
FIBER LASER MOSES 365 DFL (Laser) IMPLANT
GLOVE SURG LX STRL 7.5 STRW (GLOVE) ×1 IMPLANT
GOWN SRG XL LVL 4 BRTHBL STRL (GOWNS) ×1 IMPLANT
GOWN STRL SURGICAL XL XLNG (GOWN DISPOSABLE) ×1 IMPLANT
GUIDEWIRE STR DUAL SENSOR (WIRE) IMPLANT
GUIDEWIRE ZIPWRE .038 STRAIGHT (WIRE) ×1 IMPLANT
KIT TURNOVER KIT A (KITS) IMPLANT
LOOP CUT BIPOLAR 24F LRG (ELECTROSURGICAL) IMPLANT
MANIFOLD NEPTUNE II (INSTRUMENTS) ×1 IMPLANT
PACK CYSTO (CUSTOM PROCEDURE TRAY) ×1 IMPLANT
SHEATH NAVIGATOR HD 11/13X36 (SHEATH) IMPLANT
STENT URET 6FRX24 CONTOUR (STENTS) IMPLANT
STENT URET 6FRX26 CONTOUR (STENTS) IMPLANT
SYR TOOMEY IRRIG 70ML (MISCELLANEOUS)
SYRINGE TOOMEY IRRIG 70ML (MISCELLANEOUS) IMPLANT
TUBING CONNECTING 10 (TUBING) ×1 IMPLANT
TUBING UROLOGY SET (TUBING) ×1 IMPLANT

## 2023-05-15 NOTE — Op Note (Signed)
Operative Note  Preoperative diagnosis:  1.  Gross hematuria 2.  Right renal lesion vs abscess  Postoperative diagnosis: 1.  Gross hematuria 2.  Right renal lesion vs abscess  Procedure(s): 1.  Cystoscopy with diagnostic right ureteroscopy, right renal pelvis biopsy and right ureteral stent placement 2.  Right retrograde pyelogram with intraoperative interpretation fluoroscopic imaging  Surgeon: Rhoderick Moody, MD  Assistants:  None  Anesthesia:  General  Complications:  None  EBL: Less than 5 mL  Specimens: 1.  Right renal pelvis washings 2.  Right renal pelvis biopsy 3.  Bladder washings  Drains/Catheters: 1.  Right 6 French, 24 cm JJ stent without tether  Intraoperative findings:   Right retrograde pyelogram revealed no filling defects along the entire length of the right ureter.  The medial aspects of the renal pelvis and upper pole calyces were not opacified by contrast.  The remaining mid and lower pole calyces revealed no filling defects Edematous right renal pelvis mucosa with minimal clot burden.  No obvious papillary mucosal lesions were identified within the renal pelvis or any of the associated calyces.  Indication:  Evelyn Jordan is a 72 y.o. female with gross hematuria and a large right renal lesion VS abscess with concomitant retroperitoneal lymphadenopathy and  seen on recent cross-sectional imaging.  She has been consented for the above procedures, voices understanding and wishes to proceed.  Description of procedure:  After informed consent was obtained, the patient was brought to the operating room and general anesthesia was administered. The patient was then placed in the dorsolithotomy position and prepped and draped in the usual sterile fashion. A timeout was performed. A 23 French rigid cystoscope was then inserted into the urethral meatus and advanced into the bladder under direct vision. A complete bladder survey revealed no intravesical  pathology.  A 5 French ureteral catheter was then inserted into the right ureteral orifice and a retrograde pyelogram was obtained, with the findings listed above.  A Glidewire was then used to intubate the lumen of the ureteral catheter and was advanced up to the right renal pelvis, under fluoroscopic guidance.  The catheter was then removed, leaving the wire in place.  A flexible ureteroscope was then advanced over the wire and up to the right renal pelvis under direct vision as well as fluoroscopic guidance.  A complete inspection of the right renal pelvis revealed markedly edematous mucosa along the medial aspects of the renal pelvis as well as the upper pole calyces with minimal clot burden.  No obvious papillary mucosal lesions were seen.  Washings from the right renal pelvis were obtained and sent for cytology.  I used a 0 tip basket to obtain a tissue specimen from the edematous mucosa that was also sent for permanent section.  The flexible ureteroscope was then removed under direct vision, identifying no ureteral lesions or evidence of ureteral trauma, leaving the Glidewire in place within the right collecting system.  A 6 French, 24 cm JJ stent was then placed over the Glidewire and into good position within the right collecting system, confirming placement via fluoroscopy.  The patient's bladder was drained.  She tolerated the procedure well and was transferred to the postanesthesia in stable condition.  Plan: Discharge home.  Follow-up pathology results and MRI on 05/21/2023

## 2023-05-15 NOTE — H&P (Signed)
Office Visit Report     05/13/2023   --------------------------------------------------------------------------------   Evelyn Jordan  MRN: [id number]  DOB: [birth year], 72 year old Female  SSN:    PRIMARY CARE:  Evelyn L. Mardelle Matte, MD  PRIMARY CARE FAX:  [phone]  REFERRING:  Evelyn Mallard L. Mardelle Matte, MD  PROVIDER:  Rhoderick Jordan, M.D.  LOCATION:  [facility] [phone]     --------------------------------------------------------------------------------   CC/HPI: Right renal abscess vs necrotic mass   Ms. Evelyn Jordan is a 73 year old female who was seen in consultation by Dr. Arita Jordan on 05/09/2023 due to gross hematuria along with an abnormally enhancing right kidney with reactive lymphadenopathy. The patient has a significant past medical history of non-small cell lung cancer for which she completed chemotherapy in May 2024. She also recently completed a prolonged course of steroids. Review of her CT from 03/05/2023 revealed no parenchymal abnormalities in the partially viewed kidneys. Subsequent CT abdomen and pelvis with contrast on 05/01/2023 showed evolution and distortion of the parenchymal architecture along with profound lymphadenopathy with a pericaval lymph node with necrotic features. Additional CT scan on 05/09/2023 showed similar features. This process is concerning for either an evolving pyelonephritis/renal abscess versus malignancy. Her urine and blood cultures from the hospital were negative. She denies any interval episodes of fevers, but does experience nightly night sweats and chills. Currently, her pain is well-managed with ibuprofen 800 mg every 10-12 hours and she is no longer experiencing clot passage/gross hematuria.   05/13/2023: The patient presents back today after passing several sizable clots in the shower this morning. She is voiding spontaneously with a PVR of approximately 123 mL today.     ALLERGIES: Compazine    MEDICATIONS: Cipro 500 mg tablet 1  tablet PO BID  Synthroid 75 mcg tablet     GU PSH: No GU PSH      PSH Notes: knee surgery, ankle surgery, tonsillectomy, BLT   NON-GU PSH: No Non-GU PSH    GU PMH: Gross hematuria - 05/12/2023 Renal and perinephric abscess - 05/12/2023 Right renal neoplasm - 05/12/2023      PMH Notes: thyroid disorder   NON-GU PMH: No Non-GU PMH    FAMILY HISTORY: Hypertension - Runs in Family   SOCIAL HISTORY: Marital Status: Married Preferred Language: English; Race: White Current Smoking Status: Patient does not smoke anymore.   Tobacco Use Assessment Completed: Used Tobacco in last 30 days? Social Drinker.     REVIEW OF SYSTEMS:    GU Review Female:   Patient denies frequent urination, hard to postpone urination, burning /pain with urination, get up at night to urinate, leakage of urine, stream starts and stops, trouble starting your stream, have to strain to urinate, and being pregnant.  Gastrointestinal (Upper):   Patient denies nausea, vomiting, and indigestion/ heartburn.  Gastrointestinal (Lower):   Patient denies diarrhea and constipation.  Constitutional:   Patient denies fever, night sweats, weight loss, and fatigue.  Skin:   Patient denies skin rash/ lesion and itching.  Eyes:   Patient denies blurred vision and double vision.  Ears/ Nose/ Throat:   Patient denies sore throat and sinus problems.  Hematologic/Lymphatic:   Patient denies swollen glands and easy bruising.  Cardiovascular:   Patient denies leg swelling and chest pains.  Respiratory:   Patient denies cough and shortness of breath.  Endocrine:   Patient denies excessive thirst.  Musculoskeletal:   Patient denies back pain and joint pain.  Neurological:   Patient denies headaches and dizziness.  Psychologic:   Patient denies depression and anxiety.   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 I offered to place a catheter and personally hand irrigate her bladder this morning, but she declined. RTC precautions discussed.

## 2023-05-15 NOTE — Anesthesia Procedure Notes (Signed)
Procedure Name: LMA Insertion Date/Time: 05/15/2023 4:00 PM  Performed by: Vanessa Oldenburg, CRNAPre-anesthesia Checklist: Emergency Drugs available, Patient identified, Suction available and Patient being monitored Patient Re-evaluated:Patient Re-evaluated prior to induction Oxygen Delivery Method: Circle system utilized Preoxygenation: Pre-oxygenation with 100% oxygen Induction Type: IV induction Ventilation: Mask ventilation without difficulty LMA: LMA inserted LMA Size: 4.0 Number of attempts: 1 Placement Confirmation: positive ETCO2 and breath sounds checked- equal and bilateral Tube secured with: Tape Dental Injury: Teeth and Oropharynx as per pre-operative assessment

## 2023-05-15 NOTE — Transfer of Care (Signed)
Immediate Anesthesia Transfer of Care Note  Patient: Evelyn Jordan  Procedure(s) Performed: CYSTOSCOPY, RIGHT RETROGRADE AND RIGHT URETEROSCOPY CYSTOSCOPY WITH BIOPSY  Patient Location: PACU  Anesthesia Type:General  Level of Consciousness: drowsy  Airway & Oxygen Therapy: Patient Spontanous Breathing and Patient connected to face mask  Post-op Assessment: Report given to RN and Post -op Vital signs reviewed and stable  Post vital signs: Reviewed and stable  Last Vitals:  Vitals Value Taken Time  BP    Temp    Pulse    Resp    SpO2      Last Pain:  Vitals:   05/15/23 1247  TempSrc: Oral         Complications: No notable events documented.

## 2023-05-17 NOTE — Anesthesia Postprocedure Evaluation (Signed)
Anesthesia Post Note  Patient: HANG ORTEGA  Procedure(s) Performed: CYSTOSCOPY, RIGHT RETROGRADE, RIGHT URETEROSCOPY, RIGHT STENT PLACEMENT CYSTOSCOPY WITH RIGHT RENAL PELVIS BIOPSY     Patient location during evaluation: PACU Anesthesia Type: General Level of consciousness: awake and alert Pain management: pain level controlled Vital Signs Assessment: post-procedure vital signs reviewed and stable Respiratory status: spontaneous breathing, nonlabored ventilation, respiratory function stable and patient connected to nasal cannula oxygen Cardiovascular status: blood pressure returned to baseline and stable Postop Assessment: no apparent nausea or vomiting Anesthetic complications: no   No notable events documented.  Last Vitals:  Vitals:   05/15/23 1715 05/15/23 1730  BP: 132/82 (!) 142/85  Pulse: 95 87  Resp: 17 19  Temp:  (!) 36.2 C  SpO2: 97% 100%    Last Pain:  Vitals:   05/15/23 1730  TempSrc:   PainSc: 0-No pain                 Pope Brunty S

## 2023-05-18 ENCOUNTER — Encounter: Payer: Self-pay | Admitting: Cardiology

## 2023-05-18 ENCOUNTER — Encounter (HOSPITAL_COMMUNITY): Payer: Self-pay | Admitting: Urology

## 2023-05-19 ENCOUNTER — Encounter: Payer: Self-pay | Admitting: Family Medicine

## 2023-05-19 ENCOUNTER — Inpatient Hospital Stay: Payer: Medicare Other | Admitting: Family Medicine

## 2023-05-19 LAB — SURGICAL PATHOLOGY

## 2023-05-19 LAB — CYTOLOGY - NON PAP

## 2023-05-19 NOTE — Telephone Encounter (Signed)
I replied on result section

## 2023-05-19 NOTE — Progress Notes (Signed)
See mychart note Dear Ms. Evelyn Jordan, Yes, your cardiac CT score is elevated. We can discuss this at a follow up visit.  The most concerning issue for you right now is your lung cancer.   Please make a visit to discuss.  Sincerely, Dr. Mardelle Matte

## 2023-05-20 ENCOUNTER — Other Ambulatory Visit: Payer: Self-pay

## 2023-05-20 NOTE — Telephone Encounter (Signed)
Score of 880 is consistent with severe coronary artery calcification.  Please schedule an office visit to discuss workup-diagnostic testing and pharmacological therapy.  If he recently had your cholesterol checked by PCP this bring a copy for reference.Tessa Lerner, DO, [facility]  Triage: Based on her availability either schedule an appointment with myself or APP to discuss the workup for newly discovered CAC.  Kyliee Ortego [place], DO, [facility]

## 2023-05-20 NOTE — Progress Notes (Signed)
Patient called in stating she was going to be have a Nephrectomy on 12/24 to have her kidney removed. She wanted to call in to see it Dr. Arbutus Ped has any further recommendations to go along with the surgery. This CMA spoke with the doctor he stated he would follow up with the patient after she has the surgery at her January appointment. Informed patient of what the doctor said and she voiced full understanding.

## 2023-05-21 DIAGNOSIS — D49511 Neoplasm of unspecified behavior of right kidney: Secondary | ICD-10-CM | POA: Diagnosis not present

## 2023-05-22 ENCOUNTER — Other Ambulatory Visit: Payer: Self-pay | Admitting: Urology

## 2023-05-22 ENCOUNTER — Encounter (HOSPITAL_COMMUNITY): Payer: Self-pay | Admitting: Urology

## 2023-05-22 ENCOUNTER — Other Ambulatory Visit: Payer: Self-pay

## 2023-05-22 NOTE — Progress Notes (Signed)
Sent message, via epic in basket, requesting orders in epic from surgeon.  

## 2023-05-25 ENCOUNTER — Encounter (HOSPITAL_COMMUNITY): Payer: Self-pay

## 2023-05-25 ENCOUNTER — Inpatient Hospital Stay: Payer: Medicare Other | Attending: Internal Medicine

## 2023-05-25 ENCOUNTER — Ambulatory Visit (HOSPITAL_COMMUNITY)
Admission: RE | Admit: 2023-05-25 | Discharge: 2023-05-25 | Disposition: A | Discharge status: Home / Self Care | Payer: Medicare Other | Source: Ambulatory Visit | Attending: Internal Medicine | Admitting: Internal Medicine

## 2023-05-25 ENCOUNTER — Other Ambulatory Visit: Payer: Self-pay | Admitting: Urology

## 2023-05-25 DIAGNOSIS — Z923 Personal history of irradiation: Secondary | ICD-10-CM | POA: Insufficient documentation

## 2023-05-25 DIAGNOSIS — I700 Atherosclerosis of aorta: Secondary | ICD-10-CM | POA: Diagnosis not present

## 2023-05-25 DIAGNOSIS — C3412 Malignant neoplasm of upper lobe, left bronchus or lung: Secondary | ICD-10-CM | POA: Insufficient documentation

## 2023-05-25 DIAGNOSIS — N3289 Other specified disorders of bladder: Secondary | ICD-10-CM | POA: Diagnosis not present

## 2023-05-25 DIAGNOSIS — C3490 Malignant neoplasm of unspecified part of unspecified bronchus or lung: Secondary | ICD-10-CM | POA: Diagnosis not present

## 2023-05-25 DIAGNOSIS — E041 Nontoxic single thyroid nodule: Secondary | ICD-10-CM | POA: Insufficient documentation

## 2023-05-25 DIAGNOSIS — Z888 Allergy status to other drugs, medicaments and biological substances status: Secondary | ICD-10-CM | POA: Diagnosis not present

## 2023-05-25 DIAGNOSIS — Z452 Encounter for adjustment and management of vascular access device: Secondary | ICD-10-CM | POA: Insufficient documentation

## 2023-05-25 DIAGNOSIS — J90 Pleural effusion, not elsewhere classified: Secondary | ICD-10-CM | POA: Diagnosis not present

## 2023-05-25 DIAGNOSIS — C679 Malignant neoplasm of bladder, unspecified: Secondary | ICD-10-CM | POA: Diagnosis not present

## 2023-05-25 DIAGNOSIS — Z85118 Personal history of other malignant neoplasm of bronchus and lung: Secondary | ICD-10-CM | POA: Diagnosis not present

## 2023-05-25 DIAGNOSIS — Z8249 Family history of ischemic heart disease and other diseases of the circulatory system: Secondary | ICD-10-CM | POA: Diagnosis not present

## 2023-05-25 DIAGNOSIS — Z905 Acquired absence of kidney: Secondary | ICD-10-CM | POA: Insufficient documentation

## 2023-05-25 DIAGNOSIS — M810 Age-related osteoporosis without current pathological fracture: Secondary | ICD-10-CM | POA: Diagnosis present

## 2023-05-25 DIAGNOSIS — Z87891 Personal history of nicotine dependence: Secondary | ICD-10-CM | POA: Diagnosis not present

## 2023-05-25 DIAGNOSIS — C641 Malignant neoplasm of right kidney, except renal pelvis: Secondary | ICD-10-CM | POA: Diagnosis present

## 2023-05-25 DIAGNOSIS — J439 Emphysema, unspecified: Secondary | ICD-10-CM | POA: Diagnosis not present

## 2023-05-25 DIAGNOSIS — Z9221 Personal history of antineoplastic chemotherapy: Secondary | ICD-10-CM | POA: Diagnosis not present

## 2023-05-25 DIAGNOSIS — C651 Malignant neoplasm of right renal pelvis: Secondary | ICD-10-CM | POA: Diagnosis not present

## 2023-05-25 DIAGNOSIS — Z95828 Presence of other vascular implants and grafts: Secondary | ICD-10-CM

## 2023-05-25 DIAGNOSIS — C172 Malignant neoplasm of ileum: Secondary | ICD-10-CM | POA: Diagnosis not present

## 2023-05-25 LAB — CBC WITH DIFFERENTIAL (CANCER CENTER ONLY)
Abs Immature Granulocytes: 0.03 10*3/uL (ref 0.00–0.07)
Basophils Absolute: 0.1 10*3/uL (ref 0.0–0.1)
Basophils Relative: 1 %
Eosinophils Absolute: 0.2 10*3/uL (ref 0.0–0.5)
Eosinophils Relative: 3 %
HCT: 26.4 % — ABNORMAL LOW (ref 36.0–46.0)
Hemoglobin: 8.4 g/dL — ABNORMAL LOW (ref 12.0–15.0)
Immature Granulocytes: 0 %
Lymphocytes Relative: 4 %
Lymphs Abs: 0.3 10*3/uL — ABNORMAL LOW (ref 0.7–4.0)
MCH: 29.8 pg (ref 26.0–34.0)
MCHC: 31.8 g/dL (ref 30.0–36.0)
MCV: 93.6 fL (ref 80.0–100.0)
Monocytes Absolute: 0.9 10*3/uL (ref 0.1–1.0)
Monocytes Relative: 10 %
Neutro Abs: 6.9 10*3/uL (ref 1.7–7.7)
Neutrophils Relative %: 82 %
Platelet Count: 315 10*3/uL (ref 150–400)
RBC: 2.82 MIL/uL — ABNORMAL LOW (ref 3.87–5.11)
RDW: 14.3 % (ref 11.5–15.5)
WBC Count: 8.3 10*3/uL (ref 4.0–10.5)
nRBC: 0 % (ref 0.0–0.2)

## 2023-05-25 LAB — CMP (CANCER CENTER ONLY)
ALT: 10 U/L (ref 0–44)
AST: 13 U/L — ABNORMAL LOW (ref 15–41)
Albumin: 3.5 g/dL (ref 3.5–5.0)
Alkaline Phosphatase: 48 U/L (ref 38–126)
Anion gap: 5 (ref 5–15)
BUN: 13 mg/dL (ref 8–23)
CO2: 27 mmol/L (ref 22–32)
Calcium: 8.5 mg/dL — ABNORMAL LOW (ref 8.9–10.3)
Chloride: 105 mmol/L (ref 98–111)
Creatinine: 0.95 mg/dL (ref 0.44–1.00)
GFR, Estimated: >60 mL/min (ref >60)
Glucose, Bld: 107 mg/dL — ABNORMAL HIGH (ref 70–99)
Potassium: 3.7 mmol/L (ref 3.5–5.1)
Sodium: 137 mmol/L (ref 135–145)
Total Bilirubin: 0.3 mg/dL (ref <1.2)
Total Protein: 6.4 g/dL — ABNORMAL LOW (ref 6.5–8.1)

## 2023-05-25 MED ORDER — IOHEXOL 300 MG/ML  SOLN
75.0000 mL | Freq: Once | INTRAMUSCULAR | Status: AC | PRN
  Administered 2023-05-25: 75 mL via INTRAVENOUS

## 2023-05-25 MED ORDER — HEPARIN SOD (PORK) LOCK FLUSH 100 UNIT/ML IV SOLN
500.0000 [IU] | Freq: Once | INTRAVENOUS | Status: AC
  Administered 2023-05-25: 500 [IU] via INTRAVENOUS

## 2023-05-25 MED ORDER — SODIUM CHLORIDE 0.9% FLUSH
10.0000 mL | Freq: Once | INTRAVENOUS | Status: AC
  Administered 2023-05-25: 10 mL

## 2023-05-25 MED ORDER — HEPARIN SOD (PORK) LOCK FLUSH 100 UNIT/ML IV SOLN
INTRAVENOUS | Status: AC
  Filled 2023-05-25: qty 5

## 2023-05-25 NOTE — Anesthesia Preprocedure Evaluation (Addendum)
Anesthesia Evaluation  Patient identified by MRN, date of birth, ID band Patient awake    Reviewed: Allergy & Precautions, H&P , NPO status , Patient's Chart, lab work & pertinent test results  Airway Mallampati: III  TM Distance: >3 FB Neck ROM: Full    Dental no notable dental hx. (+) Teeth Intact, Dental Advisory Given   Pulmonary neg pulmonary ROS, former smoker   Pulmonary exam normal breath sounds clear to auscultation       Cardiovascular Exercise Tolerance: Good negative cardio ROS  Rhythm:Regular Rate:Normal     Neuro/Psych    Depression    negative neurological ROS     GI/Hepatic negative GI ROS, Neg liver ROS,,,  Endo/Other  Hypothyroidism    Renal/GU Renal disease  negative genitourinary   Musculoskeletal   Abdominal   Peds  Hematology negative hematology ROS (+)   Anesthesia Other Findings   Reproductive/Obstetrics negative OB ROS                             Anesthesia Physical Anesthesia Plan  ASA: 2  Anesthesia Plan: General   Post-op Pain Management: Tylenol PO (pre-op)*   Induction: Intravenous  PONV Risk Score and Plan: 4 or greater and Ondansetron, Dexamethasone and Treatment may vary due to age or medical condition  Airway Management Planned: Oral ETT  Additional Equipment:   Intra-op Plan:   Post-operative Plan: Extubation in OR  Informed Consent: I have reviewed the patients History and Physical, chart, labs and discussed the procedure including the risks, benefits and alternatives for the proposed anesthesia with the patient or authorized representative who has indicated his/her understanding and acceptance.     Dental advisory given  Plan Discussed with: CRNA  Anesthesia Plan Comments:        Anesthesia Quick Evaluation

## 2023-05-25 NOTE — H&P (Signed)
Office Visit Report     05/21/2023   --------------------------------------------------------------------------------   Willette Pa. Bergsma  MRN: [id number]  DOB: 1951/03/25, 72 year old Female  SSN:    PRIMARY CARE:  Camille L. Mardelle Matte, MD  PRIMARY CARE FAX:  [phone]  REFERRING:  Si Raider. Liliane Shi, MD  PROVIDER:  Rhoderick Moody, M.D.  LOCATION:  [facility] [phone]     --------------------------------------------------------------------------------   CC/HPI: Right renal mass with retroperitoneal lymphadenopathy   Ms. Bulloch is a 72 year old female who was seen in consultation by Dr. Arita Miss on 05/09/2023 due to gross hematuria along with an abnormally enhancing right kidney with reactive lymphadenopathy. The patient has a significant past medical history of non-small cell lung cancer for which she completed chemotherapy in May 2024. She also recently completed a prolonged course of steroids. Review of her CT from 03/05/2023 revealed no parenchymal abnormalities in the partially viewed kidneys. Subsequent CT abdomen and pelvis with contrast on 05/01/2023 showed evolution and distortion of the parenchymal architecture along with profound lymphadenopathy with a pericaval lymph node with necrotic features. Additional CT scan on 05/09/2023 showed similar features. This process is concerning for either an evolving pyelonephritis/renal abscess versus malignancy. Her urine and blood cultures from the hospital were negative. She denies any interval episodes of fevers, but does experience nightly night sweats and chills. Currently, her pain is well-managed with ibuprofen 800 mg every 10-12 hours and she is no longer experiencing clot passage/gross hematuria.   05/13/2023: The patient presents back today after passing several sizable clots in the shower this morning. She is voiding spontaneously with a PVR of approximately 123 mL today.   05/21/2023: The patient presents back  today due to worsening right flank pain and bladder irritation from her ureteral stent. 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 We are planning on open radical nephroureterectomy on 06/02/2023.     ALLERGIES: Compazine    MEDICATIONS: Cipro 500 mg tablet 1 tablet PO BID  Synthroid 75 mcg tablet     GU PSH: No GU PSH      PSH Notes: knee surgery, ankle surgery, tonsillectomy, BLT   NON-GU PSH: No Non-GU PSH    GU PMH: Gross hematuria - 05/13/2023, - 05/12/2023 Renal and perinephric abscess - 05/13/2023, - 05/12/2023 Right renal neoplasm - 05/12/2023      PMH Notes: thyroid disorder   NON-GU PMH: No Non-GU PMH    FAMILY HISTORY: Hypertension - Runs in Family   SOCIAL HISTORY: Marital Status: Married Preferred Language: English; Race: White Current Smoking Status: Patient does not smoke anymore.   Tobacco Use Assessment Completed: Used Tobacco in last 30 days? Social Drinker.     REVIEW OF SYSTEMS:    GU Review Female:   Patient denies frequent urination, hard to postpone urination, burning /pain with urination, get up at night to urinate, leakage of urine, stream starts and stops, trouble starting your stream, have to strain to urinate, and being pregnant.  Gastrointestinal (Upper):   Patient denies nausea, vomiting, and indigestion/ heartburn.  Gastrointestinal (Lower):   Patient denies diarrhea and constipation.  Constitutional:   Patient denies fever, night sweats, weight loss, and fatigue.  Skin:   Patient denies skin rash/ lesion and itching.  Eyes:   Patient denies blurred vision and double vision.  Ears/ Nose/ Throat:   Patient denies sore throat and sinus problems.  Hematologic/Lymphatic:   Patient denies swollen glands and easy bruising.  Cardiovascular:  Patient denies leg swelling and chest pains.  Respiratory:   Patient denies cough and shortness of breath.  Endocrine:   Patient denies excessive  thirst.  Musculoskeletal:   Patient denies back pain and joint pain.  Neurological:   Patient denies headaches and dizziness.  Psychologic:   Patient denies depression and anxiety.   VITAL SIGNS: None   GU PHYSICAL EXAMINATION:    Urethra: No tenderness, no mass, no scarring. No hypermobility. No leakage.  Bladder: Normal to palpation, no tenderness, no mass, normal size.  Vagina: No atrophy, no stenosis. No rectocele. No cystocele. No enterocele.   MULTI-SYSTEM PHYSICAL EXAMINATION:    Constitutional: Well-nourished. No physical deformities. Normally developed. Good grooming.  Neurologic / Psychiatric: Oriented to time, oriented to place, oriented to person. No depression, no anxiety, no agitation.     Complexity of Data:  Records Review:   Pathology Reports, Previous Hospital Records, Previous Patient Records  Notes:                     SURGICAL PATHOLOGY  CASE: [id number]  PATIENT: Alda Ponder  Surgical Pathology Report      Clinical History: Right renal neoplasm, gross hematuria (crm)      FINAL MICROSCOPIC DIAGNOSIS:   A. RENAL PELVIS, RIGHT, BIOPSY:  Small fragment of high grade carcinoma with squamous features    GROSS DESCRIPTION:   Received in formalin is a single fragment of white-tan soft tissue  measuring 0.1 cm in greatest dimension. The specimen is entirely  submitted in 1 block.  (KW, 05/18/2023)     Final Diagnosis performed by Marlena Clipper, MD. Electronically signed  05/19/2023  Technical and / or Professional components performed at [facility], [address] [place], [place] [address].  Immunohistochemistry Technical component (if applicable) was performed  at [facility] [address],  [place], [place] [address]. IMMUNOHISTOCHEMISTRY DISCLAIMER (if applicable):  Some of these immunohistochemical stains may have been developed and the  performance characteristics determine by [facility]. Some  may not have been cleared or approved by the U.S. Food and Drug  Administration. The FDA has determined that such clearance or approval  is not necessary. This test is used for clinical purposes. It should not  be regarded as investigational or for research. This laboratory is  certified under the Clinical Laboratory Improvement Amendments of 1988  (CLIA-88) as qualified to perform high complexity clinical laboratory  testing. The controls stained appropriately. IHC stains are performed  on formalin fixed, paraffin embedded tissue using a 3,3"diaminobenzidine  (DAB) chromogen and Leica Bond Autostainer System. The staining  intensity of the nucleus is score manually and is reported as the  percentage of tumor cell nuclei demonstrating specific nuclear staining.  The specimens are fixed in 10% Neutral Formalin for at least 6 hours and  up to 72hrs. These tests are validated on decalcified tissue. Results  should be interpreted with caution given the possibility of false  negative results on decalcified specimens. Antibody Clones are as  follows ER-clone 51F, PR-clone 16, Ki67- clone MM1. Some of these  immunohistochemical stains may have been developed and the performance  characteristics determined by [facility].     PROCEDURES:         Flexible Cystoscopy Right Stent Removal - 51884  Risks, benefits, and some of the potential complications of the procedure were discussed at length with the patient including infection, bleeding, voiding discomfort, urinary retention, fever,  chills, sepsis, and others. All questions were answered. Informed consent was obtained. Antibiotic prophylaxis was given. Sterile technique and intraurethral analgesia were used.  Meatus:  Normal size. Normal location. Normal condition.  Urethra:  No hypermobility. No leakage.  Ureteral Orifices:  Normal location. Normal size. Normal shape. Effluxed clear urine.  Bladder:  No trabeculation.  No tumors. Normal mucosa. No stones.  The right ureteral stent was carefully removed with a grasping instrument.    The lower urinary tract was carefully examined. The procedure was well-tolerated and without complications. Antibiotic instructions were given. Instructions were given to call the office immediately for bloody urine, difficulty urinating, urinary retention, painful or frequent urination, fever, chills, nausea, vomiting or other illness. The patient stated that she understood these instructions and would comply with them.         Urinalysis w/Scope Dipstick Dipstick Cont'd Micro  Color: Yellow Bilirubin: Neg mg/dL WBC/hpf: NS (Not Seen)  Appearance: Clear Ketones: Neg mg/dL RBC/hpf: 0 - 2/hpf  Specific Gravity: 1.025 Blood: Neg ery/uL Bacteria: NS (Not Seen)  pH: 7.0 Protein: 1+ mg/dL Cystals: NS (Not Seen)  Glucose: Neg mg/dL Urobilinogen: 0.2 mg/dL Casts: Mixed casts    Nitrites: Neg Trichomonas: Not Present    Leukocyte Esterase: Neg leu/uL Mucous: Present      Epithelial Cells: 0 - 5/hpf      Yeast: NS (Not Seen)      Sperm: Not Present    Notes: hyaline and cellular casts seen    ASSESSMENT:      ICD-10 Details  1 GU:   Right renal neoplasm - D49.511 Chronic, Stable   PLAN:           Schedule Return Visit/Planned Activity: ASAP - Schedule Surgery             Note: Cancel appointment on 05/25/23          Document Letter(s):  Created for Patient: Clinical Summary   Created for [place] L. Mardelle Matte, MD   -Pathology and imaging results discussed with the patient, which are consistent with an upper tract urothelial cell carcinoma.  The risks, benefits and alternatives of cystoscopy with TUR of the RIGHT ureteral orifice and robot-assisted laparoscopic RIGHT nephroureterectomy were discussed in detail including but not limited to:  negative pathology, open conversion, infection of the skin/abdominal cavity, VTE, MI/CVA, lymphatic leak, injury to adjacent solid/hollow  viscus organs, bleeding requiring a blood transfusion, catastrophic bleeding, bladder perforation hernia formation and other imponderables.  The patient voices understanding and wishes to proceed.

## 2023-05-26 ENCOUNTER — Inpatient Hospital Stay (HOSPITAL_COMMUNITY): Payer: Medicare Other | Admitting: Anesthesiology

## 2023-05-26 ENCOUNTER — Other Ambulatory Visit: Payer: Self-pay

## 2023-05-26 ENCOUNTER — Encounter (HOSPITAL_COMMUNITY)
Admission: RE | Disposition: A | Discharge status: Home / Self Care | Payer: Self-pay | Source: Home / Self Care | Attending: Urology

## 2023-05-26 ENCOUNTER — Inpatient Hospital Stay (HOSPITAL_COMMUNITY)
Admission: RE | Admit: 2023-05-26 | Discharge: 2023-05-27 | DRG: 658 | Disposition: A | Discharge status: Home / Self Care | Payer: Medicare Other | Attending: Urology | Admitting: Urology

## 2023-05-26 ENCOUNTER — Inpatient Hospital Stay (HOSPITAL_COMMUNITY): Payer: Self-pay | Admitting: Anesthesiology

## 2023-05-26 ENCOUNTER — Encounter (HOSPITAL_COMMUNITY): Payer: Self-pay | Admitting: Urology

## 2023-05-26 ENCOUNTER — Inpatient Hospital Stay (HOSPITAL_COMMUNITY): Payer: Medicare Other

## 2023-05-26 DIAGNOSIS — C641 Malignant neoplasm of right kidney, except renal pelvis: Secondary | ICD-10-CM | POA: Diagnosis present

## 2023-05-26 DIAGNOSIS — Z8249 Family history of ischemic heart disease and other diseases of the circulatory system: Secondary | ICD-10-CM

## 2023-05-26 DIAGNOSIS — Z888 Allergy status to other drugs, medicaments and biological substances status: Secondary | ICD-10-CM | POA: Diagnosis not present

## 2023-05-26 DIAGNOSIS — N3289 Other specified disorders of bladder: Secondary | ICD-10-CM

## 2023-05-26 DIAGNOSIS — Z9221 Personal history of antineoplastic chemotherapy: Secondary | ICD-10-CM

## 2023-05-26 DIAGNOSIS — Z85118 Personal history of other malignant neoplasm of bronchus and lung: Secondary | ICD-10-CM | POA: Diagnosis not present

## 2023-05-26 DIAGNOSIS — M810 Age-related osteoporosis without current pathological fracture: Secondary | ICD-10-CM | POA: Diagnosis present

## 2023-05-26 DIAGNOSIS — N2889 Other specified disorders of kidney and ureter: Principal | ICD-10-CM | POA: Diagnosis present

## 2023-05-26 DIAGNOSIS — Z87891 Personal history of nicotine dependence: Secondary | ICD-10-CM

## 2023-05-26 DIAGNOSIS — C172 Malignant neoplasm of ileum: Secondary | ICD-10-CM | POA: Diagnosis not present

## 2023-05-26 DIAGNOSIS — C651 Malignant neoplasm of right renal pelvis: Secondary | ICD-10-CM | POA: Diagnosis not present

## 2023-05-26 DIAGNOSIS — C679 Malignant neoplasm of bladder, unspecified: Secondary | ICD-10-CM | POA: Diagnosis not present

## 2023-05-26 HISTORY — PX: ROBOT ASSITED LAPAROSCOPIC NEPHROURETERECTOMY: SHX6077

## 2023-05-26 HISTORY — PX: CYSTOSCOPY WITH STENT PLACEMENT: SHX5790

## 2023-05-26 LAB — CBC
HCT: 32.2 % — ABNORMAL LOW (ref 36.0–46.0)
Hemoglobin: 10.4 g/dL — ABNORMAL LOW (ref 12.0–15.0)
MCH: 30.8 pg (ref 26.0–34.0)
MCHC: 32.3 g/dL (ref 30.0–36.0)
MCV: 95.3 fL (ref 80.0–100.0)
Platelets: 291 10*3/uL (ref 150–400)
RBC: 3.38 MIL/uL — ABNORMAL LOW (ref 3.87–5.11)
RDW: 15.4 % (ref 11.5–15.5)
WBC: 10.4 10*3/uL (ref 4.0–10.5)
nRBC: 0 % (ref 0.0–0.2)

## 2023-05-26 LAB — BASIC METABOLIC PANEL
Anion gap: 9 (ref 5–15)
BUN: 13 mg/dL (ref 8–23)
CO2: 23 mmol/L (ref 22–32)
Calcium: 7.8 mg/dL — ABNORMAL LOW (ref 8.9–10.3)
Chloride: 102 mmol/L (ref 98–111)
Creatinine, Ser: 1.1 mg/dL — ABNORMAL HIGH (ref 0.44–1.00)
GFR, Estimated: 54 mL/min — ABNORMAL LOW (ref >60)
Glucose, Bld: 192 mg/dL — ABNORMAL HIGH (ref 70–99)
Potassium: 4.5 mmol/L (ref 3.5–5.1)
Sodium: 134 mmol/L — ABNORMAL LOW (ref 135–145)

## 2023-05-26 LAB — TSH: TSH: 23.907 u[IU]/mL — ABNORMAL HIGH (ref 0.350–4.500)

## 2023-05-26 LAB — ABO/RH: ABO/RH(D): A POS

## 2023-05-26 LAB — T4: T4, Total: 7.8 ug/dL (ref 4.5–12.0)

## 2023-05-26 SURGERY — NEPHROURETERECTOMY, ROBOT-ASSISTED, LAPAROSCOPIC
Anesthesia: General | Laterality: Right

## 2023-05-26 MED ORDER — FENTANYL CITRATE (PF) 100 MCG/2ML IJ SOLN
INTRAMUSCULAR | Status: DC | PRN
  Administered 2023-05-26 (×5): 50 ug via INTRAVENOUS

## 2023-05-26 MED ORDER — PROPOFOL 10 MG/ML IV BOLUS
INTRAVENOUS | Status: AC
  Filled 2023-05-26: qty 20

## 2023-05-26 MED ORDER — ONDANSETRON HCL 4 MG/2ML IJ SOLN
INTRAMUSCULAR | Status: DC | PRN
  Administered 2023-05-26: 4 mg via INTRAVENOUS

## 2023-05-26 MED ORDER — PHENYLEPHRINE 80 MCG/ML (10ML) SYRINGE FOR IV PUSH (FOR BLOOD PRESSURE SUPPORT)
PREFILLED_SYRINGE | INTRAVENOUS | Status: AC
  Filled 2023-05-26: qty 10

## 2023-05-26 MED ORDER — ROCURONIUM BROMIDE 10 MG/ML (PF) SYRINGE
PREFILLED_SYRINGE | INTRAVENOUS | Status: AC
  Filled 2023-05-26: qty 10

## 2023-05-26 MED ORDER — SODIUM CHLORIDE 0.45 % IV SOLN: INTRAVENOUS | Status: DC

## 2023-05-26 MED ORDER — SUGAMMADEX SODIUM 200 MG/2ML IV SOLN
INTRAVENOUS | Status: DC | PRN
  Administered 2023-05-26: 200 mg via INTRAVENOUS

## 2023-05-26 MED ORDER — FENTANYL CITRATE (PF) 250 MCG/5ML IJ SOLN
INTRAMUSCULAR | Status: AC
  Filled 2023-05-26: qty 5

## 2023-05-26 MED ORDER — HYDROMORPHONE HCL 1 MG/ML IJ SOLN
0.5000 mg | INTRAMUSCULAR | Status: DC | PRN
  Administered 2023-05-26 – 2023-05-27 (×2): 1 mg via INTRAVENOUS
  Filled 2023-05-26 (×2): qty 1

## 2023-05-26 MED ORDER — SODIUM CHLORIDE 0.9 % IR SOLN
Status: DC | PRN
  Administered 2023-05-26: 6000 mL

## 2023-05-26 MED ORDER — PHENYLEPHRINE HCL (PRESSORS) 10 MG/ML IV SOLN
INTRAVENOUS | Status: DC | PRN
  Administered 2023-05-26 (×4): 80 ug via INTRAVENOUS

## 2023-05-26 MED ORDER — DEXAMETHASONE SODIUM PHOSPHATE 10 MG/ML IJ SOLN
INTRAMUSCULAR | Status: AC
  Filled 2023-05-26: qty 1

## 2023-05-26 MED ORDER — HYDROMORPHONE HCL 1 MG/ML IJ SOLN
0.2500 mg | INTRAMUSCULAR | Status: DC | PRN
  Administered 2023-05-26 (×3): 0.5 mg via INTRAVENOUS

## 2023-05-26 MED ORDER — ONDANSETRON HCL 4 MG/2ML IJ SOLN
4.0000 mg | INTRAMUSCULAR | Status: DC | PRN
  Administered 2023-05-26: 4 mg via INTRAVENOUS
  Filled 2023-05-26: qty 2

## 2023-05-26 MED ORDER — CHLORHEXIDINE GLUCONATE 0.12 % MT SOLN
15.0000 mL | Freq: Once | OROMUCOSAL | Status: AC
  Administered 2023-05-26: 15 mL via OROMUCOSAL

## 2023-05-26 MED ORDER — BUPIVACAINE LIPOSOME 1.3 % IJ SUSP
INTRAMUSCULAR | Status: DC | PRN
  Administered 2023-05-26: 20 mL

## 2023-05-26 MED ORDER — MIDAZOLAM HCL 5 MG/5ML IJ SOLN
INTRAMUSCULAR | Status: DC | PRN
  Administered 2023-05-26: 1 mg via INTRAVENOUS

## 2023-05-26 MED ORDER — LIDOCAINE HCL (CARDIAC) PF 100 MG/5ML IV SOSY
PREFILLED_SYRINGE | INTRAVENOUS | Status: DC | PRN
  Administered 2023-05-26: 60 mg via INTRAVENOUS

## 2023-05-26 MED ORDER — ALBUTEROL SULFATE (2.5 MG/3ML) 0.083% IN NEBU: 3.0000 mL | INHALATION_SOLUTION | Freq: Four times a day (QID) | RESPIRATORY_TRACT | Status: DC | PRN

## 2023-05-26 MED ORDER — DIPHENHYDRAMINE HCL 12.5 MG/5ML PO ELIX: 12.5000 mg | ORAL_SOLUTION | Freq: Four times a day (QID) | ORAL | Status: DC | PRN

## 2023-05-26 MED ORDER — HYDROMORPHONE HCL 1 MG/ML IJ SOLN
INTRAMUSCULAR | Status: DC | PRN
  Administered 2023-05-26: .5 mg via INTRAVENOUS
  Administered 2023-05-26: 1 mg via INTRAVENOUS
  Administered 2023-05-26: .5 mg via INTRAVENOUS

## 2023-05-26 MED ORDER — HYOSCYAMINE SULFATE 0.125 MG SL SUBL: 0.1250 mg | SUBLINGUAL_TABLET | SUBLINGUAL | Status: DC | PRN

## 2023-05-26 MED ORDER — HYDROMORPHONE HCL 2 MG/ML IJ SOLN
INTRAMUSCULAR | Status: AC
  Filled 2023-05-26: qty 1

## 2023-05-26 MED ORDER — STERILE WATER FOR IRRIGATION IR SOLN
Status: DC | PRN
  Administered 2023-05-26: 1000 mL

## 2023-05-26 MED ORDER — HYDROMORPHONE HCL 1 MG/ML IJ SOLN
INTRAMUSCULAR | Status: AC
  Filled 2023-05-26: qty 2

## 2023-05-26 MED ORDER — GLYCOPYRROLATE 0.2 MG/ML IJ SOLN: INTRAMUSCULAR | Status: DC | PRN

## 2023-05-26 MED ORDER — DIPHENHYDRAMINE HCL 50 MG/ML IJ SOLN: 12.5000 mg | Freq: Four times a day (QID) | INTRAMUSCULAR | Status: DC | PRN

## 2023-05-26 MED ORDER — SODIUM CHLORIDE (PF) 0.9 % IJ SOLN
INTRAMUSCULAR | Status: DC | PRN
  Administered 2023-05-26: 10 mL

## 2023-05-26 MED ORDER — PROPOFOL 10 MG/ML IV BOLUS
INTRAVENOUS | Status: DC | PRN
  Administered 2023-05-26: 30 mg via INTRAVENOUS
  Administered 2023-05-26: 100 mg via INTRAVENOUS

## 2023-05-26 MED ORDER — ORAL CARE MOUTH RINSE: 15.0000 mL | Freq: Once | OROMUCOSAL | Status: AC

## 2023-05-26 MED ORDER — ROCURONIUM BROMIDE 100 MG/10ML IV SOLN
INTRAVENOUS | Status: DC | PRN
  Administered 2023-05-26: 20 mg via INTRAVENOUS
  Administered 2023-05-26: 60 mg via INTRAVENOUS
  Administered 2023-05-26 (×2): 20 mg via INTRAVENOUS

## 2023-05-26 MED ORDER — LEVOTHYROXINE SODIUM 75 MCG PO TABS
75.0000 ug | ORAL_TABLET | Freq: Every day | ORAL | Status: DC
  Administered 2023-05-27: 75 ug via ORAL
  Filled 2023-05-26: qty 1

## 2023-05-26 MED ORDER — MIDAZOLAM HCL 2 MG/2ML IJ SOLN
INTRAMUSCULAR | Status: AC
Start: 2023-05-26 — End: ?
  Filled 2023-05-26: qty 2

## 2023-05-26 MED ORDER — ONDANSETRON HCL 4 MG/2ML IJ SOLN
INTRAMUSCULAR | Status: AC
  Filled 2023-05-26: qty 2

## 2023-05-26 MED ORDER — SODIUM CHLORIDE (PF) 0.9 % IJ SOLN
INTRAMUSCULAR | Status: AC
  Filled 2023-05-26: qty 20

## 2023-05-26 MED ORDER — HYDROCODONE-ACETAMINOPHEN 5-325 MG PO TABS: 1.0000 | ORAL_TABLET | ORAL | 0 refills | Status: DC | PRN

## 2023-05-26 MED ORDER — DOCUSATE SODIUM 100 MG PO CAPS
100.0000 mg | ORAL_CAPSULE | Freq: Two times a day (BID) | ORAL | Status: DC
  Administered 2023-05-26 – 2023-05-27 (×2): 100 mg via ORAL
  Filled 2023-05-26 (×2): qty 1

## 2023-05-26 MED ORDER — OXYCODONE HCL 5 MG PO TABS
5.0000 mg | ORAL_TABLET | ORAL | Status: DC | PRN
Start: 2023-05-26 — End: 2023-05-27

## 2023-05-26 MED ORDER — BUPIVACAINE LIPOSOME 1.3 % IJ SUSP
INTRAMUSCULAR | Status: AC
  Filled 2023-05-26: qty 20

## 2023-05-26 MED ORDER — LIDOCAINE HCL (PF) 2 % IJ SOLN
INTRAMUSCULAR | Status: AC
  Filled 2023-05-26: qty 5

## 2023-05-26 MED ORDER — ACETAMINOPHEN 500 MG PO TABS
1000.0000 mg | ORAL_TABLET | Freq: Once | ORAL | Status: AC
  Administered 2023-05-26: 1000 mg via ORAL
  Filled 2023-05-26: qty 2

## 2023-05-26 MED ORDER — DEXAMETHASONE SODIUM PHOSPHATE 10 MG/ML IJ SOLN
INTRAMUSCULAR | Status: DC | PRN
  Administered 2023-05-26: 5 mg via INTRAVENOUS

## 2023-05-26 MED ORDER — CIPROFLOXACIN IN D5W 400 MG/200ML IV SOLN
400.0000 mg | INTRAVENOUS | Status: AC
  Administered 2023-05-26: 400 mg via INTRAVENOUS
  Filled 2023-05-26: qty 200

## 2023-05-26 MED ORDER — ACETAMINOPHEN 10 MG/ML IV SOLN
1000.0000 mg | Freq: Four times a day (QID) | INTRAVENOUS | Status: AC
  Administered 2023-05-26 – 2023-05-27 (×4): 1000 mg via INTRAVENOUS
  Filled 2023-05-26 (×4): qty 100

## 2023-05-26 MED ORDER — LACTATED RINGERS IV SOLN: INTRAVENOUS | Status: DC

## 2023-05-26 SURGICAL SUPPLY — 85 items
BAG COUNTER SPONGE SURGICOUNT (BAG) IMPLANT
BAG LAPAROSCOPIC 12 15 PORT 16 (BASKET) ×1 IMPLANT
BAG RETRIEVAL 12/15 (BASKET) ×1
BAG URINE DRAIN 2000ML AR STRL (UROLOGICAL SUPPLIES) IMPLANT
BAG URO CATCHER STRL LF (MISCELLANEOUS) ×1 IMPLANT
CATH FOLEY 3WAY 5CC 18FR (CATHETERS) IMPLANT
CATH URETL OPEN 5X70 (CATHETERS) IMPLANT
CAUTERY HOOK MNPLR 1.6 DVNC XI (INSTRUMENTS) ×1 IMPLANT
CHLORAPREP W/TINT 26 (MISCELLANEOUS) ×1 IMPLANT
CLIP LIGATING HEM O LOK PURPLE (MISCELLANEOUS) ×1 IMPLANT
CLIP LIGATING HEMO LOK XL GOLD (MISCELLANEOUS) ×1 IMPLANT
CLIP LIGATING HEMO O LOK GREEN (MISCELLANEOUS) IMPLANT
COVER SURGICAL LIGHT HANDLE (MISCELLANEOUS) ×1 IMPLANT
COVER TIP SHEARS 8 DVNC (MISCELLANEOUS) ×1 IMPLANT
CUTTER ECHEON FLEX ENDO 45 340 (ENDOMECHANICALS) IMPLANT
DERMABOND ADVANCED .7 DNX12 (GAUZE/BANDAGES/DRESSINGS) ×1 IMPLANT
DERMABOND ADVANCED .7 DNX6 (GAUZE/BANDAGES/DRESSINGS) IMPLANT
DRAIN CHANNEL 15F RND FF 3/16 (WOUND CARE) IMPLANT
DRAPE ARM DVNC X/XI (DISPOSABLE) ×4 IMPLANT
DRAPE COLUMN DVNC XI (DISPOSABLE) ×1 IMPLANT
DRAPE FOOT SWITCH (DRAPES) ×1 IMPLANT
DRAPE INCISE IOBAN 66X45 STRL (DRAPES) ×1 IMPLANT
DRAPE SHEET LG 3/4 BI-LAMINATE (DRAPES) ×1 IMPLANT
DRIVER NDL LRG 8 DVNC XI (INSTRUMENTS) ×2 IMPLANT
DRIVER NDLE LRG 8 DVNC XI (INSTRUMENTS) ×2 IMPLANT
DRSG TEGADERM 4X4.75 (GAUZE/BANDAGES/DRESSINGS) IMPLANT
ELECT HOOK LOOP BIPOLAR (NEEDLE) IMPLANT
ELECT PENCIL ROCKER SW 15FT (MISCELLANEOUS) ×1 IMPLANT
ELECT REM PT RETURN 15FT ADLT (MISCELLANEOUS) ×1 IMPLANT
EVACUATOR SILICONE 100CC (DRAIN) IMPLANT
FORCEPS PROGRASP DVNC XI (FORCEP) ×2 IMPLANT
GAUZE 4X4 16PLY ~~LOC~~+RFID DBL (SPONGE) IMPLANT
GAUZE SPONGE 2X2 8PLY STRL LF (GAUZE/BANDAGES/DRESSINGS) IMPLANT
GLOVE BIO SURGEON STRL SZ 6.5 (GLOVE) ×1 IMPLANT
GLOVE BIOGEL PI IND STRL 8 (GLOVE) ×1 IMPLANT
GLOVE SURG LX STRL 7.5 STRW (GLOVE) ×2 IMPLANT
GOWN STRL REUS W/ TWL XL LVL3 (GOWN DISPOSABLE) ×2 IMPLANT
GOWN STRL SURGICAL XL XLNG (GOWN DISPOSABLE) ×1 IMPLANT
GUIDEWIRE ZIPWRE .038 STRAIGHT (WIRE) IMPLANT
HOLDER FOLEY CATH W/STRAP (MISCELLANEOUS) ×1 IMPLANT
IRRIG SUCT STRYKERFLOW 2 WTIP (MISCELLANEOUS) ×1
IRRIGATION SUCT STRKRFLW 2 WTP (MISCELLANEOUS) ×1 IMPLANT
KIT BASIN OR (CUSTOM PROCEDURE TRAY) ×1 IMPLANT
KIT TURNOVER KIT A (KITS) IMPLANT
LOOP CUT BIPOLAR 24F LRG (ELECTROSURGICAL) IMPLANT
MANIFOLD NEPTUNE II (INSTRUMENTS) ×1 IMPLANT
MARKER SKIN DUAL TIP RULER LAB (MISCELLANEOUS) ×1 IMPLANT
NDL INSUFFLATION 14GA 120MM (NEEDLE) ×1 IMPLANT
NEEDLE INSUFFLATION 14GA 120MM (NEEDLE) ×1 IMPLANT
PACK CYSTO (CUSTOM PROCEDURE TRAY) ×1 IMPLANT
PLUG CATH AND CAP STRL 200 (CATHETERS) IMPLANT
PROTECTOR NERVE ULNAR (MISCELLANEOUS) ×2 IMPLANT
RELOAD STAPLE 45 2.5 WHT DVNC (STAPLE) IMPLANT
RELOAD STAPLE 45 2.6 WHT THIN (STAPLE) IMPLANT
RELOAD STAPLER 2.5X45 WHT DVNC (STAPLE) ×1 IMPLANT
SCISSORS LAP 5X45 EPIX DISP (ENDOMECHANICALS) ×1 IMPLANT
SCISSORS MNPLR CVD DVNC XI (INSTRUMENTS) ×1 IMPLANT
SEAL UNIV 5-12 XI (MISCELLANEOUS) ×4 IMPLANT
SET TUBE SMOKE EVAC HIGH FLOW (TUBING) ×1 IMPLANT
SOL ELECTROSURG ANTI STICK (MISCELLANEOUS) ×1
SOLUTION ELECTROSURG ANTI STCK (MISCELLANEOUS) ×1 IMPLANT
SPIKE FLUID TRANSFER (MISCELLANEOUS) ×1 IMPLANT
STAPLE RELOAD 45 WHT (STAPLE) IMPLANT
STAPLER 45 SUREFORM DVNC (STAPLE) IMPLANT
STAPLER POWER ECHELON 45 WIDE (STAPLE) IMPLANT
STAPLER RELOAD 2.5X45 WHT DVNC (STAPLE) ×1
STENT URET 6FRX24 CONTOUR (STENTS) IMPLANT
SUT ETHILON 3 0 PS 1 (SUTURE) IMPLANT
SUT MNCRL AB 4-0 PS2 18 (SUTURE) ×2 IMPLANT
SUT PDS AB 0 CT1 36 (SUTURE) ×2 IMPLANT
SUT VIC AB 0 CT1 27XBRD ANTBC (SUTURE) ×1 IMPLANT
SUT VIC AB 2-0 SH 27X BRD (SUTURE) ×1 IMPLANT
SUT VICRYL 0 UR6 27IN ABS (SUTURE) IMPLANT
SUT VLOC BARB 180 ABS3/0GR12 (SUTURE) ×1
SUTURE VLOC BRB 180 ABS3/0GR12 (SUTURE) IMPLANT
SYR TOOMEY IRRIG 70ML (MISCELLANEOUS)
SYRINGE TOOMEY IRRIG 70ML (MISCELLANEOUS) IMPLANT
TOWEL OR 17X26 10 PK STRL BLUE (TOWEL DISPOSABLE) ×1 IMPLANT
TRAY FOLEY MTR SLVR 16FR STAT (SET/KITS/TRAYS/PACK) ×1 IMPLANT
TRAY LAPAROSCOPIC (CUSTOM PROCEDURE TRAY) ×1 IMPLANT
TROCAR Z THREAD OPTICAL 12X100 (TROCAR) ×1 IMPLANT
TROCAR Z-THREAD OPTICAL 5X100M (TROCAR) IMPLANT
TUBING CONNECTING 10 (TUBING) ×1 IMPLANT
TUBING UROLOGY SET (TUBING) ×1 IMPLANT
WATER STERILE IRR 1000ML POUR (IV SOLUTION) ×1 IMPLANT

## 2023-05-26 NOTE — Brief Op Note (Signed)
  Preoperative diagnosis: 1.  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  Cystoscopy with right ureteral stent placement and TUR of the right ureteral orifice 2.  Right robotic assisted laparoscopic right nephro ureterectomy (not adrenal sparing)  Complications: None  EBL: 500 mL  Drains: 1.  18 French three-way Foley catheter with 10 mL of sterile water in the balloon  2.  Right lower quadrant JP drain

## 2023-05-26 NOTE — Discharge Instructions (Signed)
Activity:  You are encouraged to ambulate frequently (about every hour during waking hours) to help prevent blood clots from forming in your legs or lungs.  However, you should not engage in any heavy lifting (> 10-15 lbs), strenuous activity, or straining. Diet: You should advance your diet as instructed by your physician.  It will be normal to have some bloating, nausea, and abdominal discomfort intermittently. Prescriptions:  You will be provided a prescription for pain medication to take as needed.  If your pain is not severe enough to require the prescription pain medication, you may take extra strength Tylenol instead which will have less side effects.  You should also take a prescribed stool softener to avoid straining with bowel movements as the prescription pain medication may constipate you. Incisions: You may remove your dressing bandages 48 hours after surgery if not removed in the hospital.  You will either have some small staples or special tissue glue at each of the incision sites. Once the bandages are removed (if present), the incisions may stay open to air.  You may start showering (but not soaking or bathing in water) the 2nd day after surgery and the incisions simply need to be patted dry after the shower.  No additional care is needed. What to call us about: You should call the office ([phone]) if you develop fever > 101 or develop persistent vomiting.   You may resume aspirin, advil, aleve, vitamins, and supplements 7 days after surgery. 

## 2023-05-26 NOTE — Anesthesia Procedure Notes (Signed)
Procedure Name: Intubation Date/Time: 05/26/2023 7:47 AM  Performed by: Thomasene Ripple, CRNAPre-anesthesia Checklist: Patient identified, Emergency Drugs available, Suction available and Patient being monitored Patient Re-evaluated:Patient Re-evaluated prior to induction Oxygen Delivery Method: Circle System Utilized Preoxygenation: Pre-oxygenation with 100% oxygen Induction Type: IV induction Ventilation: Mask ventilation without difficulty Laryngoscope Size: Miller and 2 Grade View: Grade II Tube type: Oral Tube size: 7.0 mm Number of attempts: 1 Airway Equipment and Method: Stylet and Oral airway Placement Confirmation: ETT inserted through vocal cords under direct vision, positive ETCO2 and breath sounds checked- equal and bilateral Secured at: 21 cm Tube secured with: Tape Dental Injury: Teeth and Oropharynx as per pre-operative assessment

## 2023-05-26 NOTE — Anesthesia Postprocedure Evaluation (Signed)
Anesthesia Post Note  Patient: GINELLE HAMAR  Procedure(s) Performed: XI ROBOT ASSITED LAPAROSCOPIC NEPHROURETERECTOMY (Right) CYSTOSCOPY WITH RIGHT STENT PLACEMENT; TUR OF RIGHT URETERAL ORISICE (Right)     Patient location during evaluation: PACU Anesthesia Type: General Level of consciousness: awake and alert Pain management: pain level controlled Vital Signs Assessment: post-procedure vital signs reviewed and stable Respiratory status: spontaneous breathing, nonlabored ventilation, respiratory function stable and patient connected to nasal cannula oxygen Cardiovascular status: blood pressure returned to baseline and stable Postop Assessment: no apparent nausea or vomiting Anesthetic complications: no  No notable events documented.  Last Vitals:  Vitals:   05/26/23 1430 05/26/23 1500  BP: 109/62 106/61  Pulse: 61 60  Resp: 13 13  Temp: (!) 36.4 C   SpO2: 100% 100%    Last Pain:  Vitals:   05/26/23 1500  TempSrc:   PainSc: Asleep                 Hazaiah Edgecombe,W. EDMOND

## 2023-05-26 NOTE — Transfer of Care (Signed)
Immediate Anesthesia Transfer of Care Note  Patient: HERA DEXHEIMER  Procedure(s) Performed: XI ROBOT ASSITED LAPAROSCOPIC NEPHROURETERECTOMY (Right) CYSTOSCOPY WITH RIGHT STENT PLACEMENT; TUR OF RIGHT URETERAL ORISICE (Right)  Patient Location: PACU  Anesthesia Type:General  Level of Consciousness: awake, alert , and oriented  Airway & Oxygen Therapy: Patient Spontanous Breathing and Patient connected to face mask oxygen  Post-op Assessment: Report given to RN and Post -op Vital signs reviewed and stable  Post vital signs: Reviewed and stable  Last Vitals:  Vitals Value Taken Time  BP 142/80 05/26/23 1303  Temp    Pulse 59 05/26/23 1314  Resp 10 05/26/23 1314  SpO2 100 % 05/26/23 1314  Vitals shown include unfiled device data.  Last Pain:  Vitals:   05/26/23 1303  TempSrc:   PainSc: 8          Complications: No notable events documented.

## 2023-05-27 ENCOUNTER — Encounter (HOSPITAL_COMMUNITY): Payer: Self-pay | Admitting: Urology

## 2023-05-27 ENCOUNTER — Other Ambulatory Visit: Payer: Self-pay

## 2023-05-27 LAB — BASIC METABOLIC PANEL
Anion gap: 5 (ref 5–15)
BUN: 14 mg/dL (ref 8–23)
CO2: 25 mmol/L (ref 22–32)
Calcium: 7.5 mg/dL — ABNORMAL LOW (ref 8.9–10.3)
Chloride: 102 mmol/L (ref 98–111)
Creatinine, Ser: 1.39 mg/dL — ABNORMAL HIGH (ref 0.44–1.00)
GFR, Estimated: 41 mL/min — ABNORMAL LOW (ref >60)
Glucose, Bld: 95 mg/dL (ref 70–99)
Potassium: 4.2 mmol/L (ref 3.5–5.1)
Sodium: 132 mmol/L — ABNORMAL LOW (ref 135–145)

## 2023-05-27 LAB — HEMOGLOBIN AND HEMATOCRIT, BLOOD
HCT: 27 % — ABNORMAL LOW (ref 36.0–46.0)
Hemoglobin: 8.4 g/dL — ABNORMAL LOW (ref 12.0–15.0)

## 2023-05-27 LAB — CREATININE, FLUID (PLEURAL, PERITONEAL, JP DRAINAGE): Creat, Fluid: 1.3 mg/dL

## 2023-05-27 LAB — GLUCOSE, CAPILLARY: Glucose-Capillary: 81 mg/dL (ref 70–99)

## 2023-05-27 MED ORDER — CHLORHEXIDINE GLUCONATE CLOTH 2 % EX PADS: 6.0000 | MEDICATED_PAD | Freq: Every day | CUTANEOUS | Status: DC

## 2023-05-27 NOTE — Plan of Care (Signed)
  Problem: Education: Goal: Knowledge of General Education information will improve Description: Including pain rating scale, medication(s)/side effects and non-pharmacologic comfort measures Outcome: Adequate for Discharge   Problem: Health Behavior/Discharge Planning: Goal: Ability to manage health-related needs will improve Outcome: Adequate for Discharge   Problem: Clinical Measurements: Goal: Ability to maintain clinical measurements within normal limits will improve Outcome: Adequate for Discharge Goal: Will remain free from infection Outcome: Adequate for Discharge Goal: Diagnostic test results will improve Outcome: Adequate for Discharge Goal: Respiratory complications will improve Outcome: Adequate for Discharge Goal: Cardiovascular complication will be avoided Outcome: Adequate for Discharge   Problem: Activity: Goal: Risk for activity intolerance will decrease Outcome: Adequate for Discharge   Problem: Nutrition: Goal: Adequate nutrition will be maintained Outcome: Adequate for Discharge   Problem: Coping: Goal: Level of anxiety will decrease Outcome: Adequate for Discharge   Problem: Elimination: Goal: Will not experience complications related to bowel motility Outcome: Adequate for Discharge Goal: Will not experience complications related to urinary retention Outcome: Adequate for Discharge   Problem: Pain Management: Goal: General experience of comfort will improve Outcome: Adequate for Discharge   Problem: Safety: Goal: Ability to remain free from injury will improve Outcome: Adequate for Discharge   Problem: Skin Integrity: Goal: Risk for impaired skin integrity will decrease Outcome: Adequate for Discharge   Problem: Education: Goal: Knowledge of the prescribed therapeutic regimen will improve Outcome: Adequate for Discharge   Problem: Bowel/Gastric: Goal: Gastrointestinal status for postoperative course will improve Outcome: Adequate for  Discharge   Problem: Clinical Measurements: Goal: Postoperative complications will be avoided or minimized Outcome: Adequate for Discharge   Problem: Respiratory: Goal: Ability to achieve and maintain a regular respiratory rate will improve Outcome: Adequate for Discharge   Problem: Skin Integrity: Goal: Demonstration of wound healing without infection will improve Outcome: Adequate for Discharge   Problem: Urinary Elimination: Goal: Ability to avoid or minimize complications of infection will improve Outcome: Adequate for Discharge Goal: Ability to achieve and maintain urine output will improve Outcome: Adequate for Discharge

## 2023-05-27 NOTE — TOC Initial Note (Signed)
Transition of Care [facility]) - Initial/Assessment Note    Patient Details  Name: Evelyn Jordan MRN: [id number] Date of Birth: 10/26/1950  [facility]) CM/SW Contact:    Lanier Clam, RN Phone Number: 05/27/2023, 2:00 PM  Clinical Narrative: d/c plan home.                  Expected Discharge Plan: Home/Self Care Barriers to Discharge: Continued Medical Work up   Patient Goals and CMS Choice            Expected Discharge Plan and Services                                              Prior Living Arrangements/Services                       Activities of Daily Living   ADL Screening (condition at time of admission) Independently performs ADLs?: Yes (appropriate for developmental age) Is the patient deaf or have difficulty hearing?: No Does the patient have difficulty seeing, even when wearing glasses/contacts?: No Does the patient have difficulty concentrating, remembering, or making decisions?: No  Permission Sought/Granted                  Emotional Assessment              Admission diagnosis:  Renal mass [N28.89] Patient Active Problem List   Diagnosis Date Noted   Renal mass 05/26/2023   Hematuria 05/09/2023   AKI (acute kidney injury) (HCC) 05/09/2023   Non-small cell cancer of left lung (HCC) 04/30/2023   Pericardial effusion 04/30/2023   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   Tobacco use 01/22/2023   Port-A-Cath in place 09/08/2022   Encounter for antineoplastic chemotherapy 08/25/2022   Malignant neoplasm of unspecified part of unspecified bronchus or lung (HCC) 08/11/2022   Abnormal CT of the chest 07/29/2022   Depression, major, single episode, moderate (HCC) 04/22/2021   Pulmonary nodules 04/14/2019   Thyroid nodule 04/14/2019   Synovial cyst of popliteal space 04/05/2018   Osteoporosis 12/23/2017   Primary insomnia 12/04/2017   PCP:  [name], MD Pharmacy:   [facility]  Delivery - [place], [place] - [address] [place] [address] Phone: [phone] Fax: [phone]  [id number] [place], [place] - [address] AT [facility] [address] [place] [address] Phone: [phone] Fax: [phone]     Social Drivers of Health (SDOH) Social History: SDOH Screenings   Food Insecurity: No Food Insecurity (05/26/2023)  Housing: Low Risk  (05/26/2023)  Transportation Needs: No Transportation Needs (05/26/2023)  Utilities: Not At Risk (05/26/2023)  Alcohol Screen: Low Risk  (04/29/2023)  Depression (PHQ2-9): Low Risk  (04/30/2023)  Financial Resource Strain: Low Risk  (04/29/2023)  Physical Activity: Sufficiently Active (04/29/2023)  Social Connections: Unknown (04/29/2023)  Stress: No Stress Concern Present (04/29/2023)  Tobacco Use: Medium Risk (05/26/2023)   SDOH Interventions:     Readmission Risk Interventions     No data to display

## 2023-05-27 NOTE — Plan of Care (Signed)
  Problem: Education: Goal: Knowledge of General Education information will improve Description: Including pain rating scale, medication(s)/side effects and non-pharmacologic comfort measures Outcome: Progressing   Problem: Clinical Measurements: Goal: Will remain free from infection Outcome: Progressing   Problem: Safety: Goal: Ability to remain free from injury will improve Outcome: Progressing    Problem: Urinary Elimination: Goal: Ability to achieve and maintain urine output will improve Outcome: Progressing

## 2023-05-27 NOTE — Progress Notes (Signed)
   05/27/23 0336 05/27/23 0345  Vitals  Temp 98.9 F (37.2 C)  --   Temp Source Oral  --   BP (!) 89/53 (!) 83/54  MAP (mmHg) 65 (!) 63  BP Location Left Arm Left Arm  BP Method Automatic Automatic  Patient Position (if appropriate) Lying Lying  Pulse Rate 60 (!) 59  Pulse Rate Source Monitor Dinamap  Resp 15  --   MEWS COLOR  MEWS Score Color Green Green  Oxygen Therapy  SpO2 94 %  --   O2 Device Room Air  --   MEWS Score  MEWS Temp 0 0  MEWS Systolic 1 1  MEWS Pulse 0 0  MEWS RR 0 0  MEWS LOC 0 0  MEWS Score 1 1    Pt complained of surgical pain 7 out of 10 and asked for PRN pain medication. PRN Dilaudid was given @0243 . NT reported pt's SBP dropped to 89/53. This RN rechecked again and it was 83/54. Pt alert and oriented and pain relieved after taking the medication. Contacted urologist regarding BP and waiting for response. Will continue to monitor.

## 2023-05-27 NOTE — Discharge Summary (Signed)
Date of admission: 05/26/2023  Date of discharge: 05/27/2023  Admission diagnosis:  Renal mass [N28.89]   Discharge diagnosis:  Renal mass [N28.89]  Secondary diagnoses:   Active Ambulatory Problems    Diagnosis Date Noted   Primary insomnia 12/04/2017   Osteoporosis 12/23/2017   Synovial cyst of popliteal space 04/05/2018   Pulmonary nodules 04/14/2019   Thyroid nodule 04/14/2019   Depression, major, single episode, moderate (HCC) 04/22/2021   Abnormal CT of the chest 07/29/2022   Malignant neoplasm of unspecified part of unspecified bronchus or lung (HCC) 08/11/2022   Encounter for antineoplastic chemotherapy 08/25/2022   Port-A-Cath in place 09/08/2022   Tobacco use 01/22/2023   Non-small cell cancer of left lung (HCC) 04/30/2023   Pericardial effusion 04/30/2023   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 Briefly, Evelyn Jordan is a 72 y.o. 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  Hospital Course: Pt admitted and underwent right radical nephrectomy on 05/26/2023. Their hospital course was unremarkable. By POD1, they were tolerating a regular diet,  pain was controlled with oral medications, and they were deemed appropriate for discharge.   Their course was complicated by: None  On the day of discharge, the patient was tolerating a regular diet and their pain was well controlled. They were determined to be stable for discharge home  Pt will discharge with catheter and have foley removed at followup appointment.    Laboratory values:  Recent Labs    05/25/23 1508 05/26/23 1345 05/27/23 0514  HGB 8.4* 10.4* 8.4*  HCT 26.4* 32.2* 27.0*   Recent Labs    05/26/23 1345 05/27/23 0514  CREATININE 1.10* 1.39*    Disposition: Home  Discharge medications:  Allergies as of 05/27/2023       Reactions   Compazine [prochlorperazine Edisylate]    Neck hyperextends        Medication List     STOP taking these medications    ibuprofen 200 MG tablet Commonly known as: ADVIL   METAMUCIL PO   multivitamin with minerals Tabs tablet       TAKE these medications    acetaminophen 325 MG tablet Commonly known as: TYLENOL Take 2 tablets (650 mg total) by mouth every 6 (six) hours as needed for mild pain (pain score 1-3) (or Fever >/= 101).   albuterol 108 (90 Base) MCG/ACT inhaler Commonly known as: VENTOLIN HFA Inhale 2 puffs into the lungs every 6 (six) hours as needed for wheezing or shortness of breath.   bisacodyl 5 MG EC tablet Commonly known as: DULCOLAX Take 10-15 mg by mouth daily as needed for moderate constipation.   ciprofloxacin 500 MG tablet Commonly known as: CIPRO Take 500 mg by mouth 2 (two) times daily.   HYDROcodone-acetaminophen 5-325 MG tablet Commonly known as: NORCO/VICODIN Take 1-2 tablets by mouth every 4 (four) hours as needed for moderate pain (pain score 4-6).   levothyroxine 75 MCG tablet Commonly known as: SYNTHROID Take 1 tablet (75 mcg total) by mouth daily at 6 (six)  AM.   oxybutynin 5 MG tablet Commonly known as: DITROPAN Take 1 tablet (5 mg total) by mouth every 8 (eight) hours as needed for bladder spasms.   phenazopyridine 200 MG tablet Commonly known as: Pyridium Take 1 tablet (200 mg total) by mouth 3 (three) times daily as needed (for pain with urination).        Followup:   Follow-up Information     Rene Paci, MD Follow up.   Specialty: Urology Why: Office will call you with date and time of appt. Contact  information: [address] [place] [address] [phone]

## 2023-05-27 NOTE — Final Progress Note (Signed)
Discharge instruction discussed with patient and spouse with written copy provided.  Verbalized understanding.  Educated on proper foley care to avoid infection.  Patient changed out standard drainage bag to a leg bag independently. Reports tolerable discomfort with movement.  Awaiting transport via WC to private vehicle.  Patient and spouse expressed appreciation of care and dey further needs.

## 2023-05-27 NOTE — Progress Notes (Signed)
   1 Day Post-Op Subjective: Pt reports feeling well this AM. Softer BP's overnight (80s/50s) but asymptomatic. Good UOP JP output 177, SS in color Hg with 2 point drop today, expected postsurgical drop Abdominal exam benign  Objective: Vital signs in last 24 hours: Temp:  [95.7 F (35.4 C)-99 F (37.2 C)] 98.9 F (37.2 C) (12/18 0336) Pulse Rate:  [50-65] 59 (12/18 0345) Resp:  [8-20] 15 (12/18 0336) BP: (83-159)/(51-84) 83/54 (12/18 0345) SpO2:  [94 %-100 %] 94 % (12/18 0336)  Intake/Output from previous day: 12/17 0701 - 12/18 0700 In: 2358.8 [I.V.:1843.8; Blood:315; IV Piggyback:200] Out: 1507 [Urine:830; Drains:177; Blood:500]  Intake/Output this shift: No intake/output data recorded.  Physical Exam:  General: Alert and oriented CV: No cyanosis Lungs: equal chest rise Abdomen: Soft, NTND, no rebound or guarding Skin: Gu: catheter with clear yellow urine  Lab Results: Recent Labs    05/25/23 1508 05/26/23 1345 05/27/23 0514  HGB 8.4* 10.4* 8.4*  HCT 26.4* 32.2* 27.0*   BMET Recent Labs    05/26/23 1345 05/27/23 0514  NA 134* 132*  K 4.5 4.2  CL 102 102  CO2 23 25  GLUCOSE 192* 95  BUN 13 14  CREATININE 1.10* 1.39*  CALCIUM 7.8* 7.5*     Studies/Results: DG C-Arm 1-60 Min-No Report Result Date: 05/26/2023 Fluoroscopy was utilized by the requesting physician.  No radiographic interpretation.   Assessment/Plan: #POD1 nephroureterectomy -ambulation today, incentive spirometry  -medlock -will remove JP drain prior to discharge -will need foley catheter teaching - will go home with catheter -path pending.  Roby Lofts, MD Resident Physician [facility]     LOS: 1 day   Roby Lofts, MD Resident [facility]ician [facility]   05/27/2023, 7:39 AM

## 2023-05-27 NOTE — Consult Note (Signed)
[facility] Liaison Consult Note    05/27/2023  Evelyn Jordan January 22, 1951 [id number]  Insurance: Medicare ACO REACH   Primary Care Provider: [name], MD,  at [facility], this provider is listed for the transition of care follow up appointments  and [facility] calls   [facility] Liaison screened the patient remotely at [facility].  Attempted call at bedside to follow up with patient for post hospital outreach with [facility] team.    The patient was screened for 30 day readmission hospitalization with noted low risk score for unplanned readmission risk 2 hospital admissions in 6 months.  The patient was assessed for potential Community Care Coordination service needs for post hospital transition for care coordination. Review of patient's electronic medical record reveals patient is for home today, reviewed inpatient [facility] team notes.   Plan: [facility] Liaison will continue to follow progress and disposition to asess for post hospital community care coordination/management needs.  Referral request for community care coordination: anticipate [facility] TOC team follow up call.   [facility] Community Care, Population Health does not replace or interfere with any arrangements made by the Inpatient Transition of Care team.   For questions contact:   Charlesetta Shanks, RN, BSN, CCM [place]  [facility], Population Health, [facility] Liaison Direct Dial: [phone] or secure chat Email: [name]

## 2023-05-27 NOTE — Op Note (Signed)
Operative Note  Preoperative diagnosis:  1.  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  Cystoscopy with right ureteral stent placement and TUR of the right ureteral orifice 2.  Robot-assisted laparoscopic right nephro ureterectomy (not adrenal sparing)  Surgeon: Rhoderick Moody, MD  Assistants:. 1.  Harrie Foreman, PA-C An assistant was required for this surgical procedure.  The duties of the assistant included but were not limited to suctioning, passing suture, camera manipulation, retraction.  This procedure would not be able to be performed without an Geophysicist/field seismologist.  2.  Michaelene Song, MD PGY 4  Anesthesia:  General  Complications:  None  EBL: 500 mL  Specimens: 1.  Right kidney, right adrenal gland and ureter  Drains/Catheters: 1.  18 French three-way Foley catheter with 10 mL of sterile water in the balloon 2.  Right lower quadrant JP drain  Intraoperative findings:   The right renal hilum and for renal vena cava were extensively encased with reactive lymphatic tissue Right renal hilum was hemostatic at the conclusion of the case Watertight bladder closure  Indication:  Evelyn Jordan is a 72 y.o. female with a large right renal pelvis mass with biopsy confirmed urothelial carcinoma with possible squamous differentiation.  She has been consented for the above procedures, voices understanding and wishes to proceed.  Description of procedure:  fter informed consent was signed, the patient was taken back to the operating room and properly anesthetized.    He was placed in the dorsolithotomy position and prepped and draped in usual sterile fashion.  A timeout was then performed.  A 23 French rigid cystoscope was then inserted into the urethra and advanced into the bladder.  A complete bladder survey revealed no  intravesical lesions.  A Glidewire was then advanced up the right ureter and up to the right renal pelvis.  A 6 French, 24 cm ureteral stent was then advanced over the wire and into position within the right collecting system.  The the rigid cystoscope was then exchanged for a 26 [place] resectoscope with a hot [employer] working Radiographer, therapeutic.  The right ureteral orifice was then circumferentially incised down to perivesical fat.  An 46 French three-way Foley catheter was then sterilely placed and the bladder was set to gravity drainage.  The patient was then placed in the left lateral decubitus position with all pressure points padded.  The abdomen was then prepped and draped in the usual sterile fashion.  A time-out was then performed.     An 8 mm incision was then made lateral to the right rectus muscle at the level of the right 12th rib.  A Veress needle was then used to access the abdominal cavity.  A saline drop test showed no signs of obstruction and aspiration of the Veress needle revealed no blood or sucus.  The abdominal cavity was then insufflated to 15 mmHg.  An 8 mm robotic trocar was then atraumatically inserted into the abdominal cavity.  The robotic camera was then inserted through the port and inspection of the abdominal cavity revealed no evidence of adjacent organ or vessel injury. We then placed three additional 8 mm robotic ports and a 12 mm assistant port in such as fashion as to triangulate the right renal hilum.  The robot was then docked into postion.   Using a combination of blunt and cold scissors dissection, the hepatic attachments were  released from the abdominal sidewall.  A locking grasper was then inserted through the 5 mm sub-xyphoid port and used to retract the posterior surface of the liver more cephalad.  The white line of Toldt along the ascending colon was then incised, allowing [place] to reflect the colon medially and expose the anterior surface of the right kidney.  The  duodenum was then Kocherized medially, which abruptly led [place] to the identification of the inferior vena cava.    Once the colon was adequately mobilized, we moved to the lower pole and identified the gonadal vein and ureter.  The gonadal vein was then left running parallel to the vena cava and the right ureter was reflected anteriorly.  Using cautious cautery, the overlying perihilar attachments were then released.  This yielded visualization of the renal hilum, which included a single right renal vein and a single right renal artery.  The right renal hilum and infrarenal and cava were extensively encased with highly reactive lymphatic tissue that could not be completely excised due to risk of exsanguination.  A 45 mm powered endovascular stapler was then used to ligate the right renal artery and vein, individually.  The right hilar stumps were hemostatic following staple ligation. The remaining perinephric attachments were then incised using electrocautery. Reinspection of the right retroperitoneal space revealed excellent hemostasis.  The right ureter was then traced down to the bladder hiatus.  Prior to removal of the right ureter, a 3-0 V-Lock suture was placed superior to the insertion point of the ureter.  The right ureter was then detached from the bladder hiatus and the aperture of the bladder was closed using the 3-0 V lock suture.  The bladder was then filled with continuous irrigation and no evidence of a leak could be identified.  The robot was then dedocked.  A 10 [place] JP drain was then placed down in the pelvis adjacent to the bladder.   A right lower quadrant Gibson incision was then made in the right kidney and ureter were removed within the Endo Catch bag.  The fascia of the external and internal oblique were then closed with a running 0 PDS suture.    The fascia of the 12 mm assistant port was then closed using a 0 Vicryl in a figure-of-eight fashion.  The skin incisions were then closed  using 4-0 Monocryl.  Dermabond was applied to all skin incisions.  Plan: Monitor on the floor overnight

## 2023-05-28 ENCOUNTER — Telehealth: Payer: Self-pay

## 2023-05-28 NOTE — Transitions of Care (Post Inpatient/ED Visit) (Signed)
05/28/2023  Name: Evelyn Jordan MRN: [id number] DOB: 02-Dec-1950  Today's TOC FU Call Status: Today's TOC FU Call Status:: Successful TOC FU Call Completed TOC FU Call Complete Date: 05/28/23 Patient's Name and Date of Birth confirmed.  Transition Care Management Follow-up Telephone Call Date of Discharge: 05/27/23 Discharge Facility: [facility]) Type of Discharge: Inpatient Admission Primary Inpatient Discharge Diagnosis:: "renal mass" How have you been since you were released from the hospital?: Better (Pt voices she is doing ok-rested fairly well last night-current pain 5/10-took 1 pain pill about 3hrs ago-only taking 1 tab q6hs-pain is managed-appetite good, no BM yet, foley intact-clear,yellow urine-no issues) Any questions or concerns?: No  Items Reviewed: Did you receive and understand the discharge instructions provided?: Yes Medications obtained,verified, and reconciled?: Yes (Medications Reviewed) (pt is out of Cipro she was taking prior to admission-states urologist ordered it-she will call and follow up to see if she needs refill and should continue taking med) Any new allergies since your discharge?: No Dietary orders reviewed?: Yes Type of Diet Ordered:: low salt/heart healthy Do you have support at home?: Yes People in Home: spouse Name of Support/Comfort Primary Source: Archie  Medications Reviewed Today: Medications Reviewed Today     Reviewed by Charlyn Minerva, RN (Registered Nurse) on 05/28/23 at 1212  Med List Status: <None>   Medication Order Taking? Sig Documenting Provider Last Dose Status Informant  acetaminophen (TYLENOL) 325 MG tablet [id number] Yes Take 2 tablets (650 mg total) by mouth every 6 (six) hours as needed for mild pain (pain score 1-3) (or Fever >/= 101). Alwyn Ren, MD Taking Active Self  albuterol (VENTOLIN HFA) 108 (90 Base) MCG/ACT inhaler [id number] Yes Inhale 2 puffs into the lungs every 6 (six) hours as needed  for wheezing or shortness of breath. Leslye Peer, MD Taking Active Self  bisacodyl (DULCOLAX) 5 MG EC tablet [id number] Yes Take 10-15 mg by mouth daily as needed for moderate constipation. [provider] Taking Active Self  ciprofloxacin (CIPRO) 500 MG tablet [id number]  Take 500 mg by mouth 2 (two) times daily. [provider]  Active Self           Med Note Estill Cotta May 28, 2023 12:11 PM) Pt reports out of meds-will call pharmacy/provider to request refill  HYDROcodone-acetaminophen (NORCO/VICODIN) 5-325 MG tablet [id number] Yes Take 1-2 tablets by mouth every 4 (four) hours as needed for moderate pain (pain score 4-6). Harrie Foreman, PA-C 05/28/2023 Active   levothyroxine (SYNTHROID) 75 MCG tablet [id number] Yes Take 1 tablet (75 mcg total) by mouth daily at 6 (six) AM. Alwyn Ren, MD Taking Active Self  oxybutynin (DITROPAN) 5 MG tablet [id number] Yes Take 1 tablet (5 mg total) by mouth every 8 (eight) hours as needed for bladder spasms. Rene Paci, MD Taking Active   phenazopyridine (PYRIDIUM) 200 MG tablet [id number] Yes Take 1 tablet (200 mg total) by mouth 3 (three) times daily as needed (for pain with urination). Rene Paci, MD Taking Active             Home Care and Equipment/Supplies: Were Home Health Services Ordered?: NA Any new equipment or medical supplies ordered?: NA  Functional Questionnaire: Do you need assistance with bathing/showering or dressing?: No Do you need assistance with meal preparation?: No Do you need assistance with eating?: No Do you have difficulty maintaining continence: No Do you need assistance with getting out of bed/getting out of a chair/moving?: No  Do you have difficulty managing or taking your medications?: No  Follow up appointments reviewed: PCP Follow-up appointment confirmed?: NA Specialist Hospital Follow-up appointment confirmed?: Yes Date of Specialist  follow-up appointment?: 06/01/23 Follow-Up Specialty Provider:: Dr. Trecia Rogers,   Dr. Mohamed(oncology) Do you need transportation to your follow-up appointment?: No Do you understand care options if your condition(s) worsen?: Yes-patient verbalized understanding  SDOH Interventions Today    Flowsheet Row Most Recent Value  SDOH Interventions   Food Insecurity Interventions Intervention Not Indicated  Housing Interventions Intervention Not Indicated  Transportation Interventions Intervention Not Indicated  Utilities Interventions Intervention Not Indicated      TOC Interventions Today    Flowsheet Row Most Recent Value  TOC Interventions   TOC Interventions Discussed/Reviewed TOC Interventions Discussed, Post discharge activity limitations per provider, Post op wound/incision care, S/S of infection      Interventions Today    Flowsheet Row Most Recent Value  General Interventions   General Interventions Discussed/Reviewed General Interventions Discussed, Doctor Visits  Doctor Visits Discussed/Reviewed Doctor Visits Discussed, PCP, Specialist  PCP/Specialist Visits Compliance with follow-up visit  Education Interventions   Education Provided Provided Education  Provided Verbal Education On Nutrition, Medication, Other, When to see the doctor  Nutrition Interventions   Nutrition Discussed/Reviewed Nutrition Discussed  Pharmacy Interventions   Pharmacy Dicussed/Reviewed Pharmacy Topics Discussed, Medications and their functions  Safety Interventions   Safety Discussed/Reviewed Safety Discussed        Antionette Fairy, RN,BSN,CCM RN Care Manager Transitions of Care  [place]/Population Health  Direct Phone: [phone] Toll Free: [phone] Fax: [phone]

## 2023-05-30 LAB — TYPE AND SCREEN
ABO/RH(D): A POS
Antibody Screen: NEGATIVE
Unit division: 0
Unit division: 0

## 2023-05-30 LAB — BPAM RBC
Blood Product Expiration Date: 202501122359
Blood Product Expiration Date: 202501142359
ISSUE DATE / TIME: 202412170817
ISSUE DATE / TIME: 202412170817
Unit Type and Rh: 6200
Unit Type and Rh: 6200

## 2023-05-31 ENCOUNTER — Encounter: Payer: Self-pay | Admitting: Internal Medicine

## 2023-05-31 LAB — SURGICAL PATHOLOGY

## 2023-06-01 ENCOUNTER — Encounter: Payer: Self-pay | Admitting: Family Medicine

## 2023-06-01 ENCOUNTER — Other Ambulatory Visit: Payer: Self-pay

## 2023-06-01 ENCOUNTER — Telehealth: Payer: Self-pay

## 2023-06-01 ENCOUNTER — Other Ambulatory Visit: Payer: Medicare Other

## 2023-06-01 DIAGNOSIS — C651 Malignant neoplasm of right renal pelvis: Secondary | ICD-10-CM | POA: Diagnosis not present

## 2023-06-01 DIAGNOSIS — Z1211 Encounter for screening for malignant neoplasm of colon: Secondary | ICD-10-CM

## 2023-06-01 NOTE — Telephone Encounter (Signed)
Patient sent mychart message about changing appt with Dr. Arbutus Ped in January to be seen sooner.  Spoke with patient and changed appt to 12/30 @ 1030.  Patient voiced happiness.

## 2023-06-08 ENCOUNTER — Telehealth: Payer: Self-pay | Admitting: Emergency Medicine

## 2023-06-08 ENCOUNTER — Encounter: Payer: Self-pay | Admitting: Emergency Medicine

## 2023-06-08 ENCOUNTER — Inpatient Hospital Stay (HOSPITAL_BASED_OUTPATIENT_CLINIC_OR_DEPARTMENT_OTHER): Payer: Medicare Other | Admitting: Physician Assistant

## 2023-06-08 VITALS — BP 126/85 | HR 95 | Temp 98.4°F | Resp 16 | Wt 114.6 lb

## 2023-06-08 DIAGNOSIS — C679 Malignant neoplasm of bladder, unspecified: Secondary | ICD-10-CM | POA: Diagnosis not present

## 2023-06-08 DIAGNOSIS — C7800 Secondary malignant neoplasm of unspecified lung: Secondary | ICD-10-CM | POA: Insufficient documentation

## 2023-06-08 DIAGNOSIS — Z7189 Other specified counseling: Secondary | ICD-10-CM | POA: Diagnosis not present

## 2023-06-08 DIAGNOSIS — R918 Other nonspecific abnormal finding of lung field: Secondary | ICD-10-CM

## 2023-06-08 DIAGNOSIS — C3490 Malignant neoplasm of unspecified part of unspecified bronchus or lung: Secondary | ICD-10-CM

## 2023-06-08 DIAGNOSIS — Z452 Encounter for adjustment and management of vascular access device: Secondary | ICD-10-CM | POA: Diagnosis not present

## 2023-06-08 DIAGNOSIS — C3412 Malignant neoplasm of upper lobe, left bronchus or lung: Secondary | ICD-10-CM | POA: Diagnosis not present

## 2023-06-08 DIAGNOSIS — C689 Malignant neoplasm of urinary organ, unspecified: Secondary | ICD-10-CM

## 2023-06-08 DIAGNOSIS — Z905 Acquired absence of kidney: Secondary | ICD-10-CM | POA: Diagnosis not present

## 2023-06-08 DIAGNOSIS — Z923 Personal history of irradiation: Secondary | ICD-10-CM | POA: Diagnosis not present

## 2023-06-08 MED ORDER — ONDANSETRON HCL 8 MG PO TABS: 8.0000 mg | ORAL_TABLET | Freq: Three times a day (TID) | ORAL | 2 refills | Status: DC | PRN

## 2023-06-08 MED ORDER — LEVOTHYROXINE SODIUM 100 MCG PO TABS: 100.0000 ug | ORAL_TABLET | Freq: Every day | ORAL | 2 refills | Status: DC

## 2023-06-08 NOTE — Telephone Encounter (Signed)
Received urgent referral for already established patient. Dr Shirline Frees is wanting for the patient to be seen sooner than 1/30 to evaluate for bronch. LOV 04/09/23. Only available slots are during the 1pm holds on RB schedule on 1/16, 1/22 and 1/23. Please note that her PET is scheduled for 1/6 and the upcoming appointment is primarily to review that. Dr Delton Coombes, do you want to see patient sooner than 1/30?

## 2023-06-08 NOTE — Telephone Encounter (Signed)
Called the patient discussed her CT findings, PET findings.  Agree that she needs navigational bronchoscopy.  Reviewed the pros and cons with her and she understands the rationale and the plan.  She agrees.  I will place orders to get this scheduled on 06/21/2022.

## 2023-06-08 NOTE — Progress Notes (Signed)
START ON PATHWAY REGIMEN - Bladder     A cycle is every 21 days:     Gemcitabine      Carboplatin   **Always confirm dose/schedule in your pharmacy ordering system**  Patient Characteristics: Advanced/Metastatic Disease, First Line, No Prior Platinum-Based Therapy OR Prior Platinum-Based Therapy and Relapse > 12 Months, Not a Candidate for Immunotherapy/ADC, Poor Renal Function (CrCl < 60 mL/min) Therapeutic Status: Advanced/Metastatic Disease Line of Therapy: First Line Prior Therapy and Relapse Status: No Prior Platinum-Based Therapy Renal Function: Poor Renal Function (CrCl < 60 mL/min) Intent of Therapy: Non-Curative / Palliative Intent, Discussed with Patient

## 2023-06-08 NOTE — Progress Notes (Signed)
[facility] OFFICE PROGRESS NOTE  [name], MD [address] [place] [address]  DIAGNOSIS:  1) Stage IIIB (T3, N2, M0) non-small cell lung cancer, squamous cell carcinoma presented with obstructive left lingular mass with consolidation and collapse of the lingula as well as part of the left upper lobe and likely invasion of the mediastinum diagnosed in February 2024.  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 CCND1 amplification FGFR1 amplification MDM2 amplification FGF19 amplification FGF3 amplification FGF4 amplification TP53 Q136* 8 Disease relevant genes with no reportable alterations: ALK, BRAF, EGFR, ERBB2, KRAS, MET, RET, ROS1   PDL1: 5%  PRIOR THERAPY:  1) Concurrent chemoradiation with weekly carboplatin for AUC of 2 and paclitaxel 45 Mg/M2. Status post 7 weeks of treatment.  Last dose was given October 06, 2022 with partial response. 2) Consolidation treatment with immunotherapy with Imfinzi 1500 Mg IV every 4 weeks.  First dose on November 13, 2022.  Status post 2 cycles.  This was discontinued secondary to toxicity and suspicious immunotherapy mediated pneumonitis. 3) Right Kidney and Ureter Nephrectomy under the care of Dr. Sande Brothers. Tumor extensively involves renal pelvis, extends through the kidney into the perinephric fat with lymphovascular invasion and positive margin at the peri soft tissue of the renal vein  CURRENT THERAPY: Carboplatin on day 1 and gemcitabine on days 1 and 8 IV every 3 weeks. First dose expected on 06/15/2023  INTERVAL HISTORY: Evelyn Jordan 72 y.o. female returns to the clinic today for follow-up visit.  The patient was last seen in the clinic on 03/11/2023.  The patient has a history of  stage III non-small cell lung cancer for which she underwent concurrent chemoradiation and 2 cycles of consolidation immunotherapy with Imfinzi. Imfinzi  was discontinued secondary to pneumonitis.  She has been on observation since July 2024.  In the interval since last being seen, she saw her PCP for the chief complaint of right sided abdominal pain. This started about 2 weeks or so after her restaging CT scan in September 2024, which did not show any abnormalities in her upper abdomen. She trailed out a PPI (since she had recently completed high dose prednisone taper) without any relief. She followed up with her PCP who ordered a CT of the abdomen pelvis which showed abnormality of the upper and mid right kidney with associated necrotic retrocaval lymph node and the findings were concerning for possible urothelial neoplasm.  There is also a new 6 mm noncalcified right lower lobe pulmonary nodule.  A further scan performed 8 days later in the ER (presented for gross hematuria) which showed of the abdomen pelvis on 05/09/2023 showed a 6.1 x 5.4 cm hypoenhancement of the posterior mid/lower right kidney.    She subsequently established care with Dr. Sande Brothers.  He performed cystoscopy on 05/15/2023 and the biopsy showed urothelial carcinoma with squamous cell features. She had post op follow up follow up last week. We will reach out to the office for an office note.  She underwent open radical nephroureterectomy on 05/26/2023. She is recovering well. However, her surgical specimen shows high grade urothelial carcinoma with squamous differentiation. Tumor with predominant squamous differentiation diffusely involves renal pelvis and extend through kidney into perirenal fat and renal sinus fat. Large vein invasion is identified. The  renal vein soft tissue margin is positive for carcinoma.   She is scheduled to see Dr. Kathrynn Running on 06/23/2023 to discuss the role of adjuvant radiation.   She is accompanied by her  family today. They reported gradual recovery with persistent pain at the operative site, which was managed with Vicodin, ibuprofen, and Tylenol. The patient reported night sweats for the past two months and a cough, which has improved recently. They also reported constipation, which was managed with Doculax.  The patient expressed concerns about the quality of life versus quantity of life in the context of treatment decisions. They recalled a previous adverse reaction to immunotherapy and expressed apprehension about undergoing similar treatments. The patient was open to starting treatment as soon as possible but wanted to ensure that the treatment would not significantly degrade their quality of life.  Otherwise, she denies fevers or chills. It looks like she lost a few pounds since having surgery. She denies recent URI. She is not having anymore gross hematuria. Denies dysuria. She only has shortness of breath with exertion. Denies nausea or vomiting.     MEDICAL HISTORY: Past Medical History:  Diagnosis Date   Depression    Fatty liver    Insomnia    Lung cancer (HCC) 07/25/2022   Osteoporosis    Pericardial effusion 03/05/2023   small noted on ECHO   Pulmonary nodule    Thyroid nodule     ALLERGIES:  is allergic to compazine [prochlorperazine edisylate].  MEDICATIONS:  Current Outpatient Medications  Medication Sig Dispense Refill   acetaminophen (TYLENOL) 325 MG tablet Take 2 tablets (650 mg total) by mouth every 6 (six) hours as needed for mild pain (pain score 1-3) (or Fever >/= 101).     albuterol (VENTOLIN HFA) 108 (90 Base) MCG/ACT inhaler Inhale 2 puffs into the lungs every 6 (six) hours as needed for wheezing or shortness of breath. 8 g 6   bisacodyl (DULCOLAX) 5 MG EC tablet Take 10-15 mg by mouth daily as needed for moderate constipation.     HYDROcodone-acetaminophen (NORCO/VICODIN) 5-325 MG tablet Take 1-2 tablets by mouth every 4 (four) hours as needed for moderate pain  (pain score 4-6). 20 tablet 0   ondansetron (ZOFRAN) 8 MG tablet Take 1 tablet (8 mg total) by mouth every 8 (eight) hours as needed for nausea or vomiting. Start after day 3 of chemotherapy if needed 30 tablet 2   ciprofloxacin (CIPRO) 500 MG tablet Take 500 mg by mouth 2 (two) times daily.     levothyroxine (SYNTHROID) 100 MCG tablet Take 1 tablet (100 mcg total) by mouth daily at 6 (six) AM. 30 tablet 2   oxybutynin (DITROPAN) 5 MG tablet Take 1 tablet (5 mg total) by mouth every 8 (eight) hours as needed for bladder spasms. 30 tablet 1   phenazopyridine (PYRIDIUM) 200 MG tablet Take 1 tablet (200 mg total) by mouth 3 (three) times daily as needed (for pain with urination). 30 tablet 0   No current facility-administered medications for this visit.    SURGICAL HISTORY:  Past Surgical History:  Procedure Laterality Date   ANKLE SURGERY Right    BREAST BIOPSY Right    benign 2013   BRONCHIAL BIOPSY  08/04/2022   Procedure: BRONCHIAL BIOPSIES;  Surgeon: Leslye Peer, MD;  Location[facility] ENDOSCOPY;  Service: Cardiopulmonary;;   BRONCHIAL BRUSHINGS  08/04/2022   Procedure: BRONCHIAL BRUSHINGS;  Surgeon: Leslye Peer, MD;  Location: [facility] ENDOSCOPY;  Service: Cardiopulmonary;;   COLONOSCOPY  CYSTOSCOPY WITH BIOPSY N/A 05/15/2023   Procedure: CYSTOSCOPY WITH RIGHT RENAL PELVIS BIOPSY;  Surgeon: Rene Paci, MD;  Location: [facility] ORS;  Service: Urology;  Laterality: N/A;   CYSTOSCOPY WITH STENT PLACEMENT Right 05/26/2023   Procedure: CYSTOSCOPY WITH RIGHT STENT PLACEMENT; TUR OF RIGHT URETERAL ORISICE;  Surgeon: Rene Paci, MD;  Location: [facility] ORS;  Service: Urology;  Laterality: Right;   CYSTOSCOPY/RETROGRADE/URETEROSCOPY N/A 05/15/2023   Procedure: CYSTOSCOPY, RIGHT RETROGRADE, RIGHT URETEROSCOPY, RIGHT STENT PLACEMENT;  Surgeon: Rene Paci, MD;  Location: [facility] ORS;  Service: Urology;  Laterality: N/A;  30 MINUTES   HEMOSTASIS CONTROL  08/04/2022    Procedure: HEMOSTASIS CONTROL;  Surgeon: Leslye Peer, MD;  Location: [facility] ENDOSCOPY;  Service: Cardiopulmonary;;   IR IMAGING GUIDED PORT INSERTION  09/04/2022   repair of a torn meniscus Left 05/2018   ROBOT ASSITED LAPAROSCOPIC NEPHROURETERECTOMY Right 05/26/2023   Procedure: XI ROBOT ASSITED LAPAROSCOPIC NEPHROURETERECTOMY;  Surgeon: Rene Paci, MD;  Location: [facility] ORS;  Service: Urology;  Laterality: Right;  210 MINS FOR CASE   VIDEO BRONCHOSCOPY Left 08/04/2022   Procedure: VIDEO BRONCHOSCOPY WITH FLUORO;  Surgeon: Leslye Peer, MD;  Location: [facility] ENDOSCOPY;  Service: Cardiopulmonary;  Laterality: Left;    REVIEW OF SYSTEMS:   Review of Systems  Constitutional: Positive for weight loss. Negative for appetite change, chills,  fever.  HENT: Negative for mouth sores, nosebleeds, sore throat and trouble swallowing.   Eyes: Negative for eye problems and icterus.  Respiratory: Stable dyspnea on exertion. Improving cough. Negative for  hemoptysis, and wheezing.   Cardiovascular: Negative for chest pain and leg swelling.  Gastrointestinal: Positive for post surgical pain (improving). Negative for  constipation, diarrhea, nausea and vomiting.  Genitourinary: Negative for bladder incontinence, difficulty urinating, dysuria, frequency and hematuria.   Musculoskeletal: Negative for back pain, gait problem, neck pain and neck stiffness.  Skin: Negative for itching and rash.  Neurological: Negative for dizziness, extremity weakness, gait problem, headaches, light-headedness and seizures.  Hematological: Negative for adenopathy. Does not bruise/bleed easily.  Psychiatric/Behavioral: Negative for confusion, depression and sleep disturbance. The patient is not nervous/anxious.     PHYSICAL EXAMINATION:  Blood pressure 126/85, pulse 95, temperature 98.4 F (36.9 C), temperature source Temporal, resp. rate 16, weight 114 lb 9.6 oz (52 kg).  ECOG PERFORMANCE STATUS: 1  Physical Exam   Constitutional: Oriented to person, place, and time and thin appearing female, and in no distress.  HENT:  Head: Normocephalic and atraumatic.  Mouth/Throat: Oropharynx is clear and moist. No oropharyngeal exudate.  Eyes: Conjunctivae are normal. Right eye exhibits no discharge. Left eye exhibits no discharge. No scleral icterus.  Neck: Normal range of motion. Neck supple.  Cardiovascular: Normal rate, regular rhythm, normal heart sounds and intact distal pulses.   Pulmonary/Chest: Effort normal and breath sounds normal. No respiratory distress. No wheezes. No rales.  Abdominal: Soft. Bowel sounds are normal. Exhibits no distension and no mass. There is no tenderness.  Musculoskeletal: Normal range of motion. Exhibits no edema.  Lymphadenopathy:    No cervical adenopathy.  Neurological: Alert and oriented to person, place, and time. Exhibits normal muscle tone. Gait normal. Coordination normal.  Skin: Skin is warm and dry. No rash noted. Not diaphoretic. No erythema. No pallor.  Psychiatric: Mood, memory and judgment normal.  Vitals reviewed.  LABORATORY DATA: Lab Results  Component Value Date   WBC 10.4 05/26/2023   HGB 8.4 (L) 05/27/2023   HCT 27.0 (L) 05/27/2023   MCV 95.3  05/26/2023   PLT 291 05/26/2023      Chemistry      Component Value Date/Time   NA 132 (L) 05/27/2023 0514   NA 141 03/07/2015 0000   K 4.2 05/27/2023 0514   CL 102 05/27/2023 0514   CO2 25 05/27/2023 0514   BUN 14 05/27/2023 0514   CREATININE 1.39 (H) 05/27/2023 0514   CREATININE 0.95 05/25/2023 1508   GLU 103 03/07/2015 0000      Component Value Date/Time   CALCIUM 7.5 (L) 05/27/2023 0514   ALKPHOS 48 05/25/2023 1508   AST 13 (L) 05/25/2023 1508   ALT 10 05/25/2023 1508   BILITOT 0.3 05/25/2023 1508       RADIOGRAPHIC STUDIES:  CT Chest W Contrast Result Date: 06/06/2023 CLINICAL DATA:  Non-small cell lung cancer, for staging EXAM: CT CHEST WITH CONTRAST TECHNIQUE: Multidetector CT  imaging of the chest was performed during intravenous contrast administration. RADIATION DOSE REDUCTION: This exam was performed according to the departmental dose-optimization program which includes automated exposure control, adjustment of the mA and/or kV according to patient size and/or use of iterative reconstruction technique. CONTRAST:  75mL OMNIPAQUE IOHEXOL 300 MG/ML  SOLN COMPARISON:  03/05/2023 FINDINGS: Cardiovascular: The heart is normal in size. Small pericardial effusion. Leftward cardiomediastinal shift. No evidence of thoracic aortic aneurysm. Mild atherosclerotic calcifications of the aortic arch. Moderate three-vessel coronary atherosclerosis. Right chest port terminates in the upper right atrium. Mediastinum/Nodes: No suspicious mediastinal lymphadenopathy. Visualized thyroid is unremarkable. Lungs/Pleura: Radiation changes with volume loss in the lingula and central left lower lobe. Underlying prior lingular nodule is obscured. 2.8 x 1.3 cm nodular opacity in the medial left upper lobe abutting the mediastinum (series 2/image 88), new. 12 x 9 mm nodule in the posterior left upper lobe (series 7/image 85), previously 7 x 5 mm. Additional scattered bilateral pulmonary nodules, approximately 20-25 in number, new from prior CT chest and new/progressive from prior CT abdomen/pelvis and cardiac CT. Index nodules include: --4 mm right upper lobe nodule (series 7/image 36) --5 mm medial left upper lobe nodule (series 7/image 47) --8 mm anterior right lower lobe nodule (series 7/image 89) --6 mm posterior right lower lobe nodule (series 7/image 113) Additional calcified granulomata in the right lung, benign. No focal consolidation. Mild centrilobular and paraseptal emphysematous changes, upper lung predominant. Small left pleural effusion.  No pneumothorax. Upper Abdomen: Visualized upper abdomen is grossly unremarkable. Musculoskeletal: Mild degenerative changes of the visualized thoracolumbar spine.  IMPRESSION: Radiation changes in the lingula and central left lower lobe. Underlying prior lingular nodule is obscured. New/progressive bilateral pulmonary nodules, as described above, compatible with widespread pulmonary metastases. Small left pleural effusion. Aortic Atherosclerosis (ICD10-I70.0) and Emphysema (ICD10-J43.9). Electronically Signed   By: Charline Bills M.D.   On: 06/06/2023 01:33   DG C-Arm 1-60 Min-No Report Result Date: 05/26/2023 Fluoroscopy was utilized by the requesting physician.  No radiographic interpretation.   DG C-Arm 1-60 Min-No Report Result Date: 05/15/2023 Fluoroscopy was utilized by the requesting physician.  No radiographic interpretation.   CT ABDOMEN PELVIS W CONTRAST Result Date: 05/09/2023 CLINICAL DATA:  Right-sided abdominal pain for 6 weeks with hematuria. Known left hilar/perihilar bronchogenic carcinoma diagnosed with bronchoscopy 08/04/2022. EXAM: CT ABDOMEN AND PELVIS WITH CONTRAST TECHNIQUE: Multidetector CT imaging of the abdomen and pelvis was performed using the standard protocol following bolus administration of intravenous contrast. RADIATION DOSE REDUCTION: This exam was performed according to the departmental dose-optimization program which includes automated exposure control, adjustment of the mA and/or kV  according to patient size and/or use of iterative reconstruction technique. CONTRAST:  OMNIPAQUE IOHEXOL 300 MG/ML  SOLN COMPARISON:  PET-CT 08/13/2022, CT abdomen pelvis with IV contrast last week 05/01/2023. Also chest CT with contrast 03/05/2023. FINDINGS: Lower chest: There is scarring and tenting along the left hemidiaphragm. There is trace left subpulmonic loculated pleural fluid. There is a new 3 mm left lower lobe nodule posterior to the left hemidiaphragm on 6:25 and a new 3 mm nodule in the anterolateral left base on the first image, concerning for metastases. There is a 6 mm right lower solid nodule posteriorly on 6:21 also new  from the PET-CT and compared with the chest CT from 03/05/2023 and also most likely a metastasis. A densely calcified right lower lobe granuloma is again seen, scattered linear scar-like opacities in the right base. The heart is slightly enlarged. Small pericardial effusion collection inferior to the heart. Small hiatal hernia. Hepatobiliary: The liver is mildly steatotic including with focal periligamentous fat in segment 4 B. No metastasis is seen. Unremarkable gallbladder, bile ducts. Pancreas: No mass enhancement, ductal dilatation or inflammatory changes. Spleen: No mass enhancement.  No splenomegaly. Adrenals/Urinary Tract: There is no adrenal mass. No focal left renal mass enhancement. Both renal arteries and veins opacify well. In the posterior mid to lower right kidney, again noted is a 6.1 x 5.4 cm area of heterogeneous hypoenhancement with adjacent inflammatory reaction, little changed in overall size but with increased appearance of central liquefaction necrosis. Mild urothelial thickening in the adjacent right renal pelvis appears similar to 8 days ago and there is again lack of excretion from the involved parenchyma into the calices, which are effaced within the abnormality. Infection with potentially a developing renal abscess and tumor could both have this appearance, although infection is favored given that this was not present on 03/05/2023. Further supporting an infectious etiology, there is again noted faint hypoenhancement over portions of the posterior mid left kidney best seen on the delayed images and likely indicating pyelonephritis. There is no evidence of urinary stones or obstruction. The bladder is unremarkable for the degree of distention. Stomach/Bowel: No dilatation or overt wall thickening. The gastric wall is contracted. An appendix is not seen in this patient. There is moderate to severe fecal stasis in the ascending colon, moderate fecal stasis in the transverse segment and  uncomplicated scattered left-sided diverticula. Vascular/Lymphatic: Again noted is a 2.8 x 2.3 cm centrally necrotic retrocaval lymph node at the level of the right kidney, again partially effacing the IVC and displacing the right renal artery slightly anteriorly without effacement. No further adenopathy is evident. There is aortic atherosclerosis. Accessory circumaortic left renal vein. Reproductive: Uterus and bilateral adnexa are unremarkable. Other: No free fluid, free air or incarcerated hernias. Musculoskeletal: Levoscoliosis and degenerative change lumbar spine. No bone metastasis is seen. Mild hip DJD. 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 IMPRESSION: 1. 6.1 x 5.4 cm area of heterogeneous hypoenhancement in the posterior mid to lower right kidney with increased appearance of central liquefaction necrosis from 8 days ago. There is again lack of excretion from the involved parenchyma into the calices, and mild urothelial thickening in the adjacent right renal pelvis. Infection with potentially a developing renal abscess and tumor could both have this appearance, with infection favored given that this was not present on 03/05/2023. 2. Further supporting an infectious etiology, there is again noted faint hypoenhancement over portions of the posterior mid left kidney best seen  on the delayed images and likely indicating pyelonephritis. 3. Stable 2.8 x 2.3 cm centrally necrotic retrocaval lymph node at the level of the right kidney, again partially effacing the IVC and displacing the right renal artery slightly anteriorly without effacement. 4. 3 lung nodules in the lower lobes, largest 6 mm in the right lower lobe, concerning for metastases. 5. Constipation and diverticulosis. 6. Aortic atherosclerosis. Aortic Atherosclerosis (ICD10-I70.0). Electronically Signed   By: Almira Bar M.D.   On: 05/09/2023 20:41     ASSESSMENT/PLAN:  This is a very pleasant  72 year old Caucasian female with: 1) initially diagnosed with stage IIIb (T3, N2, M0) non-small cell lung cancer, squamous cell carcinoma.  The patient presented with an obstructive left lingular mass with consolidation and collapse of the lingula as well as part of the left upper lobe and likely invasion of the mediastinum.  She was diagnosed in February 2024.  2) High Grade Urothelial Carcinoma with predominant squamous features (T4) pending further imaging studies. She presented with a 6.1 x 5.4 cm hypoenhancement of the posterior mid/lower right kidney with associated necrotic retrocaval lymph node. Tumor with predominant squamous differentiation diffusely involves renal pelvis and extend through kidney into perirenal fat and renal sinus fat. Large vein invasion is identified. The renal vein soft tissue margin is positive for carcinoma. Diagnosed in December 2024.    Her molecular studies by [facility] showed no actionable mutations and her PD-L1 expression is 5%.   She completed a course of concurrent chemoradiation with carboplatin for AUC of 2 and paclitaxel 45 mg/m.  She status post Avan cycles.  Her last dose was given on 10/06/2022 with a partial response.  She underwent 2 cycles of consolidation immunotherapy with Imfinzi 1500 mg IV every 4 weeks. She was found to have pneumonitis and this was discontinued. She completed high dose steroids.   She had CT of the AP performed in November 2024 showing 6.1 x 5.4 cm hypoenhancement of the posterior mid/lower right kidney with associated necrotic retrocaval lymph node, concerning for urothelial neoplasm.   She underwent open radical nephroureterectomy on 05/26/2023 by Dr. Sande Brothers. She is recovering well. However, her surgical specimen shows high grade urothelial carcinoma with squamous differentiation. Tumor with predominant squamous differentiation diffusely involves renal pelvis and extend through kidney into perirenal fat and renal sinus fat.  Large vein invasion is identified. The renal vein soft tissue margin is positive for carcinoma.   She recently had a restaging CT scan that showed new progressive bilateral pulmonary nodules compabilbe with widespread pulmonary metastases.   Patient was seen with Dr. Arbutus Ped today.  Dr. Arbutus Ped had a lengthy discussion with patient today about her current condition and further workup.  Dr. Arbutus Ped explained this is likely stage IV urothelial carcinoma or stage IV of her non-small cell lung cancer.  She was scheduled to see Dr. Delton Coombes for routine follow-up on 07/09/2023.  I have placed a new referral and have sent a message to their office to see if the patient can be seen sooner.  Dr. Arbutus Ped discussed life expectancy if this is metastatic lung cancer is about 1.5 years.  The patient is highly motivated but did express quality over quantity of life.  She is scheduled to see Dr. Kathrynn Running on 06/23/2023 to discuss adjuvant radiation.  We will arrange for brain MRI and a PET scan to restage her disease.   The patient is motivated to start treatment instead of waiting for the biopsy.  Therefore Dr. Arbutus Ped recommended palliative systemic  chemotherapy with carboplatin on day 1 and gemcitabine 1000 mg on days 1 and 8 IV every 3 weeks.  This would be effective for urothelial carcinoma or non-small cell lung cancer.  However it would be helpful to have the biopsy to discuss alternative treatments in the future should she have progressive disease.  We have also requested molecular studies to be performed via [facility] on the recent tissue from her surgical resection.  The patient is allergic to Compazine.  I sent Zofran to her pharmacy to take every 8 hours as needed for nausea and vomiting starting day 3 after treatment.  If she needs anything additional for nausea, she is advised to call.  We can consider Ativan.  We will increase her Synthroid to 100 micrograms.  We will reach out to radiation oncology  to see if they are able to see the patient sooner per patient request.  We will see her back on day 8 of cycle #1 to review her scan results.  We reached out to [facility] to get a copy of her recent office note from 06/01/23 for our records/review.   The patient was advised to call immediately if she has any concerning symptoms in the interval. The patient voices understanding of current disease status and treatment options and is in agreement with the current care plan. All questions were answered. The patient knows to call the clinic with any problems, questions or concerns. We can certainly see the patient much sooner if necessary  Orders Placed This Encounter  Procedures   MR Brain W Wo Contrast    Standing Status:   Future    Expected Date:   06/11/2023    Expiration Date:   06/07/2024    If indicated for the ordered procedure, I authorize the administration of contrast media per Radiology protocol:   Yes    What is the patient's sedation requirement?:   No Sedation    Does the patient have a pacemaker or implanted devices?:   No    Use SRS Protocol?:   No    Preferred imaging location?:   [facility] (table limit 350lbs)   NM PET Image Restage (PS) Skull Base to Thigh (F-18 FDG)    Standing Status:   Future    Expected Date:   06/12/2023    Expiration Date:   06/07/2024    If indicated for the ordered procedure, I authorize the administration of a radiopharmaceutical per Radiology protocol:   Yes    Preferred imaging location?:   [place]   CBC with Differential (Cancer Center Only)    Standing Status:   Standing    Number of Occurrences:   10    Expiration Date:   06/07/2024   CMP (Cancer Center only)    Standing Status:   Standing    Number of Occurrences:   10    Expiration Date:   06/07/2024   Ambulatory referral to Pulmonology    Referral Priority:   Urgent    Referral Type:   Consultation    Referral Reason:   Specialty Services Required    Requested  Specialty:   Pulmonary Disease    Number of Visits Requested:   1      Zi Sek L Denita Lun, PA-C 06/08/23  ADDENDUM: Hematology/Oncology Attending: I had a face-to-face encounter with the patient today.  I reviewed her records, lab, scan and recommended her care plan.  The patient came to the clinic today accompanied by her  husband and 2 daughters.  She has a history of stage IIIb non-small cell lung cancer, squamous cell carcinoma presented with obstructive left lingular mass in addition to consolidation and collapse of the lingula as well as left upper lobe lesion and invasion of the mediastinum diagnosed in February 2024 status post a course of concurrent chemoradiation followed by 2 cycles of consolidation treatment with immunotherapy with Imfinzi discontinued secondary to toxicity and highly suspicious immunotherapy mediated pneumonitis.  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 Liliane Shi and the tumor extensively involving the renal pelvis and extends through the kidney into the perinephric fat with lymphovascular invasion and positive margin at the [facility] soft tissue of the renal vein. I had a lengthy discussion with the patient and her family today about her current condition and treatment options. I recommended for the patient to complete the staging workup by ordering a PET scan as well as MRI of the brain to rule out any other metastatic disease her most recent scan of the chest showed development of bilateral pulmonary nodules specially in the left lower lobe. The patient will be referred to Dr. Delton Coombes with pulmonary medicine for consideration of repeat bronchoscopy and biopsy of one of the large pulmonary nodules to identify whether these nodules are related to her squamous cell carcinoma of the lung or urothelial carcinoma of  the kidney.  The patient was also referred to Dr. Kathrynn Running for consideration of palliative radiotherapy to the positive resection margin. I also discussed with the patient her treatment options and she understands that she is likely having metastatic disease either from a lung primary or from the urothelial carcinoma with squamous differentiation. I will [facility] block to [facility] molecular studies. I discussed with the patient her treatment options and explained to her that systemic chemotherapy will be the option for treatment of either metastatic non-small cell lung cancer, squamous cell carcinoma or static urothelial carcinoma. I recommended for her a regimen consisting of carboplatin for AUC of 5 on day 1 and gemcitabine 1000 Mg/M2 on days 1 and 8 every 3 weeks.  She was also given the option of palliative care and hospice. The patient is interested in treatment and I discussed with her the adverse effect of this treatment including but not limited to alopecia, myelosuppression, nausea and vomiting, peripheral neuropathy, liver or renal dysfunction.  She is expected to start the first cycle of this treatment next week.  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 She will come back for follow-up visit in around 2 weeks for evaluation and management of any adverse effect of her treatment. The patient was advised to call immediately if she has any other concerning symptoms in the interval. The total time spent in the appointment was 55 minutes. Disclaimer: This note was dictated with voice recognition software. Similar sounding words can inadvertently be transcribed and may be missed upon review. Lajuana Matte, MD

## 2023-06-09 ENCOUNTER — Other Ambulatory Visit: Payer: Medicare Other

## 2023-06-09 ENCOUNTER — Encounter: Payer: Self-pay | Admitting: Emergency Medicine

## 2023-06-09 ENCOUNTER — Other Ambulatory Visit: Payer: Self-pay

## 2023-06-09 NOTE — Telephone Encounter (Signed)
 Case discussed with her and biopsy planned

## 2023-06-11 ENCOUNTER — Telehealth: Payer: Self-pay

## 2023-06-11 NOTE — Telephone Encounter (Signed)
 Patient called and LVM in regards to any recommendations on a holistic NP to work together with Dr. Sherrod for treatment.   Per Dr. Sherrod-  I am not sure if holistic medicine will help in her situation.  It may complicate the adverse effect of the treatment.  We do have the palliative care team that can help her with her symptoms if she has any starting treatment.   Patient verbalized understanding and was told to call with any concerns or questions.

## 2023-06-15 ENCOUNTER — Encounter (HOSPITAL_COMMUNITY)
Admission: RE | Admit: 2023-06-15 | Discharge: 2023-06-15 | Disposition: A | Discharge status: Home / Self Care | Payer: Medicare Other | Source: Ambulatory Visit | Attending: Physician Assistant | Admitting: Physician Assistant

## 2023-06-15 DIAGNOSIS — Z9181 History of falling: Secondary | ICD-10-CM | POA: Insufficient documentation

## 2023-06-15 DIAGNOSIS — C3490 Malignant neoplasm of unspecified part of unspecified bronchus or lung: Secondary | ICD-10-CM | POA: Insufficient documentation

## 2023-06-15 DIAGNOSIS — C651 Malignant neoplasm of right renal pelvis: Secondary | ICD-10-CM | POA: Diagnosis not present

## 2023-06-15 DIAGNOSIS — R590 Localized enlarged lymph nodes: Secondary | ICD-10-CM | POA: Diagnosis not present

## 2023-06-15 DIAGNOSIS — Z905 Acquired absence of kidney: Secondary | ICD-10-CM | POA: Diagnosis not present

## 2023-06-15 DIAGNOSIS — C689 Malignant neoplasm of urinary organ, unspecified: Secondary | ICD-10-CM | POA: Insufficient documentation

## 2023-06-15 DIAGNOSIS — C7951 Secondary malignant neoplasm of bone: Secondary | ICD-10-CM | POA: Diagnosis not present

## 2023-06-15 DIAGNOSIS — R918 Other nonspecific abnormal finding of lung field: Secondary | ICD-10-CM | POA: Insufficient documentation

## 2023-06-15 LAB — GLUCOSE, CAPILLARY: Glucose-Capillary: 95 mg/dL (ref 70–99)

## 2023-06-15 MED ORDER — FLUDEOXYGLUCOSE F - 18 (FDG) INJECTION
5.7000 | Freq: Once | INTRAVENOUS | Status: AC
  Administered 2023-06-15: 5.67 via INTRAVENOUS

## 2023-06-15 MED FILL — Fosaprepitant Dimeglumine For IV Infusion 150 MG (Base Eq): INTRAVENOUS | Qty: 5 | Status: AC

## 2023-06-16 ENCOUNTER — Other Ambulatory Visit: Payer: Self-pay | Admitting: Medical Oncology

## 2023-06-16 ENCOUNTER — Other Ambulatory Visit: Payer: Self-pay | Admitting: Physician Assistant

## 2023-06-16 ENCOUNTER — Inpatient Hospital Stay: Payer: Medicare Other | Admitting: Physician Assistant

## 2023-06-16 ENCOUNTER — Emergency Department (HOSPITAL_COMMUNITY)
Admission: EM | Admit: 2023-06-16 | Discharge: 2023-06-16 | Disposition: A | Discharge status: Home / Self Care | Payer: Medicare Other | Attending: Emergency Medicine | Admitting: Emergency Medicine

## 2023-06-16 ENCOUNTER — Encounter: Payer: Self-pay | Admitting: Internal Medicine

## 2023-06-16 ENCOUNTER — Emergency Department (HOSPITAL_COMMUNITY): Payer: Medicare Other

## 2023-06-16 ENCOUNTER — Ambulatory Visit: Payer: Medicare Other | Admitting: Internal Medicine

## 2023-06-16 ENCOUNTER — Encounter (HOSPITAL_COMMUNITY): Payer: Self-pay

## 2023-06-16 ENCOUNTER — Inpatient Hospital Stay: Payer: Medicare Other

## 2023-06-16 VITALS — BP 131/77 | HR 74 | Resp 18

## 2023-06-16 VITALS — BP 127/68 | HR 89 | Temp 98.1°F | Resp 17 | Wt 117.5 lb

## 2023-06-16 DIAGNOSIS — Z5189 Encounter for other specified aftercare: Secondary | ICD-10-CM | POA: Insufficient documentation

## 2023-06-16 DIAGNOSIS — E039 Hypothyroidism, unspecified: Secondary | ICD-10-CM

## 2023-06-16 DIAGNOSIS — Z905 Acquired absence of kidney: Secondary | ICD-10-CM | POA: Insufficient documentation

## 2023-06-16 DIAGNOSIS — Z95828 Presence of other vascular implants and grafts: Secondary | ICD-10-CM

## 2023-06-16 DIAGNOSIS — C3490 Malignant neoplasm of unspecified part of unspecified bronchus or lung: Secondary | ICD-10-CM | POA: Insufficient documentation

## 2023-06-16 DIAGNOSIS — C679 Malignant neoplasm of bladder, unspecified: Secondary | ICD-10-CM | POA: Insufficient documentation

## 2023-06-16 DIAGNOSIS — C7800 Secondary malignant neoplasm of unspecified lung: Secondary | ICD-10-CM

## 2023-06-16 DIAGNOSIS — C641 Malignant neoplasm of right kidney, except renal pelvis: Secondary | ICD-10-CM | POA: Insufficient documentation

## 2023-06-16 DIAGNOSIS — C649 Malignant neoplasm of unspecified kidney, except renal pelvis: Secondary | ICD-10-CM | POA: Insufficient documentation

## 2023-06-16 DIAGNOSIS — R935 Abnormal findings on diagnostic imaging of other abdominal regions, including retroperitoneum: Secondary | ICD-10-CM | POA: Diagnosis not present

## 2023-06-16 DIAGNOSIS — R1031 Right lower quadrant pain: Secondary | ICD-10-CM | POA: Insufficient documentation

## 2023-06-16 DIAGNOSIS — C7989 Secondary malignant neoplasm of other specified sites: Secondary | ICD-10-CM | POA: Insufficient documentation

## 2023-06-16 DIAGNOSIS — R0789 Other chest pain: Secondary | ICD-10-CM | POA: Diagnosis not present

## 2023-06-16 DIAGNOSIS — Z9889 Other specified postprocedural states: Secondary | ICD-10-CM | POA: Diagnosis not present

## 2023-06-16 DIAGNOSIS — Z923 Personal history of irradiation: Secondary | ICD-10-CM | POA: Insufficient documentation

## 2023-06-16 DIAGNOSIS — T50905A Adverse effect of unspecified drugs, medicaments and biological substances, initial encounter: Secondary | ICD-10-CM

## 2023-06-16 DIAGNOSIS — Z5111 Encounter for antineoplastic chemotherapy: Secondary | ICD-10-CM | POA: Insufficient documentation

## 2023-06-16 DIAGNOSIS — E041 Nontoxic single thyroid nodule: Secondary | ICD-10-CM

## 2023-06-16 DIAGNOSIS — Z9221 Personal history of antineoplastic chemotherapy: Secondary | ICD-10-CM | POA: Insufficient documentation

## 2023-06-16 DIAGNOSIS — C3492 Malignant neoplasm of unspecified part of left bronchus or lung: Secondary | ICD-10-CM

## 2023-06-16 LAB — CBC WITH DIFFERENTIAL (CANCER CENTER ONLY)
Abs Immature Granulocytes: 0.02 10*3/uL (ref 0.00–0.07)
Basophils Absolute: 0.1 10*3/uL (ref 0.0–0.1)
Basophils Relative: 1 %
Eosinophils Absolute: 0.2 10*3/uL (ref 0.0–0.5)
Eosinophils Relative: 4 %
HCT: 29.9 % — ABNORMAL LOW (ref 36.0–46.0)
Hemoglobin: 9.4 g/dL — ABNORMAL LOW (ref 12.0–15.0)
Immature Granulocytes: 0 %
Lymphocytes Relative: 6 %
Lymphs Abs: 0.3 10*3/uL — ABNORMAL LOW (ref 0.7–4.0)
MCH: 29.4 pg (ref 26.0–34.0)
MCHC: 31.4 g/dL (ref 30.0–36.0)
MCV: 93.4 fL (ref 80.0–100.0)
Monocytes Absolute: 0.6 10*3/uL (ref 0.1–1.0)
Monocytes Relative: 12 %
Neutro Abs: 4.1 10*3/uL (ref 1.7–7.7)
Neutrophils Relative %: 77 %
Platelet Count: 282 10*3/uL (ref 150–400)
RBC: 3.2 MIL/uL — ABNORMAL LOW (ref 3.87–5.11)
RDW: 14.5 % (ref 11.5–15.5)
WBC Count: 5.3 10*3/uL (ref 4.0–10.5)
nRBC: 0 % (ref 0.0–0.2)

## 2023-06-16 LAB — CMP (CANCER CENTER ONLY)
ALT: 11 U/L (ref 0–44)
AST: 12 U/L — ABNORMAL LOW (ref 15–41)
Albumin: 3.7 g/dL (ref 3.5–5.0)
Alkaline Phosphatase: 55 U/L (ref 38–126)
Anion gap: 8 (ref 5–15)
BUN: 29 mg/dL — ABNORMAL HIGH (ref 8–23)
CO2: 27 mmol/L (ref 22–32)
Calcium: 9.3 mg/dL (ref 8.9–10.3)
Chloride: 106 mmol/L (ref 98–111)
Creatinine: 1.33 mg/dL — ABNORMAL HIGH (ref 0.44–1.00)
GFR, Estimated: 43 mL/min — ABNORMAL LOW (ref >60)
Glucose, Bld: 178 mg/dL — ABNORMAL HIGH (ref 70–99)
Potassium: 3.8 mmol/L (ref 3.5–5.1)
Sodium: 141 mmol/L (ref 135–145)
Total Bilirubin: 0.3 mg/dL (ref 0.0–1.2)
Total Protein: 6.9 g/dL (ref 6.5–8.1)

## 2023-06-16 LAB — CBC WITH DIFFERENTIAL/PLATELET
Abs Immature Granulocytes: 0.02 10*3/uL (ref 0.00–0.07)
Basophils Absolute: 0 10*3/uL (ref 0.0–0.1)
Basophils Relative: 0 %
Eosinophils Absolute: 0 10*3/uL (ref 0.0–0.5)
Eosinophils Relative: 0 %
HCT: 31 % — ABNORMAL LOW (ref 36.0–46.0)
Hemoglobin: 9.5 g/dL — ABNORMAL LOW (ref 12.0–15.0)
Immature Granulocytes: 0 %
Lymphocytes Relative: 2 %
Lymphs Abs: 0.1 10*3/uL — ABNORMAL LOW (ref 0.7–4.0)
MCH: 29.5 pg (ref 26.0–34.0)
MCHC: 30.6 g/dL (ref 30.0–36.0)
MCV: 96.3 fL (ref 80.0–100.0)
Monocytes Absolute: 0.1 10*3/uL (ref 0.1–1.0)
Monocytes Relative: 1 %
Neutro Abs: 5.9 10*3/uL (ref 1.7–7.7)
Neutrophils Relative %: 97 %
Platelets: 277 10*3/uL (ref 150–400)
RBC: 3.22 MIL/uL — ABNORMAL LOW (ref 3.87–5.11)
RDW: 14.5 % (ref 11.5–15.5)
WBC: 6.2 10*3/uL (ref 4.0–10.5)
nRBC: 0 % (ref 0.0–0.2)

## 2023-06-16 LAB — URINALYSIS, ROUTINE W REFLEX MICROSCOPIC
Bilirubin Urine: NEGATIVE
Glucose, UA: NEGATIVE mg/dL
Hgb urine dipstick: NEGATIVE
Ketones, ur: NEGATIVE mg/dL
Leukocytes,Ua: NEGATIVE
Nitrite: NEGATIVE
Protein, ur: NEGATIVE mg/dL
Specific Gravity, Urine: 1.031 — ABNORMAL HIGH (ref 1.005–1.030)
pH: 6 (ref 5.0–8.0)

## 2023-06-16 LAB — I-STAT CHEM 8, ED
BUN: 23 mg/dL (ref 8–23)
Calcium, Ion: 1.18 mmol/L (ref 1.15–1.40)
Chloride: 106 mmol/L (ref 98–111)
Creatinine, Ser: 1.2 mg/dL — ABNORMAL HIGH (ref 0.44–1.00)
Glucose, Bld: 153 mg/dL — ABNORMAL HIGH (ref 70–99)
HCT: 29 % — ABNORMAL LOW (ref 36.0–46.0)
Hemoglobin: 9.9 g/dL — ABNORMAL LOW (ref 12.0–15.0)
Potassium: 4.3 mmol/L (ref 3.5–5.1)
Sodium: 137 mmol/L (ref 135–145)
TCO2: 22 mmol/L (ref 22–32)

## 2023-06-16 LAB — LIPASE, BLOOD: Lipase: 41 U/L (ref 11–51)

## 2023-06-16 LAB — TSH: TSH: 35.486 u[IU]/mL — ABNORMAL HIGH (ref 0.350–4.500)

## 2023-06-16 MED ORDER — FAMOTIDINE IN NACL 20-0.9 MG/50ML-% IV SOLN
20.0000 mg | Freq: Once | INTRAVENOUS | Status: AC | PRN
  Administered 2023-06-16: 20 mg via INTRAVENOUS

## 2023-06-16 MED ORDER — SODIUM CHLORIDE 0.9% FLUSH
10.0000 mL | INTRAVENOUS | Status: DC | PRN
Start: 2023-06-16 — End: 2023-06-16
  Administered 2023-06-16: 10 mL

## 2023-06-16 MED ORDER — ONDANSETRON HCL 4 MG/2ML IJ SOLN
4.0000 mg | Freq: Once | INTRAMUSCULAR | Status: AC
  Administered 2023-06-16: 4 mg via INTRAVENOUS
  Filled 2023-06-16: qty 2

## 2023-06-16 MED ORDER — SODIUM CHLORIDE 0.9% FLUSH
10.0000 mL | Freq: Once | INTRAVENOUS | Status: AC
  Administered 2023-06-16: 10 mL

## 2023-06-16 MED ORDER — IOHEXOL 300 MG/ML  SOLN
100.0000 mL | Freq: Once | INTRAMUSCULAR | Status: AC | PRN
  Administered 2023-06-16: 100 mL via INTRAVENOUS

## 2023-06-16 MED ORDER — GEMCITABINE HCL CHEMO INJECTION 1 GM/26.3ML
1000.0000 mg/m2 | Freq: Once | INTRAVENOUS | Status: AC
  Administered 2023-06-16: 1521 mg via INTRAVENOUS
  Filled 2023-06-16: qty 40.01

## 2023-06-16 MED ORDER — SODIUM CHLORIDE 0.9 % IV SOLN
277.5000 mg | Freq: Once | INTRAVENOUS | Status: AC
  Administered 2023-06-16: 280 mg via INTRAVENOUS
  Filled 2023-06-16: qty 28

## 2023-06-16 MED ORDER — SODIUM CHLORIDE 0.9 % IV SOLN: INTRAVENOUS | Status: DC

## 2023-06-16 MED ORDER — PALONOSETRON HCL INJECTION 0.25 MG/5ML
0.2500 mg | Freq: Once | INTRAVENOUS | Status: AC
  Administered 2023-06-16: 0.25 mg via INTRAVENOUS
  Filled 2023-06-16: qty 5

## 2023-06-16 MED ORDER — HEPARIN SOD (PORK) LOCK FLUSH 100 UNIT/ML IV SOLN
500.0000 [IU] | Freq: Once | INTRAVENOUS | Status: AC | PRN
Start: 2023-06-16 — End: 2023-06-16
  Administered 2023-06-16: 500 [IU]

## 2023-06-16 MED ORDER — OXYCODONE-ACETAMINOPHEN 5-325 MG PO TABS
1.0000 | ORAL_TABLET | Freq: Once | ORAL | Status: AC
Start: 2023-06-16 — End: 2023-06-16
  Administered 2023-06-16: 1 via ORAL
  Filled 2023-06-16: qty 1

## 2023-06-16 MED ORDER — OXYCODONE-ACETAMINOPHEN 5-325 MG PO TABS: 1.0000 | ORAL_TABLET | ORAL | 0 refills | Status: DC | PRN

## 2023-06-16 MED ORDER — MORPHINE SULFATE (PF) 4 MG/ML IV SOLN
4.0000 mg | Freq: Once | INTRAVENOUS | Status: AC
  Administered 2023-06-16: 4 mg via INTRAVENOUS
  Filled 2023-06-16: qty 1

## 2023-06-16 MED ORDER — SODIUM CHLORIDE 0.9 % IV BOLUS
1000.0000 mL | Freq: Once | INTRAVENOUS | Status: AC
  Administered 2023-06-16: 1000 mL via INTRAVENOUS

## 2023-06-16 MED ORDER — SODIUM CHLORIDE 0.9 % IV SOLN
Freq: Once | INTRAVENOUS | Status: DC | PRN
Start: 2023-06-16 — End: 2023-06-16

## 2023-06-16 MED ORDER — FOSAPREPITANT DIMEGLUMINE INJECTION 150 MG
150.0000 mg | Freq: Once | INTRAVENOUS | Status: AC
  Administered 2023-06-16: 150 mg via INTRAVENOUS
  Filled 2023-06-16: qty 150

## 2023-06-16 MED ORDER — DEXAMETHASONE SODIUM PHOSPHATE 10 MG/ML IJ SOLN
10.0000 mg | Freq: Once | INTRAMUSCULAR | Status: AC
  Administered 2023-06-16: 10 mg via INTRAVENOUS
  Filled 2023-06-16: qty 1

## 2023-06-16 NOTE — Patient Instructions (Signed)
 [facility] - A DEPT OF [facility]  Discharge Instructions: Thank you for choosing [facility] to provide your oncology and hematology care.   If you have a lab appointment with the Cancer Center, please go directly to the Cancer Center and check in at the registration area.   Wear comfortable clothing and clothing appropriate for easy access to any Portacath or PICC line.   We strive to give you quality time with your provider. You may need to reschedule your appointment if you arrive late (15 or more minutes).  Arriving late affects you and other patients whose appointments are after yours.  Also, if you miss three or more appointments without notifying the office, you may be dismissed from the clinic at the provider's discretion.      For prescription refill requests, have your pharmacy contact our office and allow 72 hours for refills to be completed.    Today you received the following chemotherapy and/or immunotherapy agents: Gemcitabine, Carboplatin      To help prevent nausea and vomiting after your treatment, we encourage you to take your nausea medication as directed.  BELOW ARE SYMPTOMS THAT SHOULD BE REPORTED IMMEDIATELY: *FEVER GREATER THAN 100.4 F (38 C) OR HIGHER *CHILLS OR SWEATING *NAUSEA AND VOMITING THAT IS NOT CONTROLLED WITH YOUR NAUSEA MEDICATION *UNUSUAL SHORTNESS OF BREATH *UNUSUAL BRUISING OR BLEEDING *URINARY PROBLEMS (pain or burning when urinating, or frequent urination) *BOWEL PROBLEMS (unusual diarrhea, constipation, pain near the anus) TENDERNESS IN MOUTH AND THROAT WITH OR WITHOUT PRESENCE OF ULCERS (sore throat, sores in mouth, or a toothache) UNUSUAL RASH, SWELLING OR PAIN  UNUSUAL VAGINAL DISCHARGE OR ITCHING   Items with * indicate a potential emergency and should be followed up as soon as possible or go to the Emergency Department if any problems should occur.  Please show the CHEMOTHERAPY ALERT CARD or  IMMUNOTHERAPY ALERT CARD at check-in to the Emergency Department and triage nurse.  Should you have questions after your visit or need to cancel or reschedule your appointment, please contact [facility] - A DEPT [facility]  Dept: [phone]  and follow the prompts.  Office hours are 8:00 a.m. to 4:30 p.m. Monday - Friday. Please note that voicemails left after 4:00 p.m. may not be returned until the following business day.  We are closed weekends and major holidays. You have access to a nurse at all times for urgent questions. Please call the main number to the clinic Dept: [phone] and follow the prompts.   For any non-urgent questions, you may also contact your provider using MyChart. We now offer e-Visits for anyone 13 and older to request care online for non-urgent symptoms. For details visit [email or web address].   Also download the MyChart app! Go to the app store, search "MyChart", open the app, select [place], and log in with your MyChart username and password.  Gemcitabine Injection What is this medication? GEMCITABINE (jem SYE ta been) treats some types of cancer. It works by slowing down the growth of cancer cells. This medicine may be used for other purposes; ask your health care provider or pharmacist if you have questions. COMMON BRAND NAME(S): Gemzar, Infugem What should I tell my care team before I take this medication? They need to know if you have any of these conditions: Blood disorders Infection Kidney disease Liver disease Lung or breathing disease, such as asthma or COPD Recent or  ongoing radiation therapy An unusual or allergic reaction to gemcitabine, other medications, foods, dyes, or preservatives If you or your partner are pregnant or trying to get pregnant Breast-feeding How should I use this medication? This medication is injected into a vein. It is given by your care team in a hospital or clinic setting. Talk  to your care team about the use of this medication in children. Special care may be needed. Overdosage: If you think you have taken too much of this medicine contact a poison control center or emergency room at once. NOTE: This medicine is only for you. Do not share this medicine with others. What if I miss a dose? Keep appointments for follow-up doses. It is important not to miss your dose. Call your care team if you are unable to keep an appointment. What may interact with this medication? Interactions have not been studied. This list may not describe all possible interactions. Give your health care provider a list of all the medicines, herbs, non-prescription drugs, or dietary supplements you use. Also tell them if you smoke, drink alcohol, or use illegal drugs. Some items may interact with your medicine. What should I watch for while using this medication? Your condition will be monitored carefully while you are receiving this medication. This medication may make you feel generally unwell. This is not uncommon, as chemotherapy can affect healthy cells as well as cancer cells. Report any side effects. Continue your course of treatment even though you feel ill unless your care team tells you to stop. In some cases, you may be given additional medications to help with side effects. Follow all directions for their use. This medication may increase your risk of getting an infection. Call your care team for advice if you get a fever, chills, sore throat, or other symptoms of a cold or flu. Do not treat yourself. Try to avoid being around people who are sick. This medication may increase your risk to bruise or bleed. Call your care team if you notice any unusual bleeding. Be careful brushing or flossing your teeth or using a toothpick because you may get an infection or bleed more easily. If you have any dental work done, tell your dentist you are receiving this medication. Avoid taking medications that  contain aspirin, acetaminophen, ibuprofen, naproxen, or ketoprofen unless instructed by your care team. These medications may hide a fever. Talk to your care team if you or your partner wish to become pregnant or think you might be pregnant. This medication can cause serious birth defects if taken during pregnancy and for 6 months after the last dose. A negative pregnancy test is required before starting this medication. A reliable form of contraception is recommended while taking this medication and for 6 months after the last dose. Talk to your care team about effective forms of contraception. Do not father a child while taking this medication and for 3 months after the last dose. Use a condom while having sex during this time period. Do not breastfeed while taking this medication and for at least 1 week after the last dose. This medication may cause infertility. Talk to your care team if you are concerned about your fertility. What side effects may I notice from receiving this medication? Side effects that you should report to your care team as soon as possible: Allergic reactions--skin rash, itching, hives, swelling of the face, lips, tongue, or throat Capillary leak syndrome--stomach or muscle pain, unusual weakness or fatigue, feeling faint or lightheaded, decrease  in the amount of urine, swelling of the ankles, hands, or feet, trouble breathing Infection--fever, chills, cough, sore throat, wounds that don't heal, pain or trouble when passing urine, general feeling of discomfort or being unwell Liver injury--right upper belly pain, loss of appetite, nausea, light-colored stool, dark yellow or brown urine, yellowing skin or eyes, unusual weakness or fatigue Low red blood cell level--unusual weakness or fatigue, dizziness, headache, trouble breathing Lung injury--shortness of breath or trouble breathing, cough, spitting up blood, chest pain, fever Stomach pain, bloody diarrhea, pale skin, unusual  weakness or fatigue, decrease in the amount of urine, which may be signs of hemolytic uremic syndrome Sudden and severe headache, confusion, change in vision, seizures, which may be signs of posterior reversible encephalopathy syndrome (PRES) Unusual bruising or bleeding Side effects that usually do not require medical attention (report to your care team if they continue or are bothersome): Diarrhea Drowsiness Hair loss Nausea Pain, redness, or swelling with sores inside the mouth or throat Vomiting This list may not describe all possible side effects. Call your doctor for medical advice about side effects. You may report side effects to FDA at 1-800-FDA-1088. Where should I keep my medication? This medication is given in a hospital or clinic. It will not be stored at home. NOTE: This sheet is a summary. It may not cover all possible information. If you have questions about this medicine, talk to your doctor, pharmacist, or health care provider.  2024 Elsevier/Gold Standard (2021-10-01 00:00:00)  Carboplatin Injection What is this medication? CARBOPLATIN (KAR boe pla tin) treats some types of cancer. It works by slowing down the growth of cancer cells. This medicine may be used for other purposes; ask your health care provider or pharmacist if you have questions. COMMON BRAND NAME(S): Paraplatin What should I tell my care team before I take this medication? They need to know if you have any of these conditions: Blood disorders Hearing problems Kidney disease Recent or ongoing radiation therapy An unusual or allergic reaction to carboplatin, cisplatin, other medications, foods, dyes, or preservatives Pregnant or trying to get pregnant Breast-feeding How should I use this medication? This medication is injected into a vein. It is given by your care team in a hospital or clinic setting. Talk to your care team about the use of this medication in children. Special care may be  needed. Overdosage: If you think you have taken too much of this medicine contact a poison control center or emergency room at once. NOTE: This medicine is only for you. Do not share this medicine with others. What if I miss a dose? Keep appointments for follow-up doses. It is important not to miss your dose. Call your care team if you are unable to keep an appointment. What may interact with this medication? Medications for seizures Some antibiotics, such as amikacin, gentamicin, neomycin, streptomycin, tobramycin Vaccines This list may not describe all possible interactions. Give your health care provider a list of all the medicines, herbs, non-prescription drugs, or dietary supplements you use. Also tell them if you smoke, drink alcohol, or use illegal drugs. Some items may interact with your medicine. What should I watch for while using this medication? Your condition will be monitored carefully while you are receiving this medication. You may need blood work while taking this medication. This medication may make you feel generally unwell. This is not uncommon, as chemotherapy can affect healthy cells as well as cancer cells. Report any side effects. Continue your course of treatment even  though you feel ill unless your care team tells you to stop. In some cases, you may be given additional medications to help with side effects. Follow all directions for their use. This medication may increase your risk of getting an infection. Call your care team for advice if you get a fever, chills, sore throat, or other symptoms of a cold or flu. Do not treat yourself. Try to avoid being around people who are sick. Avoid taking medications that contain aspirin, acetaminophen, ibuprofen, naproxen, or ketoprofen unless instructed by your care team. These medications may hide a fever. Be careful brushing or flossing your teeth or using a toothpick because you may get an infection or bleed more easily. If you  have any dental work done, tell your dentist you are receiving this medication. Talk to your care team if you wish to become pregnant or think you might be pregnant. This medication can cause serious birth defects. Talk to your care team about effective forms of contraception. Do not breast-feed while taking this medication. What side effects may I notice from receiving this medication? Side effects that you should report to your care team as soon as possible: Allergic reactions--skin rash, itching, hives, swelling of the face, lips, tongue, or throat Infection--fever, chills, cough, sore throat, wounds that don't heal, pain or trouble when passing urine, general feeling of discomfort or being unwell Low red blood cell level--unusual weakness or fatigue, dizziness, headache, trouble breathing Pain, tingling, or numbness in the hands or feet, muscle weakness, change in vision, confusion or trouble speaking, loss of balance or coordination, trouble walking, seizures Unusual bruising or bleeding Side effects that usually do not require medical attention (report to your care team if they continue or are bothersome): Hair loss Nausea Unusual weakness or fatigue Vomiting This list may not describe all possible side effects. Call your doctor for medical advice about side effects. You may report side effects to FDA at 1-800-FDA-1088. Where should I keep my medication? This medication is given in a hospital or clinic. It will not be stored at home. NOTE: This sheet is a summary. It may not cover all possible information. If you have questions about this medicine, talk to your doctor, pharmacist, or health care provider.  2024 Elsevier/Gold Standard (2021-09-17 00:00:00)

## 2023-06-16 NOTE — Progress Notes (Signed)
 Hypersensitivity Reaction note  Date of event: 06/16/23 Time of event: 0931 Generic name of drug involved: Fosaprepitant  (Emend) Name of provider notified of the hypersensitivity reaction: Mallie Combes, PA-C Was agent that likely caused hypersensitivity reaction added to Allergies List within EMR? Yes Chain of events including reaction signs/symptoms, treatment administered, and outcome (e.g., drug resumed; drug discontinued; sent to Emergency Department; etc.)  0932: Patient reported that she could feel medication spreading throughout chest.  Medication paused, PA notified via phone.  Pt reported symptom was mild, denied worsening.  PA advised restarting medication; PA en route.  Patient immediately reported worsening of symptom; described pressure in chest.  Medication paused, IVF started to gravity.  0935: PA at chairside.  Pepcid  IV administered (see MAR for details).  Patient endorsed worsening of symptoms, reporting 9/10 chest tightness.  0940: Patient reported easing of symptoms.  See Flowsheets for VS.  [id number]: Patient reported symptoms abated.  See Flowsheets for VS.  Per PA, Emend removed from Tx plan and added to Allergy list; OK to proceed with D1, C1 Gemcitabine /Carboplatin .  Evelyn DELENA Converse, RN 06/16/2023 10:18 AM

## 2023-06-16 NOTE — ED Provider Notes (Signed)
 [place] EMERGENCY DEPARTMENT AT [facility] Provider Note   CSN: [id number] Arrival date & time: 06/16/23  1438     History  Chief Complaint  Patient presents with   Groin Pain    Evelyn Jordan is a 73 y.o. female here with abdominal pain.  Patient had recent right nephrectomy by Dr. Devere.  Patient states that since this morning she has been having right lower quadrant pain.  She just started chemotherapy this week.  Patient states that she took several doses for the hydrocodone  with minimal relief.  She called Dr. Carolynn office and was sent in for a CT abdomen pelvis to further assess.  Patient states that she still has her appendix.  The history is provided by the patient.       Home Medications Prior to Admission medications   Medication Sig Start Date End Date Taking? Authorizing Provider  acetaminophen  (TYLENOL ) 325 MG tablet Take 2 tablets (650 mg total) by mouth every 6 (six) hours as needed for mild pain (pain score 1-3) (or Fever >/= 101). 05/11/23   Will Almarie MATSU, MD  albuterol  (VENTOLIN  HFA) 108 [phone] Base) MCG/ACT inhaler Inhale 2 puffs into the lungs every 6 (six) hours as needed for wheezing or shortness of breath. 08/28/22   Byrum, Lamar RAMAN, MD  bisacodyl  (DULCOLAX) 5 MG EC tablet Take 10-15 mg by mouth daily as needed for moderate constipation.    [provider]  ciprofloxacin  (CIPRO ) 500 MG tablet Take 500 mg by mouth 2 (two) times daily.    [provider]  gabapentin  (NEURONTIN ) 300 MG capsule Take 300 mg by mouth 2 (two) times daily.    [provider]  HYDROcodone -acetaminophen  (NORCO/VICODIN) 5-325 MG tablet Take 1-2 tablets by mouth every 4 (four) hours as needed for moderate pain (pain score 4-6). 05/26/23   Cory Palma, PA-C  levothyroxine  (SYNTHROID ) 100 MCG tablet Take 1 tablet (100 mcg total) by mouth daily at 6 (six) AM. 06/08/23   Heilingoetter, Cassandra L, PA-C  ondansetron  (ZOFRAN ) 8 MG tablet Take 1  tablet (8 mg total) by mouth every 8 (eight) hours as needed for nausea or vomiting. Start after day 3 of chemotherapy if needed 06/08/23   Heilingoetter, Cassandra L, PA-C  oxybutynin  (DITROPAN ) 5 MG tablet Take 1 tablet (5 mg total) by mouth every 8 (eight) hours as needed for bladder spasms. 05/15/23   Devere Lonni Righter, MD  phenazopyridine  (PYRIDIUM ) 200 MG tablet Take 1 tablet (200 mg total) by mouth 3 (three) times daily as needed (for pain with urination). 05/15/23 05/14/24  Devere Lonni Righter, MD      Allergies    Emend [fosaprepitant  dimeglumine] and Compazine [prochlorperazine edisylate]    Review of Systems   Review of Systems  Gastrointestinal:  Positive for abdominal pain.  All other systems reviewed and are negative.   Physical Exam Updated Vital Signs BP (!) 169/95 (BP Location: Left Arm)   Pulse 87   Temp 97.8 F (36.6 C) (Oral)   Resp 16   SpO2 100%  Physical Exam Vitals and nursing note reviewed.  Constitutional:      Comments: Uncomfortable, chronically ill   HENT:     Head: Normocephalic.     Nose: Nose normal.     Mouth/Throat:     Mouth: Mucous membranes are moist.  Eyes:     Extraocular Movements: Extraocular movements intact.     Pupils: Pupils are equal, round, and reactive to light.  Cardiovascular:  Rate and Rhythm: Normal rate and regular rhythm.     Pulses: Normal pulses.     Heart sounds: Normal heart sounds.  Pulmonary:     Effort: Pulmonary effort is normal.     Breath sounds: Normal breath sounds.  Abdominal:     General: Abdomen is flat.     Palpations: Abdomen is soft.     Comments: + RLQ tenderness, ?  Small right inguinal hernia that is easily reducible  Musculoskeletal:     Cervical back: Normal range of motion and neck supple.  Neurological:     General: No focal deficit present.     Mental Status: Mental status is at baseline.  Psychiatric:        Mood and Affect: Mood normal.     ED Results / Procedures /  Treatments   Labs (all labs ordered are listed, but only abnormal results are displayed) Labs Reviewed  CBC WITH DIFFERENTIAL/PLATELET - Abnormal; Notable for the following components:      Result Value   RBC 3.22 (*)    Hemoglobin 9.5 (*)    HCT 31.0 (*)    Lymphs Abs 0.1 (*)    All other components within normal limits  I-STAT CHEM 8, ED - Abnormal; Notable for the following components:   Creatinine, Ser 1.20 (*)    Glucose, Bld 153 (*)    Hemoglobin 9.9 (*)    HCT 29.0 (*)    All other components within normal limits  LIPASE, BLOOD  URINALYSIS, ROUTINE W REFLEX MICROSCOPIC    EKG None  Radiology No results found.  Procedures Procedures    Medications Ordered in ED Medications  oxyCODONE -acetaminophen  (PERCOCET/ROXICET) 5-325 MG per tablet 1 tablet (1 tablet Oral Given 06/16/23 1456)  sodium chloride  0.9 % bolus 1,000 mL (1,000 mLs Intravenous New Bag/Given 06/16/23 1559)  morphine  (PF) 4 MG/ML injection 4 mg (4 mg Intravenous Given 06/16/23 1558)  ondansetron  (ZOFRAN ) injection 4 mg (4 mg Intravenous Given 06/16/23 1558)  iohexol  (OMNIPAQUE ) 300 MG/ML solution 100 mL (100 mLs Intravenous Contrast Given 06/16/23 1607)    ED Course/ Medical Decision Making/ A&P                                 Medical Decision Making Evelyn Jordan is a 73 y.o. female here presenting with right lower quadrant pain.  Patient has acute onset of right lower quadrant pain.  She recently had right nephrectomy.  Consider seroma versus abscess versus femoral hernia versus appendicitis.  Plan to get CBC and CMP and CT abdomen pelvis  5:45 PM I reviewed patient's labs and white blood cell count is normal.  Urinalysis did not show UTI.  Unfortunately her CT scan showed worsening metastatic disease throughout her abdomen.  Patient's pain is under control now.  She is on hydrocodone  at home.  I messaged her oncologist, Dr. Cloretta, who will let Dr. Gatha know.  Will change her prescription to oxycodone   and they will follow-up with her this week  Problems Addressed: Renal cell carcinoma, unspecified laterality (HCC): chronic illness or injury with exacerbation, progression, or side effects of treatment  Amount and/or Complexity of Data Reviewed Labs: ordered. Decision-making details documented in ED Course. Radiology: ordered and independent interpretation performed. Decision-making details documented in ED Course.  Risk Prescription drug management.    Final Clinical Impression(s) / ED Diagnoses Final diagnoses:  None    Rx / DC Orders  ED Discharge Orders     None         Patt Alm Macho, MD 06/16/23 [phone]

## 2023-06-16 NOTE — Discharge Instructions (Signed)
 As we discussed, you have kidney cancer with increasing mets.  This is what is causing your pain  I have switched you from hydrocodone  to oxycodone  5 mg every 4 hours as needed.  You may take 2 tablets if you have severe pain  I have left a message with your oncologist and they will contact you tomorrow regarding follow-up later this week  Return to ER if you have severe abdominal pain or vomiting

## 2023-06-16 NOTE — ED Triage Notes (Addendum)
 Pt presents with c/o right groin pain since 12/14, worse this afternoon. Pt reports she had a right nephrectomy on 12/14. Pt is a cancer patient, had chemo this morning.

## 2023-06-16 NOTE — ED Provider Triage Note (Signed)
 Emergency Medicine Provider Triage Evaluation Note  Evelyn Jordan , a 73 y.o. female  was evaluated in triage.  Pt complains of right lower quad/groin pain for the past few weeks.  Patient did have recent nephrectomy on that side due to her kidney cancer and states that she did have chemo earlier this morning.  Patient's pain is a 10 out of 10 and the Norco at home is not helping.  Patient talked to Dr. Carolynn and was referred here for further imaging and labs.  Patient did have CBC and CMP done earlier today.  Patient denies fevers, nauseous vomiting.  Patient states she still to urinate without dysuria or hematuria.  Review of Systems  Positive:  Negative:   Physical Exam  BP (!) 169/95 (BP Location: Left Arm)   Pulse 87   Temp 97.8 F (36.6 C) (Oral)   Resp 16   SpO2 100%  Gen:   Awake, no distress   Resp:  Normal effort  MSK:   Moves extremities without difficulty  Other:  Generalized tenderness with guarding the right lower quadrant  Medical Decision Making  Medically screening exam initiated at 2:51 PM.  Appropriate orders placed.  Evelyn Jordan was informed that the remainder of the evaluation will be completed by another provider, this initial triage assessment does not replace that evaluation, and the importance of remaining in the ED until their evaluation is complete.  Workup started, patient already had CBC and CMP done earlier this morning at the cancer center and so will not reorder those but will order the rest of abdominal labs, patient given pain medications, CT ordered, patient still has appendix.  Patient stable at this time.   Victor Lynwood DASEN, PA-C 06/16/23 1452

## 2023-06-16 NOTE — Progress Notes (Signed)
    DATE:  06/16/23                                        X MEDICATION REACTION           MD: Sherrod      AGENT/BLOOD PRODUCT RECEIVING IMMEDIATELY PRIOR TO REACTION:          Emend    Vitals:   06/16/23 0940 06/16/23 0943  BP: 135/66 131/77  Pulse: 78 74  Resp: 17 18  SpO2: 99% 99%      REACTION(S):           chest pain   PREMEDS:     Decadron  10 mg IV, I Aloxi  0.25 mg IV   INTERVENTION: Pepcid  20 mg IV   Review of Systems  Review of Systems  Cardiovascular:  Positive for chest pain.  All other systems reviewed and are negative.    Physical Exam  Physical Exam Vitals and nursing note reviewed.  Constitutional:      Appearance: She is not ill-appearing or toxic-appearing.  HENT:     Head: Normocephalic.  Eyes:     Conjunctiva/sclera: Conjunctivae normal.  Cardiovascular:     Rate and Rhythm: Normal rate and regular rhythm.     Pulses: Normal pulses.     Heart sounds: Normal heart sounds.  Pulmonary:     Effort: Pulmonary effort is normal.     Breath sounds: Normal breath sounds.  Abdominal:     General: There is no distension.  Musculoskeletal:     Cervical back: Normal range of motion.  Skin:    General: Skin is warm and dry.  Neurological:     Mental Status: She is alert.     OUTCOME:           Patient became symptomatic approximately 8 minutes into Emend infusion. Emergency medications were administered as documented above. Patient returned to baseline. Oncologist notified and agrees to resume treatment. Patient tolerated remainder of treatment.  I have spent a total of 10 minutes minutes of face-to-face and non-face-to-face time preparing to see the patient, performing a medically appropriate examination, counseling and educating the patient, ordering tests/procedures/medications, documenting clinical information in the electronic health record, and care coordination.

## 2023-06-17 ENCOUNTER — Telehealth: Payer: Self-pay

## 2023-06-17 ENCOUNTER — Ambulatory Visit (HOSPITAL_BASED_OUTPATIENT_CLINIC_OR_DEPARTMENT_OTHER)
Admission: RE | Admit: 2023-06-17 | Discharge: 2023-06-17 | Disposition: A | Discharge status: Home / Self Care | Payer: Medicare Other | Source: Ambulatory Visit | Attending: Physician Assistant | Admitting: Physician Assistant

## 2023-06-17 ENCOUNTER — Encounter: Payer: Self-pay | Admitting: Gastroenterology

## 2023-06-17 ENCOUNTER — Telehealth: Payer: Self-pay | Admitting: Medical Oncology

## 2023-06-17 DIAGNOSIS — C3490 Malignant neoplasm of unspecified part of unspecified bronchus or lung: Secondary | ICD-10-CM | POA: Diagnosis not present

## 2023-06-17 DIAGNOSIS — C689 Malignant neoplasm of urinary organ, unspecified: Secondary | ICD-10-CM | POA: Insufficient documentation

## 2023-06-17 DIAGNOSIS — C799 Secondary malignant neoplasm of unspecified site: Secondary | ICD-10-CM | POA: Diagnosis not present

## 2023-06-17 LAB — T4: T4, Total: 6.7 ug/dL (ref 4.5–12.0)

## 2023-06-17 MED ORDER — GADOBUTROL 1 MMOL/ML IV SOLN
6.0000 mL | Freq: Once | INTRAVENOUS | Status: AC | PRN
  Administered 2023-06-17: 6 mL via INTRAVENOUS

## 2023-06-17 NOTE — Telephone Encounter (Signed)
-----   Message from Nurse Durene Cal B sent at 06/16/2023 12:14 PM EST ----- Regarding: Dr. Arbutus Ped - first time [facility] f/u call - tolerated well Pt did have allergic reaction to Emend just FYI

## 2023-06-17 NOTE — Telephone Encounter (Signed)
 Evelyn Jordan  states that she is doing fine. She is eating, drinking, and urinating well. She knows to call the office at [phone] if  she has any questions or concerns.

## 2023-06-17 NOTE — Telephone Encounter (Signed)
 Reviewed upcoming appts. She confirmed : 01/13-bronchoscopy, afternoon appts at cancer center.   She will need a refill of percocet before the weekend.

## 2023-06-18 ENCOUNTER — Encounter (HOSPITAL_COMMUNITY): Payer: Self-pay | Admitting: Emergency Medicine

## 2023-06-18 ENCOUNTER — Other Ambulatory Visit: Payer: Self-pay | Admitting: Physician Assistant

## 2023-06-18 ENCOUNTER — Encounter: Payer: Self-pay | Admitting: Internal Medicine

## 2023-06-18 ENCOUNTER — Other Ambulatory Visit: Payer: Self-pay

## 2023-06-18 DIAGNOSIS — G893 Neoplasm related pain (acute) (chronic): Secondary | ICD-10-CM

## 2023-06-18 MED ORDER — OXYCODONE-ACETAMINOPHEN 5-325 MG PO TABS: 1.0000 | ORAL_TABLET | ORAL | 0 refills | Status: DC | PRN

## 2023-06-18 NOTE — Progress Notes (Addendum)
 PCP - [name], MD  Cardiologist -   PPM/ICD - denies Device Orders - n/a Rep Notified - n/a  Chest x-ray -  CHEST CT 06-06-23 PFT-04-30-23 EKG - 01-29-23 Stress Test -  ECHO - 03-05-23 Cardiac Cath -   CPAP - denies  DM - denies  Blood Thinner Instructions: denies Aspirin Instructions: n/a  ERAS Protcol - npo Per Dr. Shelah instructions  COVID TEST- n/a  Anesthesia review: yes, Hx of renal ca recent right nephrectomy. Started Chemo therapy Monday January 6, and this recent Monday January 10. Recent ED visit for right sided pain  Patient verbally denies any shortness of breath, fever, cough and chest pain during phone call   -------------  SDW INSTRUCTIONS given:  Your procedure is scheduled on June 22, 2023.  Report to [facility] Main Entrance A at 6:30 A.M., and check in at the Admitting office.  Call this number if you have problems the morning of surgery:  [phone]   Remember:  Do not eat or drink after midnight the night before your surgery      Take these medicines the morning of surgery with A SIP OF WATER   gabapentin  (NEURONTIN )  levothyroxine  (SYNTHROID )     IF NEEDED acetaminophen  (TYLENOL )  ALPRAZolam  (XANAX )  bisacodyl  (DULCOLAX)  HYDROcodone  bit-homatropine (HYDROMET)  ondansetron  (ZOFRAN )  oxyCODONE -acetaminophen  (PERCOCET)    As of today, STOP taking any Aspirin (unless otherwise instructed by your surgeon) Aleve, Naproxen, Ibuprofen , Motrin , Advil , Goody's, BC's, all herbal medications, fish oil, and all vitamins.                      Do not wear jewelry, make up, or nail polish            Do not wear lotions, powders, perfumes/colognes, or deodorant.            Do not shave 48 hours prior to surgery.  Men may shave face and neck.            Do not bring valuables to the hospital.            [facility] is not responsible for any belongings or valuables.  Do NOT Smoke (Tobacco/Vaping) 24 hours prior to your procedure If  you use a CPAP at night, you may bring all equipment for your overnight stay.   Contacts, glasses, dentures or bridgework may not be worn into surgery.      For patients admitted to the hospital, discharge time will be determined by your treatment team.   Patients discharged the day of surgery will not be allowed to drive home, and someone needs to stay with them for 24 hours.    Special instructions:   [facility]- Preparing For Surgery  Before surgery, you can play an important role. Because skin is not sterile, your skin needs to be as free of germs as possible. You can reduce the number of germs on your skin by washing with CHG (chlorahexidine gluconate) Soap before surgery.  CHG is an antiseptic cleaner which kills germs and bonds with the skin to continue killing germs even after washing.    Oral Hygiene is also important to reduce your risk of infection.  Remember - BRUSH YOUR TEETH THE MORNING OF SURGERY WITH YOUR REGULAR TOOTHPASTE  Please do not use if you have an allergy to CHG or antibacterial soaps. If your skin becomes reddened/irritated stop using the CHG.  Do not shave (including legs and underarms) for  at least 48 hours prior to first CHG shower. It is OK to shave your face.  Please follow these instructions carefully.   Shower the NIGHT BEFORE SURGERY and the MORNING OF SURGERY with DIAL Soap.   Pat yourself dry with a CLEAN TOWEL.  Wear CLEAN PAJAMAS to bed the night before surgery  Place CLEAN SHEETS on your bed the night of your first shower and DO NOT SLEEP WITH PETS.   Day of Surgery: Please shower morning of surgery  Wear Clean/Comfortable clothing the morning of surgery Do not apply any deodorants/lotions.   Remember to brush your teeth WITH YOUR REGULAR TOOTHPASTE.   Questions were answered. Patient verbalized understanding of instructions.

## 2023-06-22 ENCOUNTER — Ambulatory Visit (HOSPITAL_COMMUNITY): Payer: Medicare Other

## 2023-06-22 ENCOUNTER — Inpatient Hospital Stay: Payer: Medicare Other

## 2023-06-22 ENCOUNTER — Other Ambulatory Visit: Payer: Self-pay

## 2023-06-22 ENCOUNTER — Ambulatory Visit (HOSPITAL_COMMUNITY): Payer: Self-pay | Admitting: Physician Assistant

## 2023-06-22 ENCOUNTER — Encounter (HOSPITAL_COMMUNITY): Payer: Self-pay | Admitting: Emergency Medicine

## 2023-06-22 ENCOUNTER — Inpatient Hospital Stay (HOSPITAL_BASED_OUTPATIENT_CLINIC_OR_DEPARTMENT_OTHER): Payer: Medicare Other | Admitting: Internal Medicine

## 2023-06-22 ENCOUNTER — Ambulatory Visit (HOSPITAL_COMMUNITY)
Admission: RE | Admit: 2023-06-22 | Discharge: 2023-06-22 | Disposition: A | Discharge status: Home / Self Care | Payer: Medicare Other | Attending: Emergency Medicine | Admitting: Emergency Medicine

## 2023-06-22 ENCOUNTER — Encounter (HOSPITAL_COMMUNITY)
Admission: RE | Disposition: A | Discharge status: Home / Self Care | Payer: Self-pay | Source: Home / Self Care | Attending: Emergency Medicine

## 2023-06-22 ENCOUNTER — Ambulatory Visit (HOSPITAL_BASED_OUTPATIENT_CLINIC_OR_DEPARTMENT_OTHER): Payer: Medicare Other | Admitting: Physician Assistant

## 2023-06-22 ENCOUNTER — Telehealth: Payer: Self-pay | Admitting: *Deleted

## 2023-06-22 ENCOUNTER — Encounter: Payer: Self-pay | Admitting: Internal Medicine

## 2023-06-22 VITALS — BP 124/78 | HR 93 | Temp 97.3°F | Resp 16 | Ht 63.0 in | Wt 116.8 lb

## 2023-06-22 DIAGNOSIS — Z5189 Encounter for other specified aftercare: Secondary | ICD-10-CM | POA: Diagnosis not present

## 2023-06-22 DIAGNOSIS — D649 Anemia, unspecified: Secondary | ICD-10-CM | POA: Diagnosis not present

## 2023-06-22 DIAGNOSIS — R918 Other nonspecific abnormal finding of lung field: Secondary | ICD-10-CM

## 2023-06-22 DIAGNOSIS — C679 Malignant neoplasm of bladder, unspecified: Secondary | ICD-10-CM

## 2023-06-22 DIAGNOSIS — Z510 Encounter for antineoplastic radiation therapy: Secondary | ICD-10-CM | POA: Insufficient documentation

## 2023-06-22 DIAGNOSIS — E039 Hypothyroidism, unspecified: Secondary | ICD-10-CM | POA: Diagnosis not present

## 2023-06-22 DIAGNOSIS — R911 Solitary pulmonary nodule: Secondary | ICD-10-CM | POA: Insufficient documentation

## 2023-06-22 DIAGNOSIS — Z87891 Personal history of nicotine dependence: Secondary | ICD-10-CM | POA: Insufficient documentation

## 2023-06-22 DIAGNOSIS — Z9221 Personal history of antineoplastic chemotherapy: Secondary | ICD-10-CM | POA: Diagnosis not present

## 2023-06-22 DIAGNOSIS — Z48813 Encounter for surgical aftercare following surgery on the respiratory system: Secondary | ICD-10-CM | POA: Diagnosis not present

## 2023-06-22 DIAGNOSIS — Z85528 Personal history of other malignant neoplasm of kidney: Secondary | ICD-10-CM | POA: Insufficient documentation

## 2023-06-22 DIAGNOSIS — Z923 Personal history of irradiation: Secondary | ICD-10-CM | POA: Diagnosis not present

## 2023-06-22 DIAGNOSIS — D709 Neutropenia, unspecified: Secondary | ICD-10-CM | POA: Insufficient documentation

## 2023-06-22 DIAGNOSIS — C3412 Malignant neoplasm of upper lobe, left bronchus or lung: Secondary | ICD-10-CM | POA: Diagnosis not present

## 2023-06-22 DIAGNOSIS — Z905 Acquired absence of kidney: Secondary | ICD-10-CM | POA: Diagnosis not present

## 2023-06-22 DIAGNOSIS — C7951 Secondary malignant neoplasm of bone: Secondary | ICD-10-CM | POA: Diagnosis not present

## 2023-06-22 DIAGNOSIS — Z5111 Encounter for antineoplastic chemotherapy: Secondary | ICD-10-CM | POA: Insufficient documentation

## 2023-06-22 DIAGNOSIS — C3490 Malignant neoplasm of unspecified part of unspecified bronchus or lung: Secondary | ICD-10-CM | POA: Diagnosis not present

## 2023-06-22 DIAGNOSIS — C7800 Secondary malignant neoplasm of unspecified lung: Secondary | ICD-10-CM | POA: Diagnosis not present

## 2023-06-22 DIAGNOSIS — Z95828 Presence of other vascular implants and grafts: Secondary | ICD-10-CM

## 2023-06-22 DIAGNOSIS — F32A Depression, unspecified: Secondary | ICD-10-CM | POA: Diagnosis not present

## 2023-06-22 HISTORY — PX: BRONCHIAL WASHINGS: SHX5105

## 2023-06-22 HISTORY — PX: BRONCHIAL BRUSHINGS: SHX5108

## 2023-06-22 HISTORY — PX: BRONCHIAL BIOPSY: SHX5109

## 2023-06-22 HISTORY — PX: BRONCHIAL NEEDLE ASPIRATION BIOPSY: SHX5106

## 2023-06-22 LAB — CBC WITH DIFFERENTIAL (CANCER CENTER ONLY)
Abs Immature Granulocytes: 0.01 10*3/uL (ref 0.00–0.07)
Basophils Absolute: 0 10*3/uL (ref 0.0–0.1)
Basophils Relative: 1 %
Eosinophils Absolute: 0 10*3/uL (ref 0.0–0.5)
Eosinophils Relative: 0 %
HCT: 28.7 % — ABNORMAL LOW (ref 36.0–46.0)
Hemoglobin: 9.3 g/dL — ABNORMAL LOW (ref 12.0–15.0)
Immature Granulocytes: 0 %
Lymphocytes Relative: 6 %
Lymphs Abs: 0.1 10*3/uL — ABNORMAL LOW (ref 0.7–4.0)
MCH: 29.6 pg (ref 26.0–34.0)
MCHC: 32.4 g/dL (ref 30.0–36.0)
MCV: 91.4 fL (ref 80.0–100.0)
Monocytes Absolute: 0 10*3/uL — ABNORMAL LOW (ref 0.1–1.0)
Monocytes Relative: 1 %
Neutro Abs: 2.3 10*3/uL (ref 1.7–7.7)
Neutrophils Relative %: 92 %
Platelet Count: 167 10*3/uL (ref 150–400)
RBC: 3.14 MIL/uL — ABNORMAL LOW (ref 3.87–5.11)
RDW: 14 % (ref 11.5–15.5)
WBC Count: 2.5 10*3/uL — ABNORMAL LOW (ref 4.0–10.5)
nRBC: 0 % (ref 0.0–0.2)

## 2023-06-22 LAB — CMP (CANCER CENTER ONLY)
ALT: 23 U/L (ref 0–44)
AST: 20 U/L (ref 15–41)
Albumin: 3.9 g/dL (ref 3.5–5.0)
Alkaline Phosphatase: 68 U/L (ref 38–126)
Anion gap: 8 (ref 5–15)
BUN: 25 mg/dL — ABNORMAL HIGH (ref 8–23)
CO2: 27 mmol/L (ref 22–32)
Calcium: 9.2 mg/dL (ref 8.9–10.3)
Chloride: 101 mmol/L (ref 98–111)
Creatinine: 1.32 mg/dL — ABNORMAL HIGH (ref 0.44–1.00)
GFR, Estimated: 43 mL/min — ABNORMAL LOW (ref >60)
Glucose, Bld: 163 mg/dL — ABNORMAL HIGH (ref 70–99)
Potassium: 4.1 mmol/L (ref 3.5–5.1)
Sodium: 136 mmol/L (ref 135–145)
Total Bilirubin: 0.3 mg/dL (ref 0.0–1.2)
Total Protein: 7.3 g/dL (ref 6.5–8.1)

## 2023-06-22 SURGERY — BRONCHOSCOPY, WITH BIOPSY USING ELECTROMAGNETIC NAVIGATION
Anesthesia: General

## 2023-06-22 MED ORDER — GEMCITABINE HCL CHEMO INJECTION 1 GM/26.3ML
1000.0000 mg/m2 | Freq: Once | INTRAVENOUS | Status: AC
  Administered 2023-06-22: 1521 mg via INTRAVENOUS
  Filled 2023-06-22: qty 40.01

## 2023-06-22 MED ORDER — FENTANYL CITRATE (PF) 100 MCG/2ML IJ SOLN
INTRAMUSCULAR | Status: DC | PRN
  Administered 2023-06-22: 50 ug via INTRAVENOUS

## 2023-06-22 MED ORDER — PROPOFOL 10 MG/ML IV BOLUS
INTRAVENOUS | Status: DC | PRN
  Administered 2023-06-22: 120 mg via INTRAVENOUS

## 2023-06-22 MED ORDER — CYCLOBENZAPRINE HCL 5 MG PO TABS: 5.0000 mg | ORAL_TABLET | Freq: Three times a day (TID) | ORAL | 0 refills | Status: DC | PRN

## 2023-06-22 MED ORDER — CIPROFLOXACIN HCL 500 MG PO TABS: 500.0000 mg | ORAL_TABLET | Freq: Two times a day (BID) | ORAL | 0 refills | Status: DC

## 2023-06-22 MED ORDER — OXYCODONE HCL 5 MG PO TABS: 5.0000 mg | ORAL_TABLET | Freq: Once | ORAL | Status: DC | PRN

## 2023-06-22 MED ORDER — FENTANYL CITRATE (PF) 100 MCG/2ML IJ SOLN
INTRAMUSCULAR | Status: AC
  Filled 2023-06-22: qty 2

## 2023-06-22 MED ORDER — DEXAMETHASONE SODIUM PHOSPHATE 10 MG/ML IJ SOLN
INTRAMUSCULAR | Status: DC | PRN
  Administered 2023-06-22: 10 mg via INTRAVENOUS

## 2023-06-22 MED ORDER — MIDAZOLAM HCL 2 MG/2ML IJ SOLN
INTRAMUSCULAR | Status: AC
  Filled 2023-06-22: qty 2

## 2023-06-22 MED ORDER — SUGAMMADEX SODIUM 200 MG/2ML IV SOLN
INTRAVENOUS | Status: DC | PRN
  Administered 2023-06-22: 200 mg via INTRAVENOUS

## 2023-06-22 MED ORDER — AMISULPRIDE (ANTIEMETIC) 5 MG/2ML IV SOLN: 10.0000 mg | Freq: Once | INTRAVENOUS | Status: DC | PRN

## 2023-06-22 MED ORDER — PHENYLEPHRINE 80 MCG/ML (10ML) SYRINGE FOR IV PUSH (FOR BLOOD PRESSURE SUPPORT)
PREFILLED_SYRINGE | INTRAVENOUS | Status: DC | PRN
  Administered 2023-06-22 (×3): 80 ug via INTRAVENOUS
  Administered 2023-06-22: 240 ug via INTRAVENOUS

## 2023-06-22 MED ORDER — HEPARIN SOD (PORK) LOCK FLUSH 100 UNIT/ML IV SOLN
500.0000 [IU] | Freq: Once | INTRAVENOUS | Status: AC | PRN
  Administered 2023-06-22: 500 [IU]

## 2023-06-22 MED ORDER — SODIUM CHLORIDE 0.9 % IV SOLN: INTRAVENOUS | Status: DC

## 2023-06-22 MED ORDER — HYDROMORPHONE HCL 1 MG/ML IJ SOLN: 0.2500 mg | INTRAMUSCULAR | Status: DC | PRN

## 2023-06-22 MED ORDER — SODIUM CHLORIDE 0.9% FLUSH
10.0000 mL | INTRAVENOUS | Status: DC | PRN
Start: 2023-06-22 — End: 2023-06-22
  Administered 2023-06-22: 10 mL

## 2023-06-22 MED ORDER — OXYCODONE HCL 5 MG/5ML PO SOLN
5.0000 mg | Freq: Once | ORAL | Status: DC | PRN
Start: 2023-06-22 — End: 2023-06-22

## 2023-06-22 MED ORDER — SODIUM CHLORIDE 0.9 % IV SOLN
INTRAVENOUS | Status: DC
Start: 2023-06-22 — End: 2023-06-22

## 2023-06-22 MED ORDER — ROCURONIUM BROMIDE 10 MG/ML (PF) SYRINGE
PREFILLED_SYRINGE | INTRAVENOUS | Status: DC | PRN
  Administered 2023-06-22: 30 mg via INTRAVENOUS
  Administered 2023-06-22 (×2): 10 mg via INTRAVENOUS

## 2023-06-22 MED ORDER — LIDOCAINE 2% (20 MG/ML) 5 ML SYRINGE
INTRAMUSCULAR | Status: DC | PRN
  Administered 2023-06-22: 50 mg via INTRAVENOUS

## 2023-06-22 MED ORDER — MIDAZOLAM HCL 2 MG/2ML IJ SOLN
INTRAMUSCULAR | Status: DC | PRN
  Administered 2023-06-22 (×2): 1 mg via INTRAVENOUS

## 2023-06-22 MED ORDER — DEXAMETHASONE SODIUM PHOSPHATE 10 MG/ML IJ SOLN
10.0000 mg | Freq: Once | INTRAMUSCULAR | Status: AC
Start: 2023-06-22 — End: 2023-06-22
  Administered 2023-06-22: 10 mg via INTRAVENOUS
  Filled 2023-06-22: qty 1

## 2023-06-22 MED ORDER — SODIUM CHLORIDE 0.9% FLUSH
10.0000 mL | Freq: Once | INTRAVENOUS | Status: AC
  Administered 2023-06-22: 10 mL

## 2023-06-22 MED ORDER — PHENYLEPHRINE HCL-NACL 20-0.9 MG/250ML-% IV SOLN
INTRAVENOUS | Status: DC | PRN
  Administered 2023-06-22: 40 ug/min via INTRAVENOUS

## 2023-06-22 MED ORDER — CHLORHEXIDINE GLUCONATE 0.12 % MT SOLN
15.0000 mL | Freq: Once | OROMUCOSAL | Status: AC
  Administered 2023-06-22: 15 mL via OROMUCOSAL
  Filled 2023-06-22: qty 15

## 2023-06-22 MED ORDER — ONDANSETRON HCL 4 MG/2ML IJ SOLN
INTRAMUSCULAR | Status: DC | PRN
  Administered 2023-06-22: 4 mg via INTRAVENOUS

## 2023-06-22 MED ORDER — SUCCINYLCHOLINE CHLORIDE 200 MG/10ML IV SOSY
PREFILLED_SYRINGE | INTRAVENOUS | Status: DC | PRN
  Administered 2023-06-22: 120 mg via INTRAVENOUS

## 2023-06-22 NOTE — Telephone Encounter (Signed)
 Received FMLA form emailed to [email or web address] for Evelyn Jordan's daughter from [employer].  No Authorization form or cover sheet received and page 4 of provider sheet filled in prior to being emailed.  Unable to reach daughter.  Connected with AMYRA VANTUYL, [phone] (home) to advise of above.  Scheduled tomorrow for XRT at 8:00 am and 9:00 am.  Form staff office numbers provided.  Reviwed [facility] Form Policy to allow 7-10 business days (14-calendar) to process forms.   I understand.  Will sign authorization tomorrow and will notify daughter to have her company send another page 4.  Currently denies questions or needs.

## 2023-06-22 NOTE — Progress Notes (Signed)
 [facility] Telephone:([phone]   Fax:([phone]  OFFICE PROGRESS NOTE  [name], MD [address] [place] [address]  DIAGNOSIS:  1) Stage IIIB (T3, N2, M0) non-small cell lung cancer, squamous cell carcinoma presented with obstructive left lingular mass with consolidation and collapse of the lingula as well as part of the left upper lobe and likely invasion of the mediastinum diagnosed in February 2024.  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  Biomarker Findings Microsatellite status - MS-Stable Tumor Mutational Burden - 5 Muts/Mb Genomic Findings For a complete list of the genes assayed, please refer to the Appendix. CCND1 amplification FGFR1 amplification MDM2 amplification FGF19 amplification FGF3 amplification FGF4 amplification TP53 Q136* 8 Disease relevant genes with no reportable alterations: ALK, BRAF, EGFR, ERBB2, KRAS, MET, RET, ROS1  PDL1: 5%   PRIOR THERAPY:  1) Concurrent chemoradiation with weekly carboplatin  for AUC of 2 and paclitaxel  45 Mg/M2. Status post 7 weeks of treatment.  Last dose was given October 06, 2022 with partial response. 2) Consolidation treatment with immunotherapy with Imfinzi  1500 Mg IV every 4 weeks.  First dose on November 13, 2022.  Status post 2 cycles.  This was discontinued secondary to toxicity and suspicious immunotherapy mediated pneumonitis. 3) Right Kidney and Ureter Nephrectomy under the care of Dr. Carolynn. Tumor extensively involves renal pelvis, extends through the kidney into the perinephric fat with lymphovascular invasion and positive margin at the peri soft tissue of the renal vein   CURRENT THERAPY: Carboplatin  on day 1 and gemcitabine  on days 1 and 8 IV every 3 weeks. First dose expected on 06/15/2023  INTERVAL HISTORY: Evelyn Jordan 73 y.o. female returns to the clinic today for follow-up visit accompanied by her  husband. Discussed the use of AI scribe software for clinical note transcription with the patient, who gave verbal consent to proceed.  History of Present Illness   The patient, a 73 year old with a history of stage 3B non-small cell lung carcinoma (NSCLC), initially diagnosed in February 2024, underwent chemotherapy and radiation therapy. She also received 2 rounds of consolidation immunotherapy with durvalumab , which was discontinued after two doses due to intolerance and possible pneumonitis. In December 2024, she was diagnosed with metastatic urothelial carcinoma, leading to the removal of the right kidney and ureter.  Last week, the patient started a new treatment regimen with carboplatin  and gemcitabine . She tolerated the first dose well, with no reported reactions, nausea, fever, chills, or vomiting. She also denied any bleeding or bruising. She felt strong enough to proceed with day eight of the first cycle of treatment.  The patient reported a new pain in the mid-back, which she couldn't explain. She also mentioned muscle pain in the right groin following the nephrectomy. She requested a muscle relaxer to manage this discomfort, as she preferred it over her current pain medication, Percocet, which made her feel foggy.  Despite her medical conditions, the patient reported a good appetite and has been eating well. She lost one pound, which she attributed to the removal of her kidney. She also underwent a bronchoscopy earlier in the day of the consultation.  The patient planned a seven-day cruise, depending on how she felt.        MEDICAL HISTORY: Past Medical History:  Diagnosis Date   Depression    Fatty liver    Insomnia    Lung cancer (HCC) 07/25/2022   Osteoporosis  Pericardial effusion 03/05/2023   small noted on ECHO   Pulmonary nodule    Thyroid  nodule     ALLERGIES:  is allergic to [employer]  dimeglumine] and compazine [prochlorperazine  edisylate].  MEDICATIONS:  Current Outpatient Medications  Medication Sig Dispense Refill   acetaminophen  (TYLENOL ) 325 MG tablet Take 2 tablets (650 mg total) by mouth every 6 (six) hours as needed for mild pain (pain score 1-3) (or Fever >/= 101).     albuterol  (VENTOLIN  HFA) 108 (90 Base) MCG/ACT inhaler Inhale 2 puffs into the lungs every 6 (six) hours as needed for wheezing or shortness of breath. (Patient not taking: Reported on 06/16/2023) 8 g 6   ALPRAZolam  (XANAX ) 0.5 MG tablet Take 0.5 mg by mouth daily as needed for anxiety or sleep.     bisacodyl  (DULCOLAX) 5 MG EC tablet Take 10-15 mg by mouth daily as needed for moderate constipation.     eszopiclone  (LUNESTA ) 2 MG TABS tablet Take 2 mg by mouth at bedtime as needed for sleep. Take immediately before bedtime     gabapentin  (NEURONTIN ) 300 MG capsule Take 300 mg by mouth 2 (two) times daily.     HYDROcodone  bit-homatropine (HYDROMET) 5-1.5 MG/5ML syrup Take 5 mLs by mouth every 6 (six) hours as needed for cough.     Iron, Ferrous Sulfate, 325 (65 Fe) MG TABS Take 65 mg by mouth daily with breakfast.     levothyroxine  (SYNTHROID ) 100 MCG tablet Take 1 tablet (100 mcg total) by mouth daily at 6 (six) AM. 30 tablet 2   ondansetron  (ZOFRAN ) 8 MG tablet Take 1 tablet (8 mg total) by mouth every 8 (eight) hours as needed for nausea or vomiting. Start after day 3 of chemotherapy if needed 30 tablet 2   oxybutynin  (DITROPAN ) 5 MG tablet Take 1 tablet (5 mg total) by mouth every 8 (eight) hours as needed for bladder spasms. (Patient not taking: Reported on 06/16/2023) 30 tablet 1   oxyCODONE -acetaminophen  (PERCOCET) 5-325 MG tablet Take 1 tablet by mouth every 4 (four) hours as needed. 40 tablet 0   phenazopyridine  (PYRIDIUM ) 200 MG tablet Take 1 tablet (200 mg total) by mouth 3 (three) times daily as needed (for pain with urination). (Patient not taking: Reported on 06/16/2023) 30 tablet 0   No current facility-administered medications for this  visit.    SURGICAL HISTORY:  Past Surgical History:  Procedure Laterality Date   ANKLE SURGERY Right    BREAST BIOPSY Right    benign 2013   BRONCHIAL BIOPSY  08/04/2022   Procedure: BRONCHIAL BIOPSIES;  Surgeon: Shelah Lamar RAMAN, MD;  Location: [facility] ENDOSCOPY;  Service: Cardiopulmonary;;   BRONCHIAL BRUSHINGS  08/04/2022   Procedure: BRONCHIAL BRUSHINGS;  Surgeon: Shelah Lamar RAMAN, MD;  Location: [facility] ENDOSCOPY;  Service: Cardiopulmonary;;   COLONOSCOPY     CYSTOSCOPY WITH BIOPSY N/A 05/15/2023   Procedure: CYSTOSCOPY WITH RIGHT RENAL PELVIS BIOPSY;  Surgeon: Devere Lonni Righter, MD;  Location: [facility] ORS;  Service: Urology;  Laterality: N/A;   CYSTOSCOPY WITH STENT PLACEMENT Right 05/26/2023   Procedure: CYSTOSCOPY WITH RIGHT STENT PLACEMENT; TUR OF RIGHT URETERAL ORISICE;  Surgeon: Devere Lonni Righter, MD;  Location: [facility] ORS;  Service: Urology;  Laterality: Right;   CYSTOSCOPY/RETROGRADE/URETEROSCOPY N/A 05/15/2023   Procedure: CYSTOSCOPY, RIGHT RETROGRADE, RIGHT URETEROSCOPY, RIGHT STENT PLACEMENT;  Surgeon: Devere Lonni Righter, MD;  Location: [facility] ORS;  Service: Urology;  Laterality: N/A;  30 MINUTES   HEMOSTASIS CONTROL  08/04/2022   Procedure: HEMOSTASIS CONTROL;  Surgeon:  Shelah Lamar RAMAN, MD;  Location: [facility] ENDOSCOPY;  Service: Cardiopulmonary;;   IR IMAGING GUIDED PORT INSERTION  09/04/2022   repair of a torn meniscus Left 05/2018   ROBOT ASSITED LAPAROSCOPIC NEPHROURETERECTOMY Right 05/26/2023   Procedure: XI ROBOT ASSITED LAPAROSCOPIC NEPHROURETERECTOMY;  Surgeon: Devere Lonni Righter, MD;  Location: [facility] ORS;  Service: Urology;  Laterality: Right;  210 MINS FOR CASE   VIDEO BRONCHOSCOPY Left 08/04/2022   Procedure: VIDEO BRONCHOSCOPY WITH FLUORO;  Surgeon: Shelah Lamar RAMAN, MD;  Location: [facility] ENDOSCOPY;  Service: Cardiopulmonary;  Laterality: Left;    REVIEW OF SYSTEMS:  Constitutional: negative Eyes: negative Ears, nose, mouth, throat, and face: negative Respiratory:  negative Cardiovascular: negative Gastrointestinal: negative Genitourinary:negative Integument/breast: negative Hematologic/lymphatic: negative Musculoskeletal:negative Neurological: negative Behavioral/Psych: negative Endocrine: negative Allergic/Immunologic: negative   PHYSICAL EXAMINATION: General appearance: alert, cooperative, fatigued, and no distress Head: Normocephalic, without obvious abnormality, atraumatic Neck: no adenopathy, no JVD, supple, symmetrical, trachea midline, and thyroid  not enlarged, symmetric, no tenderness/mass/nodules Lymph nodes: Cervical, supraclavicular, and axillary nodes normal. Resp: clear to auscultation bilaterally Back: symmetric, no curvature. ROM normal. No CVA tenderness. Cardio: regular rate and rhythm, S1, S2 normal, no murmur, click, rub or gallop GI: soft, non-tender; bowel sounds normal; no masses,  no organomegaly Extremities: extremities normal, atraumatic, no cyanosis or edema Neurologic: Alert and oriented X 3, normal strength and tone. Normal symmetric reflexes. Normal coordination and gait  ECOG PERFORMANCE STATUS: 1 - Symptomatic but completely ambulatory  Blood pressure 124/78, pulse 93, temperature (!) 97.3 F (36.3 C), temperature source Temporal, resp. rate 16, height 5' 3 (1.6 m), weight 116 lb 12.8 oz (53 kg), SpO2 100%.  LABORATORY DATA: Lab Results  Component Value Date   WBC 6.2 06/16/2023   HGB 9.9 (L) 06/16/2023   HCT 29.0 (L) 06/16/2023   MCV 96.3 06/16/2023   PLT 277 06/16/2023      Chemistry      Component Value Date/Time   NA 137 06/16/2023 1549   NA 141 03/07/2015 0000   K 4.3 06/16/2023 1549   CL 106 06/16/2023 1549   CO2 27 06/16/2023 0741   BUN 23 06/16/2023 1549   CREATININE 1.20 (H) 06/16/2023 1549   CREATININE 1.33 (H) 06/16/2023 0741   GLU 103 03/07/2015 0000      Component Value Date/Time   CALCIUM 9.3 06/16/2023 0741   ALKPHOS 55 06/16/2023 0741   AST 12 (L) 06/16/2023 0741   ALT 11  06/16/2023 0741   BILITOT 0.3 06/16/2023 0741       RADIOGRAPHIC STUDIES: DG C-ARM BRONCHOSCOPY Result Date: 06/22/2023 C-ARM BRONCHOSCOPY: Fluoroscopy was utilized by the requesting physician.  No radiographic interpretation.   DG C-Arm 1-60 Min-No Report Result Date: 06/22/2023 Fluoroscopy was utilized by the requesting physician.  No radiographic interpretation.   DG Chest Port 1 View Result Date: 06/22/2023 CLINICAL DATA:  Status post bronchoscopy EXAM: PORTABLE CHEST 1 VIEW COMPARISON:  X-ray 12/17/2022.  PET-CT 06/15/2023. FINDINGS: Right IJ chest port in place with tip along the SVC right atrial junction region. There is a left perihilar confluence opacity with some volume loss along left hemithorax, left lower lobe opacity. Areas of nodularity in the left midlung also seen. There is a right lower lobe lateral lung nodule as well. No right-sided pneumothorax or effusion. No right-sided consolidation. Normal cardiopericardial silhouette. Overlapping cardiac leads. Please correlate with the prior PET-CT. IMPRESSION: No pneumothorax post bronchoscopy. Electronically Signed   By: Ranell Bring M.D.   On: 06/22/2023 11:49   NM  PET Image Restage (PS) Skull Base to Thigh (F-18 FDG) Result Date: 06/19/2023 CLINICAL DATA:  Subsequent treatment strategy for non-small cell lung cancer. Additional history of high-grade urothelial carcinoma. EXAM: NUCLEAR MEDICINE PET SKULL BASE TO THIGH TECHNIQUE: 5.7 mCi F-18 FDG was injected intravenously. Full-ring PET imaging was performed from the skull base to thigh after the radiotracer. CT data was obtained and used for attenuation correction and anatomic localization. Fasting blood glucose: 95 mg/dl COMPARISON:  FDG PET scan 08/13/2022 CT chest 05/25/2023, CT abdomen pelvis 06/16/2023. FINDINGS: NECK: No hypermetabolic lymph nodes in the neck. Incidental CT findings: Bilateral hypermetabolic pulmonary nodules. For example superior segment LEFT lower lobe nodule  measuring 14 mm (image 59) with SUV max equal 8.1. Hypermetabolic LEFT lower lobe pulmonary nodule imbedded within segmental atelectasis with SUV max equal 6.9 on image 76. Hypermetabolic RIGHT lower lobe nodule measures 10 mm with SUV max equal 7.2 on image 76. No hypermetabolic mediastinal lymph nodes. CHEST: Dense atelectasis/consolidation in the LEFT lower lobe. Incidental CT findings: None. ABDOMEN/PELVIS: large intensely hypermetabolic retroperitoneal lymph node in the upper abdomen at the level of the renal veins measuring 2.9 cm (image 17) with SUV max equal 14.0. Adjacent paraspinal node beneath the upper insertion of the RIGHT psoas with SUV max equal 13.3 (image 117). hypermetabolic aortocaval retroperitoneal node on image 114. Mesenteric nodule in the LEFT upper quadrant measuring 16 mm on image 132 with SUV max 8.3. Incidental CT findings: Solitary LEFT kidney. Post RIGHT nephrectomy. Bladder normal. SKELETON: RIGHT subtle focus of metabolic activity LEFT iliac wing with SUV max equal 4.8 on image 150. No CT correlation. Larger/intense radiotracer activity in the spinous process of the T12 vertebral body with SUV max equal 13.7 on image 101. There is subtle bone destruction at this level. Incidental CT findings: None. IMPRESSION: 1. Hypermetabolic bilateral pulmonary nodules consistent with pulmonary metastasis. 2. Hypermetabolic retroperitoneal lymph nodes in the upper abdomen consistent with metastatic adenopathy. 3. Hypermetabolic mesenteric nodule in the LEFT upper quadrant consistent with metastatic adenopathy. 4. Hypermetabolic osseous metastasis in the spinous process of the T12 vertebral body and LEFT sacral ala. 5. Solitary LEFT kidney post RIGHT nephrectomy Electronically Signed   By: Jackquline Boxer M.D.   On: 06/19/2023 14:25   MR Brain W Wo Contrast Result Date: 06/17/2023 CLINICAL DATA:  Metastatic disease evaluation Newly diagnosed stage IV either lung or urothelial cancer restaging  EXAM: MRI HEAD WITHOUT AND WITH CONTRAST TECHNIQUE: Multiplanar, multiecho pulse sequences of the brain and surrounding structures were obtained without and with intravenous contrast. CONTRAST:  6mL GADAVIST  GADOBUTROL  1 MMOL/ML IV SOLN COMPARISON:  MRI head August 18, 2022. FINDINGS: Brain: No evidence of acute infarct, acute hemorrhage, midline shift or hydrocephalus. Unchanged 1.3 cm enhancing extra-axial dural-based lesion along the right frontal convexity, compatible with a meningioma. No pathologic enhancement. Vascular: Major arterial flow voids are maintained. Skull and upper cervical spine: Normal marrow signal. Sinuses/Orbits: Clear sinuses.  No acute orbital findings. Other: Trace left mastoid effusion. IMPRESSION: 1. No evidence of acute intracranial abnormality or metastatic disease. 2. Unchanged putative 1.3 cm meningioma along the right frontal convexity without significant mass effect. Electronically Signed   By: Gilmore GORMAN Molt M.D.   On: 06/17/2023 08:15   CT ABDOMEN PELVIS W CONTRAST Result Date: 06/16/2023 CLINICAL DATA:  Right lower quadrant abdominal pain. History of non-small cell lung cancer with metastatic disease and urothelial cancer. EXAM: CT ABDOMEN AND PELVIS WITH CONTRAST TECHNIQUE: Multidetector CT imaging of the abdomen and pelvis was performed  using the standard protocol following bolus administration of intravenous contrast. RADIATION DOSE REDUCTION: This exam was performed according to the departmental dose-optimization program which includes automated exposure control, adjustment of the mA and/or kV according to patient size and/or use of iterative reconstruction technique. CONTRAST:  OMNIPAQUE  IOHEXOL  300 MG/ML  SOLN COMPARISON:  CT of the chest 05/25/2023. CT abdomen and pelvis 05/09/2023. FINDINGS: Lower chest: Nodular densities in the lung bases measuring up to 5 mm appear unchanged. There is a calcified nodule in the right lung base, unchanged. Hepatobiliary: No  focal liver abnormality is seen. No gallstones, gallbladder wall thickening, or biliary dilatation. Pancreas: Unremarkable. No pancreatic ductal dilatation or surrounding inflammatory changes. Spleen: Normal in size without focal abnormality. Adrenals/Urinary Tract: Right nephrectomy changes are present. Right adrenal gland not seen. The left adrenal gland appears within normal limits. There is vague focal area of hypodensity in the posterior left kidney measuring 2.1 by 2.8 by 1.7 cm. This appears heterogeneous with some mild vascularity. No perinephric fat stranding or hydronephrosis. Bladder is within normal limits. Stomach/Bowel: Stomach is within normal limits. Appendix app is not seen ears normal. No evidence of bowel wall thickening, distention, or inflammatory changes. Vascular/Lymphatic: Aorta is normal in size. There are atherosclerotic calcifications of the aorta. There is a 3.5 x 3.5 by 4.1 cm heterogeneous hypoechoic area in the right retroperitoneum the level of the right kidney which appears similar to the prior study. This likely compresses or invades the IVC. This is slightly increased in size when compared to prior. There is a new rounded soft tissue mass with central hypodensity in the left mesentery measuring 2.1 x 1.9 cm worrisome for a necrotic lymph node. Reproductive: Uterus and bilateral adnexa are unremarkable. Other: There is a small amount of free fluid in the pelvis. There is no focal abdominal wall hernia. There is new intramuscular thickening and edema with central hypodensity in the lower left anterior abdominal wall. This is ill-defined area measuring proximally 1.6 x 2.4 cm image 2/40. Musculoskeletal: Degenerative changes affect the spine and hips. Chronic fractures of the right transverse processes at L3 and L4 appear unchanged. IMPRESSION: 1. New 2.1 cm necrotic lymph node in the left mesentery worrisome for metastatic disease. 2. New intramuscular thickening and edema with  central hypodensity in the lower left anterior abdominal wall worrisome for metastatic disease. 3. Slight increase in size of the heterogeneous hypoechoic area in the right retroperitoneum at the level of the right kidney. This compresses and possibly invades the IVC. Findings are worrisome for metastatic disease. 4. Vague focal area of hypodensity in the posterior left kidney measuring 2.8 cm worrisome for metastatic disease. Infection not excluded. 5. Small amount of free fluid in the pelvis. 6. Stable nodular densities in the lung bases measuring up to 5 mm. 7. Aortic atherosclerosis. Aortic Atherosclerosis (ICD10-I70.0). Electronically Signed   By: Greig Pique M.D.   On: 06/16/2023 16:35   CT Chest W Contrast Result Date: 06/06/2023 CLINICAL DATA:  Non-small cell lung cancer, for staging EXAM: CT CHEST WITH CONTRAST TECHNIQUE: Multidetector CT imaging of the chest was performed during intravenous contrast administration. RADIATION DOSE REDUCTION: This exam was performed according to the departmental dose-optimization program which includes automated exposure control, adjustment of the mA and/or kV according to patient size and/or use of iterative reconstruction technique. CONTRAST:  75mL OMNIPAQUE  IOHEXOL  300 MG/ML  SOLN COMPARISON:  03/05/2023 FINDINGS: Cardiovascular: The heart is normal in size. Small pericardial effusion. Leftward cardiomediastinal shift. No evidence of thoracic  aortic aneurysm. Mild atherosclerotic calcifications of the aortic arch. Moderate three-vessel coronary atherosclerosis. Right chest port terminates in the upper right atrium. Mediastinum/Nodes: No suspicious mediastinal lymphadenopathy. Visualized thyroid  is unremarkable. Lungs/Pleura: Radiation changes with volume loss in the lingula and central left lower lobe. Underlying prior lingular nodule is obscured. 2.8 x 1.3 cm nodular opacity in the medial left upper lobe abutting the mediastinum (series 2/image 88), new. 12 x 9 mm  nodule in the posterior left upper lobe (series 7/image 85), previously 7 x 5 mm. Additional scattered bilateral pulmonary nodules, approximately 20-25 in number, new from prior CT chest and new/progressive from prior CT abdomen/pelvis and cardiac CT. Index nodules include: --4 mm right upper lobe nodule (series 7/image 36) --5 mm medial left upper lobe nodule (series 7/image 47) --8 mm anterior right lower lobe nodule (series 7/image 89) --6 mm posterior right lower lobe nodule (series 7/image 113) Additional calcified granulomata in the right lung, benign. No focal consolidation. Mild centrilobular and paraseptal emphysematous changes, upper lung predominant. Small left pleural effusion.  No pneumothorax. Upper Abdomen: Visualized upper abdomen is grossly unremarkable. Musculoskeletal: Mild degenerative changes of the visualized thoracolumbar spine. IMPRESSION: Radiation changes in the lingula and central left lower lobe. Underlying prior lingular nodule is obscured. New/progressive bilateral pulmonary nodules, as described above, compatible with widespread pulmonary metastases. Small left pleural effusion. Aortic Atherosclerosis (ICD10-I70.0) and Emphysema (ICD10-J43.9). Electronically Signed   By: Pinkie Pebbles M.D.   On: 06/06/2023 01:33   DG C-Arm 1-60 Min-No Report Result Date: 05/26/2023 Fluoroscopy was utilized by the requesting physician.  No radiographic interpretation.    ASSESSMENT AND PLAN: This is a very pleasant 73 years old white female with  1) Stage IIIB (T3, N2, M0) non-small cell lung cancer, squamous cell carcinoma presented with obstructive left lingular mass with consolidation and collapse of the lingula as well as part of the left upper lobe and likely invasion of the mediastinum diagnosed in February 2024.  Molecular studies by foundation 1 showed no actionable mutations PDL1: 5% The patient underwent a course of concurrent chemoradiation with weekly carboplatin  for AUC of 2  and paclitaxel  45 Mg/M2 status post 7  cycles.  Last dose was given on October 06, 2022 with partial response. The patient tolerated the previous course of her treatment fairly well with no concerning adverse effect except for the fatigue and the radiation-induced esophagitis. She had partial response to this treatment. She underwent consolidation treatment with immunotherapy with Imfinzi  1500 Mg IV every 4 weeks status post 2 cycle.  Her treatment was discontinued secondary to toxicity.  The patient has been on observation 2) metastatic urothelial carcinoma with squamous cell differentiation status post right ureteral nephrectomy in December 2024.  She is currently on systemic chemotherapy with carboplatin  for AUC of 5 on day 1 and gemcitabine  1000 Mg/M2 on days 1 and 8 every 3 weeks.  She is status post day 1 of cycle #1.  She had bronchoscopy performed earlier today.  I discussed with the patient delaying her treatment because of the procedure today but she declined and she would like to proceed with the treatment as planned and she mentioned that she is feeling very well with no concerning issues. Assessment and Plan    Metastatic Urothelial Carcinoma Diagnosed December 2024. Underwent right nephrectomy and ureterectomy. Currently on carboplatin  and gemcitabine . No adverse reactions to first dose. WBC count slightly low but acceptable. Feels strong enough for day 8 of first cycle. Discussed monitoring for fever, chills, and  infection signs during travel due to low WBC count. - Proceed with day 8 of chemotherapy with gemcitabine  - Monitor WBC count - Provide antibiotics (500 mg tablets, 10 tablets) for use if infection symptoms occur during travel  Squamous Cell Carcinoma of the Lung Diagnosed February 2024, stage 3B. Underwent chemoradiation and consolidation immunotherapy with durvalumab , discontinued after two doses due to intolerance and questionable pneumonitis. No current signs of recurrence. -  Monitor for recurrence or complications  Mid-Back Pain New onset, possibly related to retroperitoneal lymph node involvement. Discussed referral to palliative care for pain management. - Refer to palliative care for pain management  Post-Nephrectomy Pain Muscle pain in right groin area post-nephrectomy. Prefers muscle relaxer over Percocet due to side effects. Discussed importance of not overusing muscle relaxants. - Prescribe Flexeril  (cyclobenzaprine ) - Advise against overuse of Flexeril   General Health Maintenance Planning a seven-day cruise. Advised to avoid travel if experiencing fever or chills due to low WBC count. Provided antibiotics for use during travel if infection symptoms occur. - Advise to avoid travel if experiencing fever or chills - Provide antibiotics for use during travel if infection symptoms occur  Follow-up - Follow-up visit in two weeks - Full body scan in three months.  She was advised to call immediately if she has any other concerning symptoms in the interval. The patient voices understanding of current disease status and treatment options and is in agreement with the current care plan.  All questions were answered. The patient knows to call the clinic with any problems, questions or concerns. We can certainly see the patient much sooner if necessary.  The total time spent in the appointment was 30 minutes.  Disclaimer: This note was dictated with voice recognition software. Similar sounding words can inadvertently be transcribed and may not be corrected upon review.

## 2023-06-22 NOTE — Anesthesia Procedure Notes (Signed)
 Procedure Name: Intubation Date/Time: 06/22/2023 9:00 AM  Performed by: Cindie Donald CROME, CRNAPre-anesthesia Checklist: Patient identified, Emergency Drugs available, Suction available and Patient being monitored Patient Re-evaluated:Patient Re-evaluated prior to induction Oxygen Delivery Method: Circle System Utilized Preoxygenation: Pre-oxygenation with 100% oxygen Induction Type: IV induction Ventilation: Mask ventilation without difficulty Laryngoscope Size: Mac and 3 Grade View: Grade II Tube type: Oral Tube size: 8.5 mm Number of attempts: 1 Airway Equipment and Method: Stylet Placement Confirmation: ETT inserted through vocal cords under direct vision, positive ETCO2 and breath sounds checked- equal and bilateral Secured at: 19 cm Tube secured with: Tape Dental Injury: Teeth and Oropharynx as per pre-operative assessment

## 2023-06-22 NOTE — Transfer of Care (Signed)
 Immediate Anesthesia Transfer of Care Note  Patient: Evelyn Jordan  Procedure(s) Performed: ROBOTIC ASSISTED NAVIGATIONAL BRONCHOSCOPY BRONCHIAL BIOPSIES BRONCHIAL BRUSHINGS BRONCHIAL NEEDLE ASPIRATION BIOPSIES BRONCHIAL WASHINGS  Patient Location: PACU  Anesthesia Type:General  Level of Consciousness: drowsy and patient cooperative  Airway & Oxygen Therapy: Patient Spontanous Breathing and Patient connected to face mask oxygen  Post-op Assessment: Report given to RN and Post -op Vital signs reviewed and stable  Post vital signs: Reviewed and stable  Last Vitals:  Vitals Value Taken Time  BP 127/67 06/22/23 1024  Temp 36.9 C 06/22/23 1024  Pulse 85 06/22/23 1025  Resp 17 06/22/23 1025  SpO2 100 % 06/22/23 1025  Vitals shown include unfiled device data.  Last Pain:  Vitals:   06/22/23 0706  PainSc: 4          Complications: No notable events documented.

## 2023-06-22 NOTE — Op Note (Signed)
 Video Bronchoscopy with Robotic Assisted Bronchoscopic Navigation   Date of Operation: 06/22/2023   Pre-op Diagnosis: Bilateral pulmonary nodules  Post-op Diagnosis: Same  Surgeon: Lamar Chris  Assistants: None  Anesthesia: General endotracheal anesthesia  Operation: Flexible video fiberoptic bronchoscopy with robotic assistance and biopsies.  Estimated Blood Loss: Minimal  Complications: None  Indications and History: Evelyn Jordan is a 73 y.o. female with history of squamous cell lung cancer and also renal cell cancer.  Surveillance PET scan revealed bilateral hypermetabolic pulmonary nodules.  Recommendation made to achieve a tissue diagnosis via robotic assisted navigational bronchoscopy. The risks, benefits, complications, treatment options and expected outcomes were discussed with the patient.  The possibilities of pneumothorax, pneumonia, reaction to medication, pulmonary aspiration, perforation of a viscus, bleeding, failure to diagnose a condition and creating a complication requiring transfusion or operation were discussed with the patient who freely signed the consent.    Description of Procedure: The patient was seen in the Preoperative Area, was examined and was deemed appropriate to proceed.  The patient was taken to [facility] endoscopy room 3, identified as Evelyn Jordan and the procedure verified as Flexible Video Fiberoptic Bronchoscopy.  A Time Out was held and the above information confirmed.   Prior to the date of the procedure a high-resolution CT scan of the chest was performed. Utilizing ION software program a virtual tracheobronchial tree was generated to allow the creation of distinct navigation pathways to the patient's parenchymal abnormalities. After being taken to the operating room general anesthesia was initiated and the patient  was orally intubated. The video fiberoptic bronchoscope was introduced via the endotracheal tube and a general inspection was performed  which showed normal right sided anatomy.  The lingular airway was obliterated with edema and hyperemia present.  The remaining left upper lobe airway was narrow but the scope would pass.  The segmental airways were patent without any evidence of endobronchial lesion. Aspiration of the bilateral mainstems was completed to remove any remaining secretions. Robotic catheter inserted into patient's endotracheal tube.   Target #1 left upper lobe pulmonary nodule: The distinct navigation pathways prepared prior to this procedure were then utilized to navigate to patient's lesion identified on CT scan. The robotic catheter was secured into place and the vision probe was withdrawn.  Lesion location was approximated using fluoroscopy.  Local registration and targeting was performed using Cios three-dimensional imaging. Under fluoroscopic guidance transbronchial needle brushings, transbronchial needle biopsies, and transbronchial forceps biopsies were performed to be sent for cytology and pathology.    Target #4 right lower lobe pulmonary nodule: The distinct navigation pathways prepared prior to this procedure were then utilized to navigate to patient's lesion identified on CT scan. The robotic catheter was secured into place and the vision probe was withdrawn.  Lesion location was approximated using fluoroscopy. Local registration and targeting was performed using Cios three-dimensional imaging. Under fluoroscopic guidance transbronchial needle brushings, transbronchial needle biopsies, and transbronchial forceps biopsies were performed to be sent for cytology and pathology. A bronchioalveolar lavage was performed in the right lower lobe and sent for cytology.  At the end of the procedure a general airway inspection was performed and there was no evidence of active bleeding. The bronchoscope was removed.  The patient tolerated the procedure well. There was no significant blood loss and there were no obvious  complications. A post-procedural chest x-ray is pending.  Samples Target #1: 1. Transbronchial needle brushings from left upper lobe nodule 2. Transbronchial Wang needle biopsies from left  upper lobe nodule 3. Transbronchial forceps biopsies from left upper lobe nodule  Samples Target #2: 1. Transbronchial needle brushings from right lower lobe nodule 2. Transbronchial Wang needle biopsies from right lower lobe nodule 3. Transbronchial forceps biopsies from right lower lobe nodule 4. Bronchoalveolar lavage from right lower lobe   Plans:  The patient will be discharged from the PACU to home when recovered from anesthesia and after chest x-ray is reviewed. We will review the cytology, pathology and microbiology results with the patient when they become available. Outpatient followup will be with Dr. Shelah, Dr. Sherrod.   Lamar Shelah, MD, PhD 06/22/2023, 10:21 AM [facility] [phone] or if no answer before 7:00PM call [phone] For any issues after 7:00PM please call eLink [phone]

## 2023-06-22 NOTE — Patient Instructions (Signed)
 [facility] - A DEPT OF [facility]  Discharge Instructions: Thank you for choosing [facility] to provide your oncology and hematology care.   If you have a lab appointment with the Cancer Center, please go directly to the Cancer Center and check in at the registration area.   Wear comfortable clothing and clothing appropriate for easy access to any Portacath or PICC line.   We strive to give you quality time with your provider. You may need to reschedule your appointment if you arrive late (15 or more minutes).  Arriving late affects you and other patients whose appointments are after yours.  Also, if you miss three or more appointments without notifying the office, you may be dismissed from the clinic at the provider's discretion.      For prescription refill requests, have your pharmacy contact our office and allow 72 hours for refills to be completed.    Today you received the following chemotherapy and/or immunotherapy agents: Gemcitabine.       To help prevent nausea and vomiting after your treatment, we encourage you to take your nausea medication as directed.  BELOW ARE SYMPTOMS THAT SHOULD BE REPORTED IMMEDIATELY: *FEVER GREATER THAN 100.4 F (38 C) OR HIGHER *CHILLS OR SWEATING *NAUSEA AND VOMITING THAT IS NOT CONTROLLED WITH YOUR NAUSEA MEDICATION *UNUSUAL SHORTNESS OF BREATH *UNUSUAL BRUISING OR BLEEDING *URINARY PROBLEMS (pain or burning when urinating, or frequent urination) *BOWEL PROBLEMS (unusual diarrhea, constipation, pain near the anus) TENDERNESS IN MOUTH AND THROAT WITH OR WITHOUT PRESENCE OF ULCERS (sore throat, sores in mouth, or a toothache) UNUSUAL RASH, SWELLING OR PAIN  UNUSUAL VAGINAL DISCHARGE OR ITCHING   Items with * indicate a potential emergency and should be followed up as soon as possible or go to the Emergency Department if any problems should occur.  Please show the CHEMOTHERAPY ALERT CARD or  IMMUNOTHERAPY ALERT CARD at check-in to the Emergency Department and triage nurse.  Should you have questions after your visit or need to cancel or reschedule your appointment, please contact [facility] - A DEPT OF [facility]  Dept: [phone]  and follow the prompts.  Office hours are 8:00 a.m. to 4:30 p.m. Monday - Friday. Please note that voicemails left after 4:00 p.m. may not be returned until the following business day.  We are closed weekends and major holidays. You have access to a nurse at all times for urgent questions. Please call the main number to the clinic Dept: [phone] and follow the prompts.   For any non-urgent questions, you may also contact your provider using MyChart. We now offer e-Visits for anyone 19 and older to request care online for non-urgent symptoms. For details visit [email or web address].   Also download the MyChart app! Go to the app store, search "MyChart", open the app, select [facility], and log in with your MyChart username and password.

## 2023-06-22 NOTE — H&P (Signed)
 Evelyn Jordan is an 73 y.o. female.   Chief Complaint: Pulmonary nodules HPI: 73 year old woman whom I have followed for pulmonary nodular disease.  She was diagnosed with a lingular squamous cell lung cancer that was treated with chemoradiation and then Imfinzi .  That course was complicated by apparent pneumonitis, pleural effusion.  Since I have seen her she has been diagnosed with renal cell CA.  She has chest imaging and a PET scan that confirmed bilateral hypermetabolic pulmonary nodules concerning for metastatic disease.  She is here today for bronchoscopy to obtain a tissue diagnosis.  Patient understands the procedure, rationale, risk, benefits.  All questions answered.  Past Medical History:  Diagnosis Date   Depression    Fatty liver    Insomnia    Lung cancer (HCC) 07/25/2022   Osteoporosis    Pericardial effusion 03/05/2023   small noted on ECHO   Pulmonary nodule    Thyroid  nodule     Past Surgical History:  Procedure Laterality Date   ANKLE SURGERY Right    BREAST BIOPSY Right    benign 2013   BRONCHIAL BIOPSY  08/04/2022   Procedure: BRONCHIAL BIOPSIES;  Surgeon: Shelah Lamar RAMAN, MD;  Location: [facility] ENDOSCOPY;  Service: Cardiopulmonary;;   BRONCHIAL BRUSHINGS  08/04/2022   Procedure: BRONCHIAL BRUSHINGS;  Surgeon: Shelah Lamar RAMAN, MD;  Location: [facility] ENDOSCOPY;  Service: Cardiopulmonary;;   COLONOSCOPY     CYSTOSCOPY WITH BIOPSY N/A 05/15/2023   Procedure: CYSTOSCOPY WITH RIGHT RENAL PELVIS BIOPSY;  Surgeon: Devere Lonni Righter, MD;  Location[facility];  Service: Urology;  Laterality: N/A;   CYSTOSCOPY WITH STENT PLACEMENT Right 05/26/2023   Procedure: CYSTOSCOPY WITH RIGHT STENT PLACEMENT; TUR OF RIGHT URETERAL ORISICE;  Surgeon: Devere Lonni Righter, MD;  Location: [facility];  Service: Urology;  Laterality: Right;   CYSTOSCOPY/RETROGRADE/URETEROSCOPY N/A 05/15/2023   Procedure: CYSTOSCOPY, RIGHT RETROGRADE, RIGHT URETEROSCOPY, RIGHT STENT PLACEMENT;  Surgeon:  Devere Lonni Righter, MD;  Location: [facility];  Service: Urology;  Laterality: N/A;  30 MINUTES   HEMOSTASIS CONTROL  08/04/2022   Procedure: HEMOSTASIS CONTROL;  Surgeon: Shelah Lamar RAMAN, MD;  Location: [facility] ENDOSCOPY;  Service: Cardiopulmonary;;   IR IMAGING GUIDED PORT INSERTION  09/04/2022   repair of a torn meniscus Left 05/2018   ROBOT ASSITED LAPAROSCOPIC NEPHROURETERECTOMY Right 05/26/2023   Procedure: XI ROBOT ASSITED LAPAROSCOPIC NEPHROURETERECTOMY;  Surgeon: Devere Lonni Righter, MD;  Location: [facility];  Service: Urology;  Laterality: Right;  210 MINS FOR CASE   VIDEO BRONCHOSCOPY Left 08/04/2022   Procedure: VIDEO BRONCHOSCOPY WITH FLUORO;  Surgeon: Shelah Lamar RAMAN, MD;  Location: [facility] ENDOSCOPY;  Service: Cardiopulmonary;  Laterality: Left;    Family History  Problem Relation Age of Onset   Alzheimer's disease Mother    Hypertension Father    Stroke Father    Hyperlipidemia Brother    Breast cancer Neg Hx    Social History:  reports that she quit smoking about 13 years ago. Her smoking use included cigarettes. She started smoking about 54 years ago. She has a 41 pack-year smoking history. She has never used smokeless tobacco. She reports current alcohol use. She reports that she does not use drugs.  Allergies:  Allergies  Allergen Reactions   Emend [Fosaprepitant  Dimeglumine] Other (See Comments)    Chest tightness with first Emend reaction.  See Progress Note 06/16/23.   Compazine [Prochlorperazine Edisylate] Other (See Comments)    Neck hyperextends    Medications Prior to Admission  Medication Sig Dispense Refill  acetaminophen  (TYLENOL ) 325 MG tablet Take 2 tablets (650 mg total) by mouth every 6 (six) hours as needed for mild pain (pain score 1-3) (or Fever >/= 101).     ALPRAZolam  (XANAX ) 0.5 MG tablet Take 0.5 mg by mouth daily as needed for anxiety or sleep.     bisacodyl  (DULCOLAX) 5 MG EC tablet Take 10-15 mg by mouth daily as needed for moderate  constipation.     eszopiclone  (LUNESTA ) 2 MG TABS tablet Take 2 mg by mouth at bedtime as needed for sleep. Take immediately before bedtime     gabapentin  (NEURONTIN ) 300 MG capsule Take 300 mg by mouth 2 (two) times daily.     HYDROcodone  bit-homatropine (HYDROMET) 5-1.5 MG/5ML syrup Take 5 mLs by mouth every 6 (six) hours as needed for cough.     Iron, Ferrous Sulfate, 325 (65 Fe) MG TABS Take 65 mg by mouth daily with breakfast.     levothyroxine  (SYNTHROID ) 100 MCG tablet Take 1 tablet (100 mcg total) by mouth daily at 6 (six) AM. 30 tablet 2   oxyCODONE -acetaminophen  (PERCOCET) 5-325 MG tablet Take 1 tablet by mouth every 4 (four) hours as needed. 40 tablet 0   albuterol  (VENTOLIN  HFA) 108 (90 Base) MCG/ACT inhaler Inhale 2 puffs into the lungs every 6 (six) hours as needed for wheezing or shortness of breath. (Patient not taking: Reported on 06/16/2023) 8 g 6   ondansetron  (ZOFRAN ) 8 MG tablet Take 1 tablet (8 mg total) by mouth every 8 (eight) hours as needed for nausea or vomiting. Start after day 3 of chemotherapy if needed 30 tablet 2   oxybutynin  (DITROPAN ) 5 MG tablet Take 1 tablet (5 mg total) by mouth every 8 (eight) hours as needed for bladder spasms. (Patient not taking: Reported on 06/16/2023) 30 tablet 1   phenazopyridine  (PYRIDIUM ) 200 MG tablet Take 1 tablet (200 mg total) by mouth 3 (three) times daily as needed (for pain with urination). (Patient not taking: Reported on 06/16/2023) 30 tablet 0    No results found for this or any previous visit (from the past 48 hours). No results found.  Review of Systems Patient feels well, no complaints    Blood pressure (!) 146/83, pulse 72, temperature (!) 97.4 F (36.3 C), resp. rate 18, height 5' 3 (1.6 m), weight 52.2 kg, SpO2 99%. Physical Exam  Gen: Pleasant, well-nourished, in no distress,  normal affect  ENT: No lesions,  mouth clear,  oropharynx clear, no postnasal drip  Neck: No JVD, no stridor  Lungs: No use of accessory  muscles, no crackles or wheezing on normal respiration, no wheeze on forced expiration  Cardiovascular: RRR, heart sounds normal, no murmur or gallops, no peripheral edema  Abdomen: soft and NT, no HSM,  BS normal  Musculoskeletal: No deformities, no cyanosis or clubbing  Neuro: alert, awake, non focal  Skin: Warm, no lesions or rashes    Assessment/Plan Bilateral pulmonary nodules in a patient with a history of squamous cell lung cancer and renal cell carcinoma.  She has bilateral pulmonary nodules suspicious for metastatic disease.  She presents today for navigational bronchoscopy and biopsies.  Rationale, risk, benefits discussed.  She understands and agrees to proceed.  No barriers identified.  Lamar GORMAN Chris, MD 06/22/2023, 8:43 AM

## 2023-06-22 NOTE — Discharge Instructions (Addendum)
 Flexible Bronchoscopy, Care After This sheet gives you information about how to care for yourself after your test. Your doctor may also give you more specific instructions. If you have problems or questions, contact your doctor. Follow these instructions at home: Eating and drinking When your numbness is gone and your cough and gag reflexes have come back, you may: Eat only soft foods. Slowly drink liquids. When you get home from the test, go back to your normal diet. Driving Do not drive for 24 hours if you were given a medicine to help you relax (sedative). Do not drive or use heavy machinery while taking prescription pain medicine. General instructions  Take over-the-counter and prescription medicines only as told by your doctor. Return to your normal activities as told. Ask what activities are safe for you. Do not use any products that have nicotine or tobacco in them. This includes cigarettes and e-cigarettes. If you need help quitting, ask your doctor. Keep all follow-up visits as told by your doctor. This is important. It is very important if you had a tissue sample (biopsy) taken. Get help right away if: You have shortness of breath that gets worse. You get light-headed. You feel like you are going to pass out (faint). You have chest pain. You cough up: More than a little blood. More blood than before. Summary Do not eat or drink anything (not even water) for 2 hours after your test, or until your numbing medicine wears off. Do not use cigarettes. Do not use e-cigarettes. Get help right away if you have chest pain.  Please call our office for any questions or concerns.  [phone].  This information is not intended to replace advice given to you by your health care provider. Make sure you discuss any questions you have with your health care provider. Document Released: 03/23/2009 Document Revised: 05/08/2017 Document Reviewed: 06/13/2016 Elsevier Patient Education  2020  [employer].

## 2023-06-22 NOTE — Anesthesia Postprocedure Evaluation (Signed)
 Anesthesia Post Note  Patient: Evelyn Jordan  Procedure(s) Performed: ROBOTIC ASSISTED NAVIGATIONAL BRONCHOSCOPY BRONCHIAL BIOPSIES BRONCHIAL BRUSHINGS BRONCHIAL NEEDLE ASPIRATION BIOPSIES BRONCHIAL WASHINGS     Patient location during evaluation: PACU Anesthesia Type: General Level of consciousness: awake and alert Pain management: pain level controlled Vital Signs Assessment: post-procedure vital signs reviewed and stable Respiratory status: spontaneous breathing, nonlabored ventilation and respiratory function stable Cardiovascular status: blood pressure returned to baseline and stable Postop Assessment: no apparent nausea or vomiting Anesthetic complications: no   No notable events documented.  Last Vitals:  Vitals:   06/22/23 1045 06/22/23 1100  BP: 118/76 123/76  Pulse: 80 78  Resp: 14 16  Temp:  36.7 C  SpO2: 96% 97%    Last Pain:  Vitals:   06/22/23 1100  PainSc: 0-No pain                 Butler Levander Pinal

## 2023-06-22 NOTE — Anesthesia Preprocedure Evaluation (Signed)
 Anesthesia Evaluation  Patient identified by MRN, date of birth, ID band Patient awake    Reviewed: Allergy & Precautions, H&P , NPO status , Patient's Chart, lab work & pertinent test results  Airway Mallampati: III  TM Distance: >3 FB Neck ROM: Full    Dental no notable dental hx. (+) Teeth Intact, Dental Advisory Given   Pulmonary neg pulmonary ROS, former smoker   Pulmonary exam normal breath sounds clear to auscultation       Cardiovascular Exercise Tolerance: Good negative cardio ROS  Rhythm:Regular Rate:Normal     Neuro/Psych    Depression    negative neurological ROS     GI/Hepatic negative GI ROS, Neg liver ROS,,,  Endo/Other  Hypothyroidism    Renal/GU Renal disease  negative genitourinary   Musculoskeletal   Abdominal   Peds  Hematology negative hematology ROS (+)   Anesthesia Other Findings Renal Cell Carcinoma  Reproductive/Obstetrics negative OB ROS                             Anesthesia Physical Anesthesia Plan  ASA: 3  Anesthesia Plan: General   Post-op Pain Management: Minimal or no pain anticipated   Induction: Intravenous  PONV Risk Score and Plan: 3 and Ondansetron , Dexamethasone , Treatment may vary due to age or medical condition and Midazolam   Airway Management Planned: Oral ETT  Additional Equipment:   Intra-op Plan:   Post-operative Plan: Extubation in OR  Informed Consent: I have reviewed the patients History and Physical, chart, labs and discussed the procedure including the risks, benefits and alternatives for the proposed anesthesia with the patient or authorized representative who has indicated his/her understanding and acceptance.     Dental advisory given  Plan Discussed with: CRNA  Anesthesia Plan Comments:        Anesthesia Quick Evaluation

## 2023-06-22 NOTE — Progress Notes (Signed)
 Thoracic Location of Tumor / Histology: Left Lung (upper lobe) Nodule   Past/Anticipated interventions by cardiothoracic surgery, if any:  06/22/2023 Dr. Lamar Chris Robotic Assisted Navigational Bronchoscopy   Past/Anticipated interventions by medical oncology, if any:  06/22/2023 Dr. Sherrod   Tobacco/Marijuana/Snuff/ETOH use: Former smoker, no drug use, alcohol use.  Signs/Symptoms Weight changes, if any: No Respiratory complaints, if any: No Hemoptysis, if any: No Pain issues, if any:  right groin and back 4  SAFETY ISSUES: Prior radiation? Yes, 08/25/22 - 10/08/22: Left Lung / 66 Gy in 33   fractions. Pacemaker/ICD? No  Possible current pregnancy? Postmenopausal Is the patient on methotrexate?  No  Current Complaints / other details:

## 2023-06-23 ENCOUNTER — Ambulatory Visit
Admission: RE | Admit: 2023-06-23 | Discharge: 2023-06-23 | Disposition: A | Discharge status: Home / Self Care | Payer: Medicare Other | Source: Ambulatory Visit | Attending: Radiation Oncology | Admitting: Radiation Oncology

## 2023-06-23 ENCOUNTER — Encounter: Payer: Self-pay | Admitting: Internal Medicine

## 2023-06-23 ENCOUNTER — Ambulatory Visit
Admission: RE | Admit: 2023-06-23 | Discharge: 2023-06-23 | Discharge status: Home / Self Care | Payer: Medicare Other | Source: Ambulatory Visit | Attending: Radiation Oncology

## 2023-06-23 ENCOUNTER — Encounter: Payer: Self-pay | Admitting: Radiation Oncology

## 2023-06-23 VITALS — BP 148/81 | HR 81 | Temp 97.8°F | Resp 18 | Ht 63.0 in | Wt 119.0 lb

## 2023-06-23 DIAGNOSIS — Z87891 Personal history of nicotine dependence: Secondary | ICD-10-CM | POA: Diagnosis not present

## 2023-06-23 DIAGNOSIS — C3412 Malignant neoplasm of upper lobe, left bronchus or lung: Secondary | ICD-10-CM | POA: Diagnosis not present

## 2023-06-23 DIAGNOSIS — C7951 Secondary malignant neoplasm of bone: Secondary | ICD-10-CM | POA: Diagnosis not present

## 2023-06-23 DIAGNOSIS — C679 Malignant neoplasm of bladder, unspecified: Secondary | ICD-10-CM | POA: Insufficient documentation

## 2023-06-23 LAB — CYTOLOGY - NON PAP

## 2023-06-23 NOTE — Progress Notes (Signed)
 Radiation Oncology         [phone]) [phone] ________________________________  Initial outpatient Consultation  Name: Evelyn Jordan MRN: [id number]  Date of Service: 06/23/2023 DOB: 10-12-1950  CC:Evelyn Lavern CROME, MD  Devere Lonni Fire*   REFERRING PHYSICIAN: Devere Lonni Fire*  DIAGNOSIS: 73 y/o woman with with painful osseous metastases at T12 and the left sacrum secondary to metastatic urothelial cell carcinoma, pT4 with squamous cell component    ICD-10-CM   1. Primary malignant neoplasm of left upper lobe of lung (HCC)  C34.12     2. 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 female seen at the request of Dr. Devere. She has a history of Stage IIIB NSCLC, squamous cell carcinoma of the left lung, diagnosed in 07/2022. She was treated with concurrent chemoradiation with weekly carboplatin  and paclitaxel  under the care of Dr. Sherrod and Dr. Dewey, completed at the end of 09/2022. She then received consolidation immunotherapy with Imfinzi , but this was discontinued after 2 cycles due to significant pneumonitis. She has been on observation with Dr. Sherrod since that time.  More recently, she presented to her PCP on 04/30/23 with worsening RLQ abdominal pain. She underwent CT A/P the following day which showed a 2.3 cm necrotic lymph node in the retrocaval space displacing the right renal artery and IVC anteriorly, causing abnormal perfusion in upper and mid right kidney with perinephric edema/inflammation, as well as wall thickening in the renal pelvis and an indeterminate 6 mm RLL pulmonary nodule, new from prior CT in 02/2023. She was urgently referred to urology, but in the interim, she developed hematuria with clots and presented to the ED on 05/09/23. She underwent a repeat CT A/P in the ED showing a 6.1 cm area of hypoenhancement in the posterior mid to lower right kidney with increased  appearance of central liquefaction/necrosis as compared to 8 days ago, favored to reflect a developing renal abscess versus mass. There was a stable 2.8 cm centrally necrotic retrocaval lymph node and 3 lung nodules in the lower lobes, largest measuring 6 mm in the right lower lobe, concerning for metastases.  There was no obvious fluid collection or abscess for drainage so she was discharged home on antibiotics and a planned outpatient follow-up with Dr. Devere on 05/12/2023.  She subsequently underwent cystoscopy with right ureteroscopy, right renal pelvis biopsy and right ureteral stent placement on 05/15/23 under the care of of Dr. Devere. Pathology from the right renal pelvis biopsy and washing, as well as bladder washing, all showed malignant cells, with differential including metastatic squamous cell carcinoma vs urothelial carcinoma with squamous component.  She ultimately proceeded to right radical nephroureterectomy on 05/26/23. Final pathology confirmed urothelial carcinoma with squamous cell component (70%), 7.8 cm, extensively involving the renal pelvis and extending through the kidney into the perinephric fat with lymphovascular invasion and a positive margin of resection at the peri soft tissue of the renal vein. The ureteral and other margins were negative.  Given the pulmonary nodules seen on prior CT A/P from 05/09/2023, a CT chest was performed on 05/25/23 showing new/progressive bilateral pulmonary nodules, compatible with widespread pulmonary metastases with radiation changes in the lingula and central LLL, obscuring underlying prior lingular nodule. She proceeded with restaging PET scan on 06/14/22 showing hypermetabolic bilateral pulmonary nodules, retroperitoneal lymph nodes in the upper abdomen, mesenteric nodule in the LUQ, and  osseous metastasis in the spinous process of T12 vertebral body and  the left sacral ala. She also underwent brain MRI on 06/16/22, which was negative for  intracranial metastases. She was taken for bronchoscopy under the care of Dr. Shelah on 06/21/22 and pathology is pending.  She has been kindly referred to us  today to discuss the potential role of radiotherapy in the management of her disease.  PREVIOUS RADIATION THERAPY: Yes  08/25/22 - 10/08/22: Left Lung / 66 Gy in 33 fractions concurrent with chemotherapy [name]  PAST MEDICAL HISTORY:  Past Medical History:  Diagnosis Date   Depression    Fatty liver    Insomnia    Lung cancer (HCC) 07/25/2022   Osteoporosis    Pericardial effusion 03/05/2023   small noted on ECHO   Pulmonary nodule    Thyroid  nodule       PAST SURGICAL HISTORY: Past Surgical History:  Procedure Laterality Date   ANKLE SURGERY Right    BREAST BIOPSY Right    benign 2013   BRONCHIAL BIOPSY  08/04/2022   Procedure: BRONCHIAL BIOPSIES;  Surgeon: Shelah Lamar RAMAN, MD;  Location: [facility] ENDOSCOPY;  Service: Cardiopulmonary;;   BRONCHIAL BRUSHINGS  08/04/2022   Procedure: BRONCHIAL BRUSHINGS;  Surgeon: Shelah Lamar RAMAN, MD;  Location: [facility] ENDOSCOPY;  Service: Cardiopulmonary;;   COLONOSCOPY     CYSTOSCOPY WITH BIOPSY N/A 05/15/2023   Procedure: CYSTOSCOPY WITH RIGHT RENAL PELVIS BIOPSY;  Surgeon: Devere Lonni Righter, MD;  Location: [facility] ORS;  Service: Urology;  Laterality: N/A;   CYSTOSCOPY WITH STENT PLACEMENT Right 05/26/2023   Procedure: CYSTOSCOPY WITH RIGHT STENT PLACEMENT; TUR OF RIGHT URETERAL ORISICE;  Surgeon: Devere Lonni Righter, MD;  Location: [facility] ORS;  Service: Urology;  Laterality: Right;   CYSTOSCOPY/RETROGRADE/URETEROSCOPY N/A 05/15/2023   Procedure: CYSTOSCOPY, RIGHT RETROGRADE, RIGHT URETEROSCOPY, RIGHT STENT PLACEMENT;  Surgeon: Devere Lonni Righter, MD;  Location: [facility] ORS;  Service: Urology;  Laterality: N/A;  30 MINUTES   HEMOSTASIS CONTROL  08/04/2022   Procedure: HEMOSTASIS CONTROL;  Surgeon: Shelah Lamar RAMAN, MD;  Location: [facility] ENDOSCOPY;  Service: Cardiopulmonary;;   IR IMAGING GUIDED PORT  INSERTION  09/04/2022   repair of a torn meniscus Left 05/2018   ROBOT ASSITED LAPAROSCOPIC NEPHROURETERECTOMY Right 05/26/2023   Procedure: XI ROBOT ASSITED LAPAROSCOPIC NEPHROURETERECTOMY;  Surgeon: Devere Lonni Righter, MD;  Location: [facility] ORS;  Service: Urology;  Laterality: Right;  210 MINS FOR CASE   VIDEO BRONCHOSCOPY Left 08/04/2022   Procedure: VIDEO BRONCHOSCOPY WITH FLUORO;  Surgeon: Shelah Lamar RAMAN, MD;  Location: [facility] ENDOSCOPY;  Service: Cardiopulmonary;  Laterality: Left;    FAMILY HISTORY:  Family History  Problem Relation Age of Onset   Alzheimer's disease Mother    Hypertension Father    Stroke Father    Hyperlipidemia Brother    Breast cancer Neg Hx     SOCIAL HISTORY: She is a retired engineer, 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, 12 years in the OR Social History   Socioeconomic History   Marital status: Married    Spouse name: Archie   Number of children: Not on file   Years of education: Not on file   Highest education level: Bachelor's degree (e.g., BA, AB, BS)  Occupational History    Comment: retired engineer, civil (consulting)   Tobacco Use   Smoking status: Former    Current packs/day: 0.00    Average packs/day: 1 pack/day for 41.0 years (41.0 ttl pk-yrs)    Types: Cigarettes    Start date: 53    Quit date: 2012  Years since quitting: 13.0   Smokeless tobacco: Never  Vaping Use   Vaping status: Never Used  Substance and Sexual Activity   Alcohol use: Yes    Comment: Rarely   Drug use: No   Sexual activity: Not on file  Other Topics Concern   Not on file  Social History Narrative   Not on file   Social Drivers of Health   Financial Resource Strain: Low Risk  (04/29/2023)   Overall Financial Resource Strain (CARDIA)    Difficulty of Paying Living Expenses: Not hard at all  Food Insecurity: No Food Insecurity (06/23/2023)   Hunger Vital Sign    Worried About Running Out of Food in the Last Year: Never true    Ran Out of Food in the Last Year: Never true  Transportation Needs: No  Transportation Needs (06/23/2023)   PRAPARE - Administrator, Civil Service (Medical): No    Lack of Transportation (Non-Medical): No  Physical Activity: Sufficiently Active (04/29/2023)   Exercise Vital Sign    Days of Exercise per Week: 4 days    Minutes of Exercise per Session: 40 min  Stress: No Stress Concern Present (04/29/2023)   [employer] of Occupational Health - Occupational Stress Questionnaire    Feeling of Stress : Only a little  Social Connections: Unknown (04/29/2023)   Social Connection and Isolation Panel [NHANES]    Frequency of Communication with Friends and Family: Once a week    Frequency of Social Gatherings with Friends and Family: Once a week    Attends Religious Services: Patient declined    Database Administrator or Organizations: No    Attends Banker Meetings: Never    Marital Status: Married  Catering Manager Violence: Not At Risk (06/23/2023)   Humiliation, Afraid, Rape, and Kick questionnaire    Fear of Current or Ex-Partner: No    Emotionally Abused: No    Physically Abused: No    Sexually Abused: No    ALLERGIES: Emend [fosaprepitant  dimeglumine] and Compazine [prochlorperazine edisylate]  MEDICATIONS:  Current Outpatient Medications  Medication Sig Dispense Refill   acetaminophen  (TYLENOL ) 325 MG tablet Take 2 tablets (650 mg total) by mouth every 6 (six) hours as needed for mild pain (pain score 1-3) (or Fever >/= 101).     albuterol  (VENTOLIN  HFA) 108 (90 Base) MCG/ACT inhaler Inhale 2 puffs into the lungs every 6 (six) hours as needed for wheezing or shortness of breath. (Patient not taking: Reported on 06/16/2023) 8 g 6   ALPRAZolam  (XANAX ) 0.5 MG tablet Take 0.5 mg by mouth daily as needed for anxiety or sleep.     bisacodyl  (DULCOLAX) 5 MG EC tablet Take 10-15 mg by mouth daily as needed for moderate constipation.     ciprofloxacin  (CIPRO ) 500 MG tablet Take 1 tablet (500 mg total) by mouth 2 (two) times daily. 10  tablet 0   cyclobenzaprine  (FLEXERIL ) 5 MG tablet Take 1 tablet (5 mg total) by mouth 3 (three) times daily as needed for muscle spasms. 30 tablet 0   eszopiclone  (LUNESTA ) 2 MG TABS tablet Take 2 mg by mouth at bedtime as needed for sleep. Take immediately before bedtime     gabapentin  (NEURONTIN ) 300 MG capsule Take 300 mg by mouth 2 (two) times daily.     HYDROcodone  bit-homatropine (HYDROMET) 5-1.5 MG/5ML syrup Take 5 mLs by mouth every 6 (six) hours as needed for cough.     Iron, Ferrous Sulfate, 325 (65 Fe) MG  TABS Take 65 mg by mouth daily with breakfast.     levothyroxine  (SYNTHROID ) 100 MCG tablet Take 1 tablet (100 mcg total) by mouth daily at 6 (six) AM. 30 tablet 2   ondansetron  (ZOFRAN ) 8 MG tablet Take 1 tablet (8 mg total) by mouth every 8 (eight) hours as needed for nausea or vomiting. Start after day 3 of chemotherapy if needed 30 tablet 2   oxyCODONE -acetaminophen  (PERCOCET) 5-325 MG tablet Take 1 tablet by mouth every 4 (four) hours as needed. 40 tablet 0   No current facility-administered medications for this encounter.    REVIEW OF SYSTEMS:  On review of systems, the patient reports that she is doing well overall. She denies any chest pain, shortness of breath, cough, fevers, chills, night sweats, or recent unintended weight changes. She denies any bowel or bladder disturbances, and denies abdominal pain, nausea or vomiting. She doe have some mid-low back pain correlating with the osseous metastasis at T12. She also has occasional, mild pain in the left posterior hip and occasional right groin pain with activity. The location of the left posterior hip pain correlates with the left sacral metastasis seen on recent PET imaging. She denies any paraesthesias or focal weakness in the upper or lower extremities. A complete review of systems is obtained and is otherwise negative.    PHYSICAL EXAM:  Wt Readings from Last 3 Encounters:  06/23/23 119 lb (54 kg)  06/22/23 116 lb 12.8 oz  (53 kg)  06/22/23 115 lb (52.2 kg)   Temp Readings from Last 3 Encounters:  06/23/23 97.8 F (36.6 C) (Temporal)  06/22/23 (!) 97.3 F (36.3 C) (Temporal)  06/22/23 98 F (36.7 C)   BP Readings from Last 3 Encounters:  06/23/23 (!) 148/81  06/22/23 124/78  06/22/23 123/76   Pulse Readings from Last 3 Encounters:  06/23/23 81  06/22/23 93  06/22/23 78   Pain Assessment Pain Score: 4  Pain Loc: Back (right groin and back)/10  In general this is a well appearing Caucasian woman in no acute distress. She's alert and oriented x4 and appropriate throughout the examination. Cardiopulmonary assessment is negative for acute distress and she exhibits normal effort.     KPS = 100  100 - Normal; no complaints; no evidence of disease. 90   - Able to carry on normal activity; minor signs or symptoms of disease. 80   - Normal activity with effort; some signs or symptoms of disease. 70   - Cares for self; unable to carry on normal activity or to do active work. 60   - Requires occasional assistance, but is able to care for most of his personal needs. 50   - Requires considerable assistance and frequent medical care. 40   - Disabled; requires special care and assistance. 30   - Severely disabled; hospital admission is indicated although death not imminent. 20   - Very sick; hospital admission necessary; active supportive treatment necessary. 10   - Moribund; fatal processes progressing rapidly. 0     - Dead  Karnofsky DA, Abelmann WH, Craver LS and Burchenal [facility] [phone]) The use of the nitrogen mustards in the palliative treatment of carcinoma: with particular reference to bronchogenic carcinoma Cancer 1 634-56  LABORATORY DATA:  Lab Results  Component Value Date   WBC 2.5 (L) 06/22/2023   HGB 9.3 (L) 06/22/2023   HCT 28.7 (L) 06/22/2023   MCV 91.4 06/22/2023   PLT 167 06/22/2023   Lab Results  Component Value Date  NA 136 06/22/2023   K 4.1 06/22/2023   CL 101 06/22/2023   CO2  27 06/22/2023   Lab Results  Component Value Date   ALT 23 06/22/2023   AST 20 06/22/2023   ALKPHOS 68 06/22/2023   BILITOT 0.3 06/22/2023     RADIOGRAPHY: DG C-ARM BRONCHOSCOPY Result Date: 06/22/2023 C-ARM BRONCHOSCOPY: Fluoroscopy was utilized by the requesting physician.  No radiographic interpretation.   DG C-Arm 1-60 Min-No Report Result Date: 06/22/2023 Fluoroscopy was utilized by the requesting physician.  No radiographic interpretation.   DG Chest Port 1 View Result Date: 06/22/2023 CLINICAL DATA:  Status post bronchoscopy EXAM: PORTABLE CHEST 1 VIEW COMPARISON:  X-ray 12/17/2022.  PET-CT 06/15/2023. FINDINGS: Right IJ chest port in place with tip along the SVC right atrial junction region. There is a left perihilar confluence opacity with some volume loss along left hemithorax, left lower lobe opacity. Areas of nodularity in the left midlung also seen. There is a right lower lobe lateral lung nodule as well. No right-sided pneumothorax or effusion. No right-sided consolidation. Normal cardiopericardial silhouette. Overlapping cardiac leads. Please correlate with the prior PET-CT. IMPRESSION: No pneumothorax post bronchoscopy. Electronically Signed   By: Ranell Bring M.D.   On: 06/22/2023 11:49   NM PET Image Restage (PS) Skull Base to Thigh (F-18 FDG) Result Date: 06/19/2023 CLINICAL DATA:  Subsequent treatment strategy for non-small cell lung cancer. Additional history of high-grade urothelial carcinoma. EXAM: NUCLEAR MEDICINE PET SKULL BASE TO THIGH TECHNIQUE: 5.7 mCi F-18 FDG was injected intravenously. Full-ring PET imaging was performed from the skull base to thigh after the radiotracer. CT data was obtained and used for attenuation correction and anatomic localization. Fasting blood glucose: 95 mg/dl COMPARISON:  FDG PET scan 08/13/2022 CT chest 05/25/2023, CT abdomen pelvis 06/16/2023. FINDINGS: NECK: No hypermetabolic lymph nodes in the neck. Incidental CT findings: Bilateral  hypermetabolic pulmonary nodules. For example superior segment LEFT lower lobe nodule measuring 14 mm (image 59) with SUV max equal 8.1. Hypermetabolic LEFT lower lobe pulmonary nodule imbedded within segmental atelectasis with SUV max equal 6.9 on image 76. Hypermetabolic RIGHT lower lobe nodule measures 10 mm with SUV max equal 7.2 on image 76. No hypermetabolic mediastinal lymph nodes. CHEST: Dense atelectasis/consolidation in the LEFT lower lobe. Incidental CT findings: None. ABDOMEN/PELVIS: large intensely hypermetabolic retroperitoneal lymph node in the upper abdomen at the level of the renal veins measuring 2.9 cm (image 17) with SUV max equal 14.0. Adjacent paraspinal node beneath the upper insertion of the RIGHT psoas with SUV max equal 13.3 (image 117). hypermetabolic aortocaval retroperitoneal node on image 114. Mesenteric nodule in the LEFT upper quadrant measuring 16 mm on image 132 with SUV max 8.3. Incidental CT findings: Solitary LEFT kidney. Post RIGHT nephrectomy. Bladder normal. SKELETON: RIGHT subtle focus of metabolic activity LEFT iliac wing with SUV max equal 4.8 on image 150. No CT correlation. Larger/intense radiotracer activity in the spinous process of the T12 vertebral body with SUV max equal 13.7 on image 101. There is subtle bone destruction at this level. Incidental CT findings: None. IMPRESSION: 1. Hypermetabolic bilateral pulmonary nodules consistent with pulmonary metastasis. 2. Hypermetabolic retroperitoneal lymph nodes in the upper abdomen consistent with metastatic adenopathy. 3. Hypermetabolic mesenteric nodule in the LEFT upper quadrant consistent with metastatic adenopathy. 4. Hypermetabolic osseous metastasis in the spinous process of the T12 vertebral body and LEFT sacral ala. 5. Solitary LEFT kidney post RIGHT nephrectomy Electronically Signed   By: Jackquline Boxer M.D.   On: 06/19/2023 14:25  MR Brain W Wo Contrast Result Date: 06/17/2023 CLINICAL DATA:  Metastatic  disease evaluation Newly diagnosed stage IV either lung or urothelial cancer restaging EXAM: MRI HEAD WITHOUT AND WITH CONTRAST TECHNIQUE: Multiplanar, multiecho pulse sequences of the brain and surrounding structures were obtained without and with intravenous contrast. CONTRAST:  6mL GADAVIST  GADOBUTROL  1 MMOL/ML IV SOLN COMPARISON:  MRI head August 18, 2022. FINDINGS: Brain: No evidence of acute infarct, acute hemorrhage, midline shift or hydrocephalus. Unchanged 1.3 cm enhancing extra-axial dural-based lesion along the right frontal convexity, compatible with a meningioma. No pathologic enhancement. Vascular: Major arterial flow voids are maintained. Skull and upper cervical spine: Normal marrow signal. Sinuses/Orbits: Clear sinuses.  No acute orbital findings. Other: Trace left mastoid effusion. IMPRESSION: 1. No evidence of acute intracranial abnormality or metastatic disease. 2. Unchanged putative 1.3 cm meningioma along the right frontal convexity without significant mass effect. Electronically Signed   By: Gilmore GORMAN Molt M.D.   On: 06/17/2023 08:15   CT ABDOMEN PELVIS W CONTRAST Result Date: 06/16/2023 CLINICAL DATA:  Right lower quadrant abdominal pain. History of non-small cell lung cancer with metastatic disease and urothelial cancer. EXAM: CT ABDOMEN AND PELVIS WITH CONTRAST TECHNIQUE: Multidetector CT imaging of the abdomen and pelvis was performed using the standard protocol following bolus administration of intravenous contrast. RADIATION DOSE REDUCTION: This exam was performed according to the departmental dose-optimization program which includes automated exposure control, adjustment of the mA and/or kV according to patient size and/or use of iterative reconstruction technique. CONTRAST:  100mL OMNIPAQUE  IOHEXOL  300 MG/ML  SOLN COMPARISON:  CT of the chest 05/25/2023. CT abdomen and pelvis 05/09/2023. FINDINGS: Lower chest: Nodular densities in the lung bases measuring up to 5 mm appear  unchanged. There is a calcified nodule in the right lung base, unchanged. Hepatobiliary: No focal liver abnormality is seen. No gallstones, gallbladder wall thickening, or biliary dilatation. Pancreas: Unremarkable. No pancreatic ductal dilatation or surrounding inflammatory changes. Spleen: Normal in size without focal abnormality. Adrenals/Urinary Tract: Right nephrectomy changes are present. Right adrenal gland not seen. The left adrenal gland appears within normal limits. There is vague focal area of hypodensity in the posterior left kidney measuring 2.1 by 2.8 by 1.7 cm. This appears heterogeneous with some mild vascularity. No perinephric fat stranding or hydronephrosis. Bladder is within normal limits. Stomach/Bowel: Stomach is within normal limits. Appendix app is not seen ears normal. No evidence of bowel wall thickening, distention, or inflammatory changes. Vascular/Lymphatic: Aorta is normal in size. There are atherosclerotic calcifications of the aorta. There is a 3.5 x 3.5 by 4.1 cm heterogeneous hypoechoic area in the right retroperitoneum the level of the right kidney which appears similar to the prior study. This likely compresses or invades the IVC. This is slightly increased in size when compared to prior. There is a new rounded soft tissue mass with central hypodensity in the left mesentery measuring 2.1 x 1.9 cm worrisome for a necrotic lymph node. Reproductive: Uterus and bilateral adnexa are unremarkable. Other: There is a small amount of free fluid in the pelvis. There is no focal abdominal wall hernia. There is new intramuscular thickening and edema with central hypodensity in the lower left anterior abdominal wall. This is ill-defined area measuring proximally 1.6 x 2.4 cm image 2/40. Musculoskeletal: Degenerative changes affect the spine and hips. Chronic fractures of the right transverse processes at L3 and L4 appear unchanged. IMPRESSION: 1. New 2.1 cm necrotic lymph node in the left  mesentery worrisome for metastatic disease. 2. New intramuscular  thickening and edema with central hypodensity in the lower left anterior abdominal wall worrisome for metastatic disease. 3. Slight increase in size of the heterogeneous hypoechoic area in the right retroperitoneum at the level of the right kidney. This compresses and possibly invades the IVC. Findings are worrisome for metastatic disease. 4. Vague focal area of hypodensity in the posterior left kidney measuring 2.8 cm worrisome for metastatic disease. Infection not excluded. 5. Small amount of free fluid in the pelvis. 6. Stable nodular densities in the lung bases measuring up to 5 mm. 7. Aortic atherosclerosis. Aortic Atherosclerosis (ICD10-I70.0). Electronically Signed   By: Greig Pique M.D.   On: 06/16/2023 16:35   CT Chest W Contrast Result Date: 06/06/2023 CLINICAL DATA:  Non-small cell lung cancer, for staging EXAM: CT CHEST WITH CONTRAST TECHNIQUE: Multidetector CT imaging of the chest was performed during intravenous contrast administration. RADIATION DOSE REDUCTION: This exam was performed according to the departmental dose-optimization program which includes automated exposure control, adjustment of the mA and/or kV according to patient size and/or use of iterative reconstruction technique. CONTRAST:  75mL OMNIPAQUE  IOHEXOL  300 MG/ML  SOLN COMPARISON:  03/05/2023 FINDINGS: Cardiovascular: The heart is normal in size. Small pericardial effusion. Leftward cardiomediastinal shift. No evidence of thoracic aortic aneurysm. Mild atherosclerotic calcifications of the aortic arch. Moderate three-vessel coronary atherosclerosis. Right chest port terminates in the upper right atrium. Mediastinum/Nodes: No suspicious mediastinal lymphadenopathy. Visualized thyroid  is unremarkable. Lungs/Pleura: Radiation changes with volume loss in the lingula and central left lower lobe. Underlying prior lingular nodule is obscured. 2.8 x 1.3 cm nodular opacity  in the medial left upper lobe abutting the mediastinum (series 2/image 88), new. 12 x 9 mm nodule in the posterior left upper lobe (series 7/image 85), previously 7 x 5 mm. Additional scattered bilateral pulmonary nodules, approximately 20-25 in number, new from prior CT chest and new/progressive from prior CT abdomen/pelvis and cardiac CT. Index nodules include: --4 mm right upper lobe nodule (series 7/image 36) --5 mm medial left upper lobe nodule (series 7/image 47) --8 mm anterior right lower lobe nodule (series 7/image 89) --6 mm posterior right lower lobe nodule (series 7/image 113) Additional calcified granulomata in the right lung, benign. No focal consolidation. Mild centrilobular and paraseptal emphysematous changes, upper lung predominant. Small left pleural effusion.  No pneumothorax. Upper Abdomen: Visualized upper abdomen is grossly unremarkable. Musculoskeletal: Mild degenerative changes of the visualized thoracolumbar spine. IMPRESSION: Radiation changes in the lingula and central left lower lobe. Underlying prior lingular nodule is obscured. New/progressive bilateral pulmonary nodules, as described above, compatible with widespread pulmonary metastases. Small left pleural effusion. Aortic Atherosclerosis (ICD10-I70.0) and Emphysema (ICD10-J43.9). Electronically Signed   By: Pinkie Pebbles M.D.   On: 06/06/2023 01:33   DG C-Arm 1-60 Min-No Report Result Date: 05/26/2023 Fluoroscopy was utilized by the requesting physician.  No radiographic interpretation.      IMPRESSION/PLAN: 1. 73 y.o. 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 We discussed the natural history of Stage IV urothelial carcinoma and general treatment, highlighting the role of radiotherapy in the management of painful osseous  metastatic disease. We discussed the available radiation techniques, and focused on the details and logistics of delivery.  The recommendation is for a 2-week course of daily palliative radiotherapy to the painful metastatic disease at T12 and the left sacrum.  We reviewed the anticipated acute and late sequelae associated with radiation in this setting. The patient was encouraged to ask questions that were answered to her stated satisfaction and she is in agreement to proceed.  She has freely signed written consent to proceed today in the office and a copy of this document will be placed in her medical record.  We will share our discussion with Dr. Devere and Dr. Gatha and proceed with CT simulation/treatment planning at 9 AM on Wednesday, 06/24/2023, in anticipation of beginning her daily treatments on 07/06/2023, when she returns from her upcoming cruise.  We enjoyed meeting her today and look forward to continuing to participate in her care.  We personally spent 70 minutes in this encounter including chart review, reviewing radiological studies, meeting face-to-face with the patient, entering orders, coordinating care and completing documentation.    Sabra MICAEL Rusk, PA-C    [name], MD  [facility]  Radiation Oncology Direct Dial: [phone]  Fax: [phone] [email or web address]  Skype  LinkedIn   This document serves as a record of services personally performed by [name], MD and Sabra Rusk, PA-C. It was created on their behalf by Izetta Neither, a trained medical scribe. The creation of this record is based on the scribe's personal observations and the provider's statements to them. This document has been checked and approved by the attending provider.

## 2023-06-24 ENCOUNTER — Ambulatory Visit
Admission: RE | Admit: 2023-06-24 | Discharge: 2023-06-24 | Disposition: A | Discharge status: Home / Self Care | Payer: Medicare Other | Source: Ambulatory Visit | Attending: Radiation Oncology | Admitting: Radiation Oncology

## 2023-06-24 ENCOUNTER — Telehealth: Payer: Self-pay | Admitting: Medical Oncology

## 2023-06-24 DIAGNOSIS — Z510 Encounter for antineoplastic radiation therapy: Secondary | ICD-10-CM | POA: Diagnosis not present

## 2023-06-24 DIAGNOSIS — C7951 Secondary malignant neoplasm of bone: Secondary | ICD-10-CM

## 2023-06-24 DIAGNOSIS — Z87891 Personal history of nicotine dependence: Secondary | ICD-10-CM | POA: Diagnosis not present

## 2023-06-24 DIAGNOSIS — C3412 Malignant neoplasm of upper lobe, left bronchus or lung: Secondary | ICD-10-CM | POA: Diagnosis not present

## 2023-06-24 DIAGNOSIS — Z5111 Encounter for antineoplastic chemotherapy: Secondary | ICD-10-CM | POA: Diagnosis not present

## 2023-06-24 DIAGNOSIS — D709 Neutropenia, unspecified: Secondary | ICD-10-CM | POA: Diagnosis not present

## 2023-06-24 DIAGNOSIS — Z5189 Encounter for other specified aftercare: Secondary | ICD-10-CM | POA: Diagnosis not present

## 2023-06-24 DIAGNOSIS — C651 Malignant neoplasm of right renal pelvis: Secondary | ICD-10-CM | POA: Diagnosis not present

## 2023-06-24 NOTE — Telephone Encounter (Signed)
 Pt requests refill for percocet. "I saw Dr. Lorri Rota today and he will start radiation therapy for T12 and left iliac for pain relief. He said it would be ok to continue Percocet for pain management while on my cruise January 16 to 25, 2025. Please refill my prescription prior to Friday at 0400. We leave to drive to [facility] at that time. Thank you ".

## 2023-06-24 NOTE — Progress Notes (Signed)
  Radiation Oncology         [phone] ________________________________  Name: Evelyn Jordan MRN: [id number]  Date: 06/24/2023  DOB: 07-11-1950  SIMULATION AND TREATMENT PLANNING NOTE    ICD-10-CM   1. 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  Identity was confirmed.  All relevant records and images related to the planned course of therapy were reviewed.  The patient freely provided informed written consent to proceed with treatment after reviewing the details related to the planned course of therapy. The consent form was witnessed and verified by the simulation staff.  Then, the patient was set-up in a stable reproducible  supine position for radiation therapy.  CT images were obtained.  Surface markings were placed.  The CT images were loaded into the planning software.  Then the target and avoidance structures were contoured.  Treatment planning then occurred.  The radiation prescription was entered and confirmed.  Then, I designed and supervised the construction of a total of 3 medically necessary complex treatment devices consisting of leg positioner and MLC apertures to cover the treated areas T12 and sacrum.  I have requested : 3D Simulation  I have requested a DVH of the following structures: lungs, spinal cord, heart, esophagus and target.  PLAN:  The patient will receive 30 Gy in 10 fractions to the painful sites of metastases in the left sacrum and T12 spinous process.  ________________________________  Artist Pais Kathrynn Running, M.D.

## 2023-06-25 ENCOUNTER — Telehealth: Payer: Self-pay | Admitting: Emergency Medicine

## 2023-06-25 ENCOUNTER — Encounter (HOSPITAL_COMMUNITY): Payer: Self-pay | Admitting: Emergency Medicine

## 2023-06-25 DIAGNOSIS — R310 Gross hematuria: Secondary | ICD-10-CM | POA: Diagnosis not present

## 2023-06-25 DIAGNOSIS — C651 Malignant neoplasm of right renal pelvis: Secondary | ICD-10-CM | POA: Diagnosis not present

## 2023-06-25 DIAGNOSIS — R1030 Lower abdominal pain, unspecified: Secondary | ICD-10-CM | POA: Diagnosis not present

## 2023-06-25 DIAGNOSIS — M6289 Other specified disorders of muscle: Secondary | ICD-10-CM | POA: Diagnosis not present

## 2023-06-25 DIAGNOSIS — M62838 Other muscle spasm: Secondary | ICD-10-CM | POA: Diagnosis not present

## 2023-06-25 NOTE — Telephone Encounter (Signed)
I discussed bronchoscopy results with the patient by phone.  Her left upper lobe pulmonary nodule was positive for squamous cell cancer, question primary lung.  I suspect that this represents metastatic urothelial cell cancer since there was a squamous component (70%) to her pathology from 05/26/2023.  The right sided lesion was smaller, negative on biopsy.  She is going to follow-up with Dr. Arbutus Ped, Dr. Kathrynn Running.  I think we can cancel her pulmonary function testing and her follow-up visit with RB.  She is going to make that call and cancel

## 2023-06-29 ENCOUNTER — Telehealth: Payer: Self-pay

## 2023-06-29 ENCOUNTER — Encounter (HOSPITAL_COMMUNITY): Payer: Self-pay

## 2023-06-29 NOTE — Telephone Encounter (Signed)
Notified patient's daughter via email that her FMLA had been completed. Documents also emailed to daughter per request.

## 2023-06-30 ENCOUNTER — Ambulatory Visit: Payer: Medicare Other | Admitting: Acute Care

## 2023-07-02 DIAGNOSIS — C7951 Secondary malignant neoplasm of bone: Secondary | ICD-10-CM | POA: Diagnosis not present

## 2023-07-02 DIAGNOSIS — C3412 Malignant neoplasm of upper lobe, left bronchus or lung: Secondary | ICD-10-CM | POA: Diagnosis not present

## 2023-07-02 DIAGNOSIS — Z5111 Encounter for antineoplastic chemotherapy: Secondary | ICD-10-CM | POA: Diagnosis not present

## 2023-07-02 DIAGNOSIS — Z5189 Encounter for other specified aftercare: Secondary | ICD-10-CM | POA: Diagnosis not present

## 2023-07-02 DIAGNOSIS — Z87891 Personal history of nicotine dependence: Secondary | ICD-10-CM | POA: Diagnosis not present

## 2023-07-02 DIAGNOSIS — Z510 Encounter for antineoplastic radiation therapy: Secondary | ICD-10-CM | POA: Diagnosis not present

## 2023-07-02 DIAGNOSIS — D709 Neutropenia, unspecified: Secondary | ICD-10-CM | POA: Diagnosis not present

## 2023-07-06 ENCOUNTER — Inpatient Hospital Stay: Payer: Medicare Other

## 2023-07-06 ENCOUNTER — Other Ambulatory Visit: Payer: Self-pay

## 2023-07-06 ENCOUNTER — Ambulatory Visit
Admission: RE | Admit: 2023-07-06 | Discharge: 2023-07-06 | Disposition: A | Discharge status: Home / Self Care | Payer: Medicare Other | Source: Ambulatory Visit | Attending: Radiation Oncology

## 2023-07-06 ENCOUNTER — Inpatient Hospital Stay (HOSPITAL_BASED_OUTPATIENT_CLINIC_OR_DEPARTMENT_OTHER): Payer: Medicare Other | Admitting: Internal Medicine

## 2023-07-06 VITALS — BP 154/88 | HR 85 | Temp 98.3°F | Resp 16 | Ht 63.0 in | Wt 117.2 lb

## 2023-07-06 VITALS — BP 184/74 | HR 81 | Resp 18

## 2023-07-06 DIAGNOSIS — Z510 Encounter for antineoplastic radiation therapy: Secondary | ICD-10-CM | POA: Diagnosis not present

## 2023-07-06 DIAGNOSIS — Z95828 Presence of other vascular implants and grafts: Secondary | ICD-10-CM

## 2023-07-06 DIAGNOSIS — Z5189 Encounter for other specified aftercare: Secondary | ICD-10-CM | POA: Diagnosis not present

## 2023-07-06 DIAGNOSIS — Z5111 Encounter for antineoplastic chemotherapy: Secondary | ICD-10-CM | POA: Diagnosis not present

## 2023-07-06 DIAGNOSIS — C679 Malignant neoplasm of bladder, unspecified: Secondary | ICD-10-CM | POA: Diagnosis not present

## 2023-07-06 DIAGNOSIS — C7800 Secondary malignant neoplasm of unspecified lung: Secondary | ICD-10-CM

## 2023-07-06 DIAGNOSIS — C7951 Secondary malignant neoplasm of bone: Secondary | ICD-10-CM | POA: Diagnosis not present

## 2023-07-06 DIAGNOSIS — Z87891 Personal history of nicotine dependence: Secondary | ICD-10-CM | POA: Diagnosis not present

## 2023-07-06 DIAGNOSIS — D709 Neutropenia, unspecified: Secondary | ICD-10-CM | POA: Diagnosis not present

## 2023-07-06 DIAGNOSIS — C3412 Malignant neoplasm of upper lobe, left bronchus or lung: Secondary | ICD-10-CM | POA: Diagnosis not present

## 2023-07-06 LAB — RAD ONC ARIA SESSION SUMMARY

## 2023-07-06 LAB — CBC WITH DIFFERENTIAL (CANCER CENTER ONLY)
Abs Immature Granulocytes: 0.01 10*3/uL (ref 0.00–0.07)
Basophils Absolute: 0 10*3/uL (ref 0.0–0.1)
Basophils Relative: 1 %
Eosinophils Absolute: 0 10*3/uL (ref 0.0–0.5)
Eosinophils Relative: 3 %
HCT: 25.9 % — ABNORMAL LOW (ref 36.0–46.0)
Hemoglobin: 8.4 g/dL — ABNORMAL LOW (ref 12.0–15.0)
Immature Granulocytes: 1 %
Lymphocytes Relative: 18 %
Lymphs Abs: 0.2 10*3/uL — ABNORMAL LOW (ref 0.7–4.0)
MCH: 29.7 pg (ref 26.0–34.0)
MCHC: 32.4 g/dL (ref 30.0–36.0)
MCV: 91.5 fL (ref 80.0–100.0)
Monocytes Absolute: 0.5 10*3/uL (ref 0.1–1.0)
Monocytes Relative: 47 %
Neutro Abs: 0.3 10*3/uL — CL (ref 1.7–7.7)
Neutrophils Relative %: 30 %
Platelet Count: 242 10*3/uL (ref 150–400)
RBC: 2.83 MIL/uL — ABNORMAL LOW (ref 3.87–5.11)
RDW: 14.2 % (ref 11.5–15.5)
WBC Count: 1 10*3/uL — ABNORMAL LOW (ref 4.0–10.5)
nRBC: 0 % (ref 0.0–0.2)

## 2023-07-06 LAB — CMP (CANCER CENTER ONLY)
ALT: 16 U/L (ref 0–44)
AST: 16 U/L (ref 15–41)
Albumin: 3.9 g/dL (ref 3.5–5.0)
Alkaline Phosphatase: 62 U/L (ref 38–126)
Anion gap: 7 (ref 5–15)
BUN: 27 mg/dL — ABNORMAL HIGH (ref 8–23)
CO2: 26 mmol/L (ref 22–32)
Calcium: 9.3 mg/dL (ref 8.9–10.3)
Chloride: 106 mmol/L (ref 98–111)
Creatinine: 1.25 mg/dL — ABNORMAL HIGH (ref 0.44–1.00)
GFR, Estimated: 46 mL/min — ABNORMAL LOW (ref >60)
Glucose, Bld: 108 mg/dL — ABNORMAL HIGH (ref 70–99)
Potassium: 4.1 mmol/L (ref 3.5–5.1)
Sodium: 139 mmol/L (ref 135–145)
Total Bilirubin: 0.3 mg/dL (ref 0.0–1.2)
Total Protein: 6.9 g/dL (ref 6.5–8.1)

## 2023-07-06 MED ORDER — SODIUM CHLORIDE 0.9% FLUSH
10.0000 mL | Freq: Once | INTRAVENOUS | Status: AC
  Administered 2023-07-06: 10 mL

## 2023-07-06 MED ORDER — HEPARIN SOD (PORK) LOCK FLUSH 100 UNIT/ML IV SOLN
500.0000 [IU] | Freq: Once | INTRAVENOUS | Status: AC
  Administered 2023-07-06: 500 [IU]

## 2023-07-06 MED ORDER — FILGRASTIM-SNDZ 300 MCG/0.5ML IJ SOSY
300.0000 ug | PREFILLED_SYRINGE | Freq: Every day | INTRAMUSCULAR | Status: DC
  Administered 2023-07-06: 300 ug via SUBCUTANEOUS
  Filled 2023-07-06: qty 0.5

## 2023-07-06 NOTE — Progress Notes (Signed)
Patient presents today for  Gemzar and Carboplatin chemotherapy infusion. Patient is in satisfactory condition with no new complaints voiced. Labs reviewed by Dr. Arbutus Ped during the office visit, Patient's WBC 1 and ANC 0.3. Treatment held today and patient to get Zarxio injections.   Patient tolerated Zarxio injection with no complaints voiced.  Site clean and dry with no bruising or swelling noted.  No complaints of pain.  Discharged with vital signs stable and no signs or symptoms of distress noted.

## 2023-07-06 NOTE — Patient Instructions (Signed)
[facility] - A DEPT OF [facility]  Discharge Instructions: Thank you for choosing [facility] to provide your oncology and hematology care.   If you have a lab appointment with the Cancer Center, please go directly to the Cancer Center and check in at the registration area.   Wear comfortable clothing and clothing appropriate for easy access to any Portacath or PICC line.   We strive to give you quality time with your provider. You may need to reschedule your appointment if you arrive late (15 or more minutes).  Arriving late affects you and other patients whose appointments are after yours.  Also, if you miss three or more appointments without notifying the office, you may be dismissed from the clinic at the provider's discretion.      For prescription refill requests, have your pharmacy contact our office and allow 72 hours for refills to be completed.    Today you received the following Zarxio.  Filgrastim Injection What is this medication? FILGRASTIM (fil GRA stim) lowers the risk of infection in people who are receiving chemotherapy. It works by Systems analyst make more white blood cells, which protects your body from infection. It may also be used to help people who have been exposed to high doses of radiation. It can be used to help prepare your body before a stem cell transplant. It works by helping your bone marrow make and release stem cells into the blood. This medicine may be used for other purposes; ask your health care provider or pharmacist if you have questions. COMMON BRAND NAME(S): Neupogen, Nivestym, Nypozi, Releuko, Zarxio What should I tell my care team before I take this medication? They need to know if you have any of these conditions: History of blood diseases, such as sickle cell anemia Kidney disease Recent or ongoing radiation An unusual or allergic reaction to filgrastim, pegfilgrastim, latex, rubber, other  medications, foods, dyes, or preservatives Pregnant or trying to get pregnant Breast-feeding How should I use this medication? This medication is injected under the skin or into a vein. It is usually given by your care team in a hospital or clinic setting. It may be given at home. If you get this medication at home, you will be taught how to prepare and give it. Use exactly as directed. Take it as directed on the prescription label at the same time every day. Keep taking it unless your care team tells you to stop. It is important that you put your used needles and syringes in a special sharps container. Do not put them in a trash can. If you do not have a sharps container, call your pharmacist or care team to get one. This medication comes with INSTRUCTIONS FOR USE. Ask your pharmacist for directions on how to use this medication. Read the information carefully. Talk to your pharmacist or care team if you have questions. Talk to your care team about the use of this medication in children. While it may be prescribed for children for selected conditions, precautions do apply. Overdosage: If you think you have taken too much of this medicine contact a poison control center or emergency room at once. NOTE: This medicine is only for you. Do not share this medicine with others. What if I miss a dose? It is important not to miss any doses. Talk to your care team about what to do if you miss a dose. What may interact with this medication?  Medications that may cause a release of neutrophils, such as lithium This list may not describe all possible interactions. Give your health care provider a list of all the medicines, herbs, non-prescription drugs, or dietary supplements you use. Also tell them if you smoke, drink alcohol, or use illegal drugs. Some items may interact with your medicine. What should I watch for while using this medication? Your condition will be monitored carefully while you are receiving  this medication. You may need bloodwork while taking this medication. Talk to your care team about your risk of cancer. You may be more at risk for certain types of cancer if you take this medication. What side effects may I notice from receiving this medication? Side effects that you should report to your care team as soon as possible: Allergic reactions--skin rash, itching, hives, swelling of the face, lips, tongue, or throat Capillary leak syndrome--stomach or muscle pain, unusual weakness or fatigue, feeling faint or lightheaded, decrease in the amount of urine, swelling of the ankles, hands, or feet, trouble breathing High white blood cell level--fever, fatigue, trouble breathing, night sweats, change in vision, weight loss Inflammation of the aorta--fever, fatigue, back, chest, or stomach pain, severe headache Kidney injury (glomerulonephritis)--decrease in the amount of urine, red or dark brown urine, foamy or bubbly urine, swelling of the ankles, hands, or feet Shortness of breath or trouble breathing Spleen injury--pain in upper left stomach or shoulder Unusual bruising or bleeding Side effects that usually do not require medical attention (report to your care team if they continue or are bothersome): Back pain Bone pain Fatigue Fever Headache Nausea This list may not describe all possible side effects. Call your doctor for medical advice about side effects. You may report side effects to FDA at 1-800-FDA-1088. Where should I keep my medication? Keep out of the reach of children and pets. Keep this medication in the original packaging until you are ready to take it. Protect from light. See product for storage information. Each product may have different instructions. Get rid of any unused medication after the expiration date. To get rid of medications that are no longer needed or have expired: Take the medication to a medications take-back program. Check with your pharmacy or law  enforcement to find a location. If you cannot return the medication, ask your pharmacist or care team how to get rid of this medication safely. NOTE: This sheet is a summary. It may not cover all possible information. If you have questions about this medicine, talk to your doctor, pharmacist, or health care provider.  2024 Elsevier/Gold Standard (2021-10-17 00:00:00)      To help prevent nausea and vomiting after your treatment, we encourage you to take your nausea medication as directed.  BELOW ARE SYMPTOMS THAT SHOULD BE REPORTED IMMEDIATELY: *FEVER GREATER THAN 100.4 F (38 C) OR HIGHER *CHILLS OR SWEATING *NAUSEA AND VOMITING THAT IS NOT CONTROLLED WITH YOUR NAUSEA MEDICATION *UNUSUAL SHORTNESS OF BREATH *UNUSUAL BRUISING OR BLEEDING *URINARY PROBLEMS (pain or burning when urinating, or frequent urination) *BOWEL PROBLEMS (unusual diarrhea, constipation, pain near the anus) TENDERNESS IN MOUTH AND THROAT WITH OR WITHOUT PRESENCE OF ULCERS (sore throat, sores in mouth, or a toothache) UNUSUAL RASH, SWELLING OR PAIN  UNUSUAL VAGINAL DISCHARGE OR ITCHING   Items with * indicate a potential emergency and should be followed up as soon as possible or go to the Emergency Department if any problems should occur.  Please show the CHEMOTHERAPY ALERT CARD or IMMUNOTHERAPY ALERT CARD at check-in to  the Emergency Department and triage nurse.  Should you have questions after your visit or need to cancel or reschedule your appointment, please contact [facility] - A DEPT OF [facility]  Dept: [phone]  and follow the prompts.  Office hours are 8:00 a.m. to 4:30 p.m. Monday - Friday. Please note that voicemails left after 4:00 p.m. may not be returned until the following business day.  We are closed weekends and major holidays. You have access to a nurse at all times for urgent questions. Please call the main number to the clinic Dept: [phone] and follow the  prompts.   For any non-urgent questions, you may also contact your provider using MyChart. We now offer e-Visits for anyone 11 and older to request care online for non-urgent symptoms. For details visit [email or web address].   Also download the MyChart app! Go to the app store, search "MyChart", open the app, select [facility], and log in with your MyChart username and password.

## 2023-07-06 NOTE — Progress Notes (Signed)
[facility] Telephone:([phone]   [phone]  OFFICE PROGRESS NOTE  [name], MD [address] [place] [address]  DIAGNOSIS:  1) Stage IIIB (T3, N2, M0) non-small cell lung cancer, squamous cell carcinoma presented with obstructive left lingular mass with consolidation and collapse of the lingula as well as part of the left upper lobe and likely invasion of the mediastinum diagnosed in February 2024.  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  Biomarker Findings Microsatellite status - MS-Stable Tumor Mutational Burden - 5 Muts/Mb Genomic Findings For a complete list of the genes assayed, please refer to the Appendix. CCND1 amplification FGFR1 amplification MDM2 amplification FGF19 amplification FGF3 amplification FGF4 amplification TP53 Q136* 8 Disease relevant genes with no reportable alterations: ALK, BRAF, EGFR, ERBB2, KRAS, MET, RET, ROS1  PDL1: 5%   PRIOR THERAPY:  1) Concurrent chemoradiation with weekly carboplatin for AUC of 2 and paclitaxel 45 Mg/M2. Status post 7 weeks of treatment.  Last dose was given October 06, 2022 with partial response. 2) Consolidation treatment with immunotherapy with Imfinzi 1500 Mg IV every 4 weeks.  First dose on November 13, 2022.  Status post 2 cycles.  This was discontinued secondary to toxicity and suspicious immunotherapy mediated pneumonitis. 3) Right Kidney and Ureter Nephrectomy under the care of Dr. Sande Brothers. Tumor extensively involves renal pelvis, extends through the kidney into the perinephric fat with lymphovascular invasion and positive margin at the peri soft tissue of the renal vein   CURRENT THERAPY: Carboplatin on day 1 and gemcitabine on days 1 and 8 IV every 3 weeks. First dose expected on 06/15/2023  INTERVAL HISTORY: Evelyn Jordan 73 y.o. female returns to the clinic today for follow-up visit accompanied by her  daughter.Discussed the use of AI scribe software for clinical note transcription with the patient, who gave verbal consent to proceed.  History of Present Illness   The patient, a 73 year old with a history of stage 3B non-small cell lung cancer (squamous cell carcinoma), initially diagnosed in February 2024, underwent concurrent chemoradiation. Consolidation treatment with durvalumab was discontinued after two cycles due to suspected pneumonitis. In December 2024, the patient was diagnosed with metastatic urothelial carcinoma and started on carboplatin and gemcitabine chemotherapy. The patient received one cycle of this regimen and was due to start the second cycle.  The patient reported feeling weak and slow but denied experiencing nausea or vomiting. She recently went on a seven-day cruise, during which she slept for the first three days and felt okay for the remaining four. The patient did not experience any fever or chills during the trip and reported minimal pain. However, the patient's husband contracted COVID-19 during the cruise, and the patient planned to stay with her daughter to avoid potential exposure.  The patient expressed concern about the focus of her radiation treatment, which was primarily aimed at the T12 and left iliac for pain control. She questioned why the right kidney area was not being targeted.  The patient's white blood count was found to be very low, leading to the decision to delay the second cycle of chemotherapy by a week. The patient's hemoglobin was also low, contributing to her feelings of tiredness and shortness of breath.        MEDICAL HISTORY: Past Medical History:  Diagnosis Date   Depression    Fatty liver    Insomnia    Lung cancer (HCC) 07/25/2022  Osteoporosis    Pericardial effusion 03/05/2023   small noted on ECHO   Pulmonary nodule    Thyroid nodule     ALLERGIES:  is allergic to emend [fosaprepitant dimeglumine] and compazine  [prochlorperazine edisylate].  MEDICATIONS:  Current Outpatient Medications  Medication Sig Dispense Refill   acetaminophen (TYLENOL) 325 MG tablet Take 2 tablets (650 mg total) by mouth every 6 (six) hours as needed for mild pain (pain score 1-3) (or Fever >/= 101).     albuterol (VENTOLIN HFA) 108 (90 Base) MCG/ACT inhaler Inhale 2 puffs into the lungs every 6 (six) hours as needed for wheezing or shortness of breath. (Patient not taking: Reported on 06/16/2023) 8 g 6   ALPRAZolam (XANAX) 0.5 MG tablet Take 0.5 mg by mouth daily as needed for anxiety or sleep.     bisacodyl (DULCOLAX) 5 MG EC tablet Take 10-15 mg by mouth daily as needed for moderate constipation.     ciprofloxacin (CIPRO) 500 MG tablet Take 1 tablet (500 mg total) by mouth 2 (two) times daily. 10 tablet 0   cyclobenzaprine (FLEXERIL) 5 MG tablet Take 1 tablet (5 mg total) by mouth 3 (three) times daily as needed for muscle spasms. 30 tablet 0   eszopiclone (LUNESTA) 2 MG TABS tablet Take 2 mg by mouth at bedtime as needed for sleep. Take immediately before bedtime     gabapentin (NEURONTIN) 300 MG capsule Take 300 mg by mouth 2 (two) times daily.     HYDROcodone bit-homatropine (HYDROMET) 5-1.5 MG/5ML syrup Take 5 mLs by mouth every 6 (six) hours as needed for cough.     Iron, Ferrous Sulfate, 325 (65 Fe) MG TABS Take 65 mg by mouth daily with breakfast.     levothyroxine (SYNTHROID) 100 MCG tablet Take 1 tablet (100 mcg total) by mouth daily at 6 (six) AM. 30 tablet 2   ondansetron (ZOFRAN) 8 MG tablet Take 1 tablet (8 mg total) by mouth every 8 (eight) hours as needed for nausea or vomiting. Start after day 3 of chemotherapy if needed 30 tablet 2   oxyCODONE-acetaminophen (PERCOCET) 5-325 MG tablet Take 1 tablet by mouth every 4 (four) hours as needed. 40 tablet 0   No current facility-administered medications for this visit.    SURGICAL HISTORY:  Past Surgical History:  Procedure Laterality Date   ANKLE SURGERY Right     BREAST BIOPSY Right    benign 2013   BRONCHIAL BIOPSY  08/04/2022   Procedure: BRONCHIAL BIOPSIES;  Surgeon: Leslye Peer, MD;  Location: [facility] ENDOSCOPY;  Service: Cardiopulmonary;;   BRONCHIAL BIOPSY  06/22/2023   Procedure: BRONCHIAL BIOPSIES;  Surgeon: Leslye Peer, MD;  Location: [facility] ENDOSCOPY;  Service: Pulmonary;;   BRONCHIAL BRUSHINGS  08/04/2022   Procedure: BRONCHIAL BRUSHINGS;  Surgeon: Leslye Peer, MD;  Location: [facility] ENDOSCOPY;  Service: Cardiopulmonary;;   BRONCHIAL BRUSHINGS  06/22/2023   Procedure: BRONCHIAL BRUSHINGS;  Surgeon: Leslye Peer, MD;  Location: [facility] ENDOSCOPY;  Service: Pulmonary;;   BRONCHIAL NEEDLE ASPIRATION BIOPSY  06/22/2023   Procedure: BRONCHIAL NEEDLE ASPIRATION BIOPSIES;  Surgeon: Leslye Peer, MD;  Location: [facility] ENDOSCOPY;  Service: Pulmonary;;   BRONCHIAL WASHINGS  06/22/2023   Procedure: BRONCHIAL WASHINGS;  Surgeon: Leslye Peer, MD;  Location: [facility] ENDOSCOPY;  Service: Pulmonary;;   COLONOSCOPY     CYSTOSCOPY WITH BIOPSY N/A 05/15/2023   Procedure: CYSTOSCOPY WITH RIGHT RENAL PELVIS BIOPSY;  Surgeon: Rene Paci, MD;  Location: [facility] ORS;  Service: Urology;  Laterality:  N/A;   CYSTOSCOPY WITH STENT PLACEMENT Right 05/26/2023   Procedure: CYSTOSCOPY WITH RIGHT STENT PLACEMENT; TUR OF RIGHT URETERAL ORISICE;  Surgeon: Rene Paci, MD;  Location: [facility] ORS;  Service: Urology;  Laterality: Right;   CYSTOSCOPY/RETROGRADE/URETEROSCOPY N/A 05/15/2023   Procedure: CYSTOSCOPY, RIGHT RETROGRADE, RIGHT URETEROSCOPY, RIGHT STENT PLACEMENT;  Surgeon: Rene Paci, MD;  Location: [facility] ORS;  Service: Urology;  Laterality: N/A;  30 MINUTES   HEMOSTASIS CONTROL  08/04/2022   Procedure: HEMOSTASIS CONTROL;  Surgeon: Leslye Peer, MD;  Location: [facility] ENDOSCOPY;  Service: Cardiopulmonary;;   IR IMAGING GUIDED PORT INSERTION  09/04/2022   repair of a torn meniscus Left 05/2018   ROBOT ASSITED LAPAROSCOPIC NEPHROURETERECTOMY Right  05/26/2023   Procedure: XI ROBOT ASSITED LAPAROSCOPIC NEPHROURETERECTOMY;  Surgeon: Rene Paci, MD;  Location: [facility] ORS;  Service: Urology;  Laterality: Right;  210 MINS FOR CASE   VIDEO BRONCHOSCOPY Left 08/04/2022   Procedure: VIDEO BRONCHOSCOPY WITH FLUORO;  Surgeon: Leslye Peer, MD;  Location: [facility] ENDOSCOPY;  Service: Cardiopulmonary;  Laterality: Left;    REVIEW OF SYSTEMS:  Constitutional: positive for fatigue Eyes: negative Ears, nose, mouth, throat, and face: negative Respiratory: positive for dyspnea on exertion Cardiovascular: negative Gastrointestinal: negative Genitourinary:negative Integument/breast: negative Hematologic/lymphatic: negative Musculoskeletal:negative Neurological: negative Behavioral/Psych: negative Endocrine: negative Allergic/Immunologic: negative   PHYSICAL EXAMINATION: General appearance: alert, cooperative, fatigued, and no distress Head: Normocephalic, without obvious abnormality, atraumatic Neck: no adenopathy, no JVD, supple, symmetrical, trachea midline, and thyroid not enlarged, symmetric, no tenderness/mass/nodules Lymph nodes: Cervical, supraclavicular, and axillary nodes normal. Resp: clear to auscultation bilaterally Back: symmetric, no curvature. ROM normal. No CVA tenderness. Cardio: regular rate and rhythm, S1, S2 normal, no murmur, click, rub or gallop GI: soft, non-tender; bowel sounds normal; no masses,  no organomegaly Extremities: extremities normal, atraumatic, no cyanosis or edema Neurologic: Alert and oriented X 3, normal strength and tone. Normal symmetric reflexes. Normal coordination and gait  ECOG PERFORMANCE STATUS: 1 - Symptomatic but completely ambulatory  Blood pressure (!) 154/88, pulse 85, temperature 98.3 F (36.8 C), temperature source Temporal, resp. rate 16, height 5\' 3"  (1.6 m), weight 117 lb 3.2 oz (53.2 kg), SpO2 100%.  LABORATORY DATA: Lab Results  Component Value Date   WBC 1.0 (L)  07/06/2023   HGB 8.4 (L) 07/06/2023   HCT 25.9 (L) 07/06/2023   MCV 91.5 07/06/2023   PLT 242 07/06/2023      Chemistry      Component Value Date/Time   NA 136 06/22/2023 1310   NA 141 03/07/2015 0000   K 4.1 06/22/2023 1310   CL 101 06/22/2023 1310   CO2 27 06/22/2023 1310   BUN 25 (H) 06/22/2023 1310   CREATININE 1.32 (H) 06/22/2023 1310   GLU 103 03/07/2015 0000      Component Value Date/Time   CALCIUM 9.2 06/22/2023 1310   ALKPHOS 68 06/22/2023 1310   AST 20 06/22/2023 1310   ALT 23 06/22/2023 1310   BILITOT 0.3 06/22/2023 1310       RADIOGRAPHIC STUDIES: DG C-ARM BRONCHOSCOPY Result Date: 06/22/2023 C-ARM BRONCHOSCOPY: Fluoroscopy was utilized by the requesting physician.  No radiographic interpretation.   DG C-Arm 1-60 Min-No Report Result Date: 06/22/2023 Fluoroscopy was utilized by the requesting physician.  No radiographic interpretation.   DG Chest Port 1 View Result Date: 06/22/2023 CLINICAL DATA:  Status post bronchoscopy EXAM: PORTABLE CHEST 1 VIEW COMPARISON:  X-ray 12/17/2022.  PET-CT 06/15/2023. FINDINGS: Right IJ chest port in place with tip along  the SVC right atrial junction region. There is a left perihilar confluence opacity with some volume loss along left hemithorax, left lower lobe opacity. Areas of nodularity in the left midlung also seen. There is a right lower lobe lateral lung nodule as well. No right-sided pneumothorax or effusion. No right-sided consolidation. Normal cardiopericardial silhouette. Overlapping cardiac leads. Please correlate with the prior PET-CT. IMPRESSION: No pneumothorax post bronchoscopy. Electronically Signed   By: Karen Kays M.D.   On: 06/22/2023 11:49   NM PET Image Restage (PS) Skull Base to Thigh (F-18 FDG) Result Date: 06/19/2023 CLINICAL DATA:  Subsequent treatment strategy for non-small cell lung cancer. Additional history of high-grade urothelial carcinoma. EXAM: NUCLEAR MEDICINE PET SKULL BASE TO THIGH TECHNIQUE:  5.7 mCi F-18 FDG was injected intravenously. Full-ring PET imaging was performed from the skull base to thigh after the radiotracer. CT data was obtained and used for attenuation correction and anatomic localization. Fasting blood glucose: 95 mg/dl COMPARISON:  FDG PET scan 08/13/2022 CT chest 05/25/2023, CT abdomen pelvis 06/16/2023. FINDINGS: NECK: No hypermetabolic lymph nodes in the neck. Incidental CT findings: Bilateral hypermetabolic pulmonary nodules. For example superior segment LEFT lower lobe nodule measuring 14 mm (image 59) with SUV max equal 8.1. Hypermetabolic LEFT lower lobe pulmonary nodule imbedded within segmental atelectasis with SUV max equal 6.9 on image 76. Hypermetabolic RIGHT lower lobe nodule measures 10 mm with SUV max equal 7.2 on image 76. No hypermetabolic mediastinal lymph nodes. CHEST: Dense atelectasis/consolidation in the LEFT lower lobe. Incidental CT findings: None. ABDOMEN/PELVIS: large intensely hypermetabolic retroperitoneal lymph node in the upper abdomen at the level of the renal veins measuring 2.9 cm (image 17) with SUV max equal 14.0. Adjacent paraspinal node beneath the upper insertion of the RIGHT psoas with SUV max equal 13.3 (image 117). hypermetabolic aortocaval retroperitoneal node on image 114. Mesenteric nodule in the LEFT upper quadrant measuring 16 mm on image 132 with SUV max 8.3. Incidental CT findings: Solitary LEFT kidney. Post RIGHT nephrectomy. Bladder normal. SKELETON: RIGHT subtle focus of metabolic activity LEFT iliac wing with SUV max equal 4.8 on image 150. No CT correlation. Larger/intense radiotracer activity in the spinous process of the T12 vertebral body with SUV max equal 13.7 on image 101. There is subtle bone destruction at this level. Incidental CT findings: None. IMPRESSION: 1. Hypermetabolic bilateral pulmonary nodules consistent with pulmonary metastasis. 2. Hypermetabolic retroperitoneal lymph nodes in the upper abdomen consistent with  metastatic adenopathy. 3. Hypermetabolic mesenteric nodule in the LEFT upper quadrant consistent with metastatic adenopathy. 4. Hypermetabolic osseous metastasis in the spinous process of the T12 vertebral body and LEFT sacral ala. 5. Solitary LEFT kidney post RIGHT nephrectomy Electronically Signed   By: [name] Bi M.D.   On: 06/19/2023 14:25   MR Brain W Wo Contrast Result Date: 06/17/2023 CLINICAL DATA:  Metastatic disease evaluation Newly diagnosed stage IV either lung or urothelial cancer restaging EXAM: MRI HEAD WITHOUT AND WITH CONTRAST TECHNIQUE: Multiplanar, multiecho pulse sequences of the brain and surrounding structures were obtained without and with intravenous contrast. CONTRAST:  6mL GADAVIST GADOBUTROL 1 MMOL/ML IV SOLN COMPARISON:  MRI head August 18, 2022. FINDINGS: Brain: No evidence of acute infarct, acute hemorrhage, midline shift or hydrocephalus. Unchanged 1.3 cm enhancing extra-axial dural-based lesion along the right frontal convexity, compatible with a meningioma. No pathologic enhancement. Vascular: Major arterial flow voids are maintained. Skull and upper cervical spine: Normal marrow signal. Sinuses/Orbits: Clear sinuses.  No acute orbital findings. Other: Trace left mastoid effusion. IMPRESSION: 1.  No evidence of acute intracranial abnormality or metastatic disease. 2. Unchanged putative 1.3 cm meningioma along the right frontal convexity without significant mass effect. Electronically Signed   By: Feliberto Harts M.D.   On: 06/17/2023 08:15   CT ABDOMEN PELVIS W CONTRAST Result Date: 06/16/2023 CLINICAL DATA:  Right lower quadrant abdominal pain. History of non-small cell lung cancer with metastatic disease and urothelial cancer. EXAM: CT ABDOMEN AND PELVIS WITH CONTRAST TECHNIQUE: Multidetector CT imaging of the abdomen and pelvis was performed using the standard protocol following bolus administration of intravenous contrast. RADIATION DOSE REDUCTION: This exam was  performed according to the departmental dose-optimization program which includes automated exposure control, adjustment of the mA and/or kV according to patient size and/or use of iterative reconstruction technique. CONTRAST:  OMNIPAQUE IOHEXOL 300 MG/ML  SOLN COMPARISON:  CT of the chest 05/25/2023. CT abdomen and pelvis 05/09/2023. FINDINGS: Lower chest: Nodular densities in the lung bases measuring up to 5 mm appear unchanged. There is a calcified nodule in the right lung base, unchanged. Hepatobiliary: No focal liver abnormality is seen. No gallstones, gallbladder wall thickening, or biliary dilatation. Pancreas: Unremarkable. No pancreatic ductal dilatation or surrounding inflammatory changes. Spleen: Normal in size without focal abnormality. Adrenals/Urinary Tract: Right nephrectomy changes are present. Right adrenal gland not seen. The left adrenal gland appears within normal limits. There is vague focal area of hypodensity in the posterior left kidney measuring 2.1 by 2.8 by 1.7 cm. This appears heterogeneous with some mild vascularity. No perinephric fat stranding or hydronephrosis. Bladder is within normal limits. Stomach/Bowel: Stomach is within normal limits. Appendix app is not seen ears normal. No evidence of bowel wall thickening, distention, or inflammatory changes. Vascular/Lymphatic: Aorta is normal in size. There are atherosclerotic calcifications of the aorta. There is a 3.5 x 3.5 by 4.1 cm heterogeneous hypoechoic area in the right retroperitoneum the level of the right kidney which appears similar to the prior study. This likely compresses or invades the IVC. This is slightly increased in size when compared to prior. There is a new rounded soft tissue mass with central hypodensity in the left mesentery measuring 2.1 x 1.9 cm worrisome for a necrotic lymph node. Reproductive: Uterus and bilateral adnexa are unremarkable. Other: There is a small amount of free fluid in the pelvis. There is  no focal abdominal wall hernia. There is new intramuscular thickening and edema with central hypodensity in the lower left anterior abdominal wall. This is ill-defined area measuring proximally 1.6 x 2.4 cm image 2/40. Musculoskeletal: Degenerative changes affect the spine and hips. Chronic fractures of the right transverse processes at L3 and L4 appear unchanged. IMPRESSION: 1. New 2.1 cm necrotic lymph node in the left mesentery worrisome for metastatic disease. 2. New intramuscular thickening and edema with central hypodensity in the lower left anterior abdominal wall worrisome for metastatic disease. 3. Slight increase in size of the heterogeneous hypoechoic area in the right retroperitoneum at the level of the right kidney. This compresses and possibly invades the IVC. Findings are worrisome for metastatic disease. 4. Vague focal area of hypodensity in the posterior left kidney measuring 2.8 cm worrisome for metastatic disease. Infection not excluded. 5. Small amount of free fluid in the pelvis. 6. Stable nodular densities in the lung bases measuring up to 5 mm. 7. Aortic atherosclerosis. Aortic Atherosclerosis (ICD10-I70.0). Electronically Signed   By: Darliss Cheney M.D.   On: 06/16/2023 16:35    ASSESSMENT AND PLAN: This is a very pleasant 73 years old  white female with  1) Stage IIIB (T3, N2, M0) non-small cell lung cancer, squamous cell carcinoma presented with obstructive left lingular mass with consolidation and collapse of the lingula as well as part of the left upper lobe and likely invasion of the mediastinum diagnosed in February 2024.  Molecular studies by foundation 1 showed no actionable mutations PDL1: 5% The patient underwent a course of concurrent chemoradiation with weekly carboplatin for AUC of 2 and paclitaxel 45 Mg/M2 status post 7  cycles.  Last dose was given on October 06, 2022 with partial response. The patient tolerated the previous course of her treatment fairly well with no  concerning adverse effect except for the fatigue and the radiation-induced esophagitis. She had partial response to this treatment. She underwent consolidation treatment with immunotherapy with Imfinzi 1500 Mg IV every 4 weeks status post 2 cycle.  Her treatment was discontinued secondary to toxicity.  The patient has been on observation 2) metastatic urothelial carcinoma with squamous cell differentiation status post right ureteral nephrectomy in December 2024.  She is currently on systemic chemotherapy with carboplatin for AUC of 5 on day 1 and gemcitabine 1000 Mg/M2 on days 1 and 8 every 3 weeks.  She is status post 1 cycle.  Metastatic Urothelial Carcinoma Diagnosed in December 2024. Undergoing chemotherapy with carboplatin and gemcitabine; only one cycle completed due to low WBC. Discussed risks of neutropenic fever and advised against travel. Radiation aimed at T12 and left iliac for pain control, not the right kidney area. - Administer filgrastim 300 mcg [place] daily for three days - Delay chemotherapy by one week - Continue radiation therapy as scheduled - Provide note for cruise cancellation in March  Neutropenia Severe neutropenia with WBC of 1.0 and ANC of 300. High infection risk, especially after recent COVID-19 exposure from spouse. Discussed serious risk of neutropenic fever and advised against travel. - Administer filgrastim 300 mcg [place] daily for three days - Monitor WBC closely - Advise against travel due to risk of neutropenic fever  Stage III B Non-Small Cell Lung Cancer (NSCLC), Squamous Cell Carcinoma Diagnosed in February 2024. Underwent concurrent chemoradiation followed by durvalumab, discontinued after two cycles due to suspected pneumonitis. - Continue monitoring and supportive care  Anemia Hemoglobin level at 8.4, causing fatigue and shortness of breath. No immediate need for transfusion unless hemoglobin drops below 8. - Monitor hemoglobin levels - Consider transfusion  if hemoglobin drops below 8  Follow-up - Reschedule chemotherapy for next week - Provide note for cruise cancellation in March - Coordinate with radiation therapy schedule - Monitor for COVID-19 symptoms and delay treatment if necessary.   The patient was advised to call immediately if she has any other concerning symptoms in the interval.  The patient voices understanding of current disease status and treatment options and is in agreement with the current care plan.  All questions were answered. The patient knows to call the clinic with any problems, questions or concerns. We can certainly see the patient much sooner if necessary.  The total time spent in the appointment was 30 minutes.  Disclaimer: This note was dictated with voice recognition software. Similar sounding words can inadvertently be transcribed and may not be corrected upon review.

## 2023-07-07 ENCOUNTER — Inpatient Hospital Stay: Payer: Medicare Other

## 2023-07-07 ENCOUNTER — Other Ambulatory Visit: Payer: Self-pay

## 2023-07-07 ENCOUNTER — Ambulatory Visit: Payer: Medicare Other | Admitting: Acute Care

## 2023-07-07 ENCOUNTER — Ambulatory Visit
Admission: RE | Admit: 2023-07-07 | Discharge: 2023-07-07 | Disposition: A | Discharge status: Home / Self Care | Payer: Medicare Other | Source: Ambulatory Visit | Attending: Radiation Oncology | Admitting: Radiation Oncology

## 2023-07-07 VITALS — BP 124/78 | HR 88 | Temp 98.2°F | Resp 18

## 2023-07-07 DIAGNOSIS — C3412 Malignant neoplasm of upper lobe, left bronchus or lung: Secondary | ICD-10-CM | POA: Diagnosis not present

## 2023-07-07 DIAGNOSIS — Z5189 Encounter for other specified aftercare: Secondary | ICD-10-CM | POA: Diagnosis not present

## 2023-07-07 DIAGNOSIS — C7951 Secondary malignant neoplasm of bone: Secondary | ICD-10-CM | POA: Diagnosis not present

## 2023-07-07 DIAGNOSIS — Z95828 Presence of other vascular implants and grafts: Secondary | ICD-10-CM

## 2023-07-07 DIAGNOSIS — Z510 Encounter for antineoplastic radiation therapy: Secondary | ICD-10-CM | POA: Diagnosis not present

## 2023-07-07 DIAGNOSIS — D709 Neutropenia, unspecified: Secondary | ICD-10-CM | POA: Diagnosis not present

## 2023-07-07 DIAGNOSIS — Z87891 Personal history of nicotine dependence: Secondary | ICD-10-CM | POA: Diagnosis not present

## 2023-07-07 DIAGNOSIS — Z5111 Encounter for antineoplastic chemotherapy: Secondary | ICD-10-CM | POA: Diagnosis not present

## 2023-07-07 LAB — RAD ONC ARIA SESSION SUMMARY

## 2023-07-07 MED ORDER — FILGRASTIM-SNDZ 300 MCG/0.5ML IJ SOSY
300.0000 ug | PREFILLED_SYRINGE | Freq: Every day | INTRAMUSCULAR | Status: DC
  Administered 2023-07-07: 300 ug via SUBCUTANEOUS
  Filled 2023-07-07: qty 0.5

## 2023-07-08 ENCOUNTER — Inpatient Hospital Stay: Payer: Medicare Other

## 2023-07-08 ENCOUNTER — Ambulatory Visit
Admission: RE | Admit: 2023-07-08 | Discharge: 2023-07-08 | Disposition: A | Discharge status: Home / Self Care | Payer: Medicare Other | Source: Ambulatory Visit | Attending: Radiation Oncology | Admitting: Radiation Oncology

## 2023-07-08 ENCOUNTER — Other Ambulatory Visit: Payer: Self-pay

## 2023-07-08 VITALS — BP 121/75 | HR 113 | Temp 98.7°F | Resp 16

## 2023-07-08 DIAGNOSIS — Z510 Encounter for antineoplastic radiation therapy: Secondary | ICD-10-CM | POA: Diagnosis not present

## 2023-07-08 DIAGNOSIS — C7951 Secondary malignant neoplasm of bone: Secondary | ICD-10-CM | POA: Diagnosis not present

## 2023-07-08 DIAGNOSIS — D709 Neutropenia, unspecified: Secondary | ICD-10-CM | POA: Diagnosis not present

## 2023-07-08 DIAGNOSIS — Z95828 Presence of other vascular implants and grafts: Secondary | ICD-10-CM

## 2023-07-08 DIAGNOSIS — Z5189 Encounter for other specified aftercare: Secondary | ICD-10-CM | POA: Diagnosis not present

## 2023-07-08 DIAGNOSIS — Z87891 Personal history of nicotine dependence: Secondary | ICD-10-CM | POA: Diagnosis not present

## 2023-07-08 DIAGNOSIS — C3412 Malignant neoplasm of upper lobe, left bronchus or lung: Secondary | ICD-10-CM | POA: Diagnosis not present

## 2023-07-08 DIAGNOSIS — Z5111 Encounter for antineoplastic chemotherapy: Secondary | ICD-10-CM | POA: Diagnosis not present

## 2023-07-08 LAB — RAD ONC ARIA SESSION SUMMARY

## 2023-07-08 MED ORDER — FILGRASTIM-SNDZ 300 MCG/0.5ML IJ SOSY
300.0000 ug | PREFILLED_SYRINGE | Freq: Every day | INTRAMUSCULAR | Status: DC
  Administered 2023-07-08: 300 ug via SUBCUTANEOUS
  Filled 2023-07-08: qty 0.5

## 2023-07-09 ENCOUNTER — Ambulatory Visit: Payer: Medicare Other | Admitting: Emergency Medicine

## 2023-07-09 ENCOUNTER — Other Ambulatory Visit: Payer: Self-pay

## 2023-07-09 ENCOUNTER — Ambulatory Visit
Admission: RE | Admit: 2023-07-09 | Discharge: 2023-07-09 | Disposition: A | Discharge status: Home / Self Care | Payer: Medicare Other | Source: Ambulatory Visit | Attending: Radiation Oncology | Admitting: Radiation Oncology

## 2023-07-09 DIAGNOSIS — C7951 Secondary malignant neoplasm of bone: Secondary | ICD-10-CM | POA: Diagnosis not present

## 2023-07-09 DIAGNOSIS — Z510 Encounter for antineoplastic radiation therapy: Secondary | ICD-10-CM | POA: Diagnosis not present

## 2023-07-09 DIAGNOSIS — Z87891 Personal history of nicotine dependence: Secondary | ICD-10-CM | POA: Diagnosis not present

## 2023-07-09 DIAGNOSIS — D709 Neutropenia, unspecified: Secondary | ICD-10-CM | POA: Diagnosis not present

## 2023-07-09 DIAGNOSIS — Z5111 Encounter for antineoplastic chemotherapy: Secondary | ICD-10-CM | POA: Diagnosis not present

## 2023-07-09 DIAGNOSIS — C3412 Malignant neoplasm of upper lobe, left bronchus or lung: Secondary | ICD-10-CM | POA: Diagnosis not present

## 2023-07-09 DIAGNOSIS — Z5189 Encounter for other specified aftercare: Secondary | ICD-10-CM | POA: Diagnosis not present

## 2023-07-09 LAB — RAD ONC ARIA SESSION SUMMARY

## 2023-07-10 ENCOUNTER — Encounter (HOSPITAL_COMMUNITY): Payer: Self-pay | Admitting: Internal Medicine

## 2023-07-10 ENCOUNTER — Other Ambulatory Visit: Payer: Self-pay

## 2023-07-10 ENCOUNTER — Ambulatory Visit
Admission: RE | Admit: 2023-07-10 | Discharge: 2023-07-10 | Disposition: A | Discharge status: Home / Self Care | Payer: Medicare Other | Source: Ambulatory Visit | Attending: Radiation Oncology

## 2023-07-10 DIAGNOSIS — Z5111 Encounter for antineoplastic chemotherapy: Secondary | ICD-10-CM | POA: Diagnosis not present

## 2023-07-10 DIAGNOSIS — Z5189 Encounter for other specified aftercare: Secondary | ICD-10-CM | POA: Diagnosis not present

## 2023-07-10 DIAGNOSIS — C679 Malignant neoplasm of bladder, unspecified: Secondary | ICD-10-CM | POA: Diagnosis not present

## 2023-07-10 DIAGNOSIS — C3412 Malignant neoplasm of upper lobe, left bronchus or lung: Secondary | ICD-10-CM | POA: Diagnosis not present

## 2023-07-10 DIAGNOSIS — C7951 Secondary malignant neoplasm of bone: Secondary | ICD-10-CM | POA: Diagnosis not present

## 2023-07-10 DIAGNOSIS — D709 Neutropenia, unspecified: Secondary | ICD-10-CM | POA: Diagnosis not present

## 2023-07-10 DIAGNOSIS — Z510 Encounter for antineoplastic radiation therapy: Secondary | ICD-10-CM | POA: Diagnosis not present

## 2023-07-10 DIAGNOSIS — Z87891 Personal history of nicotine dependence: Secondary | ICD-10-CM | POA: Diagnosis not present

## 2023-07-10 LAB — RAD ONC ARIA SESSION SUMMARY

## 2023-07-10 NOTE — Progress Notes (Addendum)
 Palliative Medicine [facility]  [phone] [phone]   Name: GILMA BESSETTE Date: 07/10/2023 MRN: [id number]  DOB: 1951/03/16  Patient Care Team: [name], MD as PCP - General (Family Medicine) Addie, Cordella Hamilton, MD as Consulting Physician (Orthopedic Surgery) Vicci Hadassah RAMAN, OD as Consulting Physician (Ophthalmology) Shelah Lamar RAMAN, MD as Consulting Physician (Pulmonary Disease) Sherrod Sherrod, MD as Consulting Physician (Oncology)    REASON FOR CONSULTATION: Evelyn Jordan is a 73 y.o. 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 Palliative ask to see for symptom management and goals of care.    SOCIAL HISTORY:     reports that she quit smoking about 13 years ago. Her smoking use included cigarettes. She started smoking about 54 years ago. She has a 41 pack-year smoking history. She has never used smokeless tobacco. She reports current alcohol use. She reports that she does not use drugs.  ADVANCE DIRECTIVES:  Advanced directives on file naming Rosaline Plan and Tinnie Hurst as the primary and secondary decision makers should the patient be unable to make her own decisions.   CODE STATUS: Full code  PAST MEDICAL HISTORY: Past Medical History:  Diagnosis Date   Depression    Fatty liver    Insomnia    Lung cancer (HCC) 07/25/2022   Osteoporosis    Pericardial effusion 03/05/2023   small noted on ECHO   Pulmonary nodule    Thyroid  nodule     PAST SURGICAL HISTORY:  Past Surgical History:  Procedure Laterality Date   ANKLE SURGERY Right    BREAST BIOPSY Right    benign 2013   BRONCHIAL BIOPSY  08/04/2022   Procedure: BRONCHIAL BIOPSIES;  Surgeon: Shelah Lamar RAMAN, MD;  Location: [facility] ENDOSCOPY;  Service: Cardiopulmonary;;   BRONCHIAL BIOPSY  06/22/2023   Procedure: BRONCHIAL BIOPSIES;  Surgeon: Shelah Lamar RAMAN, MD;  Location: [facility]  ENDOSCOPY;  Service: Pulmonary;;   BRONCHIAL BRUSHINGS  08/04/2022   Procedure: BRONCHIAL BRUSHINGS;  Surgeon: Shelah Lamar RAMAN, MD;  Location: [facility] ENDOSCOPY;  Service: Cardiopulmonary;;   BRONCHIAL BRUSHINGS  06/22/2023   Procedure: BRONCHIAL BRUSHINGS;  Surgeon: Shelah Lamar RAMAN, MD;  Location: [facility] ENDOSCOPY;  Service: Pulmonary;;   BRONCHIAL NEEDLE ASPIRATION BIOPSY  06/22/2023   Procedure: BRONCHIAL NEEDLE ASPIRATION BIOPSIES;  Surgeon: Shelah Lamar RAMAN, MD;  Location: [facility] ENDOSCOPY;  Service: Pulmonary;;   BRONCHIAL WASHINGS  06/22/2023   Procedure: BRONCHIAL WASHINGS;  Surgeon: Shelah Lamar RAMAN, MD;  Location: [facility] ENDOSCOPY;  Service: Pulmonary;;   COLONOSCOPY     CYSTOSCOPY WITH BIOPSY N/A 05/15/2023   Procedure: CYSTOSCOPY WITH RIGHT RENAL PELVIS BIOPSY;  Surgeon: Devere Lonni Righter, MD;  Location: [facility] ORS;  Service: Urology;  Laterality: N/A;   CYSTOSCOPY WITH STENT PLACEMENT Right 05/26/2023   Procedure: CYSTOSCOPY WITH RIGHT STENT PLACEMENT; TUR OF RIGHT URETERAL ORISICE;  Surgeon: Devere Lonni Righter, MD;  Location: [facility] ORS;  Service: Urology;  Laterality: Right;   CYSTOSCOPY/RETROGRADE/URETEROSCOPY N/A 05/15/2023   Procedure: CYSTOSCOPY, RIGHT RETROGRADE, RIGHT URETEROSCOPY, RIGHT STENT PLACEMENT;  Surgeon: Devere Lonni Righter, MD;  Location: [facility] ORS;  Service: Urology;  Laterality: N/A;  30 MINUTES   HEMOSTASIS CONTROL  08/04/2022   Procedure: HEMOSTASIS CONTROL;  Surgeon: Shelah Lamar RAMAN, MD;  Location: [facility] ENDOSCOPY;  Service: Cardiopulmonary;;   IR IMAGING GUIDED PORT INSERTION  09/04/2022   repair of a torn meniscus Left 05/2018   ROBOT ASSITED LAPAROSCOPIC NEPHROURETERECTOMY Right 05/26/2023  Procedure: XI ROBOT ASSITED LAPAROSCOPIC NEPHROURETERECTOMY;  Surgeon: Devere Lonni Righter, MD;  Location: [facility] ORS;  Service: Urology;  Laterality: Right;  210 MINS FOR CASE   VIDEO BRONCHOSCOPY Left 08/04/2022   Procedure: VIDEO BRONCHOSCOPY WITH FLUORO;  Surgeon: Shelah Lamar RAMAN, MD;  Location: [facility] ENDOSCOPY;  Service: Cardiopulmonary;  Laterality: Left;    HEMATOLOGY/ONCOLOGY HISTORY:  Oncology History  Malignant neoplasm of unspecified part of unspecified bronchus or lung (HCC)  08/11/2022 Initial Diagnosis   Malignant neoplasm of unspecified part of unspecified bronchus or lung (HCC)   08/11/2022 Cancer Staging   Staging form: Lung, AJCC 8th Edition - Clinical: Stage IIIB (cT3, cN2, cM0) - Signed by Sherrod Sherrod, MD on 08/11/2022   08/18/2022 - 10/06/2022 Chemotherapy   Patient is on Treatment Plan : LUNG Carboplatin  + Paclitaxel  + XRT q7d     11/13/2022 - 12/10/2022 Chemotherapy   Patient is on Treatment Plan : LUNG NSCLC Durvalumab  (1500) q28d     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  MEDICATIONS:  Current Outpatient Medications  Medication Sig Dispense Refill   acetaminophen  (TYLENOL ) 325 MG tablet Take 2 tablets (650 mg total) by mouth every 6 (six) hours as needed for mild pain (pain score 1-3) (or Fever >/= 101).     albuterol  (VENTOLIN  HFA) 108 (90 Base) MCG/ACT inhaler Inhale 2 puffs into the lungs every 6 (six) hours as needed for wheezing or shortness of breath. (Patient not taking: Reported on 06/16/2023) 8 g 6   ALPRAZolam  (XANAX ) 0.5 MG tablet Take 0.5 mg by mouth daily as needed for anxiety or sleep.     bisacodyl  (DULCOLAX) 5 MG EC tablet Take 10-15 mg by mouth daily as needed for moderate constipation.     ciprofloxacin  (CIPRO ) 500 MG tablet Take 1 tablet (500 mg total) by mouth 2 (two)  times daily. 10 tablet 0   cyclobenzaprine  (FLEXERIL ) 5 MG tablet Take 1 tablet (5 mg total) by mouth 3 (three) times daily as needed for muscle spasms. 30 tablet 0   eszopiclone  (LUNESTA ) 2 MG TABS tablet Take 2 mg by mouth at bedtime as needed for sleep. Take immediately before bedtime     gabapentin  (NEURONTIN ) 300 MG capsule Take 300 mg by mouth 2 (two) times daily.     HYDROcodone  bit-homatropine (HYDROMET) 5-1.5 MG/5ML syrup Take 5 mLs by mouth every 6 (six) hours as needed for cough.     Iron, Ferrous Sulfate, 325 (65 Fe) MG TABS Take 65 mg by mouth daily with breakfast.     levothyroxine  (SYNTHROID ) 100 MCG tablet Take 1 tablet (100 mcg total) by mouth daily at 6 (six) AM. 30 tablet 2   ondansetron  (ZOFRAN ) 8 MG tablet Take 1 tablet (8 mg total) by mouth every 8 (eight) hours as needed for nausea or vomiting. Start after day 3 of chemotherapy if needed 30 tablet 2   oxyCODONE -acetaminophen  (PERCOCET) 5-325 MG tablet Take 1 tablet by mouth every 4 (four) hours as needed. 40 tablet 0   No current facility-administered medications for  this visit.    VITAL SIGNS: There were no vitals taken for this visit. There were no vitals filed for this visit.  Estimated body mass index is 20.76 kg/m as calculated from the following:   Height as of 07/06/23: 5' 3 (1.6 m).   Weight as of 07/06/23: 117 lb 3.2 oz (53.2 kg).  LABS: CBC:    Component Value Date/Time   WBC 1.0 (L) 07/06/2023 0921   WBC 6.2 06/16/2023 1535   HGB 8.4 (L) 07/06/2023 0921   HCT 25.9 (L) 07/06/2023 0921   PLT 242 07/06/2023 0921   MCV 91.5 07/06/2023 0921   NEUTROABS 0.3 (LL) 07/06/2023 0921   LYMPHSABS 0.2 (L) 07/06/2023 0921   MONOABS 0.5 07/06/2023 0921   EOSABS 0.0 07/06/2023 0921   BASOSABS 0.0 07/06/2023 0921   Comprehensive Metabolic Panel:    Component Value Date/Time   NA 139 07/06/2023 0921   NA 141 03/07/2015 0000   K 4.1 07/06/2023 0921   CL 106 07/06/2023 0921   CO2 26 07/06/2023 0921   BUN 27  (H) 07/06/2023 0921   CREATININE 1.25 (H) 07/06/2023 0921   GLUCOSE 108 (H) 07/06/2023 0921   CALCIUM 9.3 07/06/2023 0921   AST 16 07/06/2023 0921   ALT 16 07/06/2023 0921   ALKPHOS 62 07/06/2023 0921   BILITOT 0.3 07/06/2023 0921   PROT 6.9 07/06/2023 0921   ALBUMIN 3.9 07/06/2023 0921    RADIOGRAPHIC STUDIES: NM PET Image Restage (PS) Skull Base to Thigh (F-18 FDG) Result Date: 06/19/2023 CLINICAL DATA:  Subsequent treatment strategy for non-small cell lung cancer. Additional history of high-grade urothelial carcinoma. EXAM: NUCLEAR MEDICINE PET SKULL BASE TO THIGH TECHNIQUE: 5.7 mCi F-18 FDG was injected intravenously. Full-ring PET imaging was performed from the skull base to thigh after the radiotracer. CT data was obtained and used for attenuation correction and anatomic localization. Fasting blood glucose: 95 mg/dl COMPARISON:  FDG PET scan 08/13/2022 CT chest 05/25/2023, CT abdomen pelvis 06/16/2023. FINDINGS: NECK: No hypermetabolic lymph nodes in the neck. Incidental CT findings: Bilateral hypermetabolic pulmonary nodules. For example superior segment LEFT lower lobe nodule measuring 14 mm (image 59) with SUV max equal 8.1. Hypermetabolic LEFT lower lobe pulmonary nodule imbedded within segmental atelectasis with SUV max equal 6.9 on image 76. Hypermetabolic RIGHT lower lobe nodule measures 10 mm with SUV max equal 7.2 on image 76. No hypermetabolic mediastinal lymph nodes. CHEST: Dense atelectasis/consolidation in the LEFT lower lobe. Incidental CT findings: None. ABDOMEN/PELVIS: large intensely hypermetabolic retroperitoneal lymph node in the upper abdomen at the level of the renal veins measuring 2.9 cm (image 17) with SUV max equal 14.0. Adjacent paraspinal node beneath the upper insertion of the RIGHT psoas with SUV max equal 13.3 (image 117). hypermetabolic aortocaval retroperitoneal node on image 114. Mesenteric nodule in the LEFT upper quadrant measuring 16 mm on image 132 with SUV  max 8.3. Incidental CT findings: Solitary LEFT kidney. Post RIGHT nephrectomy. Bladder normal. SKELETON: RIGHT subtle focus of metabolic activity LEFT iliac wing with SUV max equal 4.8 on image 150. No CT correlation. Larger/intense radiotracer activity in the spinous process of the T12 vertebral body with SUV max equal 13.7 on image 101. There is subtle bone destruction at this level. Incidental CT findings: None. IMPRESSION: 1. Hypermetabolic bilateral pulmonary nodules consistent with pulmonary metastasis. 2. Hypermetabolic retroperitoneal lymph nodes in the upper abdomen consistent with metastatic adenopathy. 3. Hypermetabolic mesenteric nodule in the LEFT upper quadrant consistent with metastatic adenopathy. 4. Hypermetabolic osseous metastasis in  the spinous process of the T12 vertebral body and LEFT sacral ala. 5. Solitary LEFT kidney post RIGHT nephrectomy Electronically Signed   By: Jackquline Boxer M.D.   On: 06/19/2023 14:25   MR Brain W Wo Contrast Result Date: 06/17/2023 CLINICAL DATA:  Metastatic disease evaluation Newly diagnosed stage IV either lung or urothelial cancer restaging EXAM: MRI HEAD WITHOUT AND WITH CONTRAST TECHNIQUE: Multiplanar, multiecho pulse sequences of the brain and surrounding structures were obtained without and with intravenous contrast. CONTRAST:  6mL GADAVIST  GADOBUTROL  1 MMOL/ML IV SOLN COMPARISON:  MRI head August 18, 2022. FINDINGS: Brain: No evidence of acute infarct, acute hemorrhage, midline shift or hydrocephalus. Unchanged 1.3 cm enhancing extra-axial dural-based lesion along the right frontal convexity, compatible with a meningioma. No pathologic enhancement. Vascular: Major arterial flow voids are maintained. Skull and upper cervical spine: Normal marrow signal. Sinuses/Orbits: Clear sinuses.  No acute orbital findings. Other: Trace left mastoid effusion. IMPRESSION: 1. No evidence of acute intracranial abnormality or metastatic disease. 2. Unchanged putative 1.3  cm meningioma along the right frontal convexity without significant mass effect. Electronically Signed   By: Gilmore GORMAN Molt M.D.   On: 06/17/2023 08:15   CT ABDOMEN PELVIS W CONTRAST Result Date: 06/16/2023 CLINICAL DATA:  Right lower quadrant abdominal pain. History of non-small cell lung cancer with metastatic disease and urothelial cancer. EXAM: CT ABDOMEN AND PELVIS WITH CONTRAST TECHNIQUE: Multidetector CT imaging of the abdomen and pelvis was performed using the standard protocol following bolus administration of intravenous contrast. RADIATION DOSE REDUCTION: This exam was performed according to the departmental dose-optimization program which includes automated exposure control, adjustment of the mA and/or kV according to patient size and/or use of iterative reconstruction technique. CONTRAST:  OMNIPAQUE  IOHEXOL  300 MG/ML  SOLN COMPARISON:  CT of the chest 05/25/2023. CT abdomen and pelvis 05/09/2023. FINDINGS: Lower chest: Nodular densities in the lung bases measuring up to 5 mm appear unchanged. There is a calcified nodule in the right lung base, unchanged. Hepatobiliary: No focal liver abnormality is seen. No gallstones, gallbladder wall thickening, or biliary dilatation. Pancreas: Unremarkable. No pancreatic ductal dilatation or surrounding inflammatory changes. Spleen: Normal in size without focal abnormality. Adrenals/Urinary Tract: Right nephrectomy changes are present. Right adrenal gland not seen. The left adrenal gland appears within normal limits. There is vague focal area of hypodensity in the posterior left kidney measuring 2.1 by 2.8 by 1.7 cm. This appears heterogeneous with some mild vascularity. No perinephric fat stranding or hydronephrosis. Bladder is within normal limits. Stomach/Bowel: Stomach is within normal limits. Appendix app is not seen ears normal. No evidence of bowel wall thickening, distention, or inflammatory changes. Vascular/Lymphatic: Aorta is normal in size.  There are atherosclerotic calcifications of the aorta. There is a 3.5 x 3.5 by 4.1 cm heterogeneous hypoechoic area in the right retroperitoneum the level of the right kidney which appears similar to the prior study. This likely compresses or invades the IVC. This is slightly increased in size when compared to prior. There is a new rounded soft tissue mass with central hypodensity in the left mesentery measuring 2.1 x 1.9 cm worrisome for a necrotic lymph node. Reproductive: Uterus and bilateral adnexa are unremarkable. Other: There is a small amount of free fluid in the pelvis. There is no focal abdominal wall hernia. There is new intramuscular thickening and edema with central hypodensity in the lower left anterior abdominal wall. This is ill-defined area measuring proximally 1.6 x 2.4 cm image 2/40. Musculoskeletal: Degenerative changes affect the spine  and hips. Chronic fractures of the right transverse processes at L3 and L4 appear unchanged. IMPRESSION: 1. New 2.1 cm necrotic lymph node in the left mesentery worrisome for metastatic disease. 2. New intramuscular thickening and edema with central hypodensity in the lower left anterior abdominal wall worrisome for metastatic disease. 3. Slight increase in size of the heterogeneous hypoechoic area in the right retroperitoneum at the level of the right kidney. This compresses and possibly invades the IVC. Findings are worrisome for metastatic disease. 4. Vague focal area of hypodensity in the posterior left kidney measuring 2.8 cm worrisome for metastatic disease. Infection not excluded. 5. Small amount of free fluid in the pelvis. 6. Stable nodular densities in the lung bases measuring up to 5 mm. 7. Aortic atherosclerosis. Aortic Atherosclerosis (ICD10-I70.0). Electronically Signed   By: Greig Pique M.D.   On: 06/16/2023 16:35    PERFORMANCE STATUS (ECOG) : 1 - Symptomatic but completely ambulatory  Review of Systems  Constitutional:  Positive for  activity change, appetite change and fatigue.  Musculoskeletal:  Positive for arthralgias.  Unless otherwise noted, a complete review of systems is negative.  Physical Exam General: NAD Cardiovascular: regular rate and rhythm Pulmonary: clear ant fields Abdomen: soft, nontender, + bowel sounds Extremities: no edema, no joint deformities Skin: no rashes Neurological: Alert and oriented x3  IMPRESSION:  This is my initial visit with Mrs. AIDEE LATIMORE. No acute distress noted. Patient is ambulatory. Alert and able to engage in discussions appropriately. She is accompanied by her husband, Archie.   I introduced myself, [place] RN, and Palliative's role in collaboration with the oncology team. Concept of Palliative Care was introduced as specialized medical care for people and their families living with serious illness.  It focuses on providing relief from the symptoms and stress of a serious illness.  The goal is to improve quality of life for both the patient and the family. Values and goals of care important to patient and family were attempted to be elicited.   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 They will be celebrating an anniversary in March. She has three children from a previous relationship and four grandchildren.   She experiences significant fatigue, feeling very tired and 'washed out', with frequent rest periods that do not lead to sleep. This fatigue is a new development and is impacting her daily activities at times. Attributes symptoms to her current treatment.   She has a history of kidney surgery, which resulted in a psoas muscle injury, causing difficulty walking long distances without pain. She experiences achiness in her back but no significant pain otherwise. Pain management includes Tylenol  and occasional Percocet, which causes constipation managed with stool softeners. Pain, when present, reaches a level of 6 out  of 10 but is reduced to zero with medication. She does not require a cane for walking but avoids long distances due to discomfort.  We reviewed her current medications at length. The patient was previously prescribed gabapentin  and Flexeril  for her pain and discomfort however these have been discontinued as she felt they were not effective. Does not require Percocet daily only when experiencing severe pain. Given pain is controlled no adjustments to medications at this time. Understands we will continue to closely monitor and adjust regimen as needed.   Mrs. Czerwinski reports experiencing an episode of nausea and vomiting yesterday, which was a first since starting treatment. The symptoms resolved after one  episode of vomiting without the use of nausea medication. She also experienced dizziness and a temporary change in vision to gray prior to this episode. Endorses that she has antiemetics on hand in the home if needed.   Her appetite is decreased, and she notes a metallic taste affecting her food intake. She consumes one to two Premier Protein supplements daily and is cautious about her protein intake due to her single kidney. Also discussed use of OTC iron. Education provided on risk of constipation. We discussed at length ways to increase nutrition including small frequent meals versus large meals.   She has difficulty sleeping, waking up after 2-3 hours despite taking Lunesta  2 mg. Previous trials of Ambien , Xanax , and Trazodone  were ineffective. We discussed increasing Lunesta  dose to 4mg  to improve sleep quality. Education provided with understanding to contact office if not effective or with residual drowsiness the following day.   Mrs. Pugmire would like to engage in more activity specifically walking around the home as the weather gets warmer. Advised to continue to listen to her body. Allow for further recovery and slowly increase activity as tolerated. Endorses fatigue on chemoradiation days. We  discuss this can be normal and ok for her body to not have as much stamina. She is aware on those days resting is appropriate allowing her body time to recover and adjust. She and husband verbalized understanding and appreciation.   All questions answered and support provided.  Goals of Care We discussed  current illness and what it means in the larger context of  on-going co-morbidities. Natural disease trajectory and expectations were discussed.  Mrs. Dancer and her husband are realistic in their understanding of patient's current illness and overall prognosis. She is clear in expressing wishes to continue to treat the treatable allowing her every opportunity to thrive while understanding her quality of life is most important. We discussed focusing on her quality of life and making complex decisions centered around this. She wishes to remain as good as she can for as long as she can. Emotional support provided.   Patient has a documented advanced directive on file. Her healthcare agents are listed as Rosaline Cook and [employer]. She is open to organ donation. No desire for artifical tube feedings. Would not want her life prolonged. Her health care agents are to follow her living will as documented.   I discussed the importance of continued conversation with family and their medical providers regarding overall plan of care and treatment options, ensuring decisions are within the context of the patients values and GOCs.  PLAN: Established therapeutic relationship. Education provided on palliative's role in collaboration with their Oncology/Radiation team.  Cancer-related fatigue Reports of feeling very tired and needing to rest frequently. Attributes this to recent treatments (chemoradiation). -Continue current management and monitor for changes. -Allow for frequent rest periods.   Psoas muscle injury Difficulty walking long distances due to pain from previous injury to the psoas muscle  during kidney surgery. -Advise to rest and gradually increase activity as tolerated. -Percocet and Tylenol  as needed.   Nausea and vomiting Single episode of nausea and vomiting, no ongoing symptoms. -Advise to use anti-nausea medication if symptoms recur.  Poor appetite Reports poor appetite and changes in taste, possibly related to chemotherapy. -Advise to continue with protein supplements and consider small, frequent meals to improve nutritional intake.  Pain management Reports occasional pain managed with Tylenol  and Percocet as needed. -Continue current pain management strategy and monitor for changes. -Patient aware to  contact office with any changes in pain.   Insomnia Reports difficulty maintaining sleep, current medication (Lunesta  2mg ) not effective. -Increase Lunesta  to 4mg  at bedtime and reassess in one week.  Goals of Care Realistic in her understanding. She is clear in expressing wishes to continue to treat the treatable allowing her every opportunity to thrive while understanding her quality of life is most important. -Patient has a documented advanced directive on file.  -Her healthcare agents are listed as Rosaline Cook and Tinnie Hurst.  -She is open to organ donation.  -No desire for artifical tube feedings.  -Would not want her life prolonged.  -Her health care agents are to follow her living will as documented.   Follow-up plans -Check in during next infusion on 07/20/2023. -Consider revisiting gabapentin  for pain management if pain worsens. -Consider changes to insomnia management if increased Lunesta  dose is not effective. -Advise to contact provider with any significant changes or concerns.  Patient expressed understanding and was in agreement with this plan. She also understands that She can call the clinic at any time with any questions, concerns, or complaints.   Thank you for your referral and allowing Palliative to assist in Mrs. Esabella J Porter's  care.   Number and complexity of problems addressed: HIGH - 1 or more chronic illnesses with SEVERE exacerbation, progression, or side effects of treatment - advanced cancer, pain. Any controlled substances utilized were prescribed in the context of palliative care.  Visit consisted of counseling and education dealing with the complex and emotionally intense issues of symptom management and palliative care in the setting of serious and potentially life-threatening illness.  Signed by: Levon Borer, AGPCNP-BC Palliative Medicine Team/[facility]

## 2023-07-11 ENCOUNTER — Encounter: Payer: Self-pay | Admitting: Internal Medicine

## 2023-07-13 ENCOUNTER — Encounter: Payer: Self-pay | Admitting: Internal Medicine

## 2023-07-13 ENCOUNTER — Inpatient Hospital Stay: Payer: Medicare Other | Attending: Internal Medicine

## 2023-07-13 ENCOUNTER — Other Ambulatory Visit: Payer: Medicare Other

## 2023-07-13 ENCOUNTER — Ambulatory Visit
Admission: RE | Admit: 2023-07-13 | Discharge: 2023-07-13 | Disposition: A | Discharge status: Home / Self Care | Payer: Medicare Other | Source: Ambulatory Visit | Attending: Radiation Oncology

## 2023-07-13 ENCOUNTER — Ambulatory Visit: Payer: Medicare Other | Admitting: Internal Medicine

## 2023-07-13 ENCOUNTER — Encounter (HOSPITAL_COMMUNITY): Payer: Self-pay | Admitting: Internal Medicine

## 2023-07-13 ENCOUNTER — Inpatient Hospital Stay: Payer: Medicare Other | Attending: Internal Medicine | Admitting: Internal Medicine

## 2023-07-13 ENCOUNTER — Inpatient Hospital Stay: Payer: Medicare Other

## 2023-07-13 ENCOUNTER — Other Ambulatory Visit: Payer: Self-pay

## 2023-07-13 VITALS — BP 134/89 | HR 102 | Temp 98.3°F | Resp 16 | Ht 63.0 in | Wt 116.7 lb

## 2023-07-13 VITALS — BP 115/74 | HR 93 | Temp 98.4°F | Resp 16

## 2023-07-13 DIAGNOSIS — T451X5A Adverse effect of antineoplastic and immunosuppressive drugs, initial encounter: Secondary | ICD-10-CM | POA: Insufficient documentation

## 2023-07-13 DIAGNOSIS — Z5189 Encounter for other specified aftercare: Secondary | ICD-10-CM | POA: Diagnosis not present

## 2023-07-13 DIAGNOSIS — Z905 Acquired absence of kidney: Secondary | ICD-10-CM | POA: Insufficient documentation

## 2023-07-13 DIAGNOSIS — C679 Malignant neoplasm of bladder, unspecified: Secondary | ICD-10-CM

## 2023-07-13 DIAGNOSIS — D709 Neutropenia, unspecified: Secondary | ICD-10-CM | POA: Insufficient documentation

## 2023-07-13 DIAGNOSIS — D6481 Anemia due to antineoplastic chemotherapy: Secondary | ICD-10-CM | POA: Diagnosis not present

## 2023-07-13 DIAGNOSIS — C7951 Secondary malignant neoplasm of bone: Secondary | ICD-10-CM | POA: Insufficient documentation

## 2023-07-13 DIAGNOSIS — Z87891 Personal history of nicotine dependence: Secondary | ICD-10-CM | POA: Diagnosis not present

## 2023-07-13 DIAGNOSIS — D649 Anemia, unspecified: Secondary | ICD-10-CM | POA: Diagnosis not present

## 2023-07-13 DIAGNOSIS — Z5111 Encounter for antineoplastic chemotherapy: Secondary | ICD-10-CM | POA: Insufficient documentation

## 2023-07-13 DIAGNOSIS — Z95828 Presence of other vascular implants and grafts: Secondary | ICD-10-CM

## 2023-07-13 DIAGNOSIS — C7800 Secondary malignant neoplasm of unspecified lung: Secondary | ICD-10-CM

## 2023-07-13 DIAGNOSIS — C3412 Malignant neoplasm of upper lobe, left bronchus or lung: Secondary | ICD-10-CM | POA: Insufficient documentation

## 2023-07-13 DIAGNOSIS — Z510 Encounter for antineoplastic radiation therapy: Secondary | ICD-10-CM | POA: Insufficient documentation

## 2023-07-13 LAB — RAD ONC ARIA SESSION SUMMARY

## 2023-07-13 LAB — CMP (CANCER CENTER ONLY)
ALT: 15 U/L (ref 0–44)
AST: 14 U/L — ABNORMAL LOW (ref 15–41)
Albumin: 3.8 g/dL (ref 3.5–5.0)
Alkaline Phosphatase: 72 U/L (ref 38–126)
Anion gap: 8 (ref 5–15)
BUN: 29 mg/dL — ABNORMAL HIGH (ref 8–23)
CO2: 25 mmol/L (ref 22–32)
Calcium: 9 mg/dL (ref 8.9–10.3)
Chloride: 103 mmol/L (ref 98–111)
Creatinine: 1.37 mg/dL — ABNORMAL HIGH (ref 0.44–1.00)
GFR, Estimated: 41 mL/min — ABNORMAL LOW (ref >60)
Glucose, Bld: 110 mg/dL — ABNORMAL HIGH (ref 70–99)
Potassium: 4.1 mmol/L (ref 3.5–5.1)
Sodium: 136 mmol/L (ref 135–145)
Total Bilirubin: 0.3 mg/dL (ref 0.0–1.2)
Total Protein: 6.8 g/dL (ref 6.5–8.1)

## 2023-07-13 LAB — CBC WITH DIFFERENTIAL (CANCER CENTER ONLY)
Abs Immature Granulocytes: 0.3 10*3/uL — ABNORMAL HIGH (ref 0.00–0.07)
Basophils Absolute: 0 10*3/uL (ref 0.0–0.1)
Basophils Relative: 1 %
Eosinophils Absolute: 0.1 10*3/uL (ref 0.0–0.5)
Eosinophils Relative: 1 %
HCT: 27.5 % — ABNORMAL LOW (ref 36.0–46.0)
Hemoglobin: 9 g/dL — ABNORMAL LOW (ref 12.0–15.0)
Immature Granulocytes: 5 %
Lymphocytes Relative: 3 %
Lymphs Abs: 0.2 10*3/uL — ABNORMAL LOW (ref 0.7–4.0)
MCH: 30 pg (ref 26.0–34.0)
MCHC: 32.7 g/dL (ref 30.0–36.0)
MCV: 91.7 fL (ref 80.0–100.0)
Monocytes Absolute: 1.4 10*3/uL — ABNORMAL HIGH (ref 0.1–1.0)
Monocytes Relative: 23 %
Neutro Abs: 4 10*3/uL (ref 1.7–7.7)
Neutrophils Relative %: 67 %
Platelet Count: 233 10*3/uL (ref 150–400)
RBC: 3 MIL/uL — ABNORMAL LOW (ref 3.87–5.11)
RDW: 16.5 % — ABNORMAL HIGH (ref 11.5–15.5)
WBC Count: 6 10*3/uL (ref 4.0–10.5)
nRBC: 0 % (ref 0.0–0.2)

## 2023-07-13 MED ORDER — SODIUM CHLORIDE 0.9 % IV SOLN
277.5000 mg | Freq: Once | INTRAVENOUS | Status: AC
  Administered 2023-07-13: 280 mg via INTRAVENOUS
  Filled 2023-07-13: qty 28

## 2023-07-13 MED ORDER — HEPARIN SOD (PORK) LOCK FLUSH 100 UNIT/ML IV SOLN
500.0000 [IU] | Freq: Once | INTRAVENOUS | Status: AC | PRN
  Administered 2023-07-13: 500 [IU]

## 2023-07-13 MED ORDER — SODIUM CHLORIDE 0.9 % IV SOLN
INTRAVENOUS | Status: DC
Start: 2023-07-13 — End: 2023-07-13

## 2023-07-13 MED ORDER — SODIUM CHLORIDE 0.9% FLUSH
10.0000 mL | Freq: Once | INTRAVENOUS | Status: AC
  Administered 2023-07-13: 10 mL

## 2023-07-13 MED ORDER — DEXAMETHASONE SODIUM PHOSPHATE 10 MG/ML IJ SOLN
10.0000 mg | Freq: Once | INTRAMUSCULAR | Status: AC
  Administered 2023-07-13: 10 mg via INTRAVENOUS
  Filled 2023-07-13: qty 1

## 2023-07-13 MED ORDER — PALONOSETRON HCL INJECTION 0.25 MG/5ML
0.2500 mg | Freq: Once | INTRAVENOUS | Status: AC
  Administered 2023-07-13: 0.25 mg via INTRAVENOUS
  Filled 2023-07-13: qty 5

## 2023-07-13 MED ORDER — SODIUM CHLORIDE 0.9% FLUSH
10.0000 mL | INTRAVENOUS | Status: DC | PRN
  Administered 2023-07-13: 10 mL

## 2023-07-13 MED ORDER — SODIUM CHLORIDE 0.9 % IV SOLN
1000.0000 mg/m2 | Freq: Once | INTRAVENOUS | Status: AC
  Administered 2023-07-13: 1521 mg via INTRAVENOUS
  Filled 2023-07-13: qty 40.01

## 2023-07-13 NOTE — Progress Notes (Signed)
[facility] [phone]) [phone]   [phone]) [phone]  OFFICE PROGRESS NOTE  [name], MD [address] [place] [address]  DIAGNOSIS:  1) Stage IIIB (T3, N2, M0) non-small cell lung cancer, squamous cell carcinoma presented with obstructive left lingular mass with consolidation and collapse of the lingula as well as part of the left upper lobe and likely invasion of the mediastinum diagnosed in February 2024.  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  Biomarker Findings Microsatellite status - MS-Stable Tumor Mutational Burden - 5 Muts/Mb Genomic Findings For a complete list of the genes assayed, please refer to the Appendix. CCND1 amplification FGFR1 amplification MDM2 amplification FGF19 amplification FGF3 amplification FGF4 amplification TP53 Q136* 8 Disease relevant genes with no reportable alterations: ALK, BRAF, EGFR, ERBB2, KRAS, MET, RET, ROS1  PDL1: 5%   PRIOR THERAPY:  1) Concurrent chemoradiation with weekly carboplatin for AUC of 2 and paclitaxel 45 Mg/M2. Status post 7 weeks of treatment.  Last dose was given October 06, 2022 with partial response. 2) Consolidation treatment with immunotherapy with Imfinzi 1500 Mg IV every 4 weeks.  First dose on November 13, 2022.  Status post 2 cycles.  This was discontinued secondary to toxicity and suspicious immunotherapy mediated pneumonitis. 3) Right Kidney and Ureter Nephrectomy under the care of Dr. Sande Brothers. Tumor extensively involves renal pelvis, extends through the kidney into the perinephric fat with lymphovascular invasion and positive margin at the peri soft tissue of the renal vein   CURRENT THERAPY: Carboplatin for AUC of 5 on day 1 and gemcitabine 1000 Mg/M2  on days 1 and 8 IV every 3 weeks. First dose on 06/15/2023.  Status post 1 cycle.  INTERVAL HISTORY: Evelyn Jordan 73 y.o. female returns to the clinic today for  follow-up visit accompanied by her daughter.Discussed the use of AI scribe software for clinical note transcription with the patient, who gave verbal consent to proceed.  History of Present Illness   The patient, with metastatic urothelial carcinoma, presents for chemotherapy treatment.  She was initially diagnosed with metastatic urothelial carcinoma in December 2024 and underwent nephrectomy with removal of the kidney and ureter.  She completed one cycle of chemotherapy, but the second cycle was delayed last week due to significant anemia, necessitating a transfusion. Her current symptoms include fatigue, which she attributes to anemia and chemotherapy. She also has a runny nose. No cough, shortness of breath, hemoptysis, epistaxis, gum bleeding, nausea, vomiting, or diarrhea.  Recent laboratory results show a total white blood count of 6,000, which is favorable for treatment. Her hemoglobin is low at 9, but it has improved from the previous week. Her platelet count is within normal limits.       MEDICAL HISTORY: Past Medical History:  Diagnosis Date   Depression    Fatty liver    Insomnia    Lung cancer (HCC) 07/25/2022   Osteoporosis    Pericardial effusion 03/05/2023   small noted on ECHO   Pulmonary nodule    Thyroid nodule     ALLERGIES:  is allergic to emend [fosaprepitant dimeglumine] and compazine [prochlorperazine edisylate].  MEDICATIONS:  Current Outpatient Medications  Medication Sig Dispense Refill   acetaminophen (TYLENOL) 325 MG tablet Take 2 tablets (650 mg total) by mouth every 6 (six) hours as needed for mild pain (pain score 1-3) (or Fever >/= 101).     albuterol (VENTOLIN HFA) 108 (90 Base) MCG/ACT inhaler  Inhale 2 puffs into the lungs every 6 (six) hours as needed for wheezing or shortness of breath. (Patient not taking: Reported on 06/16/2023) 8 g 6   ALPRAZolam (XANAX) 0.5 MG tablet Take 0.5 mg by mouth daily as needed for anxiety or sleep.     bisacodyl  (DULCOLAX) 5 MG EC tablet Take 10-15 mg by mouth daily as needed for moderate constipation.     ciprofloxacin (CIPRO) 500 MG tablet Take 1 tablet (500 mg total) by mouth 2 (two) times daily. 10 tablet 0   cyclobenzaprine (FLEXERIL) 5 MG tablet Take 1 tablet (5 mg total) by mouth 3 (three) times daily as needed for muscle spasms. 30 tablet 0   eszopiclone (LUNESTA) 2 MG TABS tablet Take 2 mg by mouth at bedtime as needed for sleep. Take immediately before bedtime     gabapentin (NEURONTIN) 300 MG capsule Take 300 mg by mouth 2 (two) times daily.     HYDROcodone bit-homatropine (HYDROMET) 5-1.5 MG/5ML syrup Take 5 mLs by mouth every 6 (six) hours as needed for cough.     Iron, Ferrous Sulfate, 325 (65 Fe) MG TABS Take 65 mg by mouth daily with breakfast.     levothyroxine (SYNTHROID) 100 MCG tablet Take 1 tablet (100 mcg total) by mouth daily at 6 (six) AM. 30 tablet 2   ondansetron (ZOFRAN) 8 MG tablet Take 1 tablet (8 mg total) by mouth every 8 (eight) hours as needed for nausea or vomiting. Start after day 3 of chemotherapy if needed 30 tablet 2   oxyCODONE-acetaminophen (PERCOCET) 5-325 MG tablet Take 1 tablet by mouth every 4 (four) hours as needed. 40 tablet 0   No current facility-administered medications for this visit.    SURGICAL HISTORY:  Past Surgical History:  Procedure Laterality Date   ANKLE SURGERY Right    BREAST BIOPSY Right    benign 2013   BRONCHIAL BIOPSY  08/04/2022   Procedure: BRONCHIAL BIOPSIES;  Surgeon: Leslye Peer, MD;  Location: [facility] ENDOSCOPY;  Service: Cardiopulmonary;;   BRONCHIAL BIOPSY  06/22/2023   Procedure: BRONCHIAL BIOPSIES;  Surgeon: Leslye Peer, MD;  Location: [facility] ENDOSCOPY;  Service: Pulmonary;;   BRONCHIAL BRUSHINGS  08/04/2022   Procedure: BRONCHIAL BRUSHINGS;  Surgeon: Leslye Peer, MD;  Location: [facility] ENDOSCOPY;  Service: Cardiopulmonary;;   BRONCHIAL BRUSHINGS  06/22/2023   Procedure: BRONCHIAL BRUSHINGS;  Surgeon: Leslye Peer, MD;   Location: [facility] ENDOSCOPY;  Service: Pulmonary;;   BRONCHIAL NEEDLE ASPIRATION BIOPSY  06/22/2023   Procedure: BRONCHIAL NEEDLE ASPIRATION BIOPSIES;  Surgeon: Leslye Peer, MD;  Location: [facility] ENDOSCOPY;  Service: Pulmonary;;   BRONCHIAL WASHINGS  06/22/2023   Procedure: BRONCHIAL WASHINGS;  Surgeon: Leslye Peer, MD;  Location: [facility] ENDOSCOPY;  Service: Pulmonary;;   COLONOSCOPY     CYSTOSCOPY WITH BIOPSY N/A 05/15/2023   Procedure: CYSTOSCOPY WITH RIGHT RENAL PELVIS BIOPSY;  Surgeon: Rene Paci, MD;  Location: [facility] ORS;  Service: Urology;  Laterality: N/A;   CYSTOSCOPY WITH STENT PLACEMENT Right 05/26/2023   Procedure: CYSTOSCOPY WITH RIGHT STENT PLACEMENT; TUR OF RIGHT URETERAL ORISICE;  Surgeon: Rene Paci, MD;  Location: [facility] ORS;  Service: Urology;  Laterality: Right;   CYSTOSCOPY/RETROGRADE/URETEROSCOPY N/A 05/15/2023   Procedure: CYSTOSCOPY, RIGHT RETROGRADE, RIGHT URETEROSCOPY, RIGHT STENT PLACEMENT;  Surgeon: Rene Paci, MD;  Location: [facility] ORS;  Service: Urology;  Laterality: N/A;  30 MINUTES   HEMOSTASIS CONTROL  08/04/2022   Procedure: HEMOSTASIS CONTROL;  Surgeon: Leslye Peer, MD;  Location:  [facility] ENDOSCOPY;  Service: Cardiopulmonary;;   IR IMAGING GUIDED PORT INSERTION  09/04/2022   repair of a torn meniscus Left 05/2018   ROBOT ASSITED LAPAROSCOPIC NEPHROURETERECTOMY Right 05/26/2023   Procedure: XI ROBOT ASSITED LAPAROSCOPIC NEPHROURETERECTOMY;  Surgeon: Rene Paci, MD;  Location: [facility] ORS;  Service: Urology;  Laterality: Right;  210 MINS FOR CASE   VIDEO BRONCHOSCOPY Left 08/04/2022   Procedure: VIDEO BRONCHOSCOPY WITH FLUORO;  Surgeon: Leslye Peer, MD;  Location: [facility] ENDOSCOPY;  Service: Cardiopulmonary;  Laterality: Left;    REVIEW OF SYSTEMS:  Constitutional: positive for fatigue Eyes: negative Ears, nose, mouth, throat, and face: negative Respiratory: negative Cardiovascular: negative Gastrointestinal:  negative Genitourinary:negative Integument/breast: negative Hematologic/lymphatic: negative Musculoskeletal:negative Neurological: negative Behavioral/Psych: negative Endocrine: negative Allergic/Immunologic: negative   PHYSICAL EXAMINATION: General appearance: alert, cooperative, fatigued, and no distress Head: Normocephalic, without obvious abnormality, atraumatic Neck: no adenopathy, no JVD, supple, symmetrical, trachea midline, and thyroid not enlarged, symmetric, no tenderness/mass/nodules Lymph nodes: Cervical, supraclavicular, and axillary nodes normal. Resp: clear to auscultation bilaterally Back: symmetric, no curvature. ROM normal. No CVA tenderness. Cardio: regular rate and rhythm, S1, S2 normal, no murmur, click, rub or gallop GI: soft, non-tender; bowel sounds normal; no masses,  no organomegaly Extremities: extremities normal, atraumatic, no cyanosis or edema Neurologic: Alert and oriented X 3, normal strength and tone. Normal symmetric reflexes. Normal coordination and gait  ECOG PERFORMANCE STATUS: 1 - Symptomatic but completely ambulatory  Blood pressure 134/89, pulse (!) 102, temperature 98.3 F (36.8 C), temperature source Temporal, resp. rate 16, height 5\' 3"  (1.6 m), weight 116 lb 11.2 oz (52.9 kg), SpO2 100%.  LABORATORY DATA: Lab Results  Component Value Date   WBC 6.0 07/13/2023   HGB 9.0 (L) 07/13/2023   HCT 27.5 (L) 07/13/2023   MCV 91.7 07/13/2023   PLT 233 07/13/2023      Chemistry      Component Value Date/Time   NA 136 07/13/2023 0734   NA 141 03/07/2015 0000   K 4.1 07/13/2023 0734   CL 103 07/13/2023 0734   CO2 25 07/13/2023 0734   BUN 29 (H) 07/13/2023 0734   CREATININE 1.37 (H) 07/13/2023 0734   GLU 103 03/07/2015 0000      Component Value Date/Time   CALCIUM 9.0 07/13/2023 0734   ALKPHOS 72 07/13/2023 0734   AST 14 (L) 07/13/2023 0734   ALT 15 07/13/2023 0734   BILITOT 0.3 07/13/2023 0734       RADIOGRAPHIC STUDIES: DG  C-ARM BRONCHOSCOPY Result Date: 06/22/2023 C-ARM BRONCHOSCOPY: Fluoroscopy was utilized by the requesting physician.  No radiographic interpretation.   DG C-Arm 1-60 Min-No Report Result Date: 06/22/2023 Fluoroscopy was utilized by the requesting physician.  No radiographic interpretation.   DG Chest Port 1 View Result Date: 06/22/2023 CLINICAL DATA:  Status post bronchoscopy EXAM: PORTABLE CHEST 1 VIEW COMPARISON:  X-ray 12/17/2022.  PET-CT 06/15/2023. FINDINGS: Right IJ chest port in place with tip along the SVC right atrial junction region. There is a left perihilar confluence opacity with some volume loss along left hemithorax, left lower lobe opacity. Areas of nodularity in the left midlung also seen. There is a right lower lobe lateral lung nodule as well. No right-sided pneumothorax or effusion. No right-sided consolidation. Normal cardiopericardial silhouette. Overlapping cardiac leads. Please correlate with the prior PET-CT. IMPRESSION: No pneumothorax post bronchoscopy. Electronically Signed   By: Karen Kays M.D.   On: 06/22/2023 11:49   NM PET Image Restage (PS) Skull Base to Thigh (F-18 FDG)  Result Date: 06/19/2023 CLINICAL DATA:  Subsequent treatment strategy for non-small cell lung cancer. Additional history of high-grade urothelial carcinoma. EXAM: NUCLEAR MEDICINE PET SKULL BASE TO THIGH TECHNIQUE: 5.7 mCi F-18 FDG was injected intravenously. Full-ring PET imaging was performed from the skull base to thigh after the radiotracer. CT data was obtained and used for attenuation correction and anatomic localization. Fasting blood glucose: 95 mg/dl COMPARISON:  FDG PET scan 08/13/2022 CT chest 05/25/2023, CT abdomen pelvis 06/16/2023. FINDINGS: NECK: No hypermetabolic lymph nodes in the neck. Incidental CT findings: Bilateral hypermetabolic pulmonary nodules. For example superior segment LEFT lower lobe nodule measuring 14 mm (image 59) with SUV max equal 8.1. Hypermetabolic LEFT lower lobe  pulmonary nodule imbedded within segmental atelectasis with SUV max equal 6.9 on image 76. Hypermetabolic RIGHT lower lobe nodule measures 10 mm with SUV max equal 7.2 on image 76. No hypermetabolic mediastinal lymph nodes. CHEST: Dense atelectasis/consolidation in the LEFT lower lobe. Incidental CT findings: None. ABDOMEN/PELVIS: large intensely hypermetabolic retroperitoneal lymph node in the upper abdomen at the level of the renal veins measuring 2.9 cm (image 17) with SUV max equal 14.0. Adjacent paraspinal node beneath the upper insertion of the RIGHT psoas with SUV max equal 13.3 (image 117). hypermetabolic aortocaval retroperitoneal node on image 114. Mesenteric nodule in the LEFT upper quadrant measuring 16 mm on image 132 with SUV max 8.3. Incidental CT findings: Solitary LEFT kidney. Post RIGHT nephrectomy. Bladder normal. SKELETON: RIGHT subtle focus of metabolic activity LEFT iliac wing with SUV max equal 4.8 on image 150. No CT correlation. Larger/intense radiotracer activity in the spinous process of the T12 vertebral body with SUV max equal 13.7 on image 101. There is subtle bone destruction at this level. Incidental CT findings: None. IMPRESSION: 1. Hypermetabolic bilateral pulmonary nodules consistent with pulmonary metastasis. 2. Hypermetabolic retroperitoneal lymph nodes in the upper abdomen consistent with metastatic adenopathy. 3. Hypermetabolic mesenteric nodule in the LEFT upper quadrant consistent with metastatic adenopathy. 4. Hypermetabolic osseous metastasis in the spinous process of the T12 vertebral body and LEFT sacral ala. 5. Solitary LEFT kidney post RIGHT nephrectomy Electronically Signed   By: [name] Bi M.D.   On: 06/19/2023 14:25   MR Brain W Wo Contrast Result Date: 06/17/2023 CLINICAL DATA:  Metastatic disease evaluation Newly diagnosed stage IV either lung or urothelial cancer restaging EXAM: MRI HEAD WITHOUT AND WITH CONTRAST TECHNIQUE: Multiplanar, multiecho pulse  sequences of the brain and surrounding structures were obtained without and with intravenous contrast. CONTRAST:  6mL GADAVIST GADOBUTROL 1 MMOL/ML IV SOLN COMPARISON:  MRI head August 18, 2022. FINDINGS: Brain: No evidence of acute infarct, acute hemorrhage, midline shift or hydrocephalus. Unchanged 1.3 cm enhancing extra-axial dural-based lesion along the right frontal convexity, compatible with a meningioma. No pathologic enhancement. Vascular: Major arterial flow voids are maintained. Skull and upper cervical spine: Normal marrow signal. Sinuses/Orbits: Clear sinuses.  No acute orbital findings. Other: Trace left mastoid effusion. IMPRESSION: 1. No evidence of acute intracranial abnormality or metastatic disease. 2. Unchanged putative 1.3 cm meningioma along the right frontal convexity without significant mass effect. Electronically Signed   By: Feliberto Harts M.D.   On: 06/17/2023 08:15   CT ABDOMEN PELVIS W CONTRAST Result Date: 06/16/2023 CLINICAL DATA:  Right lower quadrant abdominal pain. History of non-small cell lung cancer with metastatic disease and urothelial cancer. EXAM: CT ABDOMEN AND PELVIS WITH CONTRAST TECHNIQUE: Multidetector CT imaging of the abdomen and pelvis was performed using the standard protocol following bolus administration of intravenous contrast.  RADIATION DOSE REDUCTION: This exam was performed according to the departmental dose-optimization program which includes automated exposure control, adjustment of the mA and/or kV according to patient size and/or use of iterative reconstruction technique. CONTRAST:  OMNIPAQUE IOHEXOL 300 MG/ML  SOLN COMPARISON:  CT of the chest 05/25/2023. CT abdomen and pelvis 05/09/2023. FINDINGS: Lower chest: Nodular densities in the lung bases measuring up to 5 mm appear unchanged. There is a calcified nodule in the right lung base, unchanged. Hepatobiliary: No focal liver abnormality is seen. No gallstones, gallbladder wall thickening, or  biliary dilatation. Pancreas: Unremarkable. No pancreatic ductal dilatation or surrounding inflammatory changes. Spleen: Normal in size without focal abnormality. Adrenals/Urinary Tract: Right nephrectomy changes are present. Right adrenal gland not seen. The left adrenal gland appears within normal limits. There is vague focal area of hypodensity in the posterior left kidney measuring 2.1 by 2.8 by 1.7 cm. This appears heterogeneous with some mild vascularity. No perinephric fat stranding or hydronephrosis. Bladder is within normal limits. Stomach/Bowel: Stomach is within normal limits. Appendix app is not seen ears normal. No evidence of bowel wall thickening, distention, or inflammatory changes. Vascular/Lymphatic: Aorta is normal in size. There are atherosclerotic calcifications of the aorta. There is a 3.5 x 3.5 by 4.1 cm heterogeneous hypoechoic area in the right retroperitoneum the level of the right kidney which appears similar to the prior study. This likely compresses or invades the IVC. This is slightly increased in size when compared to prior. There is a new rounded soft tissue mass with central hypodensity in the left mesentery measuring 2.1 x 1.9 cm worrisome for a necrotic lymph node. Reproductive: Uterus and bilateral adnexa are unremarkable. Other: There is a small amount of free fluid in the pelvis. There is no focal abdominal wall hernia. There is new intramuscular thickening and edema with central hypodensity in the lower left anterior abdominal wall. This is ill-defined area measuring proximally 1.6 x 2.4 cm image 2/40. Musculoskeletal: Degenerative changes affect the spine and hips. Chronic fractures of the right transverse processes at L3 and L4 appear unchanged. IMPRESSION: 1. New 2.1 cm necrotic lymph node in the left mesentery worrisome for metastatic disease. 2. New intramuscular thickening and edema with central hypodensity in the lower left anterior abdominal wall worrisome for  metastatic disease. 3. Slight increase in size of the heterogeneous hypoechoic area in the right retroperitoneum at the level of the right kidney. This compresses and possibly invades the IVC. Findings are worrisome for metastatic disease. 4. Vague focal area of hypodensity in the posterior left kidney measuring 2.8 cm worrisome for metastatic disease. Infection not excluded. 5. Small amount of free fluid in the pelvis. 6. Stable nodular densities in the lung bases measuring up to 5 mm. 7. Aortic atherosclerosis. Aortic Atherosclerosis (ICD10-I70.0). Electronically Signed   By: Darliss Cheney M.D.   On: 06/16/2023 16:35    ASSESSMENT AND PLAN: This is a very pleasant 73 years old white female with  1) Stage IIIB (T3, N2, M0) non-small cell lung cancer, squamous cell carcinoma presented with obstructive left lingular mass with consolidation and collapse of the lingula as well as part of the left upper lobe and likely invasion of the mediastinum diagnosed in February 2024.  Molecular studies by foundation 1 showed no actionable mutations PDL1: 5% The patient underwent a course of concurrent chemoradiation with weekly carboplatin for AUC of 2 and paclitaxel 45 Mg/M2 status post 7  cycles.  Last dose was given on October 06, 2022 with partial  response. The patient tolerated the previous course of her treatment fairly well with no concerning adverse effect except for the fatigue and the radiation-induced esophagitis. She had partial response to this treatment. She underwent consolidation treatment with immunotherapy with Imfinzi 1500 Mg IV every 4 weeks status post 2 cycle.  Her treatment was discontinued secondary to toxicity.  The patient has been on observation 2) metastatic urothelial carcinoma with squamous cell differentiation status post right ureteral nephrectomy in December 2024.  She is currently on systemic chemotherapy with carboplatin for AUC of 5 on day 1 and gemcitabine 1000 Mg/M2 on days 1 and 8  every 3 weeks.  She is status post 1 cycle.    Metastatic Urothelial Carcinoma Stage IV metastatic urothelial carcinoma diagnosed in December 2024. Underwent nephrectomy and retroperitoneal lymph node dissection. Currently on chemotherapy with carboplatin and gemcitabine. Completed one cycle; second cycle delayed due to significant anemia. Current labs: WBC 6000, hemoglobin 9 (improved), platelets within normal range. Reports fatigue, likely due to anemia and chemotherapy. No new complaints of cough, dyspnea, bleeding, nausea, vomiting, or diarrhea. Prognosis discussed: average survival of 15 months with treatment. Advised against vitamin C infusions due to lack of proven benefit. Next CT scan planned after three chemotherapy cycles to assess efficacy. - Administer second cycle of chemotherapy with carboplatin and gemcitabine - Schedule CT scan in 5-6 weeks post three chemotherapy cycles - Advise higher doses of oral vitamin C if desired - Recommend routine multivitamins and minerals with caution due to single kidney - Avoid excessive supplements to prevent kidney overload  Anemia Anemia secondary to chemotherapy. Hemoglobin currently at 9, improved from last week. Fatigue likely related to anemia and chemotherapy. - Monitor hemoglobin levels regularly - Continue supportive care for anemia  General Health Maintenance Inquired about colonoscopy. Advised not necessary at this time due to priority of managing current cancer condition. - No colonoscopy needed at this time  Follow-up - Schedule follow-up appointment in 5-6 weeks for CT scan - Continue regular monitoring and supportive care.   The patient was advised to call immediately if she has any other concerning symptoms in the interval. The patient voices understanding of current disease status and treatment options and is in agreement with the current care plan.  All questions were answered. The patient knows to call the clinic with any  problems, questions or concerns. We can certainly see the patient much sooner if necessary.  The total time spent in the appointment was 30 minutes.  Disclaimer: This note was dictated with voice recognition software. Similar sounding words can inadvertently be transcribed and may not be corrected upon review.

## 2023-07-13 NOTE — Patient Instructions (Signed)
 [facility] - A DEPT OF [facility]   Discharge Instructions: Thank you for choosing [facility] to provide your oncology and hematology care.   If you have a lab appointment with the Cancer Center, please go directly to the Cancer Center and check in at the registration area.   Wear comfortable clothing and clothing appropriate for easy access to any Portacath or PICC line.   We strive to give you quality time with your provider. You may need to reschedule your appointment if you arrive late (15 or more minutes).  Arriving late affects you and other patients whose appointments are after yours.  Also, if you miss three or more appointments without notifying the office, you may be dismissed from the clinic at the provider's discretion.      For prescription refill requests, have your pharmacy contact our office and allow 72 hours for refills to be completed.    Today you received the following chemotherapy and/or immunotherapy agents: Gemcitabine (Gemzar) and Carboplatin       To help prevent nausea and vomiting after your treatment, we encourage you to take your nausea medication as directed.  BELOW ARE SYMPTOMS THAT SHOULD BE REPORTED IMMEDIATELY: *FEVER GREATER THAN 100.4 F (38 C) OR HIGHER *CHILLS OR SWEATING *NAUSEA AND VOMITING THAT IS NOT CONTROLLED WITH YOUR NAUSEA MEDICATION *UNUSUAL SHORTNESS OF BREATH *UNUSUAL BRUISING OR BLEEDING *URINARY PROBLEMS (pain or burning when urinating, or frequent urination) *BOWEL PROBLEMS (unusual diarrhea, constipation, pain near the anus) TENDERNESS IN MOUTH AND THROAT WITH OR WITHOUT PRESENCE OF ULCERS (sore throat, sores in mouth, or a toothache) UNUSUAL RASH, SWELLING OR PAIN  UNUSUAL VAGINAL DISCHARGE OR ITCHING   Items with * indicate a potential emergency and should be followed up as soon as possible or go to the Emergency Department if any problems should occur.  Please show the  CHEMOTHERAPY ALERT CARD or IMMUNOTHERAPY ALERT CARD at check-in to the Emergency Department and triage nurse.  Should you have questions after your visit or need to cancel or reschedule your appointment, please contact [facility] - A DEPT [facility]  Dept: [phone]  and follow the prompts.  Office hours are 8:00 a.m. to 4:30 p.m. Monday - Friday. Please note that voicemails left after 4:00 p.m. may not be returned until the following business day.  We are closed weekends and major holidays. You have access to a nurse at all times for urgent questions. Please call the main number to the clinic Dept: [phone] and follow the prompts.   For any non-urgent questions, you may also contact your provider using MyChart. We now offer e-Visits for anyone 20 and older to request care online for non-urgent symptoms. For details visit [email or web address].   Also download the MyChart app! Go to the app store, search "MyChart", open the app, select [place], and log in with your MyChart username and password.

## 2023-07-14 ENCOUNTER — Other Ambulatory Visit: Payer: Self-pay

## 2023-07-14 ENCOUNTER — Ambulatory Visit
Admission: RE | Admit: 2023-07-14 | Discharge: 2023-07-14 | Disposition: A | Discharge status: Home / Self Care | Payer: Medicare Other | Source: Ambulatory Visit | Attending: Radiation Oncology | Admitting: Radiation Oncology

## 2023-07-14 DIAGNOSIS — Z5111 Encounter for antineoplastic chemotherapy: Secondary | ICD-10-CM | POA: Diagnosis not present

## 2023-07-14 DIAGNOSIS — Z87891 Personal history of nicotine dependence: Secondary | ICD-10-CM | POA: Diagnosis not present

## 2023-07-14 DIAGNOSIS — C7951 Secondary malignant neoplasm of bone: Secondary | ICD-10-CM | POA: Diagnosis not present

## 2023-07-14 DIAGNOSIS — C679 Malignant neoplasm of bladder, unspecified: Secondary | ICD-10-CM | POA: Diagnosis not present

## 2023-07-14 DIAGNOSIS — Z510 Encounter for antineoplastic radiation therapy: Secondary | ICD-10-CM | POA: Diagnosis not present

## 2023-07-14 DIAGNOSIS — C3412 Malignant neoplasm of upper lobe, left bronchus or lung: Secondary | ICD-10-CM | POA: Diagnosis not present

## 2023-07-14 DIAGNOSIS — D6481 Anemia due to antineoplastic chemotherapy: Secondary | ICD-10-CM | POA: Diagnosis not present

## 2023-07-14 LAB — RAD ONC ARIA SESSION SUMMARY

## 2023-07-15 ENCOUNTER — Inpatient Hospital Stay (HOSPITAL_BASED_OUTPATIENT_CLINIC_OR_DEPARTMENT_OTHER): Payer: Medicare Other | Admitting: Nurse Practitioner

## 2023-07-15 ENCOUNTER — Ambulatory Visit
Admission: RE | Admit: 2023-07-15 | Discharge: 2023-07-15 | Disposition: A | Discharge status: Home / Self Care | Payer: Medicare Other | Source: Ambulatory Visit | Attending: Radiation Oncology

## 2023-07-15 ENCOUNTER — Encounter: Payer: Self-pay | Admitting: Nurse Practitioner

## 2023-07-15 ENCOUNTER — Other Ambulatory Visit: Payer: Self-pay

## 2023-07-15 ENCOUNTER — Ambulatory Visit: Payer: Medicare Other | Admitting: Cardiology

## 2023-07-15 VITALS — BP 109/70 | HR 100 | Temp 97.8°F | Resp 18 | Wt 115.6 lb

## 2023-07-15 DIAGNOSIS — Z5111 Encounter for antineoplastic chemotherapy: Secondary | ICD-10-CM | POA: Diagnosis not present

## 2023-07-15 DIAGNOSIS — R530 Neoplastic (malignant) related fatigue: Secondary | ICD-10-CM | POA: Diagnosis not present

## 2023-07-15 DIAGNOSIS — C679 Malignant neoplasm of bladder, unspecified: Secondary | ICD-10-CM | POA: Diagnosis not present

## 2023-07-15 DIAGNOSIS — Z510 Encounter for antineoplastic radiation therapy: Secondary | ICD-10-CM | POA: Diagnosis not present

## 2023-07-15 DIAGNOSIS — Z87891 Personal history of nicotine dependence: Secondary | ICD-10-CM | POA: Diagnosis not present

## 2023-07-15 DIAGNOSIS — R630 Anorexia: Secondary | ICD-10-CM

## 2023-07-15 DIAGNOSIS — G893 Neoplasm related pain (acute) (chronic): Secondary | ICD-10-CM

## 2023-07-15 DIAGNOSIS — C7800 Secondary malignant neoplasm of unspecified lung: Secondary | ICD-10-CM | POA: Diagnosis not present

## 2023-07-15 DIAGNOSIS — Z7189 Other specified counseling: Secondary | ICD-10-CM | POA: Diagnosis not present

## 2023-07-15 DIAGNOSIS — C7951 Secondary malignant neoplasm of bone: Secondary | ICD-10-CM

## 2023-07-15 DIAGNOSIS — Z515 Encounter for palliative care: Secondary | ICD-10-CM

## 2023-07-15 DIAGNOSIS — D6481 Anemia due to antineoplastic chemotherapy: Secondary | ICD-10-CM | POA: Diagnosis not present

## 2023-07-15 DIAGNOSIS — G4700 Insomnia, unspecified: Secondary | ICD-10-CM

## 2023-07-15 DIAGNOSIS — C3412 Malignant neoplasm of upper lobe, left bronchus or lung: Secondary | ICD-10-CM | POA: Diagnosis not present

## 2023-07-15 LAB — RAD ONC ARIA SESSION SUMMARY
Course Elapsed Days: 9
Plan Fractions Treated to Date: 8
Plan Fractions Treated to Date: 8
Plan Prescribed Dose Per Fraction: 3 Gy
Plan Prescribed Dose Per Fraction: 3 Gy
Plan Total Fractions Prescribed: 10
Plan Total Fractions Prescribed: 10
Plan Total Prescribed Dose: 30 Gy
Plan Total Prescribed Dose: 30 Gy
Reference Point Dosage Given to Date: 24 Gy
Reference Point Dosage Given to Date: 24 Gy
Reference Point Session Dosage Given: 3 Gy
Reference Point Session Dosage Given: 3 Gy
Session Number: 8

## 2023-07-16 ENCOUNTER — Other Ambulatory Visit: Payer: Self-pay

## 2023-07-16 ENCOUNTER — Telehealth: Payer: Self-pay | Admitting: Nurse Practitioner

## 2023-07-16 ENCOUNTER — Ambulatory Visit
Admission: RE | Admit: 2023-07-16 | Discharge: 2023-07-16 | Disposition: A | Discharge status: Home / Self Care | Payer: Medicare Other | Source: Ambulatory Visit | Attending: Radiation Oncology | Admitting: Radiation Oncology

## 2023-07-16 DIAGNOSIS — Z510 Encounter for antineoplastic radiation therapy: Secondary | ICD-10-CM | POA: Diagnosis not present

## 2023-07-16 DIAGNOSIS — C7951 Secondary malignant neoplasm of bone: Secondary | ICD-10-CM | POA: Diagnosis not present

## 2023-07-16 DIAGNOSIS — Z87891 Personal history of nicotine dependence: Secondary | ICD-10-CM | POA: Diagnosis not present

## 2023-07-16 DIAGNOSIS — C679 Malignant neoplasm of bladder, unspecified: Secondary | ICD-10-CM | POA: Diagnosis not present

## 2023-07-16 DIAGNOSIS — D6481 Anemia due to antineoplastic chemotherapy: Secondary | ICD-10-CM | POA: Diagnosis not present

## 2023-07-16 DIAGNOSIS — Z5111 Encounter for antineoplastic chemotherapy: Secondary | ICD-10-CM | POA: Diagnosis not present

## 2023-07-16 DIAGNOSIS — C3412 Malignant neoplasm of upper lobe, left bronchus or lung: Secondary | ICD-10-CM | POA: Diagnosis not present

## 2023-07-16 LAB — RAD ONC ARIA SESSION SUMMARY
Course Elapsed Days: 10
Plan Fractions Treated to Date: 9
Plan Fractions Treated to Date: 9
Plan Prescribed Dose Per Fraction: 3 Gy
Plan Prescribed Dose Per Fraction: 3 Gy
Plan Total Fractions Prescribed: 10
Plan Total Fractions Prescribed: 10
Plan Total Prescribed Dose: 30 Gy
Plan Total Prescribed Dose: 30 Gy
Reference Point Dosage Given to Date: 27 Gy
Reference Point Dosage Given to Date: 27 Gy
Reference Point Session Dosage Given: 3 Gy
Reference Point Session Dosage Given: 3 Gy
Session Number: 9

## 2023-07-16 NOTE — Telephone Encounter (Signed)
 Scheduled appointments per 2/5 los. Patient is aware of the made appointments.

## 2023-07-17 ENCOUNTER — Other Ambulatory Visit: Payer: Self-pay

## 2023-07-17 ENCOUNTER — Ambulatory Visit
Admission: RE | Admit: 2023-07-17 | Discharge: 2023-07-17 | Disposition: A | Discharge status: Home / Self Care | Payer: Medicare Other | Source: Ambulatory Visit | Attending: Radiation Oncology | Admitting: Radiation Oncology

## 2023-07-17 ENCOUNTER — Ambulatory Visit
Admission: RE | Admit: 2023-07-17 | Discharge: 2023-07-17 | Disposition: A | Discharge status: Home / Self Care | Payer: Medicare Other | Source: Ambulatory Visit | Attending: Radiation Oncology

## 2023-07-17 DIAGNOSIS — C3412 Malignant neoplasm of upper lobe, left bronchus or lung: Secondary | ICD-10-CM | POA: Diagnosis not present

## 2023-07-17 DIAGNOSIS — Z510 Encounter for antineoplastic radiation therapy: Secondary | ICD-10-CM | POA: Diagnosis not present

## 2023-07-17 DIAGNOSIS — D6481 Anemia due to antineoplastic chemotherapy: Secondary | ICD-10-CM | POA: Diagnosis not present

## 2023-07-17 DIAGNOSIS — C679 Malignant neoplasm of bladder, unspecified: Secondary | ICD-10-CM

## 2023-07-17 DIAGNOSIS — C7951 Secondary malignant neoplasm of bone: Secondary | ICD-10-CM | POA: Diagnosis not present

## 2023-07-17 DIAGNOSIS — Z5111 Encounter for antineoplastic chemotherapy: Secondary | ICD-10-CM | POA: Diagnosis not present

## 2023-07-17 DIAGNOSIS — Z87891 Personal history of nicotine dependence: Secondary | ICD-10-CM | POA: Diagnosis not present

## 2023-07-17 LAB — RAD ONC ARIA SESSION SUMMARY
Course Elapsed Days: 11
Plan Fractions Treated to Date: 10
Plan Fractions Treated to Date: 10
Plan Prescribed Dose Per Fraction: 3 Gy
Plan Prescribed Dose Per Fraction: 3 Gy
Plan Total Fractions Prescribed: 10
Plan Total Fractions Prescribed: 10
Plan Total Prescribed Dose: 30 Gy
Plan Total Prescribed Dose: 30 Gy
Reference Point Dosage Given to Date: 30 Gy
Reference Point Dosage Given to Date: 30 Gy
Reference Point Session Dosage Given: 3 Gy
Reference Point Session Dosage Given: 3 Gy
Session Number: 10

## 2023-07-17 NOTE — Progress Notes (Signed)
 Palliative Medicine [facility]  [phone] [phone]   Name: Evelyn Jordan Date: 07/17/2023 MRN: [id number]  DOB: 1950-09-03  Patient Care Team: Luevenia Saha, MD as PCP - General (Family Medicine) Rozelle Corning, Maricela Shoe, MD as Consulting Physician (Orthopedic Surgery) Alexandro Ide, OD as Consulting Physician (Ophthalmology) Baldwin Levee Delora Ferry, MD as Consulting Physician (Pulmonary Disease) Marlene Simas, MD as Consulting Physician (Oncology) Pickenpack-Cousar, Giles Labrum, NP as Nurse Practitioner (Hospice and Palliative Medicine)    INTERVAL HISTORY: Evelyn Jordan is a 73 y.o. 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 Palliative ask to see for symptom management and goals of care.   SOCIAL HISTORY:     reports that she quit smoking about 13 years ago. Her smoking use included cigarettes. She started smoking about 54 years ago. She has a 41 pack-year smoking history. She has never used smokeless tobacco. She reports current alcohol use. She reports that she does not use drugs.  ADVANCE DIRECTIVES:  Advanced directives on file naming Evelyn Jordan and Evelyn Jordan as the primary and secondary decision makers should the patient be unable to make her own decisions.   CODE STATUS: Full code  PAST MEDICAL HISTORY: Past Medical History:  Diagnosis Date   Depression    Fatty liver    Insomnia    Lung cancer (HCC) 07/25/2022   Osteoporosis    Pericardial effusion 03/05/2023   small noted on ECHO   Pulmonary nodule    Thyroid  nodule     ALLERGIES:  is allergic to [employer]  dimeglumine] and compazine [prochlorperazine edisylate].  MEDICATIONS:  Current Outpatient Medications  Medication Sig Dispense Refill   acetaminophen  (TYLENOL ) 325 MG tablet Take 2 tablets (650 mg total) by mouth every 6 (six) hours as needed for mild pain (pain score  1-3) (or Fever >/= 101).     ALPRAZolam  (XANAX ) 0.5 MG tablet Take 0.5 mg by mouth daily as needed for anxiety or sleep.     bisacodyl  (DULCOLAX) 5 MG EC tablet Take 10-15 mg by mouth daily as needed for moderate constipation.     eszopiclone  (LUNESTA ) 2 MG TABS tablet Take 2 mg by mouth at bedtime as needed for sleep. Take immediately before bedtime     Iron, Ferrous Sulfate, 325 (65 Fe) MG TABS Take 65 mg by mouth daily with breakfast.     levothyroxine  (SYNTHROID ) 100 MCG tablet Take 1 tablet (100 mcg total) by mouth daily at 6 (six) AM. 30 tablet 2   ondansetron  (ZOFRAN ) 8 MG tablet Take 1 tablet (8 mg total) by mouth every 8 (eight) hours as needed for nausea or vomiting. Start after day 3 of chemotherapy if needed 30 tablet 2   oxyCODONE -acetaminophen  (PERCOCET) 5-325 MG tablet Take 1 tablet by mouth every 4 (four) hours as needed. 40 tablet 0   No current facility-administered medications for this visit.    VITAL SIGNS: There were no vitals taken for this visit. There were no vitals filed for this visit.  Estimated body mass index is 20.48 kg/m as calculated from the following:   Height as of 07/13/23: 5\' 3"  (1.6 m).   Weight as of 07/15/23: 115 lb 9.6 oz (52.4 kg).   PERFORMANCE STATUS (ECOG) : 1 - Symptomatic but completely ambulatory   Physical Exam General: Fatigued Cardiovascular: regular rate and rhythm Pulmonary: clear ant fields Abdomen: soft, nontender, + bowel sounds Extremities: no edema, no  joint deformities Skin: no rashes Neurological: AAO x3  IMPRESSION:  Evelyn Jordan presents to clinic for symptom management. She is complaining of significant fatigue and exhaustion. States this has been persistent over the past several days. She associated the severe fatigue with her radiation however on exam today shows signs of chemotherapy related anemia. Lab work indicates a hemoglobin level of 7.8, a decrease from 9.0, platelets 87 from 233 on 2/3. This fatigue  impacts her ability to perform daily activities, such as showering. Shortness of breath with minimal exertion. Discussed with Cassie, PA and Mohammed confirming treatment to be held due to anemia and symptoms.   Education provided on lab findings and plan of care. We discussed the need for a blood transfusion to alleviate her symptoms. Given her symptoms will administer IVF today in place of treatment.   Appetite is good. She is pushing fluids as much as possible. No nausea, vomiting, or constipation. She has been drinking protein drinks in an attempt to increase her energy levels.  Evelyn Jordan has been taking Ambien  which she feels works better for her insomnia compared to the Lunesta . We will continue with Ambien  5-10mg  at bedtime. Pain is well controlled. She is taking Tylenol  as needed which is sufficient.   All questions answered and support provided.  I discussed the importance of continued conversation with family and their medical providers regarding overall plan of care and treatment options, ensuring decisions are within the context of the patients values and GOCs.  Assessment and Plan  Anemia secondary to chemotherapy Hemoglobin dropped from 9 to 7.8, likely contributing to patient's fatigue. Discussed the need for blood transfusion with the patient. -Order type and cross today with plans for 1uPRBC transfusion.  -Schedule blood transfusion with infusion.  -Treatment to be held per [place], PA.   Insomnia Patient reports improved sleep with Ambien . -Continue Ambien  as needed for sleep. Increase dose to 10mg  if needed.  Chemotherapy-induced thrombocytopenia Platelets dropped to 87. -Hold chemotherapy this week due to low platelets. -Reschedule chemotherapy per Oncology.  General Health Maintenance: -Continue current medications -Mild pain controlled with Tylenol . -I will plan to see patient back in 2-3 weeks. Sooner if needed. Goals of Care Realistic in her understanding.  She is clear in expressing wishes to continue to treat the treatable allowing her every opportunity to thrive while understanding her quality of life is most important. -Patient has a documented advanced directive on file.  -Her healthcare agents are listed as Stephane Ee and Evelyn Jordan.  -She is open to organ donation.  -No desire for artifical tube feedings.  -Would not want her life prolonged.  -Her health care agents are to follow her living will as documented.   Patient expressed understanding and was in agreement with this plan. She also understands that She can call the clinic at any time with any questions, concerns, or complaints.   Any controlled substances utilized were prescribed in the context of palliative care. PDMP has been reviewed.    Visit consisted of counseling and education dealing with the complex and emotionally intense issues of symptom management and palliative care in the setting of serious and potentially life-threatening illness.  Dellia Ferguson, AGPCNP-BC  Palliative Medicine Team/[facility]

## 2023-07-20 ENCOUNTER — Other Ambulatory Visit: Payer: Self-pay | Admitting: Medical Oncology

## 2023-07-20 ENCOUNTER — Inpatient Hospital Stay (HOSPITAL_BASED_OUTPATIENT_CLINIC_OR_DEPARTMENT_OTHER): Payer: Medicare Other | Admitting: Nurse Practitioner

## 2023-07-20 ENCOUNTER — Ambulatory Visit: Payer: Medicare Other | Admitting: Physician Assistant

## 2023-07-20 ENCOUNTER — Other Ambulatory Visit: Payer: Self-pay | Admitting: Physician Assistant

## 2023-07-20 ENCOUNTER — Inpatient Hospital Stay: Payer: Medicare Other

## 2023-07-20 ENCOUNTER — Ambulatory Visit: Payer: Medicare Other

## 2023-07-20 ENCOUNTER — Encounter: Payer: Self-pay | Admitting: Nurse Practitioner

## 2023-07-20 ENCOUNTER — Other Ambulatory Visit: Payer: Medicare Other

## 2023-07-20 ENCOUNTER — Encounter: Payer: Self-pay | Admitting: Internal Medicine

## 2023-07-20 VITALS — BP 128/76 | HR 98 | Temp 97.8°F | Resp 18 | Ht 63.0 in | Wt 115.8 lb

## 2023-07-20 DIAGNOSIS — G4700 Insomnia, unspecified: Secondary | ICD-10-CM | POA: Diagnosis not present

## 2023-07-20 DIAGNOSIS — Z7189 Other specified counseling: Secondary | ICD-10-CM | POA: Diagnosis not present

## 2023-07-20 DIAGNOSIS — Z95828 Presence of other vascular implants and grafts: Secondary | ICD-10-CM

## 2023-07-20 DIAGNOSIS — C3490 Malignant neoplasm of unspecified part of unspecified bronchus or lung: Secondary | ICD-10-CM

## 2023-07-20 DIAGNOSIS — C3412 Malignant neoplasm of upper lobe, left bronchus or lung: Secondary | ICD-10-CM | POA: Diagnosis not present

## 2023-07-20 DIAGNOSIS — C7951 Secondary malignant neoplasm of bone: Secondary | ICD-10-CM | POA: Diagnosis not present

## 2023-07-20 DIAGNOSIS — Z510 Encounter for antineoplastic radiation therapy: Secondary | ICD-10-CM | POA: Diagnosis not present

## 2023-07-20 DIAGNOSIS — Z515 Encounter for palliative care: Secondary | ICD-10-CM

## 2023-07-20 DIAGNOSIS — R530 Neoplastic (malignant) related fatigue: Secondary | ICD-10-CM | POA: Diagnosis not present

## 2023-07-20 DIAGNOSIS — C679 Malignant neoplasm of bladder, unspecified: Secondary | ICD-10-CM

## 2023-07-20 DIAGNOSIS — G893 Neoplasm related pain (acute) (chronic): Secondary | ICD-10-CM | POA: Diagnosis not present

## 2023-07-20 DIAGNOSIS — D6481 Anemia due to antineoplastic chemotherapy: Secondary | ICD-10-CM | POA: Diagnosis not present

## 2023-07-20 DIAGNOSIS — Z5111 Encounter for antineoplastic chemotherapy: Secondary | ICD-10-CM | POA: Diagnosis not present

## 2023-07-20 LAB — CMP (CANCER CENTER ONLY)
ALT: 20 U/L (ref 0–44)
AST: 23 U/L (ref 15–41)
Albumin: 3.6 g/dL (ref 3.5–5.0)
Alkaline Phosphatase: 82 U/L (ref 38–126)
Anion gap: 5 (ref 5–15)
BUN: 28 mg/dL — ABNORMAL HIGH (ref 8–23)
CO2: 27 mmol/L (ref 22–32)
Calcium: 9 mg/dL (ref 8.9–10.3)
Chloride: 100 mmol/L (ref 98–111)
Creatinine: 1.07 mg/dL — ABNORMAL HIGH (ref 0.44–1.00)
GFR, Estimated: 56 mL/min — ABNORMAL LOW (ref >60)
Glucose, Bld: 110 mg/dL — ABNORMAL HIGH (ref 70–99)
Potassium: 4.3 mmol/L (ref 3.5–5.1)
Sodium: 132 mmol/L — ABNORMAL LOW (ref 135–145)
Total Bilirubin: 0.2 mg/dL (ref 0.0–1.2)
Total Protein: 6.7 g/dL (ref 6.5–8.1)

## 2023-07-20 LAB — CBC WITH DIFFERENTIAL (CANCER CENTER ONLY)
Abs Immature Granulocytes: 0.01 10*3/uL (ref 0.00–0.07)
Basophils Absolute: 0 10*3/uL (ref 0.0–0.1)
Basophils Relative: 0 %
Eosinophils Absolute: 0 10*3/uL (ref 0.0–0.5)
Eosinophils Relative: 1 %
HCT: 23.4 % — ABNORMAL LOW (ref 36.0–46.0)
Hemoglobin: 7.8 g/dL — ABNORMAL LOW (ref 12.0–15.0)
Immature Granulocytes: 0 %
Lymphocytes Relative: 4 %
Lymphs Abs: 0.1 10*3/uL — ABNORMAL LOW (ref 0.7–4.0)
MCH: 29.7 pg (ref 26.0–34.0)
MCHC: 33.3 g/dL (ref 30.0–36.0)
MCV: 89 fL (ref 80.0–100.0)
Monocytes Absolute: 0.2 10*3/uL (ref 0.1–1.0)
Monocytes Relative: 8 %
Neutro Abs: 2.2 10*3/uL (ref 1.7–7.7)
Neutrophils Relative %: 87 %
Platelet Count: 87 10*3/uL — ABNORMAL LOW (ref 150–400)
RBC: 2.63 MIL/uL — ABNORMAL LOW (ref 3.87–5.11)
RDW: 16.1 % — ABNORMAL HIGH (ref 11.5–15.5)
WBC Count: 2.5 10*3/uL — ABNORMAL LOW (ref 4.0–10.5)
nRBC: 0 % (ref 0.0–0.2)

## 2023-07-20 LAB — PREPARE RBC (CROSSMATCH)

## 2023-07-20 MED ORDER — ZOLPIDEM TARTRATE 5 MG PO TABS: 5.0000 mg | ORAL_TABLET | Freq: Every evening | ORAL | 0 refills | Status: DC | PRN

## 2023-07-20 MED ORDER — HEPARIN SOD (PORK) LOCK FLUSH 100 UNIT/ML IV SOLN
500.0000 [IU] | Freq: Once | INTRAVENOUS | Status: AC
  Administered 2023-07-20: 500 [IU]

## 2023-07-20 MED ORDER — SODIUM CHLORIDE 0.9 % IV SOLN: INTRAVENOUS | Status: DC

## 2023-07-20 MED ORDER — SODIUM CHLORIDE 0.9% FLUSH
10.0000 mL | Freq: Once | INTRAVENOUS | Status: AC
  Administered 2023-07-20: 10 mL

## 2023-07-20 NOTE — Progress Notes (Signed)
 Sample to bb order entered.

## 2023-07-20 NOTE — Progress Notes (Signed)
 Order was for IVF to run 1L/2 hours, ok per Landa Pine, NP to run over 1.5 hours

## 2023-07-20 NOTE — Patient Instructions (Signed)

## 2023-07-20 NOTE — Progress Notes (Signed)
 BB orders entered

## 2023-07-20 NOTE — Radiation Completion Notes (Addendum)
  Radiation Oncology         [phone] ________________________________  Name: Evelyn Jordan MRN: [id number]  Date: 07/17/2023  DOB: June 03, 1951  Referring Physician: Lonni Han, M.D. Date of Service: 2023-07-20 Radiation Oncologist: Adina Barge, M.D. [facility]     RADIATION ONCOLOGY END OF TREATMENT NOTE     Diagnosis: 73 y/o woman with with painful osseous metastases at T12 and the left sacrum secondary to metastatic urothelial cell carcinoma, pT4 with squamous cell component   Intent: Palliative     ==========DELIVERED PLANS==========  First Treatment Date: 2023-07-06 Last Treatment Date: 2023-07-17   Plan Name: Spine_T12 Site: Thoracic Spine Technique: 3D Mode: Photon Dose Per Fraction: 3 Gy Prescribed Dose (Delivered / Prescribed): 30 Gy / 30 Gy Prescribed Fxs (Delivered / Prescribed): 10 / 10   Plan Name: Pelvis_L_Sacr Site: Hip, Left Technique: 3D Mode: Photon Dose Per Fraction: 3 Gy Prescribed Dose (Delivered / Prescribed): 30 Gy / 30 Gy Prescribed Fxs (Delivered / Prescribed): 10 / 10     ==========ON TREATMENT VISIT DATES========== 2023-07-10, 2023-07-17   See weekly On Treatment Notes in Epic for details in the Media tab (listed as Progress notes on the On Treatment Visit Dates listed above).  She tolerated the radiation treatment well without modest fatigue.  The patient will receive a call in about one month from the radiation oncology department. She will continue follow up with her medical oncologist, Dr. Gatha, and urologist, Dr. Han, as well.  ------------------------------------------------   [name], MD [facility]  Radiation Oncology Direct Dial: [phone]  Fax: [phone] [email or web address]  Skype  LinkedIn

## 2023-07-21 ENCOUNTER — Inpatient Hospital Stay: Payer: Medicare Other

## 2023-07-21 ENCOUNTER — Encounter: Payer: Self-pay | Admitting: Internal Medicine

## 2023-07-21 DIAGNOSIS — C679 Malignant neoplasm of bladder, unspecified: Secondary | ICD-10-CM | POA: Diagnosis not present

## 2023-07-21 DIAGNOSIS — C7951 Secondary malignant neoplasm of bone: Secondary | ICD-10-CM | POA: Diagnosis not present

## 2023-07-21 DIAGNOSIS — Z5111 Encounter for antineoplastic chemotherapy: Secondary | ICD-10-CM | POA: Diagnosis not present

## 2023-07-21 DIAGNOSIS — D6481 Anemia due to antineoplastic chemotherapy: Secondary | ICD-10-CM | POA: Diagnosis not present

## 2023-07-21 DIAGNOSIS — C3412 Malignant neoplasm of upper lobe, left bronchus or lung: Secondary | ICD-10-CM | POA: Diagnosis not present

## 2023-07-21 DIAGNOSIS — Z510 Encounter for antineoplastic radiation therapy: Secondary | ICD-10-CM | POA: Diagnosis not present

## 2023-07-21 DIAGNOSIS — C3490 Malignant neoplasm of unspecified part of unspecified bronchus or lung: Secondary | ICD-10-CM

## 2023-07-21 MED ORDER — ACETAMINOPHEN 325 MG PO TABS
650.0000 mg | ORAL_TABLET | Freq: Once | ORAL | Status: DC
Start: 2023-07-21 — End: 2023-07-21

## 2023-07-21 MED ORDER — SODIUM CHLORIDE 0.9% IV SOLUTION
250.0000 mL | INTRAVENOUS | Status: DC
  Administered 2023-07-21: 250 mL via INTRAVENOUS

## 2023-07-21 MED ORDER — DIPHENHYDRAMINE HCL 25 MG PO CAPS
25.0000 mg | ORAL_CAPSULE | Freq: Once | ORAL | Status: DC
Start: 2023-07-21 — End: 2023-07-21

## 2023-07-21 NOTE — Patient Instructions (Signed)

## 2023-07-22 LAB — BPAM RBC
Blood Product Expiration Date: 202503082359
ISSUE DATE / TIME: 202502111044
Unit Type and Rh: 202503082359
Unit Type and Rh: 6200

## 2023-07-22 LAB — TYPE AND SCREEN
ABO/RH(D): A POS
Antibody Screen: NEGATIVE
Unit division: 0

## 2023-07-23 ENCOUNTER — Other Ambulatory Visit: Payer: Self-pay | Admitting: Nurse Practitioner

## 2023-07-23 DIAGNOSIS — R530 Neoplastic (malignant) related fatigue: Secondary | ICD-10-CM

## 2023-07-23 DIAGNOSIS — C3490 Malignant neoplasm of unspecified part of unspecified bronchus or lung: Secondary | ICD-10-CM

## 2023-07-24 ENCOUNTER — Other Ambulatory Visit: Payer: Self-pay | Admitting: Physician Assistant

## 2023-07-24 ENCOUNTER — Inpatient Hospital Stay: Payer: Medicare Other

## 2023-07-24 ENCOUNTER — Other Ambulatory Visit: Payer: Medicare Other

## 2023-07-24 ENCOUNTER — Other Ambulatory Visit: Payer: Self-pay

## 2023-07-24 ENCOUNTER — Inpatient Hospital Stay: Payer: Medicare Other | Admitting: Physician Assistant

## 2023-07-24 VITALS — BP 126/83 | HR 96 | Temp 98.3°F | Resp 16 | Wt 115.5 lb

## 2023-07-24 DIAGNOSIS — C3492 Malignant neoplasm of unspecified part of left bronchus or lung: Secondary | ICD-10-CM | POA: Diagnosis not present

## 2023-07-24 DIAGNOSIS — D696 Thrombocytopenia, unspecified: Secondary | ICD-10-CM

## 2023-07-24 DIAGNOSIS — R5383 Other fatigue: Secondary | ICD-10-CM | POA: Diagnosis not present

## 2023-07-24 DIAGNOSIS — C679 Malignant neoplasm of bladder, unspecified: Secondary | ICD-10-CM

## 2023-07-24 DIAGNOSIS — Z510 Encounter for antineoplastic radiation therapy: Secondary | ICD-10-CM | POA: Diagnosis not present

## 2023-07-24 DIAGNOSIS — Z515 Encounter for palliative care: Secondary | ICD-10-CM

## 2023-07-24 DIAGNOSIS — C3490 Malignant neoplasm of unspecified part of unspecified bronchus or lung: Secondary | ICD-10-CM

## 2023-07-24 DIAGNOSIS — C3412 Malignant neoplasm of upper lobe, left bronchus or lung: Secondary | ICD-10-CM | POA: Diagnosis not present

## 2023-07-24 DIAGNOSIS — R0602 Shortness of breath: Secondary | ICD-10-CM | POA: Diagnosis not present

## 2023-07-24 DIAGNOSIS — Z5111 Encounter for antineoplastic chemotherapy: Secondary | ICD-10-CM | POA: Diagnosis not present

## 2023-07-24 DIAGNOSIS — G4700 Insomnia, unspecified: Secondary | ICD-10-CM

## 2023-07-24 DIAGNOSIS — R530 Neoplastic (malignant) related fatigue: Secondary | ICD-10-CM

## 2023-07-24 DIAGNOSIS — C7800 Secondary malignant neoplasm of unspecified lung: Secondary | ICD-10-CM

## 2023-07-24 DIAGNOSIS — D6481 Anemia due to antineoplastic chemotherapy: Secondary | ICD-10-CM | POA: Diagnosis not present

## 2023-07-24 DIAGNOSIS — C7951 Secondary malignant neoplasm of bone: Secondary | ICD-10-CM | POA: Diagnosis not present

## 2023-07-24 LAB — CBC WITH DIFFERENTIAL (CANCER CENTER ONLY)
Abs Immature Granulocytes: 0.02 10*3/uL (ref 0.00–0.07)
Basophils Absolute: 0 10*3/uL (ref 0.0–0.1)
Basophils Relative: 0 %
Eosinophils Absolute: 0.1 10*3/uL (ref 0.0–0.5)
Eosinophils Relative: 2 %
HCT: 28.6 % — ABNORMAL LOW (ref 36.0–46.0)
Hemoglobin: 9.6 g/dL — ABNORMAL LOW (ref 12.0–15.0)
Immature Granulocytes: 1 %
Lymphocytes Relative: 3 %
Lymphs Abs: 0.1 10*3/uL — ABNORMAL LOW (ref 0.7–4.0)
MCH: 29.7 pg (ref 26.0–34.0)
MCHC: 33.6 g/dL (ref 30.0–36.0)
MCV: 88.5 fL (ref 80.0–100.0)
Monocytes Absolute: 0.5 10*3/uL (ref 0.1–1.0)
Monocytes Relative: 12 %
Neutro Abs: 3.3 10*3/uL (ref 1.7–7.7)
Neutrophils Relative %: 82 %
Platelet Count: 57 10*3/uL — ABNORMAL LOW (ref 150–400)
RBC: 3.23 MIL/uL — ABNORMAL LOW (ref 3.87–5.11)
RDW: 15.3 % (ref 11.5–15.5)
WBC Count: 4 10*3/uL (ref 4.0–10.5)
nRBC: 0 % (ref 0.0–0.2)

## 2023-07-24 LAB — CMP (CANCER CENTER ONLY)
ALT: 17 U/L (ref 0–44)
AST: 16 U/L (ref 15–41)
Albumin: 3.9 g/dL (ref 3.5–5.0)
Alkaline Phosphatase: 76 U/L (ref 38–126)
Anion gap: 6 (ref 5–15)
BUN: 26 mg/dL — ABNORMAL HIGH (ref 8–23)
CO2: 29 mmol/L (ref 22–32)
Calcium: 9.5 mg/dL (ref 8.9–10.3)
Chloride: 101 mmol/L (ref 98–111)
Creatinine: 1.19 mg/dL — ABNORMAL HIGH (ref 0.44–1.00)
GFR, Estimated: 49 mL/min — ABNORMAL LOW (ref >60)
Glucose, Bld: 89 mg/dL (ref 70–99)
Potassium: 4.4 mmol/L (ref 3.5–5.1)
Sodium: 136 mmol/L (ref 135–145)
Total Bilirubin: 0.2 mg/dL (ref 0.0–1.2)
Total Protein: 7.2 g/dL (ref 6.5–8.1)

## 2023-07-24 LAB — SAMPLE TO BLOOD BANK

## 2023-07-24 LAB — MAGNESIUM: Magnesium: 2.2 mg/dL (ref 1.7–2.4)

## 2023-07-24 MED ORDER — METHYLPREDNISOLONE 4 MG PO TBPK: ORAL_TABLET | ORAL | 0 refills | Status: DC

## 2023-07-24 MED ORDER — ZOLPIDEM TARTRATE 5 MG PO TABS: 5.0000 mg | ORAL_TABLET | Freq: Every evening | ORAL | 0 refills | Status: DC | PRN

## 2023-07-24 NOTE — Progress Notes (Signed)
Symptom Management Consult Note Evelyn Jordan    Patient Care Team: [name], MD as PCP - General (Family Medicine) August Saucer, Corrie Mckusick, MD as Consulting Physician (Orthopedic Surgery) Canary Brim, OD as Consulting Physician (Ophthalmology) Delton Coombes Les Pou, MD as Consulting Physician (Pulmonary Disease) Si Gaul, MD as Consulting Physician (Oncology) Pickenpack-Cousar, Arty Baumgartner, NP as Nurse Practitioner [facility] and [facility])    Name / MRN / DOB: Evelyn Jordan  [id number]  1950/10/05   Date of visit: 07/24/2023   Chief Complaint/Reason for visit: fatigue   Current Therapy:  Carboplatin for AUC of 5 on day 1 and gemcitabine 1000 Mg/M2  on days 1 and 8 IV every 3 weeks   Last treatment:  Day 1   Cycle 2 on 07/13/23   ASSESSMENT & PLAN: Patient is a 73 y.o. 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 Arbutus Ped.  I have viewed most recent oncology note and lab work.    #Metastatic lung and urothelial carcinoma   - Next appointment with oncologist is 07/21/23 to review scan  #Shortness of breath -PE with diffuse expiratory wheeze, no edema.. VSS. No hypoxia with ambulation in clinic. -Will prescribe a trial of steroids to manage symptoms. -Discussed with med onc PA Cassie H regarding imaging. Patient is not due for restaging scan until after her next cycle (#3) however with the severity of her symptoms and concern for disease progression. We will order a STAT CT CAP for 07/26/23 -Patient declines chest xray today. This is reasonable as her symptoms, labs, and exam are not consistent with infection or pleural effusion. Discussed outpatient vs inpatient symptom management and patient prefers to continue to manage symptoms outpatient.  #Fatigue - Had blood transfusion 07/21/23, HgB was 7.8.   - HgB today is 9.6. No indication for transfusion today. - Chart reviews shows elevated TSH   with normal T4 on 06/16/23. She does have history of hypothyroidism.  - PE reassuring with regular heart rhythm. - Suspect her fatigue is AE of Gemzar (40%).  #Thrombocytopenia -Platelet count today is 57k, likely due to chemotherapy. No signs of abnormal bleeding or easy bruising reported. -Monitor platelet count closely. -Advise patient to report any signs of abnormal bleeding or bruising.   Strict ED precautions discussed should symptoms worsen.   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   Evelyn Jordan is a 73 y.o. female with oncologic history as above presenting to [facility] today with chief complaint of fatigue and shortness of breath. Patient is accompanied by her spouse  who provides additional history.  She has been experiencing severe fatigue and shortness of breath for the past week to week and  a half. Exhaustion occurs after minimal exertion, such as showering or climbing stairs, leaving her breathless and requiring 15 to 20 minutes to recover. These symptoms are constant and have not improved since onset. No fever, and her eating and drinking habits remain normal. Shortness of breath occurs during activities like showering, prompting her husband to stay nearby for safety. She notes that her symptoms began to worsen after a trip to [facility] on January 29th, where she first noticed shortness of breath.  She reports wheezing, which she attributes to her lung cancer, and has tried using an albuterol inhaler in the past without relief. She has no history of COPD or asthma. She previously used steroids for pneumonitis following Infimzi treatment over the summer, which improved her symptoms at that time. She recalls being very wheezy and coughing during that episode but not experiencing the current level of exhaustion. She had a blood transfusion earlier this week but did not notice any improvement in her symptoms. Patient denies any chest pain or leg swelling. She denies any cardiac history.  ROS  All other systems are reviewed and are negative for acute change except as noted in the HPI.    Allergies  Allergen Reactions   Emend [Fosaprepitant Dimeglumine] Other (See Comments)    Chest tightness with first Emend reaction.  See Progress Note 06/16/23.   Compazine [Prochlorperazine Edisylate] Other (See Comments)    "Neck hyperextends"     Past Medical History:  Diagnosis Date   Depression    Fatty liver    Insomnia    Lung cancer (HCC) 07/25/2022   Osteoporosis    Pericardial effusion 03/05/2023   small noted on ECHO   Pulmonary nodule    Thyroid nodule      Past Surgical History:  Procedure Laterality Date   ANKLE SURGERY Right    BREAST BIOPSY Right    benign 2013   BRONCHIAL BIOPSY  08/04/2022   Procedure: BRONCHIAL BIOPSIES;  Surgeon: Leslye Peer, MD;   Location: [facility] ENDOSCOPY;  Service: Cardiopulmonary;;   BRONCHIAL BIOPSY  06/22/2023   Procedure: BRONCHIAL BIOPSIES;  Surgeon: Leslye Peer, MD;  Location: [facility] ENDOSCOPY;  Service: Pulmonary;;   BRONCHIAL BRUSHINGS  08/04/2022   Procedure: BRONCHIAL BRUSHINGS;  Surgeon: Leslye Peer, MD;  Location: [facility] ENDOSCOPY;  Service: Cardiopulmonary;;   BRONCHIAL BRUSHINGS  06/22/2023   Procedure: BRONCHIAL BRUSHINGS;  Surgeon: Leslye Peer, MD;  Location: [facility] ENDOSCOPY;  Service: Pulmonary;;   BRONCHIAL NEEDLE ASPIRATION BIOPSY  06/22/2023   Procedure: BRONCHIAL NEEDLE ASPIRATION BIOPSIES;  Surgeon: Leslye Peer, MD;  Location: [facility] ENDOSCOPY;  Service: Pulmonary;;   BRONCHIAL WASHINGS  06/22/2023   Procedure: BRONCHIAL WASHINGS;  Surgeon: Leslye Peer, MD;  Location: [facility] ENDOSCOPY;  Service: Pulmonary;;   COLONOSCOPY     CYSTOSCOPY WITH BIOPSY N/A 05/15/2023   Procedure: CYSTOSCOPY WITH RIGHT RENAL PELVIS BIOPSY;  Surgeon: Rene Paci, MD;  Location: [facility] ORS;  Service: Urology;  Laterality: N/A;   CYSTOSCOPY WITH STENT PLACEMENT Right 05/26/2023   Procedure: CYSTOSCOPY WITH RIGHT STENT PLACEMENT; TUR  OF RIGHT URETERAL ORISICE;  Surgeon: Rene Paci, MD;  Location: [facility] ORS;  Service: Urology;  Laterality: Right;   CYSTOSCOPY/RETROGRADE/URETEROSCOPY N/A 05/15/2023   Procedure: CYSTOSCOPY, RIGHT RETROGRADE, RIGHT URETEROSCOPY, RIGHT STENT PLACEMENT;  Surgeon: Rene Paci, MD;  Location: [facility] ORS;  Service: Urology;  Laterality: N/A;  30 MINUTES   HEMOSTASIS CONTROL  08/04/2022   Procedure: HEMOSTASIS CONTROL;  Surgeon: Leslye Peer, MD;  Location: [facility] ENDOSCOPY;  Service: Cardiopulmonary;;   IR IMAGING GUIDED PORT INSERTION  09/04/2022   repair of a torn meniscus Left 05/2018   ROBOT ASSITED LAPAROSCOPIC NEPHROURETERECTOMY Right 05/26/2023   Procedure: XI ROBOT ASSITED LAPAROSCOPIC NEPHROURETERECTOMY;  Surgeon: Rene Paci, MD;  Location: [facility] ORS;   Service: Urology;  Laterality: Right;  210 MINS FOR CASE   VIDEO BRONCHOSCOPY Left 08/04/2022   Procedure: VIDEO BRONCHOSCOPY WITH FLUORO;  Surgeon: Leslye Peer, MD;  Location[facility] ENDOSCOPY;  Service: Cardiopulmonary;  Laterality: Left;    Social History   Socioeconomic History   Marital status: Married    Spouse name: Archie   Number of children: Not on file   Years of education: Not on file   Highest education level: Bachelor's degree (e.g., BA, AB, BS)  Occupational History    Comment: retired Engineer, civil (consulting)   Tobacco Use   Smoking status: Former    Current packs/day: 0.00    Average packs/day: 1 pack/day for 41.0 years (41.0 ttl pk-yrs)    Types: Cigarettes    Start date: 35    Quit date: 2012    Years since quitting: 13.1   Smokeless tobacco: Never  Vaping Use   Vaping status: Never Used  Substance and Sexual Activity   Alcohol use: Yes    Comment: Rarely   Drug use: No   Sexual activity: Not on file  Other Topics Concern   Not on file  Social History Narrative   Not on file   Social Drivers of Health   Financial Resource Strain: Low Risk  (04/29/2023)   Overall Financial Resource Strain (CARDIA)    Difficulty of Paying Living Expenses: Not hard at all  Food Insecurity: No Food Insecurity (06/23/2023)   Hunger Vital Sign    Worried About Running Out of Food in the Last Year: Never true    Ran Out of Food in the Last Year: Never true  Transportation Needs: No Transportation Needs (06/23/2023)   PRAPARE - Administrator, Civil Service (Medical): No    Lack of Transportation (Non-Medical): No  Physical Activity: Sufficiently Active (04/29/2023)   Exercise Vital Sign    Days of Exercise per Week: 4 days    Minutes of Exercise per Session: 40 min  Stress: No Stress Concern Present (04/29/2023)   [employer] of Occupational Health - Occupational Stress Questionnaire    Feeling of Stress : Only a little  Social Connections: Unknown (04/29/2023)    Social Connection and Isolation Panel [NHANES]    Frequency of Communication with Friends and Family: Once a week    Frequency of Social Gatherings with Friends and Family: Once a week    Attends Religious Services: Patient declined    Database administrator or Organizations: No    Attends Banker Meetings: Never    Marital Status: Married  Catering manager Violence: Not At Risk (06/23/2023)   Humiliation, Afraid, Rape, and Kick questionnaire    Fear of Current or Ex-Partner: No    Emotionally Abused: No  Physically Abused: No    Sexually Abused: No    Family History  Problem Relation Age of Onset   Alzheimer's disease Mother    Hypertension Father    Stroke Father    Hyperlipidemia Brother    Breast cancer Neg Hx      Current Outpatient Medications:    methylPREDNISolone (MEDROL DOSEPAK) 4 MG TBPK tablet, Take 6 pills by mouth day 1, 5 on day 2, 4 on day 3, 3 on day 4, 2 on day 5, 1 on day 6, Disp: 21 tablet, Rfl: 0   acetaminophen (TYLENOL) 325 MG tablet, Take 2 tablets (650 mg total) by mouth every 6 (six) hours as needed for mild pain (pain score 1-3) (or Fever >/= 101)., Disp: , Rfl:    ALPRAZolam (XANAX) 0.5 MG tablet, Take 0.5 mg by mouth daily as needed for anxiety or sleep., Disp: , Rfl:    bisacodyl (DULCOLAX) 5 MG EC tablet, Take 10-15 mg by mouth daily as needed for moderate constipation., Disp: , Rfl:    Iron, Ferrous Sulfate, 325 (65 Fe) MG TABS, Take 65 mg by mouth daily with breakfast., Disp: , Rfl:    levothyroxine (SYNTHROID) 100 MCG tablet, Take 1 tablet (100 mcg total) by mouth daily at 6 (six) AM., Disp: 30 tablet, Rfl: 2   ondansetron (ZOFRAN) 8 MG tablet, Take 1 tablet (8 mg total) by mouth every 8 (eight) hours as needed for nausea or vomiting. Start after day 3 of chemotherapy if needed, Disp: 30 tablet, Rfl: 2   oxyCODONE-acetaminophen (PERCOCET) 5-325 MG tablet, Take 1 tablet by mouth every 4 (four) hours as needed., Disp: 40 tablet, Rfl: 0    zolpidem (AMBIEN) 5 MG tablet, Take 1-2 tablets (5-10 mg total) by mouth at bedtime as needed for sleep., Disp: 30 tablet, Rfl: 0  PHYSICAL EXAM: ECOG FS:1 - Symptomatic but completely ambulatory    Vitals:   07/24/23 1320  BP: 126/83  Pulse: 96  Resp: 16  Temp: 98.3 F (36.8 C)  TempSrc: Oral  SpO2: 100%  Weight: 115 lb 8 oz (52.4 kg)      Physical Exam Vitals reviewed.  Constitutional:      Appearance: She is not ill-appearing or toxic-appearing.  HENT:     Head: Normocephalic.  Eyes:     General: No scleral icterus.    Conjunctiva/sclera: Conjunctivae normal.  Cardiovascular:     Rate and Rhythm: Normal rate and regular rhythm.     Heart sounds: Normal heart sounds.  Pulmonary:     Effort: Pulmonary effort is normal.     Breath sounds: No stridor. Wheezing (diffuse, expiratory) present. No rhonchi or rales.  Abdominal:     General: There is no distension.  Musculoskeletal:        General: Normal range of motion.     Cervical back: Normal range of motion.     Right lower leg: No edema.     Left lower leg: No edema.  Skin:    General: Skin is warm and dry.  Neurological:     Mental Status: She is alert.      LABORATORY DATA: I have reviewed the data as listed    Latest Ref Rng & Units 07/24/2023    1:16 PM 07/20/2023    2:32 PM 07/13/2023    7:34 AM  CBC  WBC 4.0 - 10.5 K/uL 4.0  2.5  6.0   Hemoglobin 12.0 - 15.0 g/dL 9.6  7.8  9.0   Hematocrit  36.0 - 46.0 % 28.6  23.4  27.5   Platelets 150 - 400 K/uL 57  87  233         Latest Ref Rng & Units 07/24/2023    1:16 PM 07/20/2023    2:32 PM 07/13/2023    7:34 AM  CMP  Glucose 70 - 99 mg/dL 89  161  096   BUN 8 - 23 mg/dL 26  28  29    Creatinine 0.44 - 1.00 mg/dL 0.45  4.09  8.11   Sodium 135 - 145 mmol/L 136  132  136   Potassium 3.5 - 5.1 mmol/L 4.4  4.3  4.1   Chloride 98 - 111 mmol/L 101  100  103   CO2 22 - 32 mmol/L 29  27  25    Calcium 8.9 - 10.3 mg/dL 9.5  9.0  9.0   Total Protein 6.5 - 8.1  g/dL 7.2  6.7  6.8   Total Bilirubin 0.0 - 1.2 mg/dL 0.2  0.2  0.3   Alkaline Phos 38 - 126 U/L 76  82  72   AST 15 - 41 U/L 16  23  14    ALT 0 - 44 U/L 17  20  15         RADIOGRAPHIC STUDIES (from last 24 hours if applicable) I have personally reviewed the radiological images as listed and agreed with the findings in the report. No results found.      Visit Diagnosis: 1. 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 Non-small cell cancer of left lung (HCC)   3. Thrombocytopenia (HCC)   4. Other fatigue   5. SOB (shortness of breath)      No orders of the defined types were placed in this encounter.   All questions were answered. The patient knows to call the clinic with any problems, questions or concerns. No barriers to learning was detected.  A total of more than 30 minutes were spent on this encounter with face-to-face time and non-face-to-face time, including preparing to see the patient, ordering tests and/or medications, counseling the patient and coordination of care as outlined above.    Thank you for allowing me to participate in the care of this patient.    Shanon Ace, PA-C Department of Hematology/Oncology [facility] Phone: [phone]  [phone]    07/24/2023 3:16 PM

## 2023-07-24 NOTE — Progress Notes (Signed)
STAT chest xray ordered for [facility] visit today.

## 2023-07-26 ENCOUNTER — Ambulatory Visit (HOSPITAL_BASED_OUTPATIENT_CLINIC_OR_DEPARTMENT_OTHER)
Admission: RE | Admit: 2023-07-26 | Discharge: 2023-07-26 | Disposition: A | Discharge status: Home / Self Care | Payer: Medicare Other | Source: Ambulatory Visit | Attending: Physician Assistant

## 2023-07-26 DIAGNOSIS — N12 Tubulo-interstitial nephritis, not specified as acute or chronic: Secondary | ICD-10-CM | POA: Diagnosis not present

## 2023-07-26 DIAGNOSIS — I3139 Other pericardial effusion (noninflammatory): Secondary | ICD-10-CM | POA: Diagnosis not present

## 2023-07-26 DIAGNOSIS — C7800 Secondary malignant neoplasm of unspecified lung: Secondary | ICD-10-CM | POA: Diagnosis not present

## 2023-07-26 DIAGNOSIS — C3490 Malignant neoplasm of unspecified part of unspecified bronchus or lung: Secondary | ICD-10-CM | POA: Insufficient documentation

## 2023-07-26 DIAGNOSIS — C7951 Secondary malignant neoplasm of bone: Secondary | ICD-10-CM | POA: Diagnosis not present

## 2023-07-26 DIAGNOSIS — R0602 Shortness of breath: Secondary | ICD-10-CM | POA: Diagnosis not present

## 2023-07-26 DIAGNOSIS — C679 Malignant neoplasm of bladder, unspecified: Secondary | ICD-10-CM | POA: Diagnosis not present

## 2023-07-26 MED ORDER — IOHEXOL 300 MG/ML  SOLN
75.0000 mL | Freq: Once | INTRAMUSCULAR | Status: AC | PRN
  Administered 2023-07-26: 75 mL via INTRAVENOUS

## 2023-07-27 ENCOUNTER — Encounter: Payer: Self-pay | Admitting: Internal Medicine

## 2023-07-27 ENCOUNTER — Ambulatory Visit: Payer: Medicare Other

## 2023-07-27 ENCOUNTER — Telehealth: Payer: Self-pay

## 2023-07-27 ENCOUNTER — Other Ambulatory Visit: Payer: Medicare Other

## 2023-07-27 ENCOUNTER — Ambulatory Visit: Payer: Medicare Other | Admitting: Physician Assistant

## 2023-07-27 NOTE — Progress Notes (Unsigned)
[facility] OFFICE PROGRESS NOTE  Evelyn Ora, MD [address] [place] [address]  DIAGNOSIS: 1) Stage IIIB (T3, N2, M0) non-small cell lung cancer, squamous cell carcinoma presented with obstructive left lingular mass with consolidation and collapse of the lingula as well as part of the left upper lobe and likely invasion of the mediastinum diagnosed in February 2024.  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   Biomarker Findings Microsatellite status - MS-Stable Tumor Mutational Burden - 5 Muts/Mb Genomic Findings For a complete list of the genes assayed, please refer to the Appendix. CCND1 amplification FGFR1 amplification MDM2 amplification FGF19 amplification FGF3 amplification FGF4 amplification TP53 Q136* 8 Disease relevant genes with no reportable alterations: ALK, BRAF, EGFR, ERBB2, KRAS, MET, RET, ROS1   PDL1: 5%  PRIOR THERAPY: 1) Concurrent chemoradiation with weekly carboplatin for AUC of 2 and paclitaxel 45 Mg/M2. Status post 7 weeks of treatment.  Last dose was given October 06, 2022 with partial response. 2) Consolidation treatment with immunotherapy with Imfinzi 1500 Mg IV every 4 weeks.  First dose on November 13, 2022.  Status post 2 cycles.  This was discontinued secondary to toxicity and suspicious immunotherapy mediated pneumonitis. 3) Right Kidney and Ureter Nephrectomy under the care of Dr. Sande Brothers. Tumor extensively involves renal pelvis, extends through the kidney into the perinephric fat with lymphovascular invasion and positive margin at the peri soft tissue of the renal vein  CURRENT THERAPY: Carboplatin for AUC of 5 on day 1 and gemcitabine 1000 Mg/M2  on days 1 and 8 IV every 3 weeks. First dose on 06/15/2023.  Status post 2 cycles.   INTERVAL HISTORY: Evelyn Jordan 73 y.o. female returns to the clinic today for a follow up visit accompanied by ***. She currently  undergoing systemic chemotherapy for urothelial carcinoma and lung cancer.  She is status post 2 cycles.  The patient is followed closely by palliative care.  She also was seen in the [place] management clinic last week on 07/24/2023 for the chief complaint of shortness of breath.  Therefore the decision was made to arrange for restaging scan after cycle #2 as opposed to cycle #3.  The patient felt that if her treatment was not working, she would rather know sooner rather than later so she can pursue palliative treatment.  Therefore she had a restaging scan and she is here today to review the scan results.  Day 8 of cycle #2 was thrombocytopenia  She denies any abnormal bleeding and bruising.   ***Anemia.   Since last being seen her energy is***.  She denies any fever, chills, or night sweats.  Weight loss?  Appetite?  Shortness of breath?  Cough?  She denies any hemoptysis or chest pain.  Denies any nausea, vomiting, diarrhea, or constipation.  Abdominal pain?  Denies any hematuria, just urea, or malodorous urine.  She is here today for evaluation and to review her scan results.    MEDICAL HISTORY: Past Medical History:  Diagnosis Date   Depression    Fatty liver    Insomnia    Lung cancer (HCC) 07/25/2022   Osteoporosis    Pericardial effusion 03/05/2023   small noted on ECHO   Pulmonary nodule    Thyroid nodule     ALLERGIES:  is allergic to emend [fosaprepitant dimeglumine] and compazine [prochlorperazine edisylate].  MEDICATIONS:  Current Outpatient Medications  Medication Sig Dispense Refill   acetaminophen (TYLENOL) 325 MG tablet  Take 2 tablets (650 mg total) by mouth every 6 (six) hours as needed for mild pain (pain score 1-3) (or Fever >/= 101).     ALPRAZolam (XANAX) 0.5 MG tablet Take 0.5 mg by mouth daily as needed for anxiety or sleep.     bisacodyl (DULCOLAX) 5 MG EC tablet Take 10-15 mg by mouth daily as needed for moderate constipation.     Iron, Ferrous Sulfate, 325 (65  Fe) MG TABS Take 65 mg by mouth daily with breakfast.     levothyroxine (SYNTHROID) 100 MCG tablet Take 1 tablet (100 mcg total) by mouth daily at 6 (six) AM. 30 tablet 2   methylPREDNISolone (MEDROL DOSEPAK) 4 MG TBPK tablet Take 6 pills by mouth day 1, 5 on day 2, 4 on day 3, 3 on day 4, 2 on day 5, 1 on day 6 21 tablet 0   ondansetron (ZOFRAN) 8 MG tablet Take 1 tablet (8 mg total) by mouth every 8 (eight) hours as needed for nausea or vomiting. Start after day 3 of chemotherapy if needed 30 tablet 2   oxyCODONE-acetaminophen (PERCOCET) 5-325 MG tablet Take 1 tablet by mouth every 4 (four) hours as needed. 40 tablet 0   zolpidem (AMBIEN) 5 MG tablet Take 1-2 tablets (5-10 mg total) by mouth at bedtime as needed for sleep. 30 tablet 0   No current facility-administered medications for this visit.    SURGICAL HISTORY:  Past Surgical History:  Procedure Laterality Date   ANKLE SURGERY Right    BREAST BIOPSY Right    benign 2013   BRONCHIAL BIOPSY  08/04/2022   Procedure: BRONCHIAL BIOPSIES;  Surgeon: Leslye Peer, MD;  Location: [facility] ENDOSCOPY;  Service: Cardiopulmonary;;   BRONCHIAL BIOPSY  06/22/2023   Procedure: BRONCHIAL BIOPSIES;  Surgeon: Leslye Peer, MD;  Location: [facility] ENDOSCOPY;  Service: Pulmonary;;   BRONCHIAL BRUSHINGS  08/04/2022   Procedure: BRONCHIAL BRUSHINGS;  Surgeon: Leslye Peer, MD;  Location: [facility] ENDOSCOPY;  Service: Cardiopulmonary;;   BRONCHIAL BRUSHINGS  06/22/2023   Procedure: BRONCHIAL BRUSHINGS;  Surgeon: Leslye Peer, MD;  Location: [facility] ENDOSCOPY;  Service: Pulmonary;;   BRONCHIAL NEEDLE ASPIRATION BIOPSY  06/22/2023   Procedure: BRONCHIAL NEEDLE ASPIRATION BIOPSIES;  Surgeon: Leslye Peer, MD;  Location: [facility] ENDOSCOPY;  Service: Pulmonary;;   BRONCHIAL WASHINGS  06/22/2023   Procedure: BRONCHIAL WASHINGS;  Surgeon: Leslye Peer, MD;  Location: [facility] ENDOSCOPY;  Service: Pulmonary;;   COLONOSCOPY     CYSTOSCOPY WITH BIOPSY N/A 05/15/2023   Procedure:  CYSTOSCOPY WITH RIGHT RENAL PELVIS BIOPSY;  Surgeon: Rene Paci, MD;  Location: [facility] ORS;  Service: Urology;  Laterality: N/A;   CYSTOSCOPY WITH STENT PLACEMENT Right 05/26/2023   Procedure: CYSTOSCOPY WITH RIGHT STENT PLACEMENT; TUR OF RIGHT URETERAL ORISICE;  Surgeon: Rene Paci, MD;  Location: [facility] ORS;  Service: Urology;  Laterality: Right;   CYSTOSCOPY/RETROGRADE/URETEROSCOPY N/A 05/15/2023   Procedure: CYSTOSCOPY, RIGHT RETROGRADE, RIGHT URETEROSCOPY, RIGHT STENT PLACEMENT;  Surgeon: Rene Paci, MD;  Location: [facility] ORS;  Service: Urology;  Laterality: N/A;  30 MINUTES   HEMOSTASIS CONTROL  08/04/2022   Procedure: HEMOSTASIS CONTROL;  Surgeon: Leslye Peer, MD;  Location: [facility] ENDOSCOPY;  Service: Cardiopulmonary;;   IR IMAGING GUIDED PORT INSERTION  09/04/2022   repair of a torn meniscus Left 05/2018   ROBOT ASSITED LAPAROSCOPIC NEPHROURETERECTOMY Right 05/26/2023   Procedure: XI ROBOT ASSITED LAPAROSCOPIC NEPHROURETERECTOMY;  Surgeon: Rene Paci, MD;  Location: [facility] ORS;  Service: Urology;  Laterality: Right;  210 MINS FOR CASE   VIDEO BRONCHOSCOPY Left 08/04/2022   Procedure: VIDEO BRONCHOSCOPY WITH FLUORO;  Surgeon: Leslye Peer, MD;  Location: [facility] ENDOSCOPY;  Service: Cardiopulmonary;  Laterality: Left;    REVIEW OF SYSTEMS:   Review of Systems  Constitutional: Negative for appetite change, chills, fatigue, fever and unexpected weight change.  HENT:   Negative for mouth sores, nosebleeds, sore throat and trouble swallowing.   Eyes: Negative for eye problems and icterus.  Respiratory: Negative for cough, hemoptysis, shortness of breath and wheezing.   Cardiovascular: Negative for chest pain and leg swelling.  Gastrointestinal: Negative for abdominal pain, constipation, diarrhea, nausea and vomiting.  Genitourinary: Negative for bladder incontinence, difficulty urinating, dysuria, frequency and hematuria.   Musculoskeletal:  Negative for back pain, gait problem, neck pain and neck stiffness.  Skin: Negative for itching and rash.  Neurological: Negative for dizziness, extremity weakness, gait problem, headaches, light-headedness and seizures.  Hematological: Negative for adenopathy. Does not bruise/bleed easily.  Psychiatric/Behavioral: Negative for confusion, depression and sleep disturbance. The patient is not nervous/anxious.     PHYSICAL EXAMINATION:  There were no vitals taken for this visit.  ECOG PERFORMANCE STATUS: {CHL ONC ECOG [id number]  Physical Exam  Constitutional: Oriented to person, place, and time and well-developed, well-nourished, and in no distress. No distress.  HENT:  Head: Normocephalic and atraumatic.  Mouth/Throat: Oropharynx is clear and moist. No oropharyngeal exudate.  Eyes: Conjunctivae are normal. Right eye exhibits no discharge. Left eye exhibits no discharge. No scleral icterus.  Neck: Normal range of motion. Neck supple.  Cardiovascular: Normal rate, regular rhythm, normal heart sounds and intact distal pulses.   Pulmonary/Chest: Effort normal and breath sounds normal. No respiratory distress. No wheezes. No rales.  Abdominal: Soft. Bowel sounds are normal. Exhibits no distension and no mass. There is no tenderness.  Musculoskeletal: Normal range of motion. Exhibits no edema.  Lymphadenopathy:    No cervical adenopathy.  Neurological: Alert and oriented to person, place, and time. Exhibits normal muscle tone. Gait normal. Coordination normal.  Skin: Skin is warm and dry. No rash noted. Not diaphoretic. No erythema. No pallor.  Psychiatric: Mood, memory and judgment normal.  Vitals reviewed.  LABORATORY DATA: Lab Results  Component Value Date   WBC 4.0 07/24/2023   HGB 9.6 (L) 07/24/2023   HCT 28.6 (L) 07/24/2023   MCV 88.5 07/24/2023   PLT 57 (L) 07/24/2023      Chemistry      Component Value Date/Time   NA 136 07/24/2023 1316   NA 141 03/07/2015 0000   K  4.4 07/24/2023 1316   CL 101 07/24/2023 1316   CO2 29 07/24/2023 1316   BUN 26 (H) 07/24/2023 1316   CREATININE 1.19 (H) 07/24/2023 1316   GLU 103 03/07/2015 0000      Component Value Date/Time   CALCIUM 9.5 07/24/2023 1316   ALKPHOS 76 07/24/2023 1316   AST 16 07/24/2023 1316   ALT 17 07/24/2023 1316   BILITOT 0.2 07/24/2023 1316       RADIOGRAPHIC STUDIES:  CT CHEST ABDOMEN PELVIS W CONTRAST Result Date: 07/26/2023 CLINICAL DATA:  History of metastatic uroepithelial cancer and non-small cell lung cancer. Assess treatment response. * Tracking Code: BO * EXAM: CT CHEST, ABDOMEN, AND PELVIS WITH CONTRAST TECHNIQUE: Multidetector CT imaging of the chest, abdomen and pelvis was performed following the standard protocol during bolus administration of intravenous contrast. RADIATION DOSE REDUCTION: This exam was performed according to the departmental dose-optimization program  which includes automated exposure control, adjustment of the mA and/or kV according to patient size and/or use of iterative reconstruction technique. CONTRAST:  75mL OMNIPAQUE IOHEXOL 300 MG/ML  SOLN COMPARISON:  Numerous prior imaging studies. The most recent abdominal/pelvic CT scan is 06/16/2023 and the most recent PET-CT is 06/15/2023. Prior chest CT 05/25/2023 FINDINGS: CT CHEST FINDINGS Cardiovascular: The heart is normal in size. Persistent small pericardial effusion. The aorta is normal in caliber. No dissection. Stable scattered atherosclerotic calcification and stable three-vessel coronary artery calcifications. The pulmonary arteries are grossly normal. Mediastinum/Nodes: No mediastinal or hilar mass or lymphadenopathy. The esophagus is grossly normal. Lungs/Pleura: The pulmonary nodules have decreased slightly in size when compared to the prior chest CT from 05/25/2023. The medial left upper lobe lesion adjacent to the pulmonary arterial trunk measures approximately 10 x 8 mm on image 26/301. This previously measured  28 x 13 mm. Posterior left upper lobe lesion on image 36/302 measures 8 x 5 mm and previously measured 13 x 9 mm. Right lower lobe nodule on image number 69/302 measures a maximum 7 mm and previously measured 8 mm. The hypermetabolic left lower lobe lung lesion medially on the prior PET-CT measures approximately 12 mm transverse dimension and appears unchanged. Numerous stable calcified granulomas. Stable extensive radiation changes involving the left long with significant fibrosis, atelectasis and volume loss in the left hemithorax. Musculoskeletal: No significant bony findings. CT ABDOMEN PELVIS FINDINGS Hepatobiliary: No hepatic lesions or intrahepatic biliary dilatation. The portal and hepatic veins are patent. The gallbladder is unremarkable. No common bile duct dilatation. Pancreas: No mass, inflammation or ductal dilatation. Spleen: Normal in size without focal abnormality. Adrenals/Urinary Tract: The left adrenal gland is normal. Subtle area of persistent hypoperfusion involving the interpolar region of the left kidney posteriorly. This is smaller and has straight margins and is likely an area of resolving focal pyelonephritis. No collecting system abnormalities. The bladder is unremarkable. The right kidney is surgically absent. Stomach/Bowel: Stomach is within normal limits. No evidence of bowel wall thickening, distention, or inflammatory changes. Vascular/Lymphatic: Stable aortic and branch vessel calcifications. No aneurysm or dissection. The right-sided retroperitoneal nodal mass seen on the prior CT scan and PET-CT demonstrates interval improvement. This measures approximately 3.5 x 2.4 cm and previously measured 3.9 x 3.6 cm. The IVC below the renal veins appears involved and occluded. The left-sided hypermetabolic mesenteric lesion previously measured 2.2 x 1.9 cm and now measures 1.4 x 1.1 cm. Reproductive: The uterus and ovaries are unremarkable. Other: No ascites or abdominal wall hernia. No  subcutaneous lesions. Musculoskeletal: The metastatic bone lesions appear stable. Stable lytic destructive lesion involving the T12 spinous process, the right aspect of L3 and the left sacrum. IMPRESSION: 1. Interval decrease in size of the pulmonary nodules, right-sided retroperitoneal nodal mass and left-sided mesenteric mass consistent with a positive response to treatment. 2. Stable metastatic bone lesions.  No new or progressive findings. 3. Stable radiation changes involving the left lung and associated volume loss in the left hemithorax. 4. Stable pericardial effusion. 5. Subtle area of persistent hypoperfusion involving the interpolar region of the left kidney posteriorly. This is smaller and has straight margins and is likely an area of resolving focal pyelonephritis. Electronically Signed   By: Rudie Meyer M.D.   On: 07/26/2023 14:19     ASSESSMENT/PLAN:  This is a very pleasant 73 year old Caucasian female with: 1) initially diagnosed with stage IIIb (T3, N2, M0) non-small cell lung cancer, squamous cell carcinoma.  The patient presented with  an obstructive left lingular mass with consolidation and collapse of the lingula as well as part of the left upper lobe and likely invasion of the mediastinum.  She was diagnosed in February 2024.  2) High Grade Urothelial Carcinoma with predominant squamous features (T4) pending further imaging studies. She presented with a 6.1 x 5.4 cm hypoenhancement of the posterior mid/lower right kidney with associated necrotic retrocaval lymph node. Tumor with predominant squamous differentiation diffusely involves renal pelvis and extend through kidney into perirenal fat and renal sinus fat. Large vein invasion is identified. The renal vein soft tissue margin is positive for carcinoma. Diagnosed in December 2024.   Her molecular studies by [facility] 1 showed no actionable mutations and her PD-L1 expression is 5%.   She completed a course of concurrent  chemoradiation with carboplatin for AUC of 2 and paclitaxel 45 mg/m.  She status post Avan cycles.  Her last dose was given on 10/06/2022 with a partial response.   She underwent 2 cycles of consolidation immunotherapy with Imfinzi 1500 mg IV every 4 weeks. She was found to have pneumonitis and this was discontinued. She completed high dose steroids.    She had CT of the AP performed in November 2024 showing 6.1 x 5.4 cm hypoenhancement of the posterior mid/lower right kidney with associated necrotic retrocaval lymph node, concerning for urothelial neoplasm.    She underwent open radical nephroureterectomy on 05/26/2023 by Dr. Sande Brothers. She is recovering well. However, her surgical specimen shows high grade urothelial carcinoma with squamous differentiation. Tumor with predominant squamous differentiation diffusely involves renal pelvis and extend through kidney into perirenal fat and renal sinus fat. Large vein invasion is identified. The renal vein soft tissue margin is positive for carcinoma.   She is currently on treatment with carboplatin AUC 5 and gemcitabine 1,000 mg/m2. She has had some cytopenias which requires some cancellations in treatment.   The patient was reporting worsening shortness of breath last week. Therefore, her restaging CT scan was ordered for sooner.  Patient was seen with Dr. Arbutus Ped today.  Dr. Arbutus Ped personally and independently reviewed the scan and discussed the results with the patient today.  The scan showed positive response to treatment with ***.   Dr. Arbutus Ped recommends that the patient continue on the same treatment at the same dose.  She will proceed with cycle #3 next week as scheduled.  Dose reduction?  Cruise reimbursement?  Shortness of breath could be secondary to ***they did not see any acute infectious process.  The patient was advised to call immediately if she has any concerning symptoms in the interval. The patient voices understanding of current  disease status and treatment options and is in agreement with the current care plan. All questions were answered. The patient knows to call the clinic with any problems, questions or concerns. We can certainly see the patient much sooner if necessary       No orders of the defined types were placed in this encounter.    I spent {CHL ONC TIME VISIT - [id number]} counseling the patient face to face. The total time spent in the appointment was {CHL ONC TIME VISIT - ZHYQM:[id number]}.  Inell Mimbs L Hellen Shanley, PA-C 07/27/23

## 2023-07-27 NOTE — Telephone Encounter (Signed)
Patient reports wanting to move appt from Wednesday to tomorrow because of the snow coming. Appt made at 11 am with Cassie, PA. Patient sent paperwork for cruise cancellation and reimbursement to my email. Printed paperwork and will have Dr. Arbutus Ped review.

## 2023-07-27 NOTE — Progress Notes (Deleted)
 [facility] OFFICE PROGRESS NOTE  [name], MD [address] [place] [address]  DIAGNOSIS: 1) Stage IIIB (T3, N2, M0) non-small cell lung cancer, squamous cell carcinoma presented with obstructive left lingular mass with consolidation and collapse of the lingula as well as part of the left upper lobe and likely invasion of the mediastinum diagnosed in February 2024.  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   Biomarker Findings Microsatellite status - MS-Stable Tumor Mutational Burden - 5 Muts/Mb Genomic Findings For a complete list of the genes assayed, please refer to the Appendix. CCND1 amplification FGFR1 amplification MDM2 amplification FGF19 amplification FGF3 amplification FGF4 amplification TP53 Q136* 8 Disease relevant genes with no reportable alterations: ALK, BRAF, EGFR, ERBB2, KRAS, MET, RET, ROS1   PDL1: 5%  PRIOR THERAPY: 1) Concurrent chemoradiation with weekly carboplatin for AUC of 2 and paclitaxel 45 Mg/M2. Status post 7 weeks of treatment.  Last dose was given October 06, 2022 with partial response. 2) Consolidation treatment with immunotherapy with Imfinzi 1500 Mg IV every 4 weeks.  First dose on November 13, 2022.  Status post 2 cycles.  This was discontinued secondary to toxicity and suspicious immunotherapy mediated pneumonitis. 3) Right Kidney and Ureter Nephrectomy under the care of Dr. Sande Brothers. Tumor extensively involves renal pelvis, extends through the kidney into the perinephric fat with lymphovascular invasion and positive margin at the peri soft tissue of the renal vein  CURRENT THERAPY: Carboplatin for AUC of 5 on day 1 and gemcitabine 1000 Mg/M2  on days 1 and 8 IV every 3 weeks. First dose on 06/15/2023.  Status post 2 cycles.   INTERVAL HISTORY: Evelyn Jordan 73 y.o. female returns to the clinic today for a follow up visit accompanied by ***. She currently  undergoing systemic chemotherapy for urothelial carcinoma and lung cancer.  She is status post 2 cycles.  The patient is followed closely by palliative care.  She also was seen in the [place] management clinic last week on 07/24/2023 for the chief complaint of shortness of breath.  Therefore the decision was made to arrange for restaging scan after cycle #2 as opposed to cycle #3.  The patient felt that if her treatment was not working, she would rather know sooner rather than later so she can pursue palliative treatment.  Therefore she had a restaging scan and she is here today to review the scan results.  Day 8 of cycle #2 was thrombocytopenia  She denies any abnormal bleeding and bruising.   ***Anemia.   Since last being seen her energy is***.  She denies any fever, chills, or night sweats.  Weight loss?  Appetite?  Shortness of breath?  Cough?  She denies any hemoptysis or chest pain.  Denies any nausea, vomiting, diarrhea, or constipation.  Abdominal pain?  Denies any hematuria, just urea, or malodorous urine.  She is here today for evaluation and to review her scan results.    MEDICAL HISTORY: Past Medical History:  Diagnosis Date   Depression    Fatty liver    Insomnia    Lung cancer (HCC) 07/25/2022   Osteoporosis    Pericardial effusion 03/05/2023   small noted on ECHO   Pulmonary nodule    Thyroid nodule     ALLERGIES:  is allergic to emend [fosaprepitant dimeglumine] and compazine [prochlorperazine edisylate].  MEDICATIONS:  Current Outpatient Medications  Medication Sig Dispense Refill   acetaminophen (TYLENOL) 325 MG tablet  Take 2 tablets (650 mg total) by mouth every 6 (six) hours as needed for mild pain (pain score 1-3) (or Fever >/= 101).     ALPRAZolam (XANAX) 0.5 MG tablet Take 0.5 mg by mouth daily as needed for anxiety or sleep.     bisacodyl (DULCOLAX) 5 MG EC tablet Take 10-15 mg by mouth daily as needed for moderate constipation.     Iron, Ferrous Sulfate, 325 (65  Fe) MG TABS Take 65 mg by mouth daily with breakfast.     levothyroxine (SYNTHROID) 100 MCG tablet Take 1 tablet (100 mcg total) by mouth daily at 6 (six) AM. 30 tablet 2   methylPREDNISolone (MEDROL DOSEPAK) 4 MG TBPK tablet Take 6 pills by mouth day 1, 5 on day 2, 4 on day 3, 3 on day 4, 2 on day 5, 1 on day 6 21 tablet 0   ondansetron (ZOFRAN) 8 MG tablet Take 1 tablet (8 mg total) by mouth every 8 (eight) hours as needed for nausea or vomiting. Start after day 3 of chemotherapy if needed 30 tablet 2   oxyCODONE-acetaminophen (PERCOCET) 5-325 MG tablet Take 1 tablet by mouth every 4 (four) hours as needed. 40 tablet 0   zolpidem (AMBIEN) 5 MG tablet Take 1-2 tablets (5-10 mg total) by mouth at bedtime as needed for sleep. 30 tablet 0   No current facility-administered medications for this visit.    SURGICAL HISTORY:  Past Surgical History:  Procedure Laterality Date   ANKLE SURGERY Right    BREAST BIOPSY Right    benign 2013   BRONCHIAL BIOPSY  08/04/2022   Procedure: BRONCHIAL BIOPSIES;  Surgeon: Leslye Peer, MD;  Location: [facility] ENDOSCOPY;  Service: Cardiopulmonary;;   BRONCHIAL BIOPSY  06/22/2023   Procedure: BRONCHIAL BIOPSIES;  Surgeon: Leslye Peer, MD;  Location: [facility] ENDOSCOPY;  Service: Pulmonary;;   BRONCHIAL BRUSHINGS  08/04/2022   Procedure: BRONCHIAL BRUSHINGS;  Surgeon: Leslye Peer, MD;  Location: [facility] ENDOSCOPY;  Service: Cardiopulmonary;;   BRONCHIAL BRUSHINGS  06/22/2023   Procedure: BRONCHIAL BRUSHINGS;  Surgeon: Leslye Peer, MD;  Location: [facility] ENDOSCOPY;  Service: Pulmonary;;   BRONCHIAL NEEDLE ASPIRATION BIOPSY  06/22/2023   Procedure: BRONCHIAL NEEDLE ASPIRATION BIOPSIES;  Surgeon: Leslye Peer, MD;  Location: [facility] ENDOSCOPY;  Service: Pulmonary;;   BRONCHIAL WASHINGS  06/22/2023   Procedure: BRONCHIAL WASHINGS;  Surgeon: Leslye Peer, MD;  Location: [facility] ENDOSCOPY;  Service: Pulmonary;;   COLONOSCOPY     CYSTOSCOPY WITH BIOPSY N/A 05/15/2023   Procedure:  CYSTOSCOPY WITH RIGHT RENAL PELVIS BIOPSY;  Surgeon: Rene Paci, MD;  Location: [facility] ORS;  Service: Urology;  Laterality: N/A;   CYSTOSCOPY WITH STENT PLACEMENT Right 05/26/2023   Procedure: CYSTOSCOPY WITH RIGHT STENT PLACEMENT; TUR OF RIGHT URETERAL ORISICE;  Surgeon: Rene Paci, MD;  Location: [facility] ORS;  Service: Urology;  Laterality: Right;   CYSTOSCOPY/RETROGRADE/URETEROSCOPY N/A 05/15/2023   Procedure: CYSTOSCOPY, RIGHT RETROGRADE, RIGHT URETEROSCOPY, RIGHT STENT PLACEMENT;  Surgeon: Rene Paci, MD;  Location: [facility] ORS;  Service: Urology;  Laterality: N/A;  30 MINUTES   HEMOSTASIS CONTROL  08/04/2022   Procedure: HEMOSTASIS CONTROL;  Surgeon: Leslye Peer, MD;  Location: [facility] ENDOSCOPY;  Service: Cardiopulmonary;;   IR IMAGING GUIDED PORT INSERTION  09/04/2022   repair of a torn meniscus Left 05/2018   ROBOT ASSITED LAPAROSCOPIC NEPHROURETERECTOMY Right 05/26/2023   Procedure: XI ROBOT ASSITED LAPAROSCOPIC NEPHROURETERECTOMY;  Surgeon: Rene Paci, MD;  Location: [facility] ORS;  Service: Urology;  Laterality: Right;  210 MINS FOR CASE   VIDEO BRONCHOSCOPY Left 08/04/2022   Procedure: VIDEO BRONCHOSCOPY WITH FLUORO;  Surgeon: Leslye Peer, MD;  Location: [facility] ENDOSCOPY;  Service: Cardiopulmonary;  Laterality: Left;    REVIEW OF SYSTEMS:   Review of Systems  Constitutional: Negative for appetite change, chills, fatigue, fever and unexpected weight change.  HENT:   Negative for mouth sores, nosebleeds, sore throat and trouble swallowing.   Eyes: Negative for eye problems and icterus.  Respiratory: Negative for cough, hemoptysis, shortness of breath and wheezing.   Cardiovascular: Negative for chest pain and leg swelling.  Gastrointestinal: Negative for abdominal pain, constipation, diarrhea, nausea and vomiting.  Genitourinary: Negative for bladder incontinence, difficulty urinating, dysuria, frequency and hematuria.   Musculoskeletal:  Negative for back pain, gait problem, neck pain and neck stiffness.  Skin: Negative for itching and rash.  Neurological: Negative for dizziness, extremity weakness, gait problem, headaches, light-headedness and seizures.  Hematological: Negative for adenopathy. Does not bruise/bleed easily.  Psychiatric/Behavioral: Negative for confusion, depression and sleep disturbance. The patient is not nervous/anxious.     PHYSICAL EXAMINATION:  There were no vitals taken for this visit.  ECOG PERFORMANCE STATUS: {CHL ONC ECOG [id number]  Physical Exam  Constitutional: Oriented to person, place, and time and well-developed, well-nourished, and in no distress. No distress.  HENT:  Head: Normocephalic and atraumatic.  Mouth/Throat: Oropharynx is clear and moist. No oropharyngeal exudate.  Eyes: Conjunctivae are normal. Right eye exhibits no discharge. Left eye exhibits no discharge. No scleral icterus.  Neck: Normal range of motion. Neck supple.  Cardiovascular: Normal rate, regular rhythm, normal heart sounds and intact distal pulses.   Pulmonary/Chest: Effort normal and breath sounds normal. No respiratory distress. No wheezes. No rales.  Abdominal: Soft. Bowel sounds are normal. Exhibits no distension and no mass. There is no tenderness.  Musculoskeletal: Normal range of motion. Exhibits no edema.  Lymphadenopathy:    No cervical adenopathy.  Neurological: Alert and oriented to person, place, and time. Exhibits normal muscle tone. Gait normal. Coordination normal.  Skin: Skin is warm and dry. No rash noted. Not diaphoretic. No erythema. No pallor.  Psychiatric: Mood, memory and judgment normal.  Vitals reviewed.  LABORATORY DATA: Lab Results  Component Value Date   WBC 4.0 07/24/2023   HGB 9.6 (L) 07/24/2023   HCT 28.6 (L) 07/24/2023   MCV 88.5 07/24/2023   PLT 57 (L) 07/24/2023      Chemistry      Component Value Date/Time   NA 136 07/24/2023 1316   NA 141 03/07/2015 0000   K  4.4 07/24/2023 1316   CL 101 07/24/2023 1316   CO2 29 07/24/2023 1316   BUN 26 (H) 07/24/2023 1316   CREATININE 1.19 (H) 07/24/2023 1316   GLU 103 03/07/2015 0000      Component Value Date/Time   CALCIUM 9.5 07/24/2023 1316   ALKPHOS 76 07/24/2023 1316   AST 16 07/24/2023 1316   ALT 17 07/24/2023 1316   BILITOT 0.2 07/24/2023 1316       RADIOGRAPHIC STUDIES:  CT CHEST ABDOMEN PELVIS W CONTRAST Result Date: 07/26/2023 CLINICAL DATA:  History of metastatic uroepithelial cancer and non-small cell lung cancer. Assess treatment response. * Tracking Code: BO * EXAM: CT CHEST, ABDOMEN, AND PELVIS WITH CONTRAST TECHNIQUE: Multidetector CT imaging of the chest, abdomen and pelvis was performed following the standard protocol during bolus administration of intravenous contrast. RADIATION DOSE REDUCTION: This exam was performed according to the departmental dose-optimization program  which includes automated exposure control, adjustment of the mA and/or kV according to patient size and/or use of iterative reconstruction technique. CONTRAST:  75mL OMNIPAQUE IOHEXOL 300 MG/ML  SOLN COMPARISON:  Numerous prior imaging studies. The most recent abdominal/pelvic CT scan is 06/16/2023 and the most recent PET-CT is 06/15/2023. Prior chest CT 05/25/2023 FINDINGS: CT CHEST FINDINGS Cardiovascular: The heart is normal in size. Persistent small pericardial effusion. The aorta is normal in caliber. No dissection. Stable scattered atherosclerotic calcification and stable three-vessel coronary artery calcifications. The pulmonary arteries are grossly normal. Mediastinum/Nodes: No mediastinal or hilar mass or lymphadenopathy. The esophagus is grossly normal. Lungs/Pleura: The pulmonary nodules have decreased slightly in size when compared to the prior chest CT from 05/25/2023. The medial left upper lobe lesion adjacent to the pulmonary arterial trunk measures approximately 10 x 8 mm on image 26/301. This previously measured  28 x 13 mm. Posterior left upper lobe lesion on image 36/302 measures 8 x 5 mm and previously measured 13 x 9 mm. Right lower lobe nodule on image number 69/302 measures a maximum 7 mm and previously measured 8 mm. The hypermetabolic left lower lobe lung lesion medially on the prior PET-CT measures approximately 12 mm transverse dimension and appears unchanged. Numerous stable calcified granulomas. Stable extensive radiation changes involving the left long with significant fibrosis, atelectasis and volume loss in the left hemithorax. Musculoskeletal: No significant bony findings. CT ABDOMEN PELVIS FINDINGS Hepatobiliary: No hepatic lesions or intrahepatic biliary dilatation. The portal and hepatic veins are patent. The gallbladder is unremarkable. No common bile duct dilatation. Pancreas: No mass, inflammation or ductal dilatation. Spleen: Normal in size without focal abnormality. Adrenals/Urinary Tract: The left adrenal gland is normal. Subtle area of persistent hypoperfusion involving the interpolar region of the left kidney posteriorly. This is smaller and has straight margins and is likely an area of resolving focal pyelonephritis. No collecting system abnormalities. The bladder is unremarkable. The right kidney is surgically absent. Stomach/Bowel: Stomach is within normal limits. No evidence of bowel wall thickening, distention, or inflammatory changes. Vascular/Lymphatic: Stable aortic and branch vessel calcifications. No aneurysm or dissection. The right-sided retroperitoneal nodal mass seen on the prior CT scan and PET-CT demonstrates interval improvement. This measures approximately 3.5 x 2.4 cm and previously measured 3.9 x 3.6 cm. The IVC below the renal veins appears involved and occluded. The left-sided hypermetabolic mesenteric lesion previously measured 2.2 x 1.9 cm and now measures 1.4 x 1.1 cm. Reproductive: The uterus and ovaries are unremarkable. Other: No ascites or abdominal wall hernia. No  subcutaneous lesions. Musculoskeletal: The metastatic bone lesions appear stable. Stable lytic destructive lesion involving the T12 spinous process, the right aspect of L3 and the left sacrum. IMPRESSION: 1. Interval decrease in size of the pulmonary nodules, right-sided retroperitoneal nodal mass and left-sided mesenteric mass consistent with a positive response to treatment. 2. Stable metastatic bone lesions.  No new or progressive findings. 3. Stable radiation changes involving the left lung and associated volume loss in the left hemithorax. 4. Stable pericardial effusion. 5. Subtle area of persistent hypoperfusion involving the interpolar region of the left kidney posteriorly. This is smaller and has straight margins and is likely an area of resolving focal pyelonephritis. Electronically Signed   By: Rudie Meyer M.D.   On: 07/26/2023 14:19     ASSESSMENT/PLAN:  This is a very pleasant 73 year old Caucasian female with: 1) initially diagnosed with stage IIIb (T3, N2, M0) non-small cell lung cancer, squamous cell carcinoma.  The patient presented with  an obstructive left lingular mass with consolidation and collapse of the lingula as well as part of the left upper lobe and likely invasion of the mediastinum.  She was diagnosed in February 2024.  2) High Grade Urothelial Carcinoma with predominant squamous features (T4) pending further imaging studies. She presented with a 6.1 x 5.4 cm hypoenhancement of the posterior mid/lower right kidney with associated necrotic retrocaval lymph node. Tumor with predominant squamous differentiation diffusely involves renal pelvis and extend through kidney into perirenal fat and renal sinus fat. Large vein invasion is identified. The renal vein soft tissue margin is positive for carcinoma. Diagnosed in December 2024.   Her molecular studies by [facility] 1 showed no actionable mutations and her PD-L1 expression is 5%.   She completed a course of concurrent  chemoradiation with carboplatin for AUC of 2 and paclitaxel 45 mg/m.  She status post Avan cycles.  Her last dose was given on 10/06/2022 with a partial response.   She underwent 2 cycles of consolidation immunotherapy with Imfinzi 1500 mg IV every 4 weeks. She was found to have pneumonitis and this was discontinued. She completed high dose steroids.    She had CT of the AP performed in November 2024 showing 6.1 x 5.4 cm hypoenhancement of the posterior mid/lower right kidney with associated necrotic retrocaval lymph node, concerning for urothelial neoplasm.    She underwent open radical nephroureterectomy on 05/26/2023 by Dr. Sande Brothers. She is recovering well. However, her surgical specimen shows high grade urothelial carcinoma with squamous differentiation. Tumor with predominant squamous differentiation diffusely involves renal pelvis and extend through kidney into perirenal fat and renal sinus fat. Large vein invasion is identified. The renal vein soft tissue margin is positive for carcinoma.   She is currently on treatment with carboplatin AUC 5 and gemcitabine 1,000 mg/m2       No orders of the defined types were placed in this encounter.    I spent {CHL ONC TIME VISIT - [id number]} counseling the patient face to face. The total time spent in the appointment was {CHL ONC TIME VISIT - OZHYQ:[id number]}.  Evelyn Jordan L Mathilde Mcwherter, PA-C 07/27/23

## 2023-07-28 ENCOUNTER — Inpatient Hospital Stay (HOSPITAL_BASED_OUTPATIENT_CLINIC_OR_DEPARTMENT_OTHER): Payer: Medicare Other | Admitting: Physician Assistant

## 2023-07-28 VITALS — BP 123/85 | HR 100 | Temp 98.2°F | Resp 16 | Wt 114.3 lb

## 2023-07-28 DIAGNOSIS — C7951 Secondary malignant neoplasm of bone: Secondary | ICD-10-CM | POA: Diagnosis not present

## 2023-07-28 DIAGNOSIS — T451X5A Adverse effect of antineoplastic and immunosuppressive drugs, initial encounter: Secondary | ICD-10-CM

## 2023-07-28 DIAGNOSIS — D6481 Anemia due to antineoplastic chemotherapy: Secondary | ICD-10-CM | POA: Diagnosis not present

## 2023-07-28 DIAGNOSIS — Z510 Encounter for antineoplastic radiation therapy: Secondary | ICD-10-CM | POA: Diagnosis not present

## 2023-07-28 DIAGNOSIS — Z5111 Encounter for antineoplastic chemotherapy: Secondary | ICD-10-CM | POA: Diagnosis not present

## 2023-07-28 DIAGNOSIS — C3412 Malignant neoplasm of upper lobe, left bronchus or lung: Secondary | ICD-10-CM | POA: Diagnosis not present

## 2023-07-28 DIAGNOSIS — G893 Neoplasm related pain (acute) (chronic): Secondary | ICD-10-CM | POA: Diagnosis not present

## 2023-07-28 DIAGNOSIS — C679 Malignant neoplasm of bladder, unspecified: Secondary | ICD-10-CM | POA: Diagnosis not present

## 2023-07-28 DIAGNOSIS — C3490 Malignant neoplasm of unspecified part of unspecified bronchus or lung: Secondary | ICD-10-CM

## 2023-07-28 MED ORDER — OXYCODONE-ACETAMINOPHEN 5-325 MG PO TABS: 1.0000 | ORAL_TABLET | Freq: Four times a day (QID) | ORAL | 0 refills | Status: DC | PRN

## 2023-07-29 ENCOUNTER — Ambulatory Visit: Payer: Medicare Other | Admitting: Physician Assistant

## 2023-08-03 ENCOUNTER — Inpatient Hospital Stay (HOSPITAL_BASED_OUTPATIENT_CLINIC_OR_DEPARTMENT_OTHER): Payer: Medicare Other | Admitting: Internal Medicine

## 2023-08-03 ENCOUNTER — Ambulatory Visit: Payer: Medicare Other | Admitting: Physician Assistant

## 2023-08-03 ENCOUNTER — Inpatient Hospital Stay: Payer: Medicare Other

## 2023-08-03 ENCOUNTER — Other Ambulatory Visit: Payer: Medicare Other

## 2023-08-03 ENCOUNTER — Ambulatory Visit: Payer: Medicare Other

## 2023-08-03 VITALS — BP 94/68 | HR 100 | Temp 98.2°F | Resp 16 | Ht 63.0 in | Wt 117.3 lb

## 2023-08-03 DIAGNOSIS — Z95828 Presence of other vascular implants and grafts: Secondary | ICD-10-CM

## 2023-08-03 DIAGNOSIS — D6481 Anemia due to antineoplastic chemotherapy: Secondary | ICD-10-CM | POA: Diagnosis not present

## 2023-08-03 DIAGNOSIS — C7800 Secondary malignant neoplasm of unspecified lung: Secondary | ICD-10-CM

## 2023-08-03 DIAGNOSIS — C7951 Secondary malignant neoplasm of bone: Secondary | ICD-10-CM | POA: Diagnosis not present

## 2023-08-03 DIAGNOSIS — C3412 Malignant neoplasm of upper lobe, left bronchus or lung: Secondary | ICD-10-CM | POA: Diagnosis not present

## 2023-08-03 DIAGNOSIS — C679 Malignant neoplasm of bladder, unspecified: Secondary | ICD-10-CM

## 2023-08-03 DIAGNOSIS — Z510 Encounter for antineoplastic radiation therapy: Secondary | ICD-10-CM | POA: Diagnosis not present

## 2023-08-03 DIAGNOSIS — Z5111 Encounter for antineoplastic chemotherapy: Secondary | ICD-10-CM | POA: Diagnosis not present

## 2023-08-03 DIAGNOSIS — T451X5A Adverse effect of antineoplastic and immunosuppressive drugs, initial encounter: Secondary | ICD-10-CM

## 2023-08-03 LAB — CMP (CANCER CENTER ONLY)
ALT: 20 U/L (ref 0–44)
AST: 19 U/L (ref 15–41)
Albumin: 3.4 g/dL — ABNORMAL LOW (ref 3.5–5.0)
Alkaline Phosphatase: 63 U/L (ref 38–126)
Anion gap: 5 (ref 5–15)
BUN: 30 mg/dL — ABNORMAL HIGH (ref 8–23)
CO2: 27 mmol/L (ref 22–32)
Calcium: 9 mg/dL (ref 8.9–10.3)
Chloride: 103 mmol/L (ref 98–111)
Creatinine: 1.29 mg/dL — ABNORMAL HIGH (ref 0.44–1.00)
GFR, Estimated: 44 mL/min — ABNORMAL LOW (ref >60)
Glucose, Bld: 136 mg/dL — ABNORMAL HIGH (ref 70–99)
Potassium: 4 mmol/L (ref 3.5–5.1)
Sodium: 135 mmol/L (ref 135–145)
Total Bilirubin: 0.2 mg/dL (ref 0.0–1.2)
Total Protein: 6.5 g/dL (ref 6.5–8.1)

## 2023-08-03 LAB — SAMPLE TO BLOOD BANK

## 2023-08-03 LAB — CBC WITH DIFFERENTIAL (CANCER CENTER ONLY)
Abs Immature Granulocytes: 0.03 10*3/uL (ref 0.00–0.07)
Basophils Absolute: 0.1 10*3/uL (ref 0.0–0.1)
Basophils Relative: 1 %
Eosinophils Absolute: 0.1 10*3/uL (ref 0.0–0.5)
Eosinophils Relative: 2 %
HCT: 27.4 % — ABNORMAL LOW (ref 36.0–46.0)
Hemoglobin: 8.9 g/dL — ABNORMAL LOW (ref 12.0–15.0)
Immature Granulocytes: 1 %
Lymphocytes Relative: 3 %
Lymphs Abs: 0.2 10*3/uL — ABNORMAL LOW (ref 0.7–4.0)
MCH: 29.8 pg (ref 26.0–34.0)
MCHC: 32.5 g/dL (ref 30.0–36.0)
MCV: 91.6 fL (ref 80.0–100.0)
Monocytes Absolute: 1.1 10*3/uL — ABNORMAL HIGH (ref 0.1–1.0)
Monocytes Relative: 24 %
Neutro Abs: 3.1 10*3/uL (ref 1.7–7.7)
Neutrophils Relative %: 69 %
Platelet Count: 158 10*3/uL (ref 150–400)
RBC: 2.99 MIL/uL — ABNORMAL LOW (ref 3.87–5.11)
RDW: 18.3 % — ABNORMAL HIGH (ref 11.5–15.5)
WBC Count: 4.5 10*3/uL (ref 4.0–10.5)
nRBC: 0 % (ref 0.0–0.2)

## 2023-08-03 MED ORDER — SODIUM CHLORIDE 0.9% FLUSH
10.0000 mL | Freq: Once | INTRAVENOUS | Status: AC
  Administered 2023-08-03: 10 mL

## 2023-08-03 MED ORDER — SODIUM CHLORIDE 0.9 % IV SOLN
242.4000 mg | Freq: Once | INTRAVENOUS | Status: AC
Start: 2023-08-03 — End: 2023-08-03
  Administered 2023-08-03: 240 mg via INTRAVENOUS
  Filled 2023-08-03: qty 24

## 2023-08-03 MED ORDER — DEXAMETHASONE SODIUM PHOSPHATE 10 MG/ML IJ SOLN
10.0000 mg | Freq: Once | INTRAMUSCULAR | Status: AC
  Administered 2023-08-03: 10 mg via INTRAVENOUS
  Filled 2023-08-03: qty 1

## 2023-08-03 MED ORDER — HEPARIN SOD (PORK) LOCK FLUSH 100 UNIT/ML IV SOLN
500.0000 [IU] | Freq: Once | INTRAVENOUS | Status: AC | PRN
  Administered 2023-08-03: 500 [IU]

## 2023-08-03 MED ORDER — SODIUM CHLORIDE 0.9 % IV SOLN: INTRAVENOUS | Status: DC

## 2023-08-03 MED ORDER — SODIUM CHLORIDE 0.9% FLUSH
10.0000 mL | INTRAVENOUS | Status: DC | PRN
Start: 2023-08-03 — End: 2023-08-03
  Administered 2023-08-03: 10 mL

## 2023-08-03 MED ORDER — SODIUM CHLORIDE 0.9 % IV SOLN
800.0000 mg/m2 | Freq: Once | INTRAVENOUS | Status: AC
  Administered 2023-08-03: 1217 mg via INTRAVENOUS
  Filled 2023-08-03: qty 32.01

## 2023-08-03 MED ORDER — PALONOSETRON HCL INJECTION 0.25 MG/5ML
0.2500 mg | Freq: Once | INTRAVENOUS | Status: AC
  Administered 2023-08-03: 0.25 mg via INTRAVENOUS
  Filled 2023-08-03: qty 5

## 2023-08-03 NOTE — Progress Notes (Signed)
 [facility] Telephone:([phone]) [phone]   [phone]  OFFICE PROGRESS NOTE  [name], MD [address] [place] [address]  DIAGNOSIS:  1) Stage IIIB (T3, N2, M0) non-small cell lung cancer, squamous cell carcinoma presented with obstructive left lingular mass with consolidation and collapse of the lingula as well as part of the left upper lobe and likely invasion of the mediastinum diagnosed in February 2024.  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  Biomarker Findings Microsatellite status - MS-Stable Tumor Mutational Burden - 5 Muts/Mb Genomic Findings For a complete list of the genes assayed, please refer to the Appendix. CCND1 amplification FGFR1 amplification MDM2 amplification FGF19 amplification FGF3 amplification FGF4 amplification TP53 Q136* 8 Disease relevant genes with no reportable alterations: ALK, BRAF, EGFR, ERBB2, KRAS, MET, RET, ROS1  PDL1: 5%   PRIOR THERAPY:  1) Concurrent chemoradiation with weekly carboplatin for AUC of 2 and paclitaxel 45 Mg/M2. Status post 7 weeks of treatment.  Last dose was given October 06, 2022 with partial response. 2) Consolidation treatment with immunotherapy with Imfinzi 1500 Mg IV every 4 weeks.  First dose on November 13, 2022.  Status post 2 cycles.  This was discontinued secondary to toxicity and suspicious immunotherapy mediated pneumonitis. 3) Right Kidney and Ureter Nephrectomy under the care of Dr. Sande Brothers. Tumor extensively involves renal pelvis, extends through the kidney into the perinephric fat with lymphovascular invasion and positive margin at the peri soft tissue of the renal vein   CURRENT THERAPY: Carboplatin for AUC of 5 on day 1 and gemcitabine 1000 Mg/M2  on days 1 and 8 IV every 3 weeks. First dose on 06/15/2023.  Status post 1 cycle.  INTERVAL HISTORY: Evelyn Jordan 73 y.o. female returns to the clinic today for  follow-up visit accompanied by her daughter.Discussed the use of AI scribe software for clinical note transcription with the patient, who gave verbal consent to proceed.  History of Present Illness   Evelyn Jordan is a 73 year old female with metastatic urothelial carcinoma who presents for chemotherapy treatment.  She has a history of metastatic urothelial carcinoma diagnosed in December 2024 and is currently undergoing chemotherapy with carboplatin and gemcitabine. She has completed three cycles of this treatment regimen. Previously, she was diagnosed with squamous cell carcinoma in February 2024, for which she underwent chemotherapy and radiation. Additionally, she was treated with 2 cycles of Imfinzi discontinued secondary to pneumonitis.  She experiences fatigue and weakness, which she attributes to anemia caused by chemotherapy and her kidney condition. Her hemoglobin level is low at approximately 8.9, but she has not required a transfusion. She is currently taking iron supplements to manage her anemia.  No nausea or vomiting.        MEDICAL HISTORY: Past Medical History:  Diagnosis Date   Depression    Fatty liver    Insomnia    Lung cancer (HCC) 07/25/2022   Osteoporosis    Pericardial effusion 03/05/2023   small noted on ECHO   Pulmonary nodule    Thyroid nodule     ALLERGIES:  is allergic to emend [fosaprepitant dimeglumine] and compazine [prochlorperazine edisylate].  MEDICATIONS:  Current Outpatient Medications  Medication Sig Dispense Refill   acetaminophen (TYLENOL) 325 MG tablet Take 2 tablets (650 mg total) by mouth every 6 (six) hours as needed for mild pain (pain score 1-3) (or Fever >/= 101).     ALPRAZolam Prudy Feeler)  0.5 MG tablet Take 0.5 mg by mouth daily as needed for anxiety or sleep.     bisacodyl (DULCOLAX) 5 MG EC tablet Take 10-15 mg by mouth daily as needed for moderate constipation.     Iron, Ferrous Sulfate, 325 (65 Fe) MG TABS Take 65 mg by mouth  daily with breakfast.     levothyroxine (SYNTHROID) 100 MCG tablet Take 1 tablet (100 mcg total) by mouth daily at 6 (six) AM. 30 tablet 2   methylPREDNISolone (MEDROL DOSEPAK) 4 MG TBPK tablet Take 6 pills by mouth day 1, 5 on day 2, 4 on day 3, 3 on day 4, 2 on day 5, 1 on day 6 21 tablet 0   ondansetron (ZOFRAN) 8 MG tablet Take 1 tablet (8 mg total) by mouth every 8 (eight) hours as needed for nausea or vomiting. Start after day 3 of chemotherapy if needed 30 tablet 2   oxyCODONE-acetaminophen (PERCOCET/ROXICET) 5-325 MG tablet Take 1 tablet by mouth every 6 (six) hours as needed for severe pain (pain score 7-10). 40 tablet 0   zolpidem (AMBIEN) 5 MG tablet Take 1-2 tablets (5-10 mg total) by mouth at bedtime as needed for sleep. 30 tablet 0   No current facility-administered medications for this visit.    SURGICAL HISTORY:  Past Surgical History:  Procedure Laterality Date   ANKLE SURGERY Right    BREAST BIOPSY Right    benign 2013   BRONCHIAL BIOPSY  08/04/2022   Procedure: BRONCHIAL BIOPSIES;  Surgeon: Leslye Peer, MD;  [facility];  Service: Cardiopulmonary;;   BRONCHIAL BIOPSY  06/22/2023   Procedure: BRONCHIAL BIOPSIES;  Surgeon: Leslye Peer, MD;  Location: [facility] ENDOSCOPY;  Service: Pulmonary;;   BRONCHIAL BRUSHINGS  08/04/2022   Procedure: BRONCHIAL BRUSHINGS;  Surgeon: Leslye Peer, MD;  Location: [facility] ENDOSCOPY;  Service: Cardiopulmonary;;   BRONCHIAL BRUSHINGS  06/22/2023   Procedure: BRONCHIAL BRUSHINGS;  Surgeon: Leslye Peer, MD;  Location: [facility] ENDOSCOPY;  Service: Pulmonary;;   BRONCHIAL NEEDLE ASPIRATION BIOPSY  06/22/2023   Procedure: BRONCHIAL NEEDLE ASPIRATION BIOPSIES;  Surgeon: Leslye Peer, MD;  Location: [facility] ENDOSCOPY;  Service: Pulmonary;;   BRONCHIAL WASHINGS  06/22/2023   Procedure: BRONCHIAL WASHINGS;  Surgeon: Leslye Peer, MD;  Location: [facility] ENDOSCOPY;  Service: Pulmonary;;   COLONOSCOPY     CYSTOSCOPY WITH BIOPSY N/A 05/15/2023    Procedure: CYSTOSCOPY WITH RIGHT RENAL PELVIS BIOPSY;  Surgeon: Rene Paci, MD;  Location: [facility] ORS;  Service: Urology;  Laterality: N/A;   CYSTOSCOPY WITH STENT PLACEMENT Right 05/26/2023   Procedure: CYSTOSCOPY WITH RIGHT STENT PLACEMENT; TUR OF RIGHT URETERAL ORISICE;  Surgeon: Rene Paci, MD;  Location: [facility] ORS;  Service: Urology;  Laterality: Right;   CYSTOSCOPY/RETROGRADE/URETEROSCOPY N/A 05/15/2023   Procedure: CYSTOSCOPY, RIGHT RETROGRADE, RIGHT URETEROSCOPY, RIGHT STENT PLACEMENT;  Surgeon: Rene Paci, MD;  Location: [facility] ORS;  Service: Urology;  Laterality: N/A;  30 MINUTES   HEMOSTASIS CONTROL  08/04/2022   Procedure: HEMOSTASIS CONTROL;  Surgeon: Leslye Peer, MD;  Location: [facility] ENDOSCOPY;  Service: Cardiopulmonary;;   IR IMAGING GUIDED PORT INSERTION  09/04/2022   repair of a torn meniscus Left 05/2018   ROBOT ASSITED LAPAROSCOPIC NEPHROURETERECTOMY Right 05/26/2023   Procedure: XI ROBOT ASSITED LAPAROSCOPIC NEPHROURETERECTOMY;  Surgeon: Rene Paci, MD;  Location: [facility] ORS;  Service: Urology;  Laterality: Right;  210 MINS FOR CASE   VIDEO BRONCHOSCOPY Left 08/04/2022   Procedure: VIDEO BRONCHOSCOPY WITH FLUORO;  Surgeon: Levy Pupa  S, MD;  Location: [facility] ENDOSCOPY;  Service: Cardiopulmonary;  Laterality: Left;    REVIEW OF SYSTEMS:  Constitutional: positive for fatigue Eyes: negative Ears, nose, mouth, throat, and face: negative Respiratory: negative Cardiovascular: negative Gastrointestinal: negative Genitourinary:negative Integument/breast: negative Hematologic/lymphatic: negative Musculoskeletal:negative Neurological: negative Behavioral/Psych: negative Endocrine: negative Allergic/Immunologic: negative   PHYSICAL EXAMINATION: General appearance: alert, cooperative, fatigued, and no distress Head: Normocephalic, without obvious abnormality, atraumatic Neck: no adenopathy, no JVD, supple, symmetrical, trachea  midline, and thyroid not enlarged, symmetric, no tenderness/mass/nodules Lymph nodes: Cervical, supraclavicular, and axillary nodes normal. Resp: clear to auscultation bilaterally Back: symmetric, no curvature. ROM normal. No CVA tenderness. Cardio: regular rate and rhythm, S1, S2 normal, no murmur, click, rub or gallop GI: soft, non-tender; bowel sounds normal; no masses,  no organomegaly Extremities: extremities normal, atraumatic, no cyanosis or edema Neurologic: Alert and oriented X 3, normal strength and tone. Normal symmetric reflexes. Normal coordination and gait  ECOG PERFORMANCE STATUS: 1 - Symptomatic but completely ambulatory  Blood pressure 94/68, pulse 100, temperature 98.2 F (36.8 C), temperature source Temporal, resp. rate 16, height 5\' 3"  (1.6 m), weight 117 lb 4.8 oz (53.2 kg), SpO2 99%.  LABORATORY DATA: Lab Results  Component Value Date   WBC 4.5 08/03/2023   HGB 8.9 (L) 08/03/2023   HCT 27.4 (L) 08/03/2023   MCV 91.6 08/03/2023   PLT 158 08/03/2023      Chemistry      Component Value Date/Time   NA 136 07/24/2023 1316   NA 141 03/07/2015 0000   K 4.4 07/24/2023 1316   CL 101 07/24/2023 1316   CO2 29 07/24/2023 1316   BUN 26 (H) 07/24/2023 1316   CREATININE 1.19 (H) 07/24/2023 1316   GLU 103 03/07/2015 0000      Component Value Date/Time   CALCIUM 9.5 07/24/2023 1316   ALKPHOS 76 07/24/2023 1316   AST 16 07/24/2023 1316   ALT 17 07/24/2023 1316   BILITOT 0.2 07/24/2023 1316       RADIOGRAPHIC STUDIES: CT CHEST ABDOMEN PELVIS W CONTRAST Result Date: 07/26/2023 CLINICAL DATA:  History of metastatic uroepithelial cancer and non-small cell lung cancer. Assess treatment response. * Tracking Code: BO * EXAM: CT CHEST, ABDOMEN, AND PELVIS WITH CONTRAST TECHNIQUE: Multidetector CT imaging of the chest, abdomen and pelvis was performed following the standard protocol during bolus administration of intravenous contrast. RADIATION DOSE REDUCTION: This exam was  performed according to the departmental dose-optimization program which includes automated exposure control, adjustment of the mA and/or kV according to patient size and/or use of iterative reconstruction technique. CONTRAST:  75mL OMNIPAQUE IOHEXOL 300 MG/ML  SOLN COMPARISON:  Numerous prior imaging studies. The most recent abdominal/pelvic CT scan is 06/16/2023 and the most recent PET-CT is 06/15/2023. Prior chest CT 05/25/2023 FINDINGS: CT CHEST FINDINGS Cardiovascular: The heart is normal in size. Persistent small pericardial effusion. The aorta is normal in caliber. No dissection. Stable scattered atherosclerotic calcification and stable three-vessel coronary artery calcifications. The pulmonary arteries are grossly normal. Mediastinum/Nodes: No mediastinal or hilar mass or lymphadenopathy. The esophagus is grossly normal. Lungs/Pleura: The pulmonary nodules have decreased slightly in size when compared to the prior chest CT from 05/25/2023. The medial left upper lobe lesion adjacent to the pulmonary arterial trunk measures approximately 10 x 8 mm on image 26/301. This previously measured 28 x 13 mm. Posterior left upper lobe lesion on image 36/302 measures 8 x 5 mm and previously measured 13 x 9 mm. Right lower lobe nodule on image number  69/302 measures a maximum 7 mm and previously measured 8 mm. The hypermetabolic left lower lobe lung lesion medially on the prior PET-CT measures approximately 12 mm transverse dimension and appears unchanged. Numerous stable calcified granulomas. Stable extensive radiation changes involving the left long with significant fibrosis, atelectasis and volume loss in the left hemithorax. Musculoskeletal: No significant bony findings. CT ABDOMEN PELVIS FINDINGS Hepatobiliary: No hepatic lesions or intrahepatic biliary dilatation. The portal and hepatic veins are patent. The gallbladder is unremarkable. No common bile duct dilatation. Pancreas: No mass, inflammation or ductal  dilatation. Spleen: Normal in size without focal abnormality. Adrenals/Urinary Tract: The left adrenal gland is normal. Subtle area of persistent hypoperfusion involving the interpolar region of the left kidney posteriorly. This is smaller and has straight margins and is likely an area of resolving focal pyelonephritis. No collecting system abnormalities. The bladder is unremarkable. The right kidney is surgically absent. Stomach/Bowel: Stomach is within normal limits. No evidence of bowel wall thickening, distention, or inflammatory changes. Vascular/Lymphatic: Stable aortic and branch vessel calcifications. No aneurysm or dissection. The right-sided retroperitoneal nodal mass seen on the prior CT scan and PET-CT demonstrates interval improvement. This measures approximately 3.5 x 2.4 cm and previously measured 3.9 x 3.6 cm. The IVC below the renal veins appears involved and occluded. The left-sided hypermetabolic mesenteric lesion previously measured 2.2 x 1.9 cm and now measures 1.4 x 1.1 cm. Reproductive: The uterus and ovaries are unremarkable. Other: No ascites or abdominal wall hernia. No subcutaneous lesions. Musculoskeletal: The metastatic bone lesions appear stable. Stable lytic destructive lesion involving the T12 spinous process, the right aspect of L3 and the left sacrum. IMPRESSION: 1. Interval decrease in size of the pulmonary nodules, right-sided retroperitoneal nodal mass and left-sided mesenteric mass consistent with a positive response to treatment. 2. Stable metastatic bone lesions.  No new or progressive findings. 3. Stable radiation changes involving the left lung and associated volume loss in the left hemithorax. 4. Stable pericardial effusion. 5. Subtle area of persistent hypoperfusion involving the interpolar region of the left kidney posteriorly. This is smaller and has straight margins and is likely an area of resolving focal pyelonephritis. Electronically Signed   By: Rudie Meyer M.D.    On: 07/26/2023 14:19    ASSESSMENT AND PLAN: This is a very pleasant 73 years old white female with  1) Stage IIIB (T3, N2, M0) non-small cell lung cancer, squamous cell carcinoma presented with obstructive left lingular mass with consolidation and collapse of the lingula as well as part of the left upper lobe and likely invasion of the mediastinum diagnosed in February 2024.  Molecular studies by foundation 1 showed no actionable mutations PDL1: 5% The patient underwent a course of concurrent chemoradiation with weekly carboplatin for AUC of 2 and paclitaxel 45 Mg/M2 status post 7  cycles.  Last dose was given on October 06, 2022 with partial response. The patient tolerated the previous course of her treatment fairly well with no concerning adverse effect except for the fatigue and the radiation-induced esophagitis. She had partial response to this treatment. She underwent consolidation treatment with immunotherapy with Imfinzi 1500 Mg IV every 4 weeks status post 2 cycle.  Her treatment was discontinued secondary to toxicity.  The patient has been on observation 2) metastatic urothelial carcinoma with squamous cell differentiation status post right ureteral nephrectomy in December 2024.  She is currently on systemic chemotherapy with carboplatin for AUC of 5 on day 1 and gemcitabine 1000 Mg/M2 on days 1 and 8  every 3 weeks.  She is status post 2 cycles.  She has been tolerating this treatment well except for the fatigue.    Metastatic Urothelial Carcinoma Undergoing chemotherapy with carboplatin and gemcitabine for metastatic urothelial carcinoma diagnosed in December 2024. Completed one and a half cycles. Current labs show hemoglobin at 8.9, indicating anemia likely due to chemotherapy and underlying kidney issues. Reports fatigue and weakness. No nausea or vomiting. Discussed informed consent, including risks of fatigue, anemia, and potential transfusions. Benefits include tumor reduction and symptom  management. Alternatives include different chemotherapy regimens or palliative care. Moderate response rate with current regimen. - Continue chemotherapy with carboplatin and gemcitabine - Monitor hemoglobin levels - Administer transfusion if hemoglobin drops further - Follow up in three weeks or sooner if concerning symptoms arise  Anemia Anemia secondary to chemotherapy and kidney issues. Hemoglobin is 9. Experiencing fatigue and weakness. Discussed informed consent, including risks of fatigue and potential transfusions. Benefits include improved energy levels with iron supplementation and transfusions as needed. Alternatives include erythropoiesis-stimulating agents. - Continue iron supplements - Monitor hemoglobin levels - Administer transfusion as needed  Squamous Cell Carcinoma Treated with chemotherapy and radiation in February 2024. No current issues related to this diagnosis.  General Health Maintenance No additional general health maintenance issues discussed.  Follow-up - Follow up in three weeks - Return sooner if concerning symptoms arise.   The patient was advised to call immediately if she has any other concerning symptoms in the interval. The patient voices understanding of current disease status and treatment options and is in agreement with the current care plan.  All questions were answered. The patient knows to call the clinic with any problems, questions or concerns. We can certainly see the patient much sooner if necessary.  The total time spent in the appointment was 30 minutes.  Disclaimer: This note was dictated with voice recognition software. Similar sounding words can inadvertently be transcribed and may not be corrected upon review.

## 2023-08-03 NOTE — Patient Instructions (Signed)
 [facility] - A DEPT OF [facility]  Discharge Instructions: Thank you for choosing [facility] to provide your oncology and hematology care.   If you have a lab appointment with the Cancer Center, please go directly to the Cancer Center and check in at the registration area.   Wear comfortable clothing and clothing appropriate for easy access to any Portacath or PICC line.   We strive to give you quality time with your provider. You may need to reschedule your appointment if you arrive late (15 or more minutes).  Arriving late affects you and other patients whose appointments are after yours.  Also, if you miss three or more appointments without notifying the office, you may be dismissed from the clinic at the provider's discretion.      For prescription refill requests, have your pharmacy contact our office and allow 72 hours for refills to be completed.    Today you received the following chemotherapy and/or immunotherapy agents: Gemcitabine, Carboplatin.       To help prevent nausea and vomiting after your treatment, we encourage you to take your nausea medication as directed.  BELOW ARE SYMPTOMS THAT SHOULD BE REPORTED IMMEDIATELY: *FEVER GREATER THAN 100.4 F (38 C) OR HIGHER *CHILLS OR SWEATING *NAUSEA AND VOMITING THAT IS NOT CONTROLLED WITH YOUR NAUSEA MEDICATION *UNUSUAL SHORTNESS OF BREATH *UNUSUAL BRUISING OR BLEEDING *URINARY PROBLEMS (pain or burning when urinating, or frequent urination) *BOWEL PROBLEMS (unusual diarrhea, constipation, pain near the anus) TENDERNESS IN MOUTH AND THROAT WITH OR WITHOUT PRESENCE OF ULCERS (sore throat, sores in mouth, or a toothache) UNUSUAL RASH, SWELLING OR PAIN  UNUSUAL VAGINAL DISCHARGE OR ITCHING   Items with * indicate a potential emergency and should be followed up as soon as possible or go to the Emergency Department if any problems should occur.  Please show the CHEMOTHERAPY ALERT CARD  or IMMUNOTHERAPY ALERT CARD at check-in to the Emergency Department and triage nurse.  Should you have questions after your visit or need to cancel or reschedule your appointment, please contact [facility] - A DEPT OF [facility]  Dept: [phone]  and follow the prompts.  Office hours are 8:00 a.m. to 4:30 p.m. Monday - Friday. Please note that voicemails left after 4:00 p.m. may not be returned until the following business day.  We are closed weekends and major holidays. You have access to a nurse at all times for urgent questions. Please call the main number to the clinic Dept: [phone] and follow the prompts.   For any non-urgent questions, you may also contact your provider using MyChart. We now offer e-Visits for anyone 32 and older to request care online for non-urgent symptoms. For details visit [email or web address].   Also download the MyChart app! Go to the app store, search "MyChart", open the app, select [facility] [place], and log in with your MyChart username and password.

## 2023-08-04 ENCOUNTER — Encounter: Payer: Medicare Other | Admitting: Gastroenterology

## 2023-08-04 DIAGNOSIS — M62838 Other muscle spasm: Secondary | ICD-10-CM | POA: Diagnosis not present

## 2023-08-04 DIAGNOSIS — M6289 Other specified disorders of muscle: Secondary | ICD-10-CM | POA: Diagnosis not present

## 2023-08-04 DIAGNOSIS — R1030 Lower abdominal pain, unspecified: Secondary | ICD-10-CM | POA: Diagnosis not present

## 2023-08-06 DIAGNOSIS — C651 Malignant neoplasm of right renal pelvis: Secondary | ICD-10-CM | POA: Diagnosis not present

## 2023-08-10 ENCOUNTER — Encounter: Payer: Self-pay | Admitting: Internal Medicine

## 2023-08-10 ENCOUNTER — Inpatient Hospital Stay: Payer: Medicare Other

## 2023-08-10 ENCOUNTER — Inpatient Hospital Stay: Payer: Medicare Other | Attending: Internal Medicine

## 2023-08-10 ENCOUNTER — Other Ambulatory Visit: Payer: Self-pay | Admitting: Medical Oncology

## 2023-08-10 ENCOUNTER — Other Ambulatory Visit: Payer: Self-pay | Admitting: Nurse Practitioner

## 2023-08-10 VITALS — BP 117/74 | HR 98 | Temp 97.9°F | Resp 18 | Wt 116.4 lb

## 2023-08-10 DIAGNOSIS — Z923 Personal history of irradiation: Secondary | ICD-10-CM | POA: Insufficient documentation

## 2023-08-10 DIAGNOSIS — C3412 Malignant neoplasm of upper lobe, left bronchus or lung: Secondary | ICD-10-CM | POA: Insufficient documentation

## 2023-08-10 DIAGNOSIS — C7800 Secondary malignant neoplasm of unspecified lung: Secondary | ICD-10-CM

## 2023-08-10 DIAGNOSIS — T451X5A Adverse effect of antineoplastic and immunosuppressive drugs, initial encounter: Secondary | ICD-10-CM | POA: Diagnosis not present

## 2023-08-10 DIAGNOSIS — Z5189 Encounter for other specified aftercare: Secondary | ICD-10-CM | POA: Insufficient documentation

## 2023-08-10 DIAGNOSIS — Z905 Acquired absence of kidney: Secondary | ICD-10-CM | POA: Diagnosis not present

## 2023-08-10 DIAGNOSIS — Z87891 Personal history of nicotine dependence: Secondary | ICD-10-CM | POA: Insufficient documentation

## 2023-08-10 DIAGNOSIS — G893 Neoplasm related pain (acute) (chronic): Secondary | ICD-10-CM | POA: Diagnosis not present

## 2023-08-10 DIAGNOSIS — D6481 Anemia due to antineoplastic chemotherapy: Secondary | ICD-10-CM | POA: Diagnosis not present

## 2023-08-10 DIAGNOSIS — Z9221 Personal history of antineoplastic chemotherapy: Secondary | ICD-10-CM | POA: Diagnosis not present

## 2023-08-10 DIAGNOSIS — C7951 Secondary malignant neoplasm of bone: Secondary | ICD-10-CM | POA: Insufficient documentation

## 2023-08-10 DIAGNOSIS — Z515 Encounter for palliative care: Secondary | ICD-10-CM

## 2023-08-10 DIAGNOSIS — Z95828 Presence of other vascular implants and grafts: Secondary | ICD-10-CM

## 2023-08-10 DIAGNOSIS — Z5111 Encounter for antineoplastic chemotherapy: Secondary | ICD-10-CM | POA: Diagnosis not present

## 2023-08-10 DIAGNOSIS — J9811 Atelectasis: Secondary | ICD-10-CM | POA: Diagnosis not present

## 2023-08-10 DIAGNOSIS — D696 Thrombocytopenia, unspecified: Secondary | ICD-10-CM | POA: Diagnosis not present

## 2023-08-10 DIAGNOSIS — C3490 Malignant neoplasm of unspecified part of unspecified bronchus or lung: Secondary | ICD-10-CM

## 2023-08-10 DIAGNOSIS — C679 Malignant neoplasm of bladder, unspecified: Secondary | ICD-10-CM | POA: Insufficient documentation

## 2023-08-10 DIAGNOSIS — E860 Dehydration: Secondary | ICD-10-CM | POA: Diagnosis not present

## 2023-08-10 DIAGNOSIS — R000 Tachycardia, unspecified: Secondary | ICD-10-CM | POA: Insufficient documentation

## 2023-08-10 DIAGNOSIS — R5383 Other fatigue: Secondary | ICD-10-CM

## 2023-08-10 DIAGNOSIS — G4700 Insomnia, unspecified: Secondary | ICD-10-CM

## 2023-08-10 LAB — CMP (CANCER CENTER ONLY)
ALT: 24 U/L (ref 0–44)
AST: 20 U/L (ref 15–41)
Albumin: 3.6 g/dL (ref 3.5–5.0)
Alkaline Phosphatase: 78 U/L (ref 38–126)
Anion gap: 5 (ref 5–15)
BUN: 23 mg/dL (ref 8–23)
CO2: 29 mmol/L (ref 22–32)
Calcium: 9.3 mg/dL (ref 8.9–10.3)
Chloride: 100 mmol/L (ref 98–111)
Creatinine: 1.07 mg/dL — ABNORMAL HIGH (ref 0.44–1.00)
GFR, Estimated: 55 mL/min — ABNORMAL LOW (ref >60)
Glucose, Bld: 127 mg/dL — ABNORMAL HIGH (ref 70–99)
Potassium: 4.2 mmol/L (ref 3.5–5.1)
Sodium: 134 mmol/L — ABNORMAL LOW (ref 135–145)
Total Bilirubin: 0.3 mg/dL (ref 0.0–1.2)
Total Protein: 6.9 g/dL (ref 6.5–8.1)

## 2023-08-10 LAB — CBC WITH DIFFERENTIAL (CANCER CENTER ONLY)
Abs Immature Granulocytes: 0.01 10*3/uL (ref 0.00–0.07)
Basophils Absolute: 0 10*3/uL (ref 0.0–0.1)
Basophils Relative: 0 %
Eosinophils Absolute: 0 10*3/uL (ref 0.0–0.5)
Eosinophils Relative: 1 %
HCT: 24.7 % — ABNORMAL LOW (ref 36.0–46.0)
Hemoglobin: 8.2 g/dL — ABNORMAL LOW (ref 12.0–15.0)
Immature Granulocytes: 0 %
Lymphocytes Relative: 3 %
Lymphs Abs: 0.1 10*3/uL — ABNORMAL LOW (ref 0.7–4.0)
MCH: 29.9 pg (ref 26.0–34.0)
MCHC: 33.2 g/dL (ref 30.0–36.0)
MCV: 90.1 fL (ref 80.0–100.0)
Monocytes Absolute: 0.1 10*3/uL (ref 0.1–1.0)
Monocytes Relative: 5 %
Neutro Abs: 2.2 10*3/uL (ref 1.7–7.7)
Neutrophils Relative %: 91 %
Platelet Count: 94 10*3/uL — ABNORMAL LOW (ref 150–400)
RBC: 2.74 MIL/uL — ABNORMAL LOW (ref 3.87–5.11)
RDW: 17.5 % — ABNORMAL HIGH (ref 11.5–15.5)
WBC Count: 2.4 10*3/uL — ABNORMAL LOW (ref 4.0–10.5)
nRBC: 0 % (ref 0.0–0.2)

## 2023-08-10 LAB — SAMPLE TO BLOOD BANK

## 2023-08-10 LAB — TSH: TSH: 9.414 u[IU]/mL — ABNORMAL HIGH (ref 0.350–4.500)

## 2023-08-10 MED ORDER — DEXAMETHASONE SODIUM PHOSPHATE 10 MG/ML IJ SOLN
10.0000 mg | Freq: Once | INTRAMUSCULAR | Status: AC
  Administered 2023-08-10: 10 mg via INTRAVENOUS
  Filled 2023-08-10: qty 1

## 2023-08-10 MED ORDER — SODIUM CHLORIDE 0.9 % IV SOLN: INTRAVENOUS | Status: DC

## 2023-08-10 MED ORDER — ZOLPIDEM TARTRATE 5 MG PO TABS: 5.0000 mg | ORAL_TABLET | Freq: Every evening | ORAL | 0 refills | Status: DC | PRN

## 2023-08-10 MED ORDER — HEPARIN SOD (PORK) LOCK FLUSH 100 UNIT/ML IV SOLN
500.0000 [IU] | Freq: Once | INTRAVENOUS | Status: AC | PRN
Start: 2023-08-10 — End: 2023-08-10
  Administered 2023-08-10: 500 [IU]

## 2023-08-10 MED ORDER — SODIUM CHLORIDE 0.9% FLUSH
10.0000 mL | INTRAVENOUS | Status: DC | PRN
  Administered 2023-08-10: 10 mL

## 2023-08-10 MED ORDER — SODIUM CHLORIDE 0.9% FLUSH
10.0000 mL | Freq: Once | INTRAVENOUS | Status: AC
  Administered 2023-08-10: 10 mL

## 2023-08-10 MED ORDER — SODIUM CHLORIDE 0.9 % IV SOLN
800.0000 mg/m2 | Freq: Once | INTRAVENOUS | Status: AC
  Administered 2023-08-10: 1217 mg via INTRAVENOUS
  Filled 2023-08-10: qty 32.01

## 2023-08-10 NOTE — Progress Notes (Signed)
 Per Arbutus Ped MD, ok to treat with PLT 94

## 2023-08-10 NOTE — Patient Instructions (Signed)
 [facility] - A DEPT OF [facility]  Discharge Instructions: Thank you for choosing [facility] to provide your oncology and hematology care.   If you have a lab appointment with the Cancer Center, please go directly to the Cancer Center and check in at the registration area.   Wear comfortable clothing and clothing appropriate for easy access to any Portacath or PICC line.   We strive to give you quality time with your provider. You may need to reschedule your appointment if you arrive late (15 or more minutes).  Arriving late affects you and other patients whose appointments are after yours.  Also, if you miss three or more appointments without notifying the office, you may be dismissed from the clinic at the provider's discretion.      For prescription refill requests, have your pharmacy contact our office and allow 72 hours for refills to be completed.    Today you received the following chemotherapy and/or immunotherapy agents: Gemcitabine.       To help prevent nausea and vomiting after your treatment, we encourage you to take your nausea medication as directed.  BELOW ARE SYMPTOMS THAT SHOULD BE REPORTED IMMEDIATELY: *FEVER GREATER THAN 100.4 F (38 C) OR HIGHER *CHILLS OR SWEATING *NAUSEA AND VOMITING THAT IS NOT CONTROLLED WITH YOUR NAUSEA MEDICATION *UNUSUAL SHORTNESS OF BREATH *UNUSUAL BRUISING OR BLEEDING *URINARY PROBLEMS (pain or burning when urinating, or frequent urination) *BOWEL PROBLEMS (unusual diarrhea, constipation, pain near the anus) TENDERNESS IN MOUTH AND THROAT WITH OR WITHOUT PRESENCE OF ULCERS (sore throat, sores in mouth, or a toothache) UNUSUAL RASH, SWELLING OR PAIN  UNUSUAL VAGINAL DISCHARGE OR ITCHING   Items with * indicate a potential emergency and should be followed up as soon as possible or go to the Emergency Department if any problems should occur.  Please show the CHEMOTHERAPY ALERT CARD or  IMMUNOTHERAPY ALERT CARD at check-in to the Emergency Department and triage nurse.  Should you have questions after your visit or need to cancel or reschedule your appointment, please contact [facility] - A DEPT OF [facility]  Dept: [phone]  and follow the prompts.  Office hours are 8:00 a.m. to 4:30 p.m. Monday - Friday. Please note that voicemails left after 4:00 p.m. may not be returned until the following business day.  We are closed weekends and major holidays. You have access to a nurse at all times for urgent questions. Please call the main number to the clinic Dept: [phone] and follow the prompts.   For any non-urgent questions, you may also contact your provider using MyChart. We now offer e-Visits for anyone 19 and older to request care online for non-urgent symptoms. For details visit [email or web address].   Also download the MyChart app! Go to the app store, search "MyChart", open the app, select [facility], and log in with your MyChart username and password.

## 2023-08-10 NOTE — Progress Notes (Signed)
 Lab ordered, tsh , t4

## 2023-08-11 LAB — T4: T4, Total: 9.1 ug/dL (ref 4.5–12.0)

## 2023-08-14 ENCOUNTER — Telehealth: Payer: Self-pay

## 2023-08-14 ENCOUNTER — Other Ambulatory Visit: Payer: Self-pay

## 2023-08-14 ENCOUNTER — Inpatient Hospital Stay: Admitting: Physician Assistant

## 2023-08-14 ENCOUNTER — Inpatient Hospital Stay

## 2023-08-14 ENCOUNTER — Ambulatory Visit (HOSPITAL_COMMUNITY)
Admission: RE | Admit: 2023-08-14 | Discharge: 2023-08-14 | Disposition: A | Discharge status: Home / Self Care | Source: Ambulatory Visit | Attending: Nurse Practitioner | Admitting: Nurse Practitioner

## 2023-08-14 VITALS — BP 110/84 | HR 105 | Temp 97.7°F | Resp 22

## 2023-08-14 DIAGNOSIS — R530 Neoplastic (malignant) related fatigue: Secondary | ICD-10-CM

## 2023-08-14 DIAGNOSIS — C689 Malignant neoplasm of urinary organ, unspecified: Secondary | ICD-10-CM

## 2023-08-14 DIAGNOSIS — R0602 Shortness of breath: Secondary | ICD-10-CM | POA: Insufficient documentation

## 2023-08-14 DIAGNOSIS — R5383 Other fatigue: Secondary | ICD-10-CM

## 2023-08-14 DIAGNOSIS — R000 Tachycardia, unspecified: Secondary | ICD-10-CM

## 2023-08-14 DIAGNOSIS — G893 Neoplasm related pain (acute) (chronic): Secondary | ICD-10-CM | POA: Insufficient documentation

## 2023-08-14 DIAGNOSIS — T451X5A Adverse effect of antineoplastic and immunosuppressive drugs, initial encounter: Secondary | ICD-10-CM | POA: Diagnosis not present

## 2023-08-14 DIAGNOSIS — Z5111 Encounter for antineoplastic chemotherapy: Secondary | ICD-10-CM | POA: Diagnosis not present

## 2023-08-14 DIAGNOSIS — C3412 Malignant neoplasm of upper lobe, left bronchus or lung: Secondary | ICD-10-CM | POA: Diagnosis not present

## 2023-08-14 DIAGNOSIS — C7951 Secondary malignant neoplasm of bone: Secondary | ICD-10-CM | POA: Diagnosis not present

## 2023-08-14 DIAGNOSIS — C3490 Malignant neoplasm of unspecified part of unspecified bronchus or lung: Secondary | ICD-10-CM | POA: Insufficient documentation

## 2023-08-14 DIAGNOSIS — D6481 Anemia due to antineoplastic chemotherapy: Secondary | ICD-10-CM | POA: Diagnosis not present

## 2023-08-14 DIAGNOSIS — Z5189 Encounter for other specified aftercare: Secondary | ICD-10-CM | POA: Diagnosis not present

## 2023-08-14 DIAGNOSIS — J9811 Atelectasis: Secondary | ICD-10-CM | POA: Diagnosis not present

## 2023-08-14 DIAGNOSIS — R7989 Other specified abnormal findings of blood chemistry: Secondary | ICD-10-CM | POA: Diagnosis not present

## 2023-08-14 DIAGNOSIS — R918 Other nonspecific abnormal finding of lung field: Secondary | ICD-10-CM | POA: Diagnosis not present

## 2023-08-14 DIAGNOSIS — Z85118 Personal history of other malignant neoplasm of bronchus and lung: Secondary | ICD-10-CM | POA: Diagnosis not present

## 2023-08-14 DIAGNOSIS — C679 Malignant neoplasm of bladder, unspecified: Secondary | ICD-10-CM | POA: Diagnosis not present

## 2023-08-14 LAB — CBC WITH DIFFERENTIAL (CANCER CENTER ONLY)
Abs Immature Granulocytes: 0.02 10*3/uL (ref 0.00–0.07)
Basophils Absolute: 0 10*3/uL (ref 0.0–0.1)
Basophils Relative: 0 %
Eosinophils Absolute: 0 10*3/uL (ref 0.0–0.5)
Eosinophils Relative: 1 %
HCT: 23.7 % — ABNORMAL LOW (ref 36.0–46.0)
Hemoglobin: 7.9 g/dL — ABNORMAL LOW (ref 12.0–15.0)
Immature Granulocytes: 1 %
Lymphocytes Relative: 2 %
Lymphs Abs: 0.1 10*3/uL — ABNORMAL LOW (ref 0.7–4.0)
MCH: 30.4 pg (ref 26.0–34.0)
MCHC: 33.3 g/dL (ref 30.0–36.0)
MCV: 91.2 fL (ref 80.0–100.0)
Monocytes Absolute: 0 10*3/uL — ABNORMAL LOW (ref 0.1–1.0)
Monocytes Relative: 1 %
Neutro Abs: 3 10*3/uL (ref 1.7–7.7)
Neutrophils Relative %: 95 %
Platelet Count: 61 10*3/uL — ABNORMAL LOW (ref 150–400)
RBC: 2.6 MIL/uL — ABNORMAL LOW (ref 3.87–5.11)
RDW: 17.8 % — ABNORMAL HIGH (ref 11.5–15.5)
Smear Review: NORMAL
WBC Count: 3.2 10*3/uL — ABNORMAL LOW (ref 4.0–10.5)
nRBC: 0 % (ref 0.0–0.2)

## 2023-08-14 LAB — SAMPLE TO BLOOD BANK

## 2023-08-14 LAB — CMP (CANCER CENTER ONLY)
ALT: 29 U/L (ref 0–44)
AST: 19 U/L (ref 15–41)
Albumin: 3.8 g/dL (ref 3.5–5.0)
Alkaline Phosphatase: 81 U/L (ref 38–126)
Anion gap: 8 (ref 5–15)
BUN: 31 mg/dL — ABNORMAL HIGH (ref 8–23)
CO2: 26 mmol/L (ref 22–32)
Calcium: 9.2 mg/dL (ref 8.9–10.3)
Chloride: 102 mmol/L (ref 98–111)
Creatinine: 1.38 mg/dL — ABNORMAL HIGH (ref 0.44–1.00)
GFR, Estimated: 41 mL/min — ABNORMAL LOW (ref >60)
Glucose, Bld: 104 mg/dL — ABNORMAL HIGH (ref 70–99)
Potassium: 3.9 mmol/L (ref 3.5–5.1)
Sodium: 136 mmol/L (ref 135–145)
Total Bilirubin: 0.3 mg/dL (ref 0.0–1.2)
Total Protein: 7.1 g/dL (ref 6.5–8.1)

## 2023-08-14 LAB — MAGNESIUM: Magnesium: 2.2 mg/dL (ref 1.7–2.4)

## 2023-08-14 LAB — PREPARE RBC (CROSSMATCH)

## 2023-08-14 MED ORDER — SODIUM CHLORIDE 0.9% IV SOLUTION
250.0000 mL | INTRAVENOUS | Status: DC
Start: 2023-08-14 — End: 2023-08-14
  Administered 2023-08-14: 100 mL via INTRAVENOUS

## 2023-08-14 MED ORDER — SODIUM CHLORIDE 0.9 % IV SOLN: Freq: Once | INTRAVENOUS | Status: AC

## 2023-08-14 MED ORDER — SODIUM CHLORIDE 0.9% FLUSH
10.0000 mL | INTRAVENOUS | Status: AC | PRN
Start: 2023-08-14 — End: 2023-08-14
  Administered 2023-08-14: 10 mL

## 2023-08-14 MED ORDER — HEPARIN SOD (PORK) LOCK FLUSH 100 UNIT/ML IV SOLN
500.0000 [IU] | Freq: Every day | INTRAVENOUS | Status: AC | PRN
  Administered 2023-08-14: 500 [IU]

## 2023-08-14 NOTE — Progress Notes (Signed)
 Symptom Management Consult Note [facility]    Patient Care Team: [name], MD as PCP - General (Family Medicine) August Saucer, Corrie Mckusick, MD as Consulting Physician (Orthopedic Surgery) Canary Brim, OD as Consulting Physician (Ophthalmology) Delton Coombes Les Pou, MD as Consulting Physician (Pulmonary Disease) Si Gaul, MD as Consulting Physician (Oncology) Pickenpack-Cousar, Arty Baumgartner, NP as Nurse Practitioner [facility] and Palliative Medicine)    Name / MRN / DOB: Evelyn Jordan  [id number]  30-Jun-1950   Date of visit: 08/14/2023   Chief Complaint/Reason for visit: shortness of breath, fatigue and tachycardia   Current Therapy: Carboplatin and gemcitabine  Last treatment:  Day 8   Cycle 3 on 08/10/23   ASSESSMENT & PLAN: Patient is a 73 y.o. 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 Arbutus Ped.  I have viewed most recent oncology note and lab work.    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 Patient tachycardic on arrival to 130, resolved at rest. - CBC showing hemoglobin at 7.9.  - Transfuse one unit of blood in clinic today. - Chart review shows last transfusion was 07/21/23.  #Thrombocytopenia - Platelet count at 61,000 with increased bleeding risk, easy bruising, and occasional epistaxis. - Not anticoagulated. Advised to monitor for signs of bleeding.  #Shortness of breath - Lung exam without wheezing or rales. - Chest xray does not show acute infectious process. - EKG shows normal sinus rhythm.  #Dehydration - Inadequate fluid intake with rising creatinine from 1.07 to 1.38, indicating worsened kidney function. - Administer 500 mL of IV fluids. - Encourage increased oral fluid intake. Reassessed patient after infusion and transfusion and she reports feeling much improved. Not tachycardic.     Strict ED precautions discussed should symptoms worsen.   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   Evelyn Jordan is a 73 y.o. female with oncologic history as above presenting to [facility] today with chief complaint of shortness of breath, tachycardia and fatigue. Patient is accompanied by her daughter who provides additional history.  She experiences worsening shortness of breath and fatigue, which began more profoundly yesterday. She has extreme shortness of breath and tachycardia after walking short distances, such as 20 feet, with her heart  rate increasing from the 90s to the 130s. These symptoms subside after resting for three to four minutes. She is unable to walk 50 feet without needing to stop and rest. Her activity level is about 50% of what it was two weeks ago, and she has not been outside the  house for a week and a half. She denies chest pain, but she can tell when her heart is racing and confirms that the shortness of breath is more rapid in onset compared to previous episodes. She experiences occasional dry cough but no fevers or chills. She had a cold approximately 1 month ago that she has resolved. She reports easy bruising and a two-week history of right nostril bleeding, which she describes as 'old blood' when blowing her nose. No active bleeding. She is not anticoagulated.   Her fluid intake could be better, averaging two bottles a day, and she experiences a sensation of 'pouring something in an empty bucket' when drinking fluids. No nausea, and she mentions having a muffin for breakfast without any drink.  ROS  All other systems are reviewed and are negative for acute change except as noted in the HPI.    Allergies  Allergen Reactions   Emend [Fosaprepitant Dimeglumine] Other (See Comments)    Chest tightness with first Emend reaction.  See Progress Note 06/16/23.   Compazine [Prochlorperazine Edisylate] Other (See Comments)    "Neck hyperextends"     Past Medical History:  Diagnosis Date   Depression    Fatty liver    Insomnia    Lung cancer (HCC) 07/25/2022   Osteoporosis    Pericardial effusion 03/05/2023   small noted on ECHO   Pulmonary nodule    Thyroid nodule      Past Surgical History:  Procedure Laterality Date   ANKLE SURGERY Right    BREAST BIOPSY Right    benign 2013   BRONCHIAL BIOPSY  08/04/2022   Procedure: BRONCHIAL BIOPSIES;  Surgeon: Leslye Peer, MD;  Location: [facility] ENDOSCOPY;  Service: Cardiopulmonary;;   BRONCHIAL BIOPSY  06/22/2023   Procedure: BRONCHIAL BIOPSIES;  Surgeon: Leslye Peer, MD;  Location: [facility] ENDOSCOPY;  Service: Pulmonary;;   BRONCHIAL BRUSHINGS  08/04/2022   Procedure: BRONCHIAL BRUSHINGS;  Surgeon: Leslye Peer, MD;  Location: [facility] ENDOSCOPY;  Service: Cardiopulmonary;;   BRONCHIAL BRUSHINGS  06/22/2023    Procedure: BRONCHIAL BRUSHINGS;  Surgeon: Leslye Peer, MD;  Location: [facility] ENDOSCOPY;  Service: Pulmonary;;   BRONCHIAL NEEDLE ASPIRATION BIOPSY  06/22/2023   Procedure: BRONCHIAL NEEDLE ASPIRATION BIOPSIES;  Surgeon: Leslye Peer, MD;  Location: [facility] ENDOSCOPY;  Service: Pulmonary;;   BRONCHIAL WASHINGS  06/22/2023   Procedure: BRONCHIAL WASHINGS;  Surgeon: Leslye Peer, MD;  Location: [facility] ENDOSCOPY;  Service: Pulmonary;;   COLONOSCOPY     CYSTOSCOPY WITH BIOPSY N/A 05/15/2023   Procedure: CYSTOSCOPY WITH RIGHT RENAL PELVIS BIOPSY;  Surgeon: Rene Paci, MD;  Location: [facility] ORS;  Service: Urology;  Laterality: N/A;   CYSTOSCOPY WITH STENT PLACEMENT Right 05/26/2023   Procedure: CYSTOSCOPY WITH RIGHT STENT PLACEMENT; TUR OF RIGHT URETERAL ORISICE;  Surgeon: Rene Paci, MD;  Location: [facility] ORS;  Service: Urology;  Laterality: Right;   CYSTOSCOPY/RETROGRADE/URETEROSCOPY N/A 05/15/2023   Procedure: CYSTOSCOPY, RIGHT RETROGRADE, RIGHT URETEROSCOPY, RIGHT STENT PLACEMENT;  Surgeon: Rene Paci, MD;  Location: [facility] ORS;  Service: Urology;  Laterality: N/A;  30 MINUTES   HEMOSTASIS CONTROL  08/04/2022   Procedure: HEMOSTASIS CONTROL;  Surgeon: Leslye Peer, MD;  Location:  [facility] ENDOSCOPY;  Service: Cardiopulmonary;;   IR IMAGING GUIDED PORT INSERTION  09/04/2022   repair of a torn meniscus Left 05/2018   ROBOT ASSITED LAPAROSCOPIC NEPHROURETERECTOMY Right 05/26/2023   Procedure: XI ROBOT ASSITED LAPAROSCOPIC NEPHROURETERECTOMY;  Surgeon: Rene Paci, MD;  Location: [facility] ORS;  Service: Urology;  Laterality: Right;  210 MINS FOR CASE   VIDEO BRONCHOSCOPY Left 08/04/2022   Procedure: VIDEO BRONCHOSCOPY WITH FLUORO;  Surgeon: Leslye Peer, MD;  Location: [facility] ENDOSCOPY;  Service: Cardiopulmonary;  Laterality: Left;    Social History   Socioeconomic History   Marital status: Married    Spouse name: Archie   Number of children: Not on file   Years of  education: Not on file   Highest education level: Bachelor's degree (e.g., BA, AB, BS)  Occupational History    Comment: retired Engineer, civil (consulting)   Tobacco Use   Smoking status: Former    Current packs/day: 0.00    Average packs/day: 1 pack/day for 41.0 years (41.0 ttl pk-yrs)    Types: Cigarettes    Start date: 17    Quit date: 2012    Years since quitting: 13.1   Smokeless tobacco: Never  Vaping Use   Vaping status: Never Used  Substance and Sexual Activity   Alcohol use: Yes    Comment: Rarely   Drug use: No   Sexual activity: Not on file  Other Topics Concern   Not on file  Social History Narrative   Not on file   Social Drivers of Health   Financial Resource Strain: Low Risk  (04/29/2023)   Overall Financial Resource Strain (CARDIA)    Difficulty of Paying Living Expenses: Not hard at all  Food Insecurity: No Food Insecurity (06/23/2023)   Hunger Vital Sign    Worried About Running Out of Food in the Last Year: Never true    Ran Out of Food in the Last Year: Never true  Transportation Needs: No Transportation Needs (06/23/2023)   PRAPARE - Administrator, Civil Service (Medical): No    Lack of Transportation (Non-Medical): No  Physical Activity: Sufficiently Active (04/29/2023)   Exercise Vital Sign    Days of Exercise per Week: 4 days    Minutes of Exercise per Session: 40 min  Stress: No Stress Concern Present (04/29/2023)   [employer] of Occupational Health - Occupational Stress Questionnaire    Feeling of Stress : Only a little  Social Connections: Unknown (04/29/2023)   Social Connection and Isolation Panel [NHANES]    Frequency of Communication with Friends and Family: Once a week    Frequency of Social Gatherings with Friends and Family: Once a week    Attends Religious Services: Patient declined    Database administrator or Organizations: No    Attends Banker Meetings: Never    Marital Status: Married  Catering manager Violence:  Not At Risk (06/23/2023)   Humiliation, Afraid, Rape, and Kick questionnaire    Fear of Current or Ex-Partner: No    Emotionally Abused: No    Physically Abused: No    Sexually Abused: No    Family History  Problem Relation Age of Onset   Alzheimer's disease Mother    Hypertension Father    Stroke Father    Hyperlipidemia Brother    Breast cancer Neg Hx      Current Outpatient Medications:    acetaminophen (TYLENOL) 325 MG tablet, Take 2 tablets (650 mg total) by mouth every 6 (six)  hours as needed for mild pain (pain score 1-3) (or Fever >/= 101)., Disp: , Rfl:    ALPRAZolam (XANAX) 0.5 MG tablet, Take 0.5 mg by mouth daily as needed for anxiety or sleep., Disp: , Rfl:    bisacodyl (DULCOLAX) 5 MG EC tablet, Take 10-15 mg by mouth daily as needed for moderate constipation., Disp: , Rfl:    Iron, Ferrous Sulfate, 325 (65 Fe) MG TABS, Take 65 mg by mouth daily with breakfast., Disp: , Rfl:    levothyroxine (SYNTHROID) 100 MCG tablet, Take 1 tablet (100 mcg total) by mouth daily at 6 (six) AM., Disp: 30 tablet, Rfl: 2   methylPREDNISolone (MEDROL DOSEPAK) 4 MG TBPK tablet, Take 6 pills by mouth day 1, 5 on day 2, 4 on day 3, 3 on day 4, 2 on day 5, 1 on day 6, Disp: 21 tablet, Rfl: 0   ondansetron (ZOFRAN) 8 MG tablet, Take 1 tablet (8 mg total) by mouth every 8 (eight) hours as needed for nausea or vomiting. Start after day 3 of chemotherapy if needed, Disp: 30 tablet, Rfl: 2   oxyCODONE-acetaminophen (PERCOCET/ROXICET) 5-325 MG tablet, Take 1 tablet by mouth every 6 (six) hours as needed for severe pain (pain score 7-10)., Disp: 40 tablet, Rfl: 0   zolpidem (AMBIEN) 5 MG tablet, Take 1-2 tablets (5-10 mg total) by mouth at bedtime as needed for sleep., Disp: 45 tablet, Rfl: 0 No current facility-administered medications for this visit.  Facility-Administered Medications Ordered in Other Visits:    0.9 %  sodium chloride infusion (Manually program via Guardrails IV Fluids), 250 mL,  Intravenous, Continuous, Walisiewicz, Milany Geck E, PA-C, Stopped at 08/14/23 1416  PHYSICAL EXAM: ECOG FS:1 - Symptomatic but completely ambulatory    Vitals:   08/14/23 0901  BP: 110/84  Pulse: (!) 105  Resp: (!) 22  Temp: 97.7 F (36.5 C)  TempSrc: Temporal  SpO2: 100%   Physical Exam Vitals and nursing note reviewed.  Constitutional:      Appearance: She is not ill-appearing or toxic-appearing.  HENT:     Head: Normocephalic.  Eyes:     Conjunctiva/sclera: Conjunctivae normal.  Cardiovascular:     Rate and Rhythm: Normal rate and regular rhythm.     Pulses: Normal pulses.     Heart sounds: Normal heart sounds.     Comments: HR in the 70s during exam Pulmonary:     Effort: Pulmonary effort is normal.     Breath sounds: Normal breath sounds.  Abdominal:     General: There is no distension.  Musculoskeletal:     Cervical back: Normal range of motion.     Right lower leg: No edema.     Left lower leg: No edema.  Skin:    General: Skin is warm and dry.  Neurological:     Mental Status: She is alert.        LABORATORY DATA: I have reviewed the data as listed    Latest Ref Rng & Units 08/14/2023   10:05 AM 08/10/2023    2:00 PM 08/03/2023    8:50 AM  CBC  WBC 4.0 - 10.5 K/uL 3.2  2.4  4.5   Hemoglobin 12.0 - 15.0 g/dL 7.9  8.2  8.9   Hematocrit 36.0 - 46.0 % 23.7  24.7  27.4   Platelets 150 - 400 K/uL 61  94  158         Latest Ref Rng & Units 08/14/2023   10:05 AM 08/10/2023  2:00 PM 08/03/2023    8:50 AM  CMP  Glucose 70 - 99 mg/dL 295  284  132   BUN 8 - 23 mg/dL 31  23  30    Creatinine 0.44 - 1.00 mg/dL 4.40  1.02  7.25   Sodium 135 - 145 mmol/L 136  134  135   Potassium 3.5 - 5.1 mmol/L 3.9  4.2  4.0   Chloride 98 - 111 mmol/L 102  100  103   CO2 22 - 32 mmol/L 26  29  27    Calcium 8.9 - 10.3 mg/dL 9.2  9.3  9.0   Total Protein 6.5 - 8.1 g/dL 7.1  6.9  6.5   Total Bilirubin 0.0 - 1.2 mg/dL 0.3  0.3  0.2   Alkaline Phos 38 - 126 U/L 81  78  63    AST 15 - 41 U/L 19  20  19    ALT 0 - 44 U/L 29  24  20         RADIOGRAPHIC STUDIES (from last 24 hours if applicable) I have personally reviewed the radiological images as listed and agreed with the findings in the report. DG Chest 2 View Result Date: 08/14/2023 CLINICAL DATA:  Shortness of breath.  History of lung cancer. EXAM: CHEST - 2 VIEW COMPARISON:  June 22, 2023.  July 26, 2023. FINDINGS: Stable cardiomediastinal silhouette. Right internal jugular Port-A-Cath is unchanged. Stable left perihilar density is noted which may represent post treatment change, although underlying neoplasm cannot be excluded. Stable bilateral pulmonary nodules are noted. Bony thorax is unremarkable. IMPRESSION: Stable left perihilar density is noted which may represent post treatment change, although underlying neoplasm cannot be excluded. Stable bilateral pulmonary nodules are noted. No new abnormality seen. Electronically Signed   By: Lupita Raider M.D.   On: 08/14/2023 11:52        Visit Diagnosis: 1. Urothelial cancer (HCC)   2. Tachycardia   3. Anemia due to antineoplastic chemotherapy   4. Blood creatinine increased compared with prior measurement   5. Shortness of breath      Orders Placed This Encounter  Procedures   EKG 12-Lead    All questions were answered. The patient knows to call the clinic with any problems, questions or concerns. No barriers to learning was detected.  A total of more than 30 minutes were spent on this encounter with face-to-face time and non-face-to-face time, including preparing to see the patient, ordering tests and/or medications, counseling the patient and coordination of care as outlined above.    Thank you for allowing me to participate in the care of this patient.    Shanon Ace, PA-C Department of Hematology/Oncology [facility] Phone: [phone]  [phone]    08/14/2023 4:02  PM

## 2023-08-14 NOTE — Telephone Encounter (Signed)
 Pt sent MyChart Message c/o SOB, tachycardia with activity, and increased fatigue. Orders placed for lab and CXR per Lowella Bandy, NP, appt made with symptom management ok per [place], [place]. Pt made aware of CXR and upcoming appts.

## 2023-08-14 NOTE — Patient Instructions (Signed)

## 2023-08-15 ENCOUNTER — Other Ambulatory Visit: Payer: Self-pay | Admitting: Nurse Practitioner

## 2023-08-15 DIAGNOSIS — Z515 Encounter for palliative care: Secondary | ICD-10-CM

## 2023-08-15 DIAGNOSIS — G4700 Insomnia, unspecified: Secondary | ICD-10-CM

## 2023-08-15 DIAGNOSIS — C3490 Malignant neoplasm of unspecified part of unspecified bronchus or lung: Secondary | ICD-10-CM

## 2023-08-15 MED ORDER — ZOLPIDEM TARTRATE 10 MG PO TABS: 5.0000 mg | ORAL_TABLET | Freq: Every evening | ORAL | 1 refills | Status: DC | PRN

## 2023-08-17 ENCOUNTER — Ambulatory Visit: Payer: Medicare Other

## 2023-08-17 ENCOUNTER — Telehealth: Payer: Self-pay

## 2023-08-17 ENCOUNTER — Other Ambulatory Visit: Payer: Medicare Other

## 2023-08-17 ENCOUNTER — Ambulatory Visit: Payer: Medicare Other | Admitting: Physician Assistant

## 2023-08-17 LAB — BPAM RBC
Blood Product Expiration Date: 202503302359
ISSUE DATE / TIME: 202503071218
Unit Type and Rh: 6200

## 2023-08-17 LAB — TYPE AND SCREEN
ABO/RH(D): A POS
Antibody Screen: NEGATIVE
Unit division: 0

## 2023-08-17 NOTE — Telephone Encounter (Signed)
 Spoke with patient in regards to [employer] sent. Patient stated that she felt great after blood product on Friday and has slowly noticed a decline in energy over the weekend.  Per Dr. Shella Spearing, PA- Offered patient lab recheck.  Scheduled patient for labs at 145 on Wednesday.  Informed patient if symptoms worsen, to go to the ER. Encouraged patient to keep appt with Dr. Delton Coombes on Wednesday and see what his recommendations are. Patient verbalized understanding.

## 2023-08-18 ENCOUNTER — Ambulatory Visit
Admission: RE | Admit: 2023-08-18 | Discharge: 2023-08-18 | Disposition: A | Discharge status: Home / Self Care | Payer: Medicare Other | Source: Ambulatory Visit | Attending: Internal Medicine | Admitting: Internal Medicine

## 2023-08-18 NOTE — Progress Notes (Signed)
  Radiation Oncology         [phone] ________________________________  Name: Evelyn Jordan MRN: [id number]  Date of Service: 08/18/2023  DOB: 06-09-1951  Post Treatment Telephone Note  Diagnosis:  C34.12 Malignant neoplasm of upper lobe, left bronchus or lung (as documented in provider EOT note)  The patient was available for call today.   Symptoms of fatigue have not improved since completing therapy.  Symptoms of skin changes have improved since completing therapy.  Symptoms of esophagitis have improved since completing therapy.  Patient also reports severe SOB, worsens w/ any exertion and tachycardia. Dr. Si Gaul is aware of this.  The patient has scheduled follow up with her medical oncologist Dr. Si Gaul for ongoing care, and was encouraged to call if she develops concerns or questions regarding radiation.   This concludes the interaction.  Ruel Favors, LPN

## 2023-08-19 ENCOUNTER — Encounter: Payer: Self-pay | Admitting: Emergency Medicine

## 2023-08-19 ENCOUNTER — Encounter: Payer: Self-pay | Admitting: Internal Medicine

## 2023-08-19 ENCOUNTER — Telehealth: Payer: Self-pay

## 2023-08-19 ENCOUNTER — Ambulatory Visit: Admitting: Emergency Medicine

## 2023-08-19 ENCOUNTER — Inpatient Hospital Stay

## 2023-08-19 ENCOUNTER — Other Ambulatory Visit: Payer: Self-pay | Admitting: Physician Assistant

## 2023-08-19 ENCOUNTER — Other Ambulatory Visit: Payer: Self-pay

## 2023-08-19 VITALS — BP 114/74 | HR 74 | Ht 63.0 in | Wt 117.0 lb

## 2023-08-19 DIAGNOSIS — C679 Malignant neoplasm of bladder, unspecified: Secondary | ICD-10-CM | POA: Diagnosis not present

## 2023-08-19 DIAGNOSIS — R0602 Shortness of breath: Secondary | ICD-10-CM

## 2023-08-19 DIAGNOSIS — Z87891 Personal history of nicotine dependence: Secondary | ICD-10-CM

## 2023-08-19 DIAGNOSIS — R0600 Dyspnea, unspecified: Secondary | ICD-10-CM

## 2023-08-19 DIAGNOSIS — J9811 Atelectasis: Secondary | ICD-10-CM | POA: Diagnosis not present

## 2023-08-19 DIAGNOSIS — D696 Thrombocytopenia, unspecified: Secondary | ICD-10-CM

## 2023-08-19 DIAGNOSIS — C3412 Malignant neoplasm of upper lobe, left bronchus or lung: Secondary | ICD-10-CM | POA: Diagnosis not present

## 2023-08-19 DIAGNOSIS — C3490 Malignant neoplasm of unspecified part of unspecified bronchus or lung: Secondary | ICD-10-CM

## 2023-08-19 DIAGNOSIS — T451X5A Adverse effect of antineoplastic and immunosuppressive drugs, initial encounter: Secondary | ICD-10-CM

## 2023-08-19 DIAGNOSIS — Z5189 Encounter for other specified aftercare: Secondary | ICD-10-CM | POA: Diagnosis not present

## 2023-08-19 DIAGNOSIS — C7951 Secondary malignant neoplasm of bone: Secondary | ICD-10-CM | POA: Diagnosis not present

## 2023-08-19 DIAGNOSIS — Z5111 Encounter for antineoplastic chemotherapy: Secondary | ICD-10-CM | POA: Diagnosis not present

## 2023-08-19 LAB — CBC WITH DIFFERENTIAL (CANCER CENTER ONLY)
Abs Immature Granulocytes: 0 10*3/uL (ref 0.00–0.07)
Basophils Absolute: 0 10*3/uL (ref 0.0–0.1)
Basophils Relative: 0 %
Eosinophils Absolute: 0 10*3/uL (ref 0.0–0.5)
Eosinophils Relative: 5 %
HCT: 24.4 % — ABNORMAL LOW (ref 36.0–46.0)
Hemoglobin: 8.1 g/dL — ABNORMAL LOW (ref 12.0–15.0)
Immature Granulocytes: 0 %
Lymphocytes Relative: 9 %
Lymphs Abs: 0.1 10*3/uL — ABNORMAL LOW (ref 0.7–4.0)
MCH: 29.8 pg (ref 26.0–34.0)
MCHC: 33.2 g/dL (ref 30.0–36.0)
MCV: 89.7 fL (ref 80.0–100.0)
Monocytes Absolute: 0.1 10*3/uL (ref 0.1–1.0)
Monocytes Relative: 8 %
Neutro Abs: 0.6 10*3/uL — ABNORMAL LOW (ref 1.7–7.7)
Neutrophils Relative %: 78 %
Platelet Count: 11 10*3/uL — ABNORMAL LOW (ref 150–400)
RBC: 2.72 MIL/uL — ABNORMAL LOW (ref 3.87–5.11)
RDW: 16.8 % — ABNORMAL HIGH (ref 11.5–15.5)
Smear Review: NORMAL
WBC Count: 0.8 10*3/uL — CL (ref 4.0–10.5)
nRBC: 0 % (ref 0.0–0.2)

## 2023-08-19 LAB — PREPARE RBC (CROSSMATCH)

## 2023-08-19 LAB — SAMPLE TO BLOOD BANK

## 2023-08-19 NOTE — Telephone Encounter (Signed)
 Patient is aware of scheduled appts. Patient verbalized understanding.

## 2023-08-19 NOTE — Telephone Encounter (Signed)
 Spoke with patient in regards to lab results.  Per Cassie, PA- patient needs 1 unit of platelets, Zarxio injection x3 days and 1 unit of blood because patient is symptomatic. Informed patient of above and will call with appt times once scheduled.

## 2023-08-19 NOTE — Progress Notes (Signed)
 Subjective:    Patient ID: Evelyn Jordan, female    DOB: [birth year], 73 y.o.   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  Also had pleural effusion, pericardial effusion.  Repeat CT scan of the chest done 03/05/2023 reviewed by me, shows rate patient change in the area of her lingular mass no evidence of metastatic disease.  This been some improvement in left midlung infiltrate. She has persistent cough, some hoarseness. Exertional SOB when exerting. Also sets off coughing   Acute office visit 08/19/2023 --Evelyn Jordan is 73 with a history of lingular squamous cell cancer that has been treated with chemoradiation followed by Imfinzi.  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 Then dx with metastatic urothelial carcinoma . She is currently on carboplatin and gemcitabine.  She has hoarseness, chronic cough.  She is here today noting.  She was dyspneic and underwent transfusion on 08/14/2023.  Hemoglobin today shows 8.1.   CBC done today showed WBC 0.8 (from 3.25 days ago), hemoglobin 8.1 (7.9), platelet count 11 (61)  Her pulmonary function testing from 04/30/2023 shows normal spirometry, normal lung volumes, decreased diffusion capacity that corrects to normal range when adjusted for alveolar volume and hemoglobin.   Review of Systems As per HPI  Past Medical History:  Diagnosis Date   Depression    Fatty liver    Insomnia    Lung cancer (HCC) 07/25/2022   Osteoporosis    Pericardial effusion 03/05/2023   small noted on ECHO   Pulmonary nodule    Thyroid nodule      Family History  Problem Relation Age of Onset   Alzheimer's disease Mother    Hypertension Father    Stroke Father    Hyperlipidemia Brother    Breast cancer Neg Hx      Social  History   Socioeconomic History   Marital status: Married    Spouse name: Archie   Number of children: Not on file   Years of education: Not on file   Highest education level: Bachelor's degree (e.g., BA, AB, BS)  Occupational History    Comment: retired Engineer, civil (consulting)   Tobacco Use   Smoking status: Former    Current packs/day: 0.00    Average packs/day: 1 pack/day for 41.0 years (41.0 ttl pk-yrs)    Types: Cigarettes    Start date: 16    Quit date: 2012    Years since quitting: 13.2   Smokeless tobacco: Never  Vaping Use   Vaping status: Never Used  Substance and Sexual Activity   Alcohol use: Yes    Comment: Rarely   Drug use: No   Sexual activity: Not on file  Other Topics Concern   Not on file  Social History Narrative   Not on file   Social Drivers of Health   Financial Resource Strain: Low Risk  (04/29/2023)   Overall Financial Resource Strain (CARDIA)    Difficulty of Paying Living Expenses: Not hard at all  Food Insecurity: No Food Insecurity (06/23/2023)   Hunger Vital Sign    Worried About Running Out of Food in the Last Year: Never true    Ran Out of Food in the Last Year: Never true  Transportation Needs: No Transportation Needs (06/23/2023)   PRAPARE - Administrator, Civil Service (Medical): No  Lack of Transportation (Non-Medical): No  Physical Activity: Sufficiently Active (04/29/2023)   Exercise Vital Sign    Days of Exercise per Week: 4 days    Minutes of Exercise per Session: 40 min  Stress: No Stress Concern Present (04/29/2023)   [employer] of Occupational Health - Occupational Stress Questionnaire    Feeling of Stress : Only a little  Social Connections: Unknown (04/29/2023)   Social Connection and Isolation Panel [NHANES]    Frequency of Communication with Friends and Family: Once a week    Frequency of Social Gatherings with Friends and Family: Once a week    Attends Religious Services: Patient declined    Doctor, general practice or Organizations: No    Attends Banker Meetings: Never    Marital Status: Married  Catering manager Violence: Not At Risk (06/23/2023)   Humiliation, Afraid, Rape, and Kick questionnaire    Fear of Current or Ex-Partner: No    Emotionally Abused: No    Physically Abused: No    Sexually Abused: No    From [place], has done school in [place]. Loves to camp No known TB testing, no hx known exposure.    Allergies  Allergen Reactions   Emend [Fosaprepitant Dimeglumine] Other (See Comments)    Chest tightness with first Emend reaction.  See Progress Note 06/16/23.   Compazine [Prochlorperazine Edisylate] Other (See Comments)    "Neck hyperextends"     Outpatient Medications Prior to Visit  Medication Sig Dispense Refill   acetaminophen (TYLENOL) 325 MG tablet Take 2 tablets (650 mg total) by mouth every 6 (six) hours as needed for mild pain (pain score 1-3) (or Fever >/= 101).     bisacodyl (DULCOLAX) 5 MG EC tablet Take 10-15 mg by mouth daily as needed for moderate constipation.     Iron, Ferrous Sulfate, 325 (65 Fe) MG TABS Take 65 mg by mouth daily with breakfast.     levothyroxine (SYNTHROID) 100 MCG tablet Take 1 tablet (100 mcg total) by mouth daily at 6 (six) AM. 30 tablet 2   ondansetron (ZOFRAN) 8 MG tablet Take 1 tablet (8 mg total) by mouth every 8 (eight) hours as needed for nausea or vomiting. Start after day 3 of chemotherapy if needed 30 tablet 2   oxyCODONE-acetaminophen (PERCOCET/ROXICET) 5-325 MG tablet Take 1 tablet by mouth every 6 (six) hours as needed for severe pain (pain score 7-10). 40 tablet 0   zolpidem (AMBIEN) 10 MG tablet Take 0.5-1 tablets (5-10 mg total) by mouth at bedtime as needed for sleep. 30 tablet 1   ALPRAZolam (XANAX) 0.5 MG tablet Take 0.5 mg by mouth daily as needed for anxiety or sleep.     methylPREDNISolone (MEDROL DOSEPAK) 4 MG TBPK tablet Take 6 pills by mouth day 1, 5 on day 2, 4 on day 3, 3 on day 4, 2 on day 5, 1 on day 6 (Patient  not taking: Reported on 08/19/2023) 21 tablet 0   No facility-administered medications prior to visit.        Objective:   Physical Exam  Vitals:   08/19/23 1439  BP: 114/74  Pulse: 74  SpO2: 99%  Weight: 117 lb (53.1 kg)  Height: 5\' 3"  (1.6 m)    Gen: Pleasant, well-nourished, in no distress,  normal affect  ENT: No lesions,  mouth clear,  oropharynx clear, no postnasal drip, mild stridor  Neck: No JVD, mild stridor  Lungs: No use of accessory muscles, some rhonchi and inspiratory  and expiratory low pitched wheeze on the left  Cardiovascular: RRR, heart sounds normal, no murmur or gallops, no peripheral edema  Musculoskeletal: No deformities, no cyanosis or clubbing  Neuro: alert, awake, non focal  Skin: Warm, no lesions or rash    Assessment & Plan:   Dyspnea She is having exertional dyspnea, fatigue.  Her pulmonary function testing is normal and her most recent CT chest does not show any evidence of pneumonitis, progression of disease beyond her known pulmonary nodules that likely reflect metastatic disease.  I suspect that her symptoms are due to her chemotherapy regimen.  She is trying to work through this, has been challenging given pancytopenia, especially anemia.  She was transfused recently.  We talked today about balancing the pros and cons of her therapy.  She stays in touch with Dr. Arbutus Ped and discusses this with him as well.  She was wondering whether there may be some benefit to other therapy including infusions, possibly iron, possibly vitamin C.  We do have an infusion center here if Dr. Arbutus Ped thinks this would be beneficial.  She asked about incentive spirometry and I will get her an I-S today.   Time spent 31 minutes   Levy Pupa, MD, PhD 08/19/2023, 5:21 PM [facility] [phone] or if no answer before 7:00PM call [phone] For any issues after 7:00PM please call eLink [phone]

## 2023-08-19 NOTE — Progress Notes (Signed)
 [facility] OFFICE PROGRESS NOTE  [name], MD [address] [place] [address]  DIAGNOSIS: 1) Stage IIIB (T3, N2, M0) non-small cell lung cancer, squamous cell carcinoma presented with obstructive left lingular mass with consolidation and collapse of the lingula as well as part of the left upper lobe and likely invasion of the mediastinum diagnosed in February 2024.  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   Biomarker Findings Microsatellite status - MS-Stable Tumor Mutational Burden - 5 Muts/Mb Genomic Findings For a complete list of the genes assayed, please refer to the Appendix. CCND1 amplification FGFR1 amplification MDM2 amplification FGF19 amplification FGF3 amplification FGF4 amplification TP53 Q136* 8 Disease relevant genes with no reportable alterations: ALK, BRAF, EGFR, ERBB2, KRAS, MET, RET, ROS1   PDL1: 5%  PRIOR THERAPY: 1) Concurrent chemoradiation with weekly carboplatin for AUC of 2 and paclitaxel 45 Mg/M2. Status post 7 weeks of treatment.  Last dose was given October 06, 2022 with partial response. 2) Consolidation treatment with immunotherapy with Imfinzi 1500 Mg IV every 4 weeks.  First dose on November 13, 2022.  Status post 2 cycles.  This was discontinued secondary to toxicity and suspicious immunotherapy mediated pneumonitis. 3) Right Kidney and Ureter Nephrectomy under the care of Dr. Sande Brothers. Tumor extensively involves renal pelvis, extends through the kidney into the perinephric fat with lymphovascular invasion and positive margin at the peri soft tissue of the renal vein  CURRENT THERAPY: Carboplatin for AUC of 5 on day 1 and gemcitabine 1000 Mg/M2  on days 1 and 8 IV every 3 weeks. First dose on 06/15/2023.  Status post 3 cycles.  The dose of carboplatin was reduced to an AUC of 4 and gemcitabine 800 mg/m starting from cycle #3 due to cytopenias  INTERVAL  HISTORY: Evelyn Jordan 73 y.o. female returns to the clinic today for follow-up visit accompanied by husband.  The patient is currently undergoing systemic chemotherapy for urothelial carcinoma and lung cancer.  She is status post 3 cycles.  She has been struggling with cytopenias and symptomatic anemia with fatigue and significant shortness of breath.  For her significant shortness of breath, she had an earlier restaging CT scan a few weeks ago which showed some improvement in her malignancy but her scan redemonstrates significant fibrosis, volume loss, and atelectasis.  She saw Dr. Delton Coombes last week on 08/19/2023. She had normal PFTs. He felt from a pulmonary standpoint, there   She also received a blood transfusion in the interval.  She felt better for a few days but then began started feeling poorly again.  There was no evidence of PE on her CT.  She is already on a dose reduce chemotherapy due to cytopenias which was causing dose delays and cancellations and treatment.  She was seen in the symptom management clinic in the interval receiving the blood transfusion.  She is also followed closely by palliative care.   Since receiving a blood transfusion, her tachycardia and shortness of breath with exertion is a little bit better but still her shortness of breath is still present with exertion such as climbing the stairs.  She still continues to have fatigue and weakness.  She denies any visible bleeding except for slight nosebleeding but denies any overt epistaxis.  She is not on any blood thinners.   She also tells me since she had right hip pain since having her nephrectomy.  She states that her surgeon recommended therapy,  gabapentin, and she is followed with palliative care and takes Percocet.  There was no CT correlate of metastatic disease to this area.  Despite her surgery being several months ago, her pain has persisted which she feels when she stands on her feet or tries to walk.   She is  currently taking iron supplement.  She denies any chills or fevers.  Her appetite is "ok". Her weight is stable.  She states she is not coughing very much. Denies any chest pain or hemoptysis.  Denies any nausea or vomiting.  She has some constipation secondary to Percocet use for cancer related pain for which she takes Dulcolax. She sees palliative today. She takes Ambien to help with sleep.  She denies any malodorous urine, hematuria, or dysuria.  She is here today for evaluation repeat blood work before undergoing day 1 cycle 4   MEDICAL HISTORY: Past Medical History:  Diagnosis Date   Depression    Fatty liver    Insomnia    Lung cancer (HCC) 07/25/2022   Osteoporosis    Pericardial effusion 03/05/2023   small noted on ECHO   Pulmonary nodule    Thyroid nodule     ALLERGIES:  is allergic to emend [fosaprepitant dimeglumine] and compazine [prochlorperazine edisylate].  MEDICATIONS:  Current Outpatient Medications  Medication Sig Dispense Refill   acetaminophen (TYLENOL) 325 MG tablet Take 2 tablets (650 mg total) by mouth every 6 (six) hours as needed for mild pain (pain score 1-3) (or Fever >/= 101).     ALPRAZolam (XANAX) 0.5 MG tablet Take 0.5 mg by mouth daily as needed for anxiety or sleep.     bisacodyl (DULCOLAX) 5 MG EC tablet Take 10-15 mg by mouth daily as needed for moderate constipation.     Iron, Ferrous Sulfate, 325 (65 Fe) MG TABS Take 65 mg by mouth daily with breakfast.     levothyroxine (SYNTHROID) 100 MCG tablet Take 1 tablet (100 mcg total) by mouth daily at 6 (six) AM. 30 tablet 2   methylPREDNISolone (MEDROL DOSEPAK) 4 MG TBPK tablet Take 6 pills by mouth day 1, 5 on day 2, 4 on day 3, 3 on day 4, 2 on day 5, 1 on day 6 (Patient not taking: Reported on 08/19/2023) 21 tablet 0   ondansetron (ZOFRAN) 8 MG tablet Take 1 tablet (8 mg total) by mouth every 8 (eight) hours as needed for nausea or vomiting. Start after day 3 of chemotherapy if needed 30 tablet 2    oxyCODONE-acetaminophen (PERCOCET/ROXICET) 5-325 MG tablet Take 1 tablet by mouth every 6 (six) hours as needed for severe pain (pain score 7-10). 40 tablet 0   zolpidem (AMBIEN) 10 MG tablet Take 0.5-1 tablets (5-10 mg total) by mouth at bedtime as needed for sleep. 30 tablet 1   No current facility-administered medications for this visit.    SURGICAL HISTORY:  Past Surgical History:  Procedure Laterality Date   ANKLE SURGERY Right    BREAST BIOPSY Right    benign 2013   BRONCHIAL BIOPSY  08/04/2022   Procedure: BRONCHIAL BIOPSIES;  Surgeon: Leslye Peer, MD;  Location[facility] ENDOSCOPY;  Service: Cardiopulmonary;;   BRONCHIAL BIOPSY  06/22/2023   Procedure: BRONCHIAL BIOPSIES;  Surgeon: Leslye Peer, MD;  Location: [facility] ENDOSCOPY;  Service: Pulmonary;;   BRONCHIAL BRUSHINGS  08/04/2022   Procedure: BRONCHIAL BRUSHINGS;  Surgeon: Leslye Peer, MD;  Location: [facility] ENDOSCOPY;  Service: Cardiopulmonary;;   BRONCHIAL BRUSHINGS  06/22/2023   Procedure: BRONCHIAL BRUSHINGS;  Surgeon: Leslye Peer, MD;  Location: [facility] ENDOSCOPY;  Service: Pulmonary;;   BRONCHIAL NEEDLE ASPIRATION BIOPSY  06/22/2023   Procedure: BRONCHIAL NEEDLE ASPIRATION BIOPSIES;  Surgeon: Leslye Peer, MD;  Location: [facility] ENDOSCOPY;  Service: Pulmonary;;   BRONCHIAL WASHINGS  06/22/2023   Procedure: BRONCHIAL WASHINGS;  Surgeon: Leslye Peer, MD;  Location: [facility] ENDOSCOPY;  Service: Pulmonary;;   COLONOSCOPY     CYSTOSCOPY WITH BIOPSY N/A 05/15/2023   Procedure: CYSTOSCOPY WITH RIGHT RENAL PELVIS BIOPSY;  Surgeon: Rene Paci, MD;  Location: [facility] ORS;  Service: Urology;  Laterality: N/A;   CYSTOSCOPY WITH STENT PLACEMENT Right 05/26/2023   Procedure: CYSTOSCOPY WITH RIGHT STENT PLACEMENT; TUR OF RIGHT URETERAL ORISICE;  Surgeon: Rene Paci, MD;  Location: [facility] ORS;  Service: Urology;  Laterality: Right;   CYSTOSCOPY/RETROGRADE/URETEROSCOPY N/A 05/15/2023   Procedure: CYSTOSCOPY, RIGHT RETROGRADE,  RIGHT URETEROSCOPY, RIGHT STENT PLACEMENT;  Surgeon: Rene Paci, MD;  Location: [facility] ORS;  Service: Urology;  Laterality: N/A;  30 MINUTES   HEMOSTASIS CONTROL  08/04/2022   Procedure: HEMOSTASIS CONTROL;  Surgeon: Leslye Peer, MD;  Location: [facility] ENDOSCOPY;  Service: Cardiopulmonary;;   IR IMAGING GUIDED PORT INSERTION  09/04/2022   repair of a torn meniscus Left 05/2018   ROBOT ASSITED LAPAROSCOPIC NEPHROURETERECTOMY Right 05/26/2023   Procedure: XI ROBOT ASSITED LAPAROSCOPIC NEPHROURETERECTOMY;  Surgeon: Rene Paci, MD;  Location: [facility] ORS;  Service: Urology;  Laterality: Right;  210 MINS FOR CASE   VIDEO BRONCHOSCOPY Left 08/04/2022   Procedure: VIDEO BRONCHOSCOPY WITH FLUORO;  Surgeon: Leslye Peer, MD;  Location: [facility] ENDOSCOPY;  Service: Cardiopulmonary;  Laterality: Left;    REVIEW OF SYSTEMS:   Review of Systems  Constitutional: Positive for fatigue. Negative for appetite change, chills and fever.  HENT: Negative for mouth sores, nosebleeds, sore throat and trouble swallowing.   Eyes: Negative for eye problems and icterus.  Respiratory: Positive for shortness of breath with exertion. Negative for cough, hemoptysis,  and wheezing.   Cardiovascular: Negative for chest pain and leg swelling.  Gastrointestinal: Positive for occasional constipation. Negative for diarrhea, nausea and vomiting.  Genitourinary: Negative for bladder incontinence, difficulty urinating, dysuria, frequency and hematuria.  Musculoskeletal: Positive for right hip pain. Negative for back pain, gait problem, neck pain and neck stiffness.  Skin: Negative for itching and rash.  Neurological: Negative for dizziness, extremity weakness, gait problem, headaches, light-headedness and seizures.  Hematological: Negative for adenopathy. Does not bruise/bleed easily.  Psychiatric/Behavioral: Negative for confusion, depression and sleep disturbance. The patient is not nervous/anxious.      PHYSICAL EXAMINATION:  Blood pressure 119/68, pulse 88, temperature 98 F (36.7 C), temperature source Temporal, resp. rate 14, weight 117 lb 4.8 oz (53.2 kg), SpO2 97%.  ECOG PERFORMANCE STATUS: 1  Physical Exam  Constitutional: Oriented to person, place, and time and thin appearing female, and in no distress.  HENT:  Head: Normocephalic and atraumatic.  Mouth/Throat: Oropharynx is clear and moist. No oropharyngeal exudate.  Eyes: Conjunctivae are normal. Right eye exhibits no discharge. Left eye exhibits no discharge. No scleral icterus.  Neck: Normal range of motion. Neck supple.  Cardiovascular: Normal rate, regular rhythm, normal heart sounds and intact distal pulses.   Pulmonary/Chest: Effort normal and breath sounds normal. No respiratory distress. No wheezes. No rales.  Abdominal: Soft. Bowel sounds are normal. Exhibits no distension and no mass. There is no tenderness.  Musculoskeletal: Normal range of motion. Exhibits no edema.  Lymphadenopathy:    No cervical adenopathy.  Neurological: Alert and oriented to person, place, and time. Exhibits muscle wasting. Gait normal. Coordination normal.  Skin: Skin is warm and dry. No rash noted. Not diaphoretic. No erythema. No pallor.  Psychiatric: Mood, memory and judgment normal.  Vitals reviewed.  LABORATORY DATA: Lab Results  Component Value Date   WBC 5.0 08/24/2023   HGB 9.3 (L) 08/24/2023   HCT 27.9 (L) 08/24/2023   MCV 89.1 08/24/2023   PLT 87 (L) 08/24/2023      Chemistry      Component Value Date/Time   NA 134 (L) 08/24/2023 0847   NA 141 03/07/2015 0000   K 4.2 08/24/2023 0847   CL 100 08/24/2023 0847   CO2 28 08/24/2023 0847   BUN 16 08/24/2023 0847   CREATININE 1.34 (H) 08/24/2023 0847   GLU 103 03/07/2015 0000      Component Value Date/Time   CALCIUM 9.1 08/24/2023 0847   ALKPHOS 78 08/24/2023 0847   AST 17 08/24/2023 0847   ALT 14 08/24/2023 0847   BILITOT 0.4 08/24/2023 0847        RADIOGRAPHIC STUDIES:  DG Chest 2 View Result Date: 08/14/2023 CLINICAL DATA:  Shortness of breath.  History of lung cancer. EXAM: CHEST - 2 VIEW COMPARISON:  June 22, 2023.  July 26, 2023. FINDINGS: Stable cardiomediastinal silhouette. Right internal jugular Port-A-Cath is unchanged. Stable left perihilar density is noted which may represent post treatment change, although underlying neoplasm cannot be excluded. Stable bilateral pulmonary nodules are noted. Bony thorax is unremarkable. IMPRESSION: Stable left perihilar density is noted which may represent post treatment change, although underlying neoplasm cannot be excluded. Stable bilateral pulmonary nodules are noted. No new abnormality seen. Electronically Signed   By: Lupita Raider M.D.   On: 08/14/2023 11:52   CT CHEST ABDOMEN PELVIS W CONTRAST Result Date: 07/26/2023 CLINICAL DATA:  History of metastatic uroepithelial cancer and non-small cell lung cancer. Assess treatment response. * Tracking Code: BO * EXAM: CT CHEST, ABDOMEN, AND PELVIS WITH CONTRAST TECHNIQUE: Multidetector CT imaging of the chest, abdomen and pelvis was performed following the standard protocol during bolus administration of intravenous contrast. RADIATION DOSE REDUCTION: This exam was performed according to the departmental dose-optimization program which includes automated exposure control, adjustment of the mA and/or kV according to patient size and/or use of iterative reconstruction technique. CONTRAST:  75mL OMNIPAQUE IOHEXOL 300 MG/ML  SOLN COMPARISON:  Numerous prior imaging studies. The most recent abdominal/pelvic CT scan is 06/16/2023 and the most recent PET-CT is 06/15/2023. Prior chest CT 05/25/2023 FINDINGS: CT CHEST FINDINGS Cardiovascular: The heart is normal in size. Persistent small pericardial effusion. The aorta is normal in caliber. No dissection. Stable scattered atherosclerotic calcification and stable three-vessel coronary artery  calcifications. The pulmonary arteries are grossly normal. Mediastinum/Nodes: No mediastinal or hilar mass or lymphadenopathy. The esophagus is grossly normal. Lungs/Pleura: The pulmonary nodules have decreased slightly in size when compared to the prior chest CT from 05/25/2023. The medial left upper lobe lesion adjacent to the pulmonary arterial trunk measures approximately 10 x 8 mm on image 26/301. This previously measured 28 x 13 mm. Posterior left upper lobe lesion on image 36/302 measures 8 x 5 mm and previously measured 13 x 9 mm. Right lower lobe nodule on image number 69/302 measures a maximum 7 mm and previously measured 8 mm. The hypermetabolic left lower lobe lung lesion medially on the prior PET-CT measures approximately 12 mm transverse dimension and appears unchanged. Numerous stable calcified granulomas. Stable extensive radiation  changes involving the left long with significant fibrosis, atelectasis and volume loss in the left hemithorax. Musculoskeletal: No significant bony findings. CT ABDOMEN PELVIS FINDINGS Hepatobiliary: No hepatic lesions or intrahepatic biliary dilatation. The portal and hepatic veins are patent. The gallbladder is unremarkable. No common bile duct dilatation. Pancreas: No mass, inflammation or ductal dilatation. Spleen: Normal in size without focal abnormality. Adrenals/Urinary Tract: The left adrenal gland is normal. Subtle area of persistent hypoperfusion involving the interpolar region of the left kidney posteriorly. This is smaller and has straight margins and is likely an area of resolving focal pyelonephritis. No collecting system abnormalities. The bladder is unremarkable. The right kidney is surgically absent. Stomach/Bowel: Stomach is within normal limits. No evidence of bowel wall thickening, distention, or inflammatory changes. Vascular/Lymphatic: Stable aortic and branch vessel calcifications. No aneurysm or dissection. The right-sided retroperitoneal nodal mass  seen on the prior CT scan and PET-CT demonstrates interval improvement. This measures approximately 3.5 x 2.4 cm and previously measured 3.9 x 3.6 cm. The IVC below the renal veins appears involved and occluded. The left-sided hypermetabolic mesenteric lesion previously measured 2.2 x 1.9 cm and now measures 1.4 x 1.1 cm. Reproductive: The uterus and ovaries are unremarkable. Other: No ascites or abdominal wall hernia. No subcutaneous lesions. Musculoskeletal: The metastatic bone lesions appear stable. Stable lytic destructive lesion involving the T12 spinous process, the right aspect of L3 and the left sacrum. IMPRESSION: 1. Interval decrease in size of the pulmonary nodules, right-sided retroperitoneal nodal mass and left-sided mesenteric mass consistent with a positive response to treatment. 2. Stable metastatic bone lesions.  No new or progressive findings. 3. Stable radiation changes involving the left lung and associated volume loss in the left hemithorax. 4. Stable pericardial effusion. 5. Subtle area of persistent hypoperfusion involving the interpolar region of the left kidney posteriorly. This is smaller and has straight margins and is likely an area of resolving focal pyelonephritis. Electronically Signed   By: Rudie Meyer M.D.   On: 07/26/2023 14:19     ASSESSMENT/PLAN:  This is a very pleasant 73 year old Caucasian female with: 1) initially diagnosed with stage IIIb (T3, N2, M0) non-small cell lung cancer, squamous cell carcinoma.  The patient presented with an obstructive left lingular mass with consolidation and collapse of the lingula as well as part of the left upper lobe and likely invasion of the mediastinum.  She was diagnosed in February 2024.  2) High Grade Urothelial Carcinoma with predominant squamous features (T4) pending further imaging studies. She presented with a 6.1 x 5.4 cm hypoenhancement of the posterior mid/lower right kidney with associated necrotic retrocaval lymph node.  Tumor with predominant squamous differentiation diffusely involves renal pelvis and extend through kidney into perirenal fat and renal sinus fat. Large vein invasion is identified. The renal vein soft tissue margin is positive for carcinoma. Diagnosed in December 2024.    Her molecular studies by foundation 1 showed no actionable mutations and her PD-L1 expression is 5%.    She completed a course of concurrent chemoradiation with carboplatin for AUC of 2 and paclitaxel 45 mg/m.  She status post Avan cycles.  Her last dose was given on 10/06/2022 with a partial response.   She underwent 2 cycles of consolidation immunotherapy with Imfinzi 1500 mg IV every 4 weeks. She was found to have pneumonitis and this was discontinued. She completed high dose steroids.    She had CT of the AP performed in November 2024 showing 6.1 x 5.4 cm hypoenhancement of the  posterior mid/lower right kidney with associated necrotic retrocaval lymph node, concerning for urothelial neoplasm.    She underwent open radical nephroureterectomy on 05/26/2023 by Dr. Sande Brothers. She is recovering well. However, her surgical specimen shows high grade urothelial carcinoma with squamous differentiation. Tumor with predominant squamous differentiation diffusely involves renal pelvis and extend through kidney into perirenal fat and renal sinus fat. Large vein invasion is identified. The renal vein soft tissue margin is positive for carcinoma.    She is currently on treatment with carboplatin AUC 5 and gemcitabine 1,000 mg/m2. She has had some cytopenias which requires some cancellations in treatment.  Therefore, starting from cycle #3 her dose was reduced to carboplatin for an AUC of 4 and gemcitabine 800 mg/m.  Patient has been struggling with shortness of breath.  She saw Dr. Delton Coombes last week who felt this was secondary to her cancer treatment/anemia.  Labs were reviewed.  Dates add some iron, ferritin, B12, and folate to her labs today as  the patient inquired whether this could be contributing to her anemia.  Although, I did explain that she does have pancytopenia and is likely secondary to her chemotherapy.  The patient has standing orders for sample of blood bank should she require a blood transfusion.  We will arrange for blood transfusion should her hemoglobin be less than 8.  Patient was seen with Dr. Arbutus Ped today.  Dr. Arbutus Ped discussed cancer treatment challenges due to cytopenias and sensitivities.  We discussed concerns with quality of life with her treatment.  The patient also is leaning towards not pursuing any treatment but she would like to check the status of her malignancy to help her make a decision.  Therefore we will arrange for restaging CT of the chest, abdomen, and pelvis and see her back in 2 weeks to allow time for her to get her scan performed and have the results.  At that time we will make a decision about treatment versus pursuing palliative care.  We will see her back for follow-up visit in 2 weeks for evaluation and repeat blood work    Her shortness of breath may be due to her significant fibrosis, volume loss, anemia, and atelectasis.   Pancytopenia Pancytopenia likely due to chemotherapy. Platelet count and hemoglobin levels are improving but remain suboptimal. She experiences weakness and occasional epistaxis. - Consider blood transfusion if hemoglobin drops further. - Discuss with palliative care for supportive management.   Right groin pain post-nephrectomy Persistent right groin pain post-nephrectomy, unresponsive to current treatments. CT and PET scans show no abnormalities. Pain affects quality of life and mobility. - Order MRI of the hip to evaluate muscles and ligaments. - Consult with palliative care for pain management.  Goals of Care She is considering discontinuing cancer treatment due to quality of life impact. Prefers quality of life over aggressive treatment. -Engage in shared  decision-making regarding continuation of treatment.   The patient was advised to call immediately if she has any concerning symptoms in the interval. The patient voices understanding of current disease status and treatment options and is in agreement with the current care plan. All questions were answered. The patient knows to call the clinic with any problems, questions or concerns. We can certainly see the patient much sooner if necessary     Orders Placed This Encounter  Procedures   CT CHEST ABDOMEN PELVIS W CONTRAST    Please administer the lowest amount of contrast    Standing Status:   Future    Expected  Date:   08/28/2023    Expiration Date:   08/23/2024    If indicated for the ordered procedure, I authorize the administration of contrast media per Radiology protocol:   Yes    Does the patient have a contrast media/X-ray dye allergy?:   No    Preferred imaging location?:   [facility]    If indicated for the ordered procedure, I authorize the administration of oral contrast media per Radiology protocol:   Yes   MR HIP RIGHT W WO CONTRAST    Standing Status:   Future    Expected Date:   08/28/2023    Expiration Date:   08/23/2024    Scheduling Instructions:     I did not order as stat but I would like this scan performed in next week or so    If indicated for the ordered procedure, I authorize the administration of contrast media per Radiology protocol:   Yes    What is the patient's sedation requirement?:   No Sedation    Does the patient have a pacemaker or implanted devices?:   No    Preferred imaging location?:   [facility] (table limit - 550 lbs)   CBC with Differential (Cancer Center Only)    Standing Status:   Future    Expected Date:   09/07/2023    Expiration Date:   09/06/2024   CMP (Cancer Center only)    Standing Status:   Future    Expected Date:   09/07/2023    Expiration Date:   09/06/2024      Evelyn Abraham Althia Egolf,  PA-C 08/24/23  ADDENDUM: Hematology/Oncology Attending: I had a face-to-face encounter with the patient today.  I reviewed her records, lab and recommended her care plan.  The patient was accompanied by her husband today.  She has a history of a stage IIIb non-small cell lung cancer, squamous cell carcinoma diagnosed in February 2024 in addition to metastatic urothelial carcinoma with a squamous cell component diagnosed in December 2024.  The patient is status post treatment for non-small cell lung cancer with a course of concurrent chemoradiation followed by 2 cycles of consolidation treatment with immunotherapy with Imfinzi discontinued secondary to toxicity and suspicious pneumonitis.  With the patient was diagnosed with the metastatic urothelial carcinoma she underwent right ureteronephrectomy and currently undergoing systemic chemotherapy with reduced dose carboplatin and gemcitabine.  She is status post 3 cycles.  She missed day 8 of cycle 2 and 3 secondary to significant pancytopenia requiring transfusion support. The patient is here today for reevaluation for restarting day 1 of cycle #4. She continues to have low platelet count and she does not meet the parameters for treatment today. I had a lengthy discussion with the patient and her husband about her current condition and treatment options.  I explained to the patient that she has an aggressive neoplasm of the urinary bladder and she has poor intolerance to the systemic chemotherapy with frequent delays and significant pancytopenia.  I discussed with the patient again the option of continuing her treatment with the reduced dose but delaying the start of cycle number 4 x 1 week versus consideration of palliative care and hospice.  The patient is interested in the palliative care option but she would like to have repeat CT scan of the chest for evaluation of her disease before making the final decision.  She also has been on the right hip area and  we will order MRI of the  right hip to rule out any other metastatic disease in this area. We will see her back for follow-up visit in 2 weeks for evaluation and more detailed discussion of her treatment options based on the imaging studies. The patient was advised to call immediately if she has any other concerning symptoms in the interval. The total time spent in the appointment was 30 minutes. Disclaimer: This note was dictated with voice recognition software. Similar sounding words can inadvertently be transcribed and may be missed upon review. Lajuana Matte, MD

## 2023-08-19 NOTE — Assessment & Plan Note (Signed)
 She is having exertional dyspnea, fatigue.  Her pulmonary function testing is normal and her most recent CT chest does not show any evidence of pneumonitis, progression of disease beyond her known pulmonary nodules that likely reflect metastatic disease.  I suspect that her symptoms are due to her chemotherapy regimen.  She is trying to work through this, has been challenging given pancytopenia, especially anemia.  She was transfused recently.  We talked today about balancing the pros and cons of her therapy.  She stays in touch with Dr. Arbutus Ped and discusses this with him as well.  She was wondering whether there may be some benefit to other therapy including infusions, possibly iron, possibly vitamin C.  We do have an infusion center here if Dr. Arbutus Ped thinks this would be beneficial.  She asked about incentive spirometry and I will get her an I-S today.

## 2023-08-19 NOTE — Patient Instructions (Signed)
 We reviewed your breathing and your blood counts today. Continue to follow Dr. Arbutus Ped to plan and discussed your chemotherapy regimen. We talked today about the potential benefits of infused IV iron, vitamin C.  Would discuss this with oncology as well. We will get you an incentive spirometer. Follow Dr. Delton Coombes as needed for any respiratory issues.

## 2023-08-19 NOTE — Progress Notes (Signed)
 Spoke with Evelyn Jordan in the blood bank and confirmed prepare for platelets and blood for tomorrow.

## 2023-08-19 NOTE — Telephone Encounter (Signed)
 CRITICAL VALUE STICKER  CRITICAL VALUE: WBC= 0.8  RECEIVER (on-site recipient of call): M. York, RN  DATE & TIME NOTIFIED: 08/19/23 1400  MESSENGER (representative from lab):Pam  MD NOTIFIED: C. Heilingoetter, PA  TIME OF NOTIFICATION: 08/19/23 1405  RESPONSE: orders to be carried out by her nurse

## 2023-08-20 ENCOUNTER — Inpatient Hospital Stay

## 2023-08-20 VITALS — BP 110/66 | HR 73 | Temp 98.3°F | Resp 18 | Ht 63.0 in | Wt 118.0 lb

## 2023-08-20 DIAGNOSIS — Z5189 Encounter for other specified aftercare: Secondary | ICD-10-CM | POA: Diagnosis not present

## 2023-08-20 DIAGNOSIS — Z5111 Encounter for antineoplastic chemotherapy: Secondary | ICD-10-CM | POA: Diagnosis not present

## 2023-08-20 DIAGNOSIS — C679 Malignant neoplasm of bladder, unspecified: Secondary | ICD-10-CM | POA: Diagnosis not present

## 2023-08-20 DIAGNOSIS — D696 Thrombocytopenia, unspecified: Secondary | ICD-10-CM

## 2023-08-20 DIAGNOSIS — C3412 Malignant neoplasm of upper lobe, left bronchus or lung: Secondary | ICD-10-CM | POA: Diagnosis not present

## 2023-08-20 DIAGNOSIS — Z95828 Presence of other vascular implants and grafts: Secondary | ICD-10-CM

## 2023-08-20 DIAGNOSIS — J9811 Atelectasis: Secondary | ICD-10-CM | POA: Diagnosis not present

## 2023-08-20 DIAGNOSIS — C7951 Secondary malignant neoplasm of bone: Secondary | ICD-10-CM | POA: Diagnosis not present

## 2023-08-20 DIAGNOSIS — D6481 Anemia due to antineoplastic chemotherapy: Secondary | ICD-10-CM

## 2023-08-20 MED ORDER — ACETAMINOPHEN 325 MG PO TABS
650.0000 mg | ORAL_TABLET | Freq: Once | ORAL | Status: AC
  Administered 2023-08-20: 650 mg via ORAL
  Filled 2023-08-20: qty 2

## 2023-08-20 MED ORDER — SODIUM CHLORIDE 0.9% FLUSH
10.0000 mL | INTRAVENOUS | Status: AC | PRN
Start: 2023-08-20 — End: 2023-08-20
  Administered 2023-08-20: 10 mL

## 2023-08-20 MED ORDER — DIPHENHYDRAMINE HCL 25 MG PO CAPS
25.0000 mg | ORAL_CAPSULE | Freq: Once | ORAL | Status: AC
  Administered 2023-08-20: 25 mg via ORAL
  Filled 2023-08-20: qty 1

## 2023-08-20 MED ORDER — HEPARIN SOD (PORK) LOCK FLUSH 100 UNIT/ML IV SOLN
500.0000 [IU] | Freq: Every day | INTRAVENOUS | Status: AC | PRN
  Administered 2023-08-20: 500 [IU]

## 2023-08-20 MED ORDER — SODIUM CHLORIDE 0.9% IV SOLUTION
250.0000 mL | Freq: Once | INTRAVENOUS | Status: AC
  Administered 2023-08-20: 100 mL via INTRAVENOUS

## 2023-08-20 MED ORDER — FILGRASTIM-SNDZ 300 MCG/0.5ML IJ SOSY
300.0000 ug | PREFILLED_SYRINGE | Freq: Once | INTRAMUSCULAR | Status: AC
  Administered 2023-08-20: 300 ug via SUBCUTANEOUS
  Filled 2023-08-20: qty 0.5

## 2023-08-21 ENCOUNTER — Inpatient Hospital Stay

## 2023-08-21 VITALS — BP 108/65 | HR 98 | Temp 98.2°F | Resp 16

## 2023-08-21 DIAGNOSIS — Z5189 Encounter for other specified aftercare: Secondary | ICD-10-CM | POA: Diagnosis not present

## 2023-08-21 DIAGNOSIS — J9811 Atelectasis: Secondary | ICD-10-CM | POA: Diagnosis not present

## 2023-08-21 DIAGNOSIS — Z5111 Encounter for antineoplastic chemotherapy: Secondary | ICD-10-CM | POA: Diagnosis not present

## 2023-08-21 DIAGNOSIS — C679 Malignant neoplasm of bladder, unspecified: Secondary | ICD-10-CM | POA: Diagnosis not present

## 2023-08-21 DIAGNOSIS — C7951 Secondary malignant neoplasm of bone: Secondary | ICD-10-CM | POA: Diagnosis not present

## 2023-08-21 DIAGNOSIS — C3412 Malignant neoplasm of upper lobe, left bronchus or lung: Secondary | ICD-10-CM | POA: Diagnosis not present

## 2023-08-21 DIAGNOSIS — Z95828 Presence of other vascular implants and grafts: Secondary | ICD-10-CM

## 2023-08-21 LAB — TYPE AND SCREEN
ABO/RH(D): A POS
Antibody Screen: NEGATIVE
Unit division: 0

## 2023-08-21 LAB — PREPARE PLATELET PHERESIS: Unit division: 0

## 2023-08-21 LAB — BPAM PLATELET PHERESIS
Blood Product Expiration Date: 202503142359
ISSUE DATE / TIME: 202503130847
Unit Type and Rh: 202503142359
Unit Type and Rh: 5100

## 2023-08-21 LAB — BPAM RBC
Blood Product Expiration Date: 202504122359
ISSUE DATE / TIME: 202503131000
Unit Type and Rh: 202504122359
Unit Type and Rh: 6200

## 2023-08-21 MED ORDER — FILGRASTIM-SNDZ 300 MCG/0.5ML IJ SOSY
300.0000 ug | PREFILLED_SYRINGE | Freq: Once | INTRAMUSCULAR | Status: AC
  Administered 2023-08-21: 300 ug via SUBCUTANEOUS
  Filled 2023-08-21: qty 0.5

## 2023-08-21 MED ORDER — FILGRASTIM-SNDZ 300 MCG/0.5ML IJ SOSY: 300.0000 ug | PREFILLED_SYRINGE | Freq: Every day | INTRAMUSCULAR | Status: DC

## 2023-08-21 NOTE — Progress Notes (Signed)
 Palliative Medicine [facility]  [phone] [phone]   Name: Evelyn Jordan Date: 08/21/2023 MRN: [id number]  DOB: 05/29/1951  Patient Care Team: [name], MD as PCP - General (Family Medicine) August Saucer, Corrie Mckusick, MD as Consulting Physician (Orthopedic Surgery) Canary Brim, OD as Consulting Physician (Ophthalmology) Delton Coombes Les Pou, MD as Consulting Physician (Pulmonary Disease) Si Gaul, MD as Consulting Physician (Oncology) Pickenpack-Cousar, Arty Baumgartner, NP as Nurse Practitioner (Hospice and Palliative Medicine)    INTERVAL HISTORY: Evelyn Jordan is a 73 y.o. 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 Palliative ask to see for symptom management and goals of care.   SOCIAL HISTORY:     reports that she quit smoking about 13 years ago. Her smoking use included cigarettes. She started smoking about 54 years ago. She has a 41 pack-year smoking history. She has never used smokeless tobacco. She reports current alcohol use. She reports that she does not use drugs.  ADVANCE DIRECTIVES:  Advanced directives on file naming Evelyn Jordan and Evelyn Jordan as the primary and secondary decision makers should the patient be unable to make her own decisions.   CODE STATUS: Full code  PAST MEDICAL HISTORY: Past Medical History:  Diagnosis Date   Depression    Fatty liver    Insomnia    Lung cancer (HCC) 07/25/2022   Osteoporosis    Pericardial effusion 03/05/2023   small noted on ECHO   Pulmonary nodule    Thyroid nodule     ALLERGIES:  is allergic to emend [fosaprepitant dimeglumine] and compazine [prochlorperazine edisylate].  MEDICATIONS:  Current Outpatient Medications  Medication Sig Dispense Refill   acetaminophen (TYLENOL) 325 MG tablet Take 2 tablets (650 mg total) by mouth every 6 (six) hours as needed for mild pain (pain score  1-3) (or Fever >/= 101).     ALPRAZolam (XANAX) 0.5 MG tablet Take 0.5 mg by mouth daily as needed for anxiety or sleep.     bisacodyl (DULCOLAX) 5 MG EC tablet Take 10-15 mg by mouth daily as needed for moderate constipation.     Iron, Ferrous Sulfate, 325 (65 Fe) MG TABS Take 65 mg by mouth daily with breakfast.     levothyroxine (SYNTHROID) 100 MCG tablet Take 1 tablet (100 mcg total) by mouth daily at 6 (six) AM. 30 tablet 2   methylPREDNISolone (MEDROL DOSEPAK) 4 MG TBPK tablet Take 6 pills by mouth day 1, 5 on day 2, 4 on day 3, 3 on day 4, 2 on day 5, 1 on day 6 (Patient not taking: Reported on 08/19/2023) 21 tablet 0   ondansetron (ZOFRAN) 8 MG tablet Take 1 tablet (8 mg total) by mouth every 8 (eight) hours as needed for nausea or vomiting. Start after day 3 of chemotherapy if needed 30 tablet 2   oxyCODONE-acetaminophen (PERCOCET/ROXICET) 5-325 MG tablet Take 1 tablet by mouth every 6 (six) hours as needed for severe pain (pain score 7-10). 40 tablet 0   zolpidem (AMBIEN) 10 MG tablet Take 0.5-1 tablets (5-10 mg total) by mouth at bedtime as needed for sleep. 30 tablet 1   No current facility-administered medications for this visit.    VITAL SIGNS: There were no vitals taken for this visit. There were no vitals filed for this visit.  Estimated body mass index is 20.9 kg/m as calculated from the following:   Height as of 08/20/23: 5\' 3"  (1.6  m).   Weight as of 08/20/23: 118 lb (53.5 kg).   PERFORMANCE STATUS (ECOG) : 1 - Symptomatic but completely ambulatory   Physical Exam General: Fatigued Cardiovascular: regular rate and rhythm Pulmonary: normal breathing pattern  Abdomen: soft, nontender, + bowel sounds Extremities: no edema, no joint deformities, right groin tenderness  Skin: no rashes Neurological: AAO x3  Discussed the use of AI scribe software for clinical note transcription with the patient, who gave verbal consent to proceed.   IMPRESSION:  Evelyn Jordan  is a 73 year old female who presents to clinic today for symptom management follow-up. She is complaining of persistent right groin pain following nephrectomy. Denies concerns with nausea, vomiting, or diarrhea. Constipation controlled with daily stool softeners. Ongoing fatigue which has improved since recent blood transfusion. She expresses concerns for her ongoing anemia challenges. Appetite is good. Some days better than others.   We discussed her right sided pain at length. She experiences persistent right groin pain that began after her nephrectomy. The pain is described as a severe cramp and stabbing sensation, likened to someone digging a thumb into her thigh. It radiates to the top of her leg and sometimes extends toward the perineal area. The pain is constant, worsens with activity, and disrupts her sleep. She has attempted physical therapy and used lidocaine patches without relief. Percocet provides some relief at night, and she takes it as needed. She also uses Tylenol and occasionally ibuprofen, though she avoids ibuprofen due to kidney health concerns. Gabapentin was tried previously but not for a sufficient duration to assess its effectiveness, and she is open to retrying it with a proper dosing schedule.  Education provided on current regimen. Instructed Evelyn Jordan to continue use of Percocet as needed. Per Cassie, PA plans are for MRI of right hip area to rule out any abnormalities. Discussed with patient to restart gabapentin 300mg  twice daily for better pain control. She agrees to allow 2 weeks for effectiveness. Understands we will titrate up to 2 capsules at bedtime if she tolerates dose after one week.   We will continue to closely monitor and adjust regimen as needed. All questions answered.  I discussed the importance of continued conversation with family and their medical providers regarding overall plan of care and treatment options, ensuring decisions are within the context of the  patients values and GOCs.  Assessment and Plan  Right groin pain post-nephrectomy Persistent stabbing, cramp-like pain radiating to leg and perineal area, exacerbated by activity. Previous treatments ineffective. MRI planned to investigate causes per Cassie, PA. Gabapentin retrial scheduled. - Order MRI of the right groin area per Oncology. - Renew Percocet prescription. - Restart gabapentin at 300 mg twice daily for two weeks, consider increasing to 300 mg in the morning and 600 mg at night if needed.  Anemia Anemia suspected, awaiting iron test results to determine need for iron infusion as managed by oncology. - Review iron test results. - Consider iron infusion if levels are low.  Hyponatremia Mild hyponatremia with sodium at 134 mEq/L, asymptomatic. -Will continue to closely monitor.  I will plan to see patient back in 2-3 weeks. Sooner if needed.  Patient expressed understanding and was in agreement with this plan. She also understands that She can call the clinic at any time with any questions, concerns, or complaints.   Any controlled substances utilized were prescribed in the context of palliative care. PDMP has been reviewed.   Visit consisted of counseling and education dealing with the complex  and emotionally intense issues of symptom management and palliative care in the setting of serious and potentially life-threatening illness.  Willette Alma, AGPCNP-BC  Palliative Medicine Team/[facility]

## 2023-08-22 ENCOUNTER — Inpatient Hospital Stay

## 2023-08-22 VITALS — BP 140/83 | HR 91 | Temp 97.5°F | Resp 17

## 2023-08-22 DIAGNOSIS — C7951 Secondary malignant neoplasm of bone: Secondary | ICD-10-CM | POA: Diagnosis not present

## 2023-08-22 DIAGNOSIS — Z95828 Presence of other vascular implants and grafts: Secondary | ICD-10-CM

## 2023-08-22 DIAGNOSIS — C679 Malignant neoplasm of bladder, unspecified: Secondary | ICD-10-CM | POA: Diagnosis not present

## 2023-08-22 DIAGNOSIS — C3412 Malignant neoplasm of upper lobe, left bronchus or lung: Secondary | ICD-10-CM | POA: Diagnosis not present

## 2023-08-22 DIAGNOSIS — Z5111 Encounter for antineoplastic chemotherapy: Secondary | ICD-10-CM | POA: Diagnosis not present

## 2023-08-22 DIAGNOSIS — J9811 Atelectasis: Secondary | ICD-10-CM | POA: Diagnosis not present

## 2023-08-22 DIAGNOSIS — Z5189 Encounter for other specified aftercare: Secondary | ICD-10-CM | POA: Diagnosis not present

## 2023-08-22 MED ORDER — FILGRASTIM-SNDZ 300 MCG/0.5ML IJ SOSY
300.0000 ug | PREFILLED_SYRINGE | Freq: Every day | INTRAMUSCULAR | Status: DC
  Administered 2023-08-22: 300 ug via SUBCUTANEOUS

## 2023-08-22 MED ORDER — FILGRASTIM-SNDZ 300 MCG/0.5ML IJ SOSY
300.0000 ug | PREFILLED_SYRINGE | Freq: Once | INTRAMUSCULAR | Status: DC
  Filled 2023-08-22: qty 0.5

## 2023-08-24 ENCOUNTER — Encounter: Payer: Self-pay | Admitting: Internal Medicine

## 2023-08-24 ENCOUNTER — Other Ambulatory Visit: Payer: Medicare Other

## 2023-08-24 ENCOUNTER — Encounter: Payer: Self-pay | Admitting: Physician Assistant

## 2023-08-24 ENCOUNTER — Ambulatory Visit: Payer: Medicare Other

## 2023-08-24 ENCOUNTER — Other Ambulatory Visit: Payer: Self-pay | Admitting: Physician Assistant

## 2023-08-24 ENCOUNTER — Inpatient Hospital Stay: Payer: Medicare Other

## 2023-08-24 ENCOUNTER — Inpatient Hospital Stay (HOSPITAL_BASED_OUTPATIENT_CLINIC_OR_DEPARTMENT_OTHER): Payer: Medicare Other | Admitting: Physician Assistant

## 2023-08-24 ENCOUNTER — Encounter: Payer: Self-pay | Admitting: Nurse Practitioner

## 2023-08-24 ENCOUNTER — Ambulatory Visit: Payer: Medicare Other | Admitting: Internal Medicine

## 2023-08-24 ENCOUNTER — Inpatient Hospital Stay (HOSPITAL_BASED_OUTPATIENT_CLINIC_OR_DEPARTMENT_OTHER): Payer: Medicare Other | Admitting: Nurse Practitioner

## 2023-08-24 VITALS — BP 119/68 | HR 88 | Temp 98.0°F | Resp 14 | Wt 117.3 lb

## 2023-08-24 DIAGNOSIS — R530 Neoplastic (malignant) related fatigue: Secondary | ICD-10-CM | POA: Diagnosis not present

## 2023-08-24 DIAGNOSIS — C7951 Secondary malignant neoplasm of bone: Secondary | ICD-10-CM | POA: Diagnosis not present

## 2023-08-24 DIAGNOSIS — M25551 Pain in right hip: Secondary | ICD-10-CM

## 2023-08-24 DIAGNOSIS — D61818 Other pancytopenia: Secondary | ICD-10-CM | POA: Insufficient documentation

## 2023-08-24 DIAGNOSIS — C7800 Secondary malignant neoplasm of unspecified lung: Secondary | ICD-10-CM | POA: Diagnosis not present

## 2023-08-24 DIAGNOSIS — J9811 Atelectasis: Secondary | ICD-10-CM | POA: Diagnosis not present

## 2023-08-24 DIAGNOSIS — C3412 Malignant neoplasm of upper lobe, left bronchus or lung: Secondary | ICD-10-CM | POA: Diagnosis not present

## 2023-08-24 DIAGNOSIS — Z95828 Presence of other vascular implants and grafts: Secondary | ICD-10-CM

## 2023-08-24 DIAGNOSIS — Z515 Encounter for palliative care: Secondary | ICD-10-CM | POA: Diagnosis not present

## 2023-08-24 DIAGNOSIS — Z7189 Other specified counseling: Secondary | ICD-10-CM | POA: Diagnosis not present

## 2023-08-24 DIAGNOSIS — M792 Neuralgia and neuritis, unspecified: Secondary | ICD-10-CM | POA: Diagnosis not present

## 2023-08-24 DIAGNOSIS — C679 Malignant neoplasm of bladder, unspecified: Secondary | ICD-10-CM

## 2023-08-24 DIAGNOSIS — G893 Neoplasm related pain (acute) (chronic): Secondary | ICD-10-CM

## 2023-08-24 DIAGNOSIS — C3490 Malignant neoplasm of unspecified part of unspecified bronchus or lung: Secondary | ICD-10-CM | POA: Diagnosis not present

## 2023-08-24 DIAGNOSIS — D6481 Anemia due to antineoplastic chemotherapy: Secondary | ICD-10-CM

## 2023-08-24 DIAGNOSIS — C689 Malignant neoplasm of urinary organ, unspecified: Secondary | ICD-10-CM

## 2023-08-24 DIAGNOSIS — Z5189 Encounter for other specified aftercare: Secondary | ICD-10-CM | POA: Diagnosis not present

## 2023-08-24 DIAGNOSIS — Z5111 Encounter for antineoplastic chemotherapy: Secondary | ICD-10-CM | POA: Diagnosis not present

## 2023-08-24 LAB — CBC WITH DIFFERENTIAL (CANCER CENTER ONLY)
Abs Immature Granulocytes: 0.38 10*3/uL — ABNORMAL HIGH (ref 0.00–0.07)
Basophils Absolute: 0 10*3/uL (ref 0.0–0.1)
Basophils Relative: 0 %
Eosinophils Absolute: 0.1 10*3/uL (ref 0.0–0.5)
Eosinophils Relative: 1 %
HCT: 27.9 % — ABNORMAL LOW (ref 36.0–46.0)
Hemoglobin: 9.3 g/dL — ABNORMAL LOW (ref 12.0–15.0)
Immature Granulocytes: 8 %
Lymphocytes Relative: 2 %
Lymphs Abs: 0.1 10*3/uL — ABNORMAL LOW (ref 0.7–4.0)
MCH: 29.7 pg (ref 26.0–34.0)
MCHC: 33.3 g/dL (ref 30.0–36.0)
MCV: 89.1 fL (ref 80.0–100.0)
Monocytes Absolute: 1.5 10*3/uL — ABNORMAL HIGH (ref 0.1–1.0)
Monocytes Relative: 31 %
Neutro Abs: 2.9 10*3/uL (ref 1.7–7.7)
Neutrophils Relative %: 58 %
Platelet Count: 87 10*3/uL — ABNORMAL LOW (ref 150–400)
RBC: 3.13 MIL/uL — ABNORMAL LOW (ref 3.87–5.11)
RDW: 17.4 % — ABNORMAL HIGH (ref 11.5–15.5)
Smear Review: NORMAL
WBC Count: 5 10*3/uL (ref 4.0–10.5)
nRBC: 1 % — ABNORMAL HIGH (ref 0.0–0.2)

## 2023-08-24 LAB — IRON AND IRON BINDING CAPACITY (CC-WL,HP ONLY)
Iron: 107 ug/dL (ref 28–170)
Saturation Ratios: 38 % — ABNORMAL HIGH (ref 10.4–31.8)
TIBC: 284 ug/dL (ref 250–450)
UIBC: 177 ug/dL (ref 148–442)

## 2023-08-24 LAB — CMP (CANCER CENTER ONLY)
ALT: 14 U/L (ref 0–44)
AST: 17 U/L (ref 15–41)
Albumin: 3.7 g/dL (ref 3.5–5.0)
Alkaline Phosphatase: 78 U/L (ref 38–126)
Anion gap: 6 (ref 5–15)
BUN: 16 mg/dL (ref 8–23)
CO2: 28 mmol/L (ref 22–32)
Calcium: 9.1 mg/dL (ref 8.9–10.3)
Chloride: 100 mmol/L (ref 98–111)
Creatinine: 1.34 mg/dL — ABNORMAL HIGH (ref 0.44–1.00)
GFR, Estimated: 42 mL/min — ABNORMAL LOW (ref >60)
Glucose, Bld: 103 mg/dL — ABNORMAL HIGH (ref 70–99)
Potassium: 4.2 mmol/L (ref 3.5–5.1)
Sodium: 134 mmol/L — ABNORMAL LOW (ref 135–145)
Total Bilirubin: 0.4 mg/dL (ref 0.0–1.2)
Total Protein: 6.9 g/dL (ref 6.5–8.1)

## 2023-08-24 LAB — SAMPLE TO BLOOD BANK

## 2023-08-24 LAB — FOLATE: Folate: 21 ng/mL (ref >5.9)

## 2023-08-24 LAB — FERRITIN: Ferritin: 430 ng/mL — ABNORMAL HIGH (ref 11–307)

## 2023-08-24 LAB — VITAMIN B12: Vitamin B-12: 1000 pg/mL — ABNORMAL HIGH (ref 180–914)

## 2023-08-24 MED ORDER — OXYCODONE-ACETAMINOPHEN 5-325 MG PO TABS: 1.0000 | ORAL_TABLET | Freq: Four times a day (QID) | ORAL | 0 refills | Status: DC | PRN

## 2023-08-24 MED ORDER — SODIUM CHLORIDE 0.9% FLUSH
10.0000 mL | Freq: Once | INTRAVENOUS | Status: AC
  Administered 2023-08-24: 10 mL via INTRAVENOUS

## 2023-08-24 MED ORDER — HEPARIN SOD (PORK) LOCK FLUSH 100 UNIT/ML IV SOLN
500.0000 [IU] | Freq: Once | INTRAVENOUS | Status: AC
  Administered 2023-08-24: 500 [IU] via INTRAVENOUS

## 2023-08-24 MED ORDER — SODIUM CHLORIDE 0.9% FLUSH
10.0000 mL | Freq: Once | INTRAVENOUS | Status: AC
  Administered 2023-08-24: 10 mL

## 2023-08-24 MED ORDER — GABAPENTIN 300 MG PO CAPS: ORAL_CAPSULE | ORAL | 0 refills | Status: DC

## 2023-08-26 ENCOUNTER — Other Ambulatory Visit: Payer: Self-pay

## 2023-08-26 DIAGNOSIS — G893 Neoplasm related pain (acute) (chronic): Secondary | ICD-10-CM

## 2023-08-26 DIAGNOSIS — M792 Neuralgia and neuritis, unspecified: Secondary | ICD-10-CM

## 2023-08-26 DIAGNOSIS — Z515 Encounter for palliative care: Secondary | ICD-10-CM

## 2023-08-26 NOTE — Telephone Encounter (Signed)
 error

## 2023-08-27 ENCOUNTER — Encounter: Payer: Self-pay | Admitting: Family Medicine

## 2023-08-27 ENCOUNTER — Other Ambulatory Visit: Payer: Self-pay | Admitting: Physician Assistant

## 2023-08-27 DIAGNOSIS — C3490 Malignant neoplasm of unspecified part of unspecified bronchus or lung: Secondary | ICD-10-CM

## 2023-08-27 DIAGNOSIS — C689 Malignant neoplasm of urinary organ, unspecified: Secondary | ICD-10-CM

## 2023-08-27 MED ORDER — LEVOTHYROXINE SODIUM 100 MCG PO TABS: 100.0000 ug | ORAL_TABLET | Freq: Every day | ORAL | 3 refills | Status: DC

## 2023-08-28 ENCOUNTER — Encounter: Payer: Self-pay | Admitting: Internal Medicine

## 2023-08-29 ENCOUNTER — Ambulatory Visit (HOSPITAL_COMMUNITY)
Admission: RE | Admit: 2023-08-29 | Discharge: 2023-08-29 | Disposition: A | Discharge status: Home / Self Care | Source: Ambulatory Visit | Attending: Physician Assistant

## 2023-08-29 ENCOUNTER — Ambulatory Visit (HOSPITAL_COMMUNITY)
Admission: RE | Admit: 2023-08-29 | Discharge: 2023-08-29 | Disposition: A | Discharge status: Home / Self Care | Source: Ambulatory Visit | Attending: Physician Assistant | Admitting: Physician Assistant

## 2023-08-29 DIAGNOSIS — M25551 Pain in right hip: Secondary | ICD-10-CM | POA: Diagnosis not present

## 2023-08-29 DIAGNOSIS — C679 Malignant neoplasm of bladder, unspecified: Secondary | ICD-10-CM | POA: Diagnosis not present

## 2023-08-29 DIAGNOSIS — C7801 Secondary malignant neoplasm of right lung: Secondary | ICD-10-CM | POA: Diagnosis not present

## 2023-08-29 DIAGNOSIS — C3490 Malignant neoplasm of unspecified part of unspecified bronchus or lung: Secondary | ICD-10-CM | POA: Diagnosis not present

## 2023-08-29 DIAGNOSIS — C7800 Secondary malignant neoplasm of unspecified lung: Secondary | ICD-10-CM | POA: Insufficient documentation

## 2023-08-29 DIAGNOSIS — M160 Bilateral primary osteoarthritis of hip: Secondary | ICD-10-CM | POA: Diagnosis not present

## 2023-08-29 DIAGNOSIS — S73191A Other sprain of right hip, initial encounter: Secondary | ICD-10-CM | POA: Diagnosis not present

## 2023-08-29 DIAGNOSIS — C7802 Secondary malignant neoplasm of left lung: Secondary | ICD-10-CM | POA: Diagnosis not present

## 2023-08-29 MED ORDER — GADOBUTROL 1 MMOL/ML IV SOLN
5.0000 mL | Freq: Once | INTRAVENOUS | Status: AC | PRN
  Administered 2023-08-29: 5 mL via INTRAVENOUS

## 2023-08-29 MED ORDER — IOHEXOL 300 MG/ML  SOLN
80.0000 mL | Freq: Once | INTRAMUSCULAR | Status: AC | PRN
  Administered 2023-08-29: 75 mL via INTRAVENOUS

## 2023-08-31 ENCOUNTER — Inpatient Hospital Stay: Payer: Medicare Other

## 2023-09-05 ENCOUNTER — Other Ambulatory Visit: Payer: Self-pay | Admitting: Nurse Practitioner

## 2023-09-05 DIAGNOSIS — G893 Neoplasm related pain (acute) (chronic): Secondary | ICD-10-CM

## 2023-09-05 DIAGNOSIS — Z515 Encounter for palliative care: Secondary | ICD-10-CM

## 2023-09-05 MED ORDER — SENNOSIDES-DOCUSATE SODIUM 8.6-50 MG PO TABS: 2.0000 | ORAL_TABLET | Freq: Two times a day (BID) | ORAL | 6 refills | Status: DC

## 2023-09-05 MED ORDER — OXYCODONE-ACETAMINOPHEN 5-325 MG PO TABS
1.0000 | ORAL_TABLET | Freq: Four times a day (QID) | ORAL | 0 refills | Status: DC | PRN
Start: 2023-09-05 — End: 2023-09-21

## 2023-09-07 ENCOUNTER — Inpatient Hospital Stay

## 2023-09-07 ENCOUNTER — Inpatient Hospital Stay (HOSPITAL_BASED_OUTPATIENT_CLINIC_OR_DEPARTMENT_OTHER): Admitting: Internal Medicine

## 2023-09-07 ENCOUNTER — Encounter: Payer: Self-pay | Admitting: Internal Medicine

## 2023-09-07 ENCOUNTER — Inpatient Hospital Stay (HOSPITAL_BASED_OUTPATIENT_CLINIC_OR_DEPARTMENT_OTHER): Admitting: Nurse Practitioner

## 2023-09-07 ENCOUNTER — Ambulatory Visit: Payer: Medicare Other | Admitting: Internal Medicine

## 2023-09-07 ENCOUNTER — Encounter: Payer: Self-pay | Admitting: Nurse Practitioner

## 2023-09-07 ENCOUNTER — Ambulatory Visit: Payer: Medicare Other

## 2023-09-07 ENCOUNTER — Other Ambulatory Visit: Payer: Medicare Other

## 2023-09-07 ENCOUNTER — Other Ambulatory Visit (HOSPITAL_COMMUNITY): Payer: Self-pay

## 2023-09-07 VITALS — BP 122/79 | HR 88 | Temp 98.4°F | Resp 15 | Ht 63.0 in | Wt 118.6 lb

## 2023-09-07 DIAGNOSIS — C3490 Malignant neoplasm of unspecified part of unspecified bronchus or lung: Secondary | ICD-10-CM

## 2023-09-07 DIAGNOSIS — C7951 Secondary malignant neoplasm of bone: Secondary | ICD-10-CM | POA: Diagnosis not present

## 2023-09-07 DIAGNOSIS — Z7189 Other specified counseling: Secondary | ICD-10-CM

## 2023-09-07 DIAGNOSIS — Z5111 Encounter for antineoplastic chemotherapy: Secondary | ICD-10-CM | POA: Diagnosis not present

## 2023-09-07 DIAGNOSIS — Z5189 Encounter for other specified aftercare: Secondary | ICD-10-CM | POA: Diagnosis not present

## 2023-09-07 DIAGNOSIS — Z95828 Presence of other vascular implants and grafts: Secondary | ICD-10-CM | POA: Diagnosis not present

## 2023-09-07 DIAGNOSIS — G893 Neoplasm related pain (acute) (chronic): Secondary | ICD-10-CM | POA: Diagnosis not present

## 2023-09-07 DIAGNOSIS — K5903 Drug induced constipation: Secondary | ICD-10-CM

## 2023-09-07 DIAGNOSIS — C7800 Secondary malignant neoplasm of unspecified lung: Secondary | ICD-10-CM

## 2023-09-07 DIAGNOSIS — J9811 Atelectasis: Secondary | ICD-10-CM | POA: Diagnosis not present

## 2023-09-07 DIAGNOSIS — Z515 Encounter for palliative care: Secondary | ICD-10-CM | POA: Diagnosis not present

## 2023-09-07 DIAGNOSIS — T451X5A Adverse effect of antineoplastic and immunosuppressive drugs, initial encounter: Secondary | ICD-10-CM

## 2023-09-07 DIAGNOSIS — C3412 Malignant neoplasm of upper lobe, left bronchus or lung: Secondary | ICD-10-CM | POA: Diagnosis not present

## 2023-09-07 DIAGNOSIS — C679 Malignant neoplasm of bladder, unspecified: Secondary | ICD-10-CM

## 2023-09-07 LAB — SAMPLE TO BLOOD BANK

## 2023-09-07 LAB — CBC WITH DIFFERENTIAL (CANCER CENTER ONLY)
Abs Immature Granulocytes: 0.02 10*3/uL (ref 0.00–0.07)
Basophils Absolute: 0 10*3/uL (ref 0.0–0.1)
Basophils Relative: 1 %
Eosinophils Absolute: 0.1 10*3/uL (ref 0.0–0.5)
Eosinophils Relative: 1 %
HCT: 26.5 % — ABNORMAL LOW (ref 36.0–46.0)
Hemoglobin: 8.6 g/dL — ABNORMAL LOW (ref 12.0–15.0)
Immature Granulocytes: 0 %
Lymphocytes Relative: 4 %
Lymphs Abs: 0.2 10*3/uL — ABNORMAL LOW (ref 0.7–4.0)
MCH: 30.3 pg (ref 26.0–34.0)
MCHC: 32.5 g/dL (ref 30.0–36.0)
MCV: 93.3 fL (ref 80.0–100.0)
Monocytes Absolute: 1.1 10*3/uL — ABNORMAL HIGH (ref 0.1–1.0)
Monocytes Relative: 17 %
Neutro Abs: 5 10*3/uL (ref 1.7–7.7)
Neutrophils Relative %: 77 %
Platelet Count: 293 10*3/uL (ref 150–400)
RBC: 2.84 MIL/uL — ABNORMAL LOW (ref 3.87–5.11)
RDW: 19.9 % — ABNORMAL HIGH (ref 11.5–15.5)
WBC Count: 6.4 10*3/uL (ref 4.0–10.5)
nRBC: 0 % (ref 0.0–0.2)

## 2023-09-07 LAB — CMP (CANCER CENTER ONLY)
ALT: 10 U/L (ref 0–44)
AST: 11 U/L — ABNORMAL LOW (ref 15–41)
Albumin: 3.6 g/dL (ref 3.5–5.0)
Alkaline Phosphatase: 77 U/L (ref 38–126)
Anion gap: 7 (ref 5–15)
BUN: 17 mg/dL (ref 8–23)
CO2: 28 mmol/L (ref 22–32)
Calcium: 9.4 mg/dL (ref 8.9–10.3)
Chloride: 100 mmol/L (ref 98–111)
Creatinine: 1.32 mg/dL — ABNORMAL HIGH (ref 0.44–1.00)
GFR, Estimated: 43 mL/min — ABNORMAL LOW (ref >60)
Glucose, Bld: 128 mg/dL — ABNORMAL HIGH (ref 70–99)
Potassium: 4.3 mmol/L (ref 3.5–5.1)
Sodium: 135 mmol/L (ref 135–145)
Total Bilirubin: 0.3 mg/dL (ref 0.0–1.2)
Total Protein: 7.1 g/dL (ref 6.5–8.1)

## 2023-09-07 MED ORDER — SODIUM CHLORIDE 0.9% FLUSH
10.0000 mL | Freq: Once | INTRAVENOUS | Status: AC
  Administered 2023-09-07: 10 mL

## 2023-09-07 MED ORDER — HEPARIN SOD (PORK) LOCK FLUSH 100 UNIT/ML IV SOLN
500.0000 [IU] | Freq: Once | INTRAVENOUS | Status: AC
  Administered 2023-09-07: 500 [IU]

## 2023-09-07 MED ORDER — FENTANYL 12 MCG/HR TD PT72
1.0000 | MEDICATED_PATCH | TRANSDERMAL | 0 refills | Status: DC
  Filled 2023-09-07: qty 10, 30d supply, fill #0

## 2023-09-07 NOTE — Progress Notes (Addendum)
 Palliative Medicine [facility]  Telephone:([phone] Fax:(336) [phone]   Name: Evelyn Jordan Date: 09/07/2023 MRN: [id number]  DOB: [birth year]  Patient Care Team: Luevenia Saha, MD as PCP - General (Family Medicine) Rozelle Corning, Maricela Shoe, MD as Consulting Physician (Orthopedic Surgery) Alexandro Ide, OD as Consulting Physician (Ophthalmology) Baldwin Levee Delora Ferry, MD as Consulting Physician (Pulmonary Disease) Marlene Simas, MD as Consulting Physician (Oncology) Pickenpack-Cousar, Giles Labrum, NP as Nurse Practitioner (Hospice and Palliative Medicine)    INTERVAL HISTORY: Evelyn Jordan is a 73 y.o. 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 Palliative ask to see for symptom management and goals of care.   SOCIAL HISTORY:     reports that she quit smoking about 13 years ago. Her smoking use included cigarettes. She started smoking about 54 years ago. She has a 41 pack-year smoking history. She has never used smokeless tobacco. She reports current alcohol use. She reports that she does not use drugs.  ADVANCE DIRECTIVES:  Advanced directives on file naming Evelyn Jordan and Evelyn Jordan as the primary and secondary decision makers should the patient be unable to make her own decisions.   CODE STATUS: DNR  PAST MEDICAL HISTORY: Past Medical History:  Diagnosis Date   Depression    Fatty liver    Insomnia    Lung cancer (HCC) 07/25/2022   Osteoporosis    Pericardial effusion 03/05/2023   small noted on ECHO   Pulmonary nodule    Thyroid nodule     ALLERGIES:  is allergic to emend [fosaprepitant dimeglumine] and compazine [prochlorperazine edisylate].  MEDICATIONS:  Current Outpatient Medications  Medication Sig Dispense Refill   fentaNYL (DURAGESIC) 12 MCG/HR Place 1 patch onto the skin every 3 (three) days. 10 patch 0   acetaminophen (TYLENOL) 325 MG tablet Take  2 tablets (650 mg total) by mouth every 6 (six) hours as needed for mild pain (pain score 1-3) (or Fever >/= 101).     bisacodyl (DULCOLAX) 5 MG EC tablet Take 10-15 mg by mouth daily as needed for moderate constipation.     gabapentin (NEURONTIN) 300 MG capsule Take one capsule (300mg ) every morning and 2 capsules (600mg ) at bedtime. 90 capsule 0   Iron, Ferrous Sulfate, 325 (65 Fe) MG TABS Take 65 mg by mouth daily with breakfast.     levothyroxine (SYNTHROID) 100 MCG tablet Take 1 tablet (100 mcg total) by mouth daily before breakfast. 90 tablet 3   ondansetron (ZOFRAN) 8 MG tablet Take 1 tablet (8 mg total) by mouth every 8 (eight) hours as needed for nausea or vomiting. Start after day 3 of chemotherapy if needed 30 tablet 2   oxyCODONE-acetaminophen (PERCOCET/ROXICET) 5-325 MG tablet Take 1 tablet by mouth every 6 (six) hours as needed for severe pain (pain score 7-10). 60 tablet 0   senna-docusate (SENNA S) 8.6-50 MG tablet Take 2 tablets by mouth 2 (two) times daily. 120 tablet 6   zolpidem (AMBIEN) 10 MG tablet Take 0.5-1 tablets (5-10 mg total) by mouth at bedtime as needed for sleep. 30 tablet 1   No current facility-administered medications for this visit.    VITAL SIGNS: There were no vitals taken for this visit. There were no vitals filed for this visit.  Estimated body mass index is 21.01 kg/m as calculated from the following:   Height as of an earlier encounter on 09/07/23: 5\' 3"  (1.6 m).   Weight  as of an earlier encounter on 09/07/23: 118 lb 9.6 oz (53.8 kg).   PERFORMANCE STATUS (ECOG) : 1 - Symptomatic but completely ambulatory   Physical Exam General: NAD Cardiovascular: regular rate and rhythm Pulmonary: normal breathing pattern Extremities: no edema, no joint deformities Skin: no rashes Neurological: AAO x3  IMPRESSION: Discussed the use of AI scribe software for clinical note transcription with the patient, who gave verbal consent to proceed.  History of  Present Illness Evelyn Jordan is a 73 year old female who presents with severe right groin pain and goals of care discussions. She is accompanied by Evelyn Jordan (daughter) and Evelyn Jordan, her spouse. Patient shares she recently went to the beach with her family. She was able to enjoy herself utilizing a scooter.   Denies concerns with nausea, vomiting, or diarrhea. Endorses some challenges with constipation. Currently trying to managed with diet and prune juice.  Education provided on the importance of bowel regimen in the setting of opioid use.  Recommended patient begin use of Senna-S 2 tablets at bedtime.  If no improvement may increase to 2 tablets twice daily.  She and family verbalized understanding.  Weight is stable at 118 pounds.  We discussed Evelyn Jordan's pain at length. She experiences severe, intense, and persistent pain localized to the right groin area. She is taking her pain medication (Percocet 5/325 mg), every four to six hours.  Reports this somewhat helps her pain however it continues to be severe and not well-controlled overall. The pain has significantly impacted her daily life, causing insomnia.  Education provided on continued use of Percocet 5/325 mg with ability to take 1-2 tablets every 4-6 hours as needed for breakthrough pain.  Education provided on the use of fentanyl patch including administration, efficacy, and potential side effects.  We discussed the patient's current illness and what it means in the larger context of their on-going co-morbidities. Natural disease trajectory and expectations were discussed.  Evelyn Jordan and her family are realistic in their understanding of her cancer progression.  They are clear and expressed wishes to focus solely on her comfort allowing her the best quality of life for what time she has left.  She has a pending MRI which she would like to obtain results to determine if there are any other interventions available to assist with pain management  such as nerve block versus radiation therapy.  She is considering transitioning to hospice care, currently under palliative care.  Patient and family understands once all interventions have been completed she would then be a candidate for outpatient hospice support.  Extensive discussion provided including education on the differences between palliative and hospice, goals and philosophy of care, what care would look like in the home, and aggressive symptom management.  At this time recommended outpatient palliative referral with plans to transition over to hospice once appropriate.  Patient and family verbalized agreement and understanding.  I empathetically approach discussions regarding healthcare limitations including advanced directives, DNR, and MOST form.  Mrs. Mongeau expressed wishes for DNR/DNI with a desire of natural death.  Documented advanced directives on file. I completed a MOST form today with patient and her family. They outlined their wishes for the following treatment decisions:  Cardiopulmonary Resuscitation: Do Not Attempt Resuscitation (DNR/No CPR)  Medical Interventions: Comfort Measures: Keep clean, warm, and dry. Use medication by any route, positioning, wound care, and other measures to relieve pain and suffering. Use oxygen, suction and manual treatment of airway obstruction as needed for comfort. Do not  transfer to the hospital unless comfort needs cannot be met in current location.  Antibiotics: Determine use of limitation of antibiotics when infection occurs  IV Fluids: No IV fluids (provide other measures to ensure comfort)  Feeding Tube: No feeding tube    I discussed the importance of continued conversation with family and their medical providers regarding overall plan of care and treatment options, ensuring decisions are within the context of the patients values and GOCs. Assessment & Plan Cancer Related Pain. Severe right groin pain not well controlled on current  regimen. Awaiting MRI results to determine the exact cause and appropriate intervention. Per Oncology team potential interventions including nerve block and radiation therapy. Fentanyl patch initiated for pain management, with instructions on application and monitoring for side effects. - Expedite MRI results - Consider nerve block based on MRI findings per oncology. - Consider radiation therapy based on MRI findings per oncology. - Start fentanyl patch, change every three days, monitor for side effects such as nausea, vomiting, rash, increased drowsiness, or shortness of breath - Educate on proper application of fentanyl patch  Constipation Constipation secondary to opioid use for pain management. Initial management with prune juice was insufficient. - Start Senokot, two tablets at bedtime, increase to two in the morning and two at night if no improvement in 24 hours  Palliative care management Currently under palliative care with consideration for transition to hospice care. Hospice care not initiated due to potential need for procedural interventions like nerve block or radiation, which are not covered under hospice guidelines. Plan to transition to hospice once procedural interventions are completed. Discussed outpatient palliative care as an interim measure until hospice is appropriate. - Refer to outpatient palliative care - Transition to hospice care after completion of procedural interventions for pain management - Educate on hospice services and insurance coverage - Discuss advance directives and complete necessary forms -Patient is aware we will remain as her primary support for symptom management until she transitions over to hospice.  Goals of Care Discussion of advance directives and goals of care, including DNR status and preferences for medical interventions. She prefers comfort measures and to remain at home as long as possible. Discussed options for antibiotics, IV fluids, and  artificial feeding, with a preference for comfort-focused care and avoiding invasive interventions unless necessary for comfort. - Completed and provide copies of DNR and MOST forms.  See above. -Patient and family are clear and expressed wishes to focus on patient's comfort allow her the best quality of life for what time she has left. - Ensure forms are easily accessible at home -[facility] to [facility] for [facility] palliative.   Follow-up Follow-up plans discussed to ensure continuity of care and timely intervention based on MRI results and pain management needs. - Follow up on MRI results and communicate findings - Coordinate scheduling for nerve block or radiation therapy if indicated - Check in with her in one week to assess pain management and fentanyl patch efficacy - Provide contact information for ongoing support and questions  Patient expressed understanding and was in agreement with this plan. She also understands that She can call the clinic at any time with any questions, concerns, or complaints.   Any controlled substances utilized were prescribed in the context of palliative care. PDMP has been reviewed.   I provided 75 minutes of  face-to-face  during this encounter, and > 50% was spent counseling as documented under my assessment & plan. Visit consisted of counseling and education  dealing with the complex and emotionally intense issues of symptom management and palliative care in the setting of serious and potentially life-threatening illness.  Dellia Ferguson, AGPCNP-BC  Palliative Medicine Team/[facility]

## 2023-09-07 NOTE — Progress Notes (Signed)
 [facility] Telephone:([phone]) [phone]   [phone]  OFFICE PROGRESS NOTE  [name], MD [address] [place] [address]  DIAGNOSIS:  1) Stage IIIB (T3, N2, M0) non-small cell lung cancer, squamous cell carcinoma presented with obstructive left lingular mass with consolidation and collapse of the lingula as well as part of the left upper lobe and likely invasion of the mediastinum diagnosed in February 2024.  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  Biomarker Findings Microsatellite status - MS-Stable Tumor Mutational Burden - 5 Muts/Mb Genomic Findings For a complete list of the genes assayed, please refer to the Appendix. CCND1 amplification FGFR1 amplification MDM2 amplification FGF19 amplification FGF3 amplification FGF4 amplification TP53 Q136* 8 Disease relevant genes with no reportable alterations: ALK, BRAF, EGFR, ERBB2, KRAS, MET, RET, ROS1  PDL1: 5%   PRIOR THERAPY:  1) Concurrent chemoradiation with weekly carboplatin for AUC of 2 and paclitaxel 45 Mg/M2. Status post 7 weeks of treatment.  Last dose was given October 06, 2022 with partial response. 2) Consolidation treatment with immunotherapy with Imfinzi 1500 Mg IV every 4 weeks.  First dose on November 13, 2022.  Status post 2 cycles.  This was discontinued secondary to toxicity and suspicious immunotherapy mediated pneumonitis. 3) Right Kidney and Ureter Nephrectomy under the care of Dr. Sande Brothers. Tumor extensively involves renal pelvis, extends through the kidney into the perinephric fat with lymphovascular invasion and positive margin at the peri soft tissue of the renal vein. 4) Carboplatin for AUC of 5 on day 1 and gemcitabine 1000 Mg/M2  on days 1 and 8 IV every 3 weeks. First dose on 06/15/2023.  Status post 3 cycles.   CURRENT THERAPY: Hospice and palliative care.  INTERVAL HISTORY: Evelyn Jordan 73 y.o.  female returns to the clinic today for follow-up visit accompanied by her husband and daughter.Discussed the use of AI scribe software for clinical note transcription with the patient, who gave verbal consent to proceed.  History of Present Illness   Evelyn Jordan is a 73 year old female with metastatic urinary carcinoma and stage 3B non-small cell lung cancer who presents for evaluation with repeat CT scan for restaging of her disease. She is accompanied by her husband and daughter.  She has a history of metastatic urinary carcinoma with a squamous cell component, diagnosed in December 2024. She has been undergoing treatment with carboplatin and gemcitabine, completing three cycles. Significant pancytopenia and fatigue have been noted as side effects of the treatment.  She also has a history of stage 3B non-small cell lung cancer, squamous cell carcinoma, diagnosed in February 2024. Over the past two weeks, she has experienced increased fatigue and more pain in the right hip and groin area. An MRI was performed, but the results are still pending. There is also a slight increase in coughing.  She is currently taking one Percocet every four to six hours for pain management, but it is not fully effective, especially for her groin pain. She has difficulty walking more than ten feet without significant pain.  She stopped her iron supplements after recent iron studies showed high ferritin levels, with a ferritin of 430 and normal iron levels. Her hemoglobin is currently 8.6, and her blood count has recovered to normal levels except for anemia. Her kidney function was stable at 1.34 from the last check, but today's results are not yet available.  MEDICAL HISTORY: Past Medical History:  Diagnosis Date   Depression    Fatty liver    Insomnia    Lung cancer (HCC) 07/25/2022   Osteoporosis    Pericardial effusion 03/05/2023   small noted on ECHO   Pulmonary nodule    Thyroid nodule      ALLERGIES:  is allergic to emend [fosaprepitant dimeglumine] and compazine [prochlorperazine edisylate].  MEDICATIONS:  Current Outpatient Medications  Medication Sig Dispense Refill   acetaminophen (TYLENOL) 325 MG tablet Take 2 tablets (650 mg total) by mouth every 6 (six) hours as needed for mild pain (pain score 1-3) (or Fever >/= 101).     bisacodyl (DULCOLAX) 5 MG EC tablet Take 10-15 mg by mouth daily as needed for moderate constipation.     gabapentin (NEURONTIN) 300 MG capsule Take one capsule (300mg ) every morning and 2 capsules (600mg ) at bedtime. 90 capsule 0   Iron, Ferrous Sulfate, 325 (65 Fe) MG TABS Take 65 mg by mouth daily with breakfast.     levothyroxine (SYNTHROID) 100 MCG tablet Take 1 tablet (100 mcg total) by mouth daily before breakfast. 90 tablet 3   ondansetron (ZOFRAN) 8 MG tablet Take 1 tablet (8 mg total) by mouth every 8 (eight) hours as needed for nausea or vomiting. Start after day 3 of chemotherapy if needed 30 tablet 2   oxyCODONE-acetaminophen (PERCOCET/ROXICET) 5-325 MG tablet Take 1 tablet by mouth every 6 (six) hours as needed for severe pain (pain score 7-10). 60 tablet 0   senna-docusate (SENNA S) 8.6-50 MG tablet Take 2 tablets by mouth 2 (two) times daily. 120 tablet 6   zolpidem (AMBIEN) 10 MG tablet Take 0.5-1 tablets (5-10 mg total) by mouth at bedtime as needed for sleep. 30 tablet 1   No current facility-administered medications for this visit.    SURGICAL HISTORY:  Past Surgical History:  Procedure Laterality Date   ANKLE SURGERY Right    BREAST BIOPSY Right    benign 2013   BRONCHIAL BIOPSY  08/04/2022   Procedure: BRONCHIAL BIOPSIES;  Surgeon: Leslye Peer, MD;  Location: [facility] ENDOSCOPY;  Service: Cardiopulmonary;;   BRONCHIAL BIOPSY  06/22/2023   Procedure: BRONCHIAL BIOPSIES;  Surgeon: Leslye Peer, MD;  Location: [facility] ENDOSCOPY;  Service: Pulmonary;;   BRONCHIAL BRUSHINGS  08/04/2022   Procedure: BRONCHIAL BRUSHINGS;  Surgeon:  Leslye Peer, MD;  Location: [facility] ENDOSCOPY;  Service: Cardiopulmonary;;   BRONCHIAL BRUSHINGS  06/22/2023   Procedure: BRONCHIAL BRUSHINGS;  Surgeon: Leslye Peer, MD;  Location: [facility] ENDOSCOPY;  Service: Pulmonary;;   BRONCHIAL NEEDLE ASPIRATION BIOPSY  06/22/2023   Procedure: BRONCHIAL NEEDLE ASPIRATION BIOPSIES;  Surgeon: Leslye Peer, MD;  Location: [facility] ENDOSCOPY;  Service: Pulmonary;;   BRONCHIAL WASHINGS  06/22/2023   Procedure: BRONCHIAL WASHINGS;  Surgeon: Leslye Peer, MD;  Location: [facility] ENDOSCOPY;  Service: Pulmonary;;   COLONOSCOPY     CYSTOSCOPY WITH BIOPSY N/A 05/15/2023   Procedure: CYSTOSCOPY WITH RIGHT RENAL PELVIS BIOPSY;  Surgeon: Rene Paci, MD;  Location: [facility] ORS;  Service: Urology;  Laterality: N/A;   CYSTOSCOPY WITH STENT PLACEMENT Right 05/26/2023   Procedure: CYSTOSCOPY WITH RIGHT STENT PLACEMENT; TUR OF RIGHT URETERAL ORISICE;  Surgeon: Rene Paci, MD;  Location: [facility] ORS;  Service: Urology;  Laterality: Right;   CYSTOSCOPY/RETROGRADE/URETEROSCOPY N/A 05/15/2023   Procedure: CYSTOSCOPY, RIGHT RETROGRADE, RIGHT URETEROSCOPY, RIGHT STENT PLACEMENT;  Surgeon: Rene Paci, MD;  Location: [facility] ORS;  Service: Urology;  Laterality: N/A;  30 MINUTES  HEMOSTASIS CONTROL  08/04/2022   Procedure: HEMOSTASIS CONTROL;  Surgeon: Leslye Peer, MD;  Location: [facility] ENDOSCOPY;  Service: Cardiopulmonary;;   IR IMAGING GUIDED PORT INSERTION  09/04/2022   repair of a torn meniscus Left 05/2018   ROBOT ASSITED LAPAROSCOPIC NEPHROURETERECTOMY Right 05/26/2023   Procedure: XI ROBOT ASSITED LAPAROSCOPIC NEPHROURETERECTOMY;  Surgeon: Rene Paci, MD;  Location: [facility] ORS;  Service: Urology;  Laterality: Right;  210 MINS FOR CASE   VIDEO BRONCHOSCOPY Left 08/04/2022   Procedure: VIDEO BRONCHOSCOPY WITH FLUORO;  Surgeon: Leslye Peer, MD;  Location: [facility] ENDOSCOPY;  Service: Cardiopulmonary;  Laterality: Left;    REVIEW OF SYSTEMS:   Constitutional: positive for fatigue Eyes: negative Ears, nose, mouth, throat, and face: negative Respiratory: positive for cough Cardiovascular: negative Gastrointestinal: negative Genitourinary:negative Integument/breast: negative Hematologic/lymphatic: negative Musculoskeletal:positive for bone pain Neurological: negative Behavioral/Psych: negative Endocrine: negative Allergic/Immunologic: negative   PHYSICAL EXAMINATION: General appearance: alert, cooperative, fatigued, and no distress Head: Normocephalic, without obvious abnormality, atraumatic Neck: no adenopathy, no JVD, supple, symmetrical, trachea midline, and thyroid not enlarged, symmetric, no tenderness/mass/nodules Lymph nodes: Cervical, supraclavicular, and axillary nodes normal. Resp: clear to auscultation bilaterally Back: symmetric, no curvature. ROM normal. No CVA tenderness. Cardio: regular rate and rhythm, S1, S2 normal, no murmur, click, rub or gallop GI: soft, non-tender; bowel sounds normal; no masses,  no organomegaly Extremities: extremities normal, atraumatic, no cyanosis or edema Neurologic: Alert and oriented X 3, normal strength and tone. Normal symmetric reflexes. Normal coordination and gait  ECOG PERFORMANCE STATUS: 1 - Symptomatic but completely ambulatory  Blood pressure 122/79, pulse 88, temperature 98.4 F (36.9 C), temperature source Temporal, resp. rate 15, height 5\' 3"  (1.6 m), weight 118 lb 9.6 oz (53.8 kg), SpO2 98%.  LABORATORY DATA: Lab Results  Component Value Date   WBC 6.4 09/07/2023   HGB 8.6 (L) 09/07/2023   HCT 26.5 (L) 09/07/2023   MCV 93.3 09/07/2023   PLT 293 09/07/2023      Chemistry      Component Value Date/Time   NA 134 (L) 08/24/2023 0847   NA 141 03/07/2015 0000   K 4.2 08/24/2023 0847   CL 100 08/24/2023 0847   CO2 28 08/24/2023 0847   BUN 16 08/24/2023 0847   CREATININE 1.34 (H) 08/24/2023 0847   GLU 103 03/07/2015 0000      Component Value Date/Time    CALCIUM 9.1 08/24/2023 0847   ALKPHOS 78 08/24/2023 0847   AST 17 08/24/2023 0847   ALT 14 08/24/2023 0847   BILITOT 0.4 08/24/2023 0847       RADIOGRAPHIC STUDIES: CT CHEST ABDOMEN PELVIS W CONTRAST Result Date: 09/05/2023 CLINICAL DATA:  Metastatic urothelial cancer and lung cancer. Restaging. * Tracking Code: BO * EXAM: CT CHEST, ABDOMEN, AND PELVIS WITH CONTRAST TECHNIQUE: Multidetector CT imaging of the chest, abdomen and pelvis was performed following the standard protocol during bolus administration of intravenous contrast. RADIATION DOSE REDUCTION: This exam was performed according to the departmental dose-optimization program which includes automated exposure control, adjustment of the mA and/or kV according to patient size and/or use of iterative reconstruction technique. CONTRAST:  75mL OMNIPAQUE IOHEXOL 300 MG/ML  SOLN COMPARISON:  07/26/2023 CT chest, abdomen and pelvis. FINDINGS: CT CHEST FINDINGS Cardiovascular: Normal heart size. Small pericardial effusion is stable. Right internal jugular Port-A-Cath terminates near the cavoatrial junction. Left anterior descending and left circumflex coronary atherosclerosis. Atherosclerotic nonaneurysmal thoracic aorta. Normal caliber pulmonary arteries. No central pulmonary emboli. Mediastinum/Nodes: No significant thyroid nodules. Unremarkable esophagus.  No pathologically enlarged axillary, mediastinal or hilar lymph nodes. Lungs/Pleura: No pneumothorax. Small layering right pleural effusion is new. New enhancing 1.5 cm anterior right pleural implant along the anterior right hemidiaphragm (series 2/image 44). New enhancing 0.9 cm posterior right pleural implant (series 2/image 39). No left pleural effusion. Scattered calcified bilateral pulmonary granulomas are unchanged. Central anterior right lower lobe 1.2 cm solid pulmonary nodule (series 6/image 81), increased from 0.7 cm. Solid peripheral right lower lobe 0.7 cm pulmonary nodule (series 6/image  101), increased from 0.3 cm. Patchy ground-glass opacity in the posterior right upper lobe is new. Solid 0.9 cm posterior left upper lobe pulmonary nodule (series 6/image 38), previously 0.9 cm, stable. Solid subpleural 1.4 x 1.3 cm medial left upper lobe pulmonary nodule (series 6/image 54), minimally increased from 1.3 x 1.0 cm. Sharply marginated perihilar consolidation throughout the left upper and left lower lobes with associated volume loss, bronchiectasis and distortion, otherwise unchanged and compatible with radiation fibrosis. Musculoskeletal: Lytic destructive T11 spinous process1 bone metastasis (series 10/image 94), minimally progressive in the interval. Moderate thoracic spondylosis. CT ABDOMEN PELVIS FINDINGS Hepatobiliary: Normal liver with no liver mass. Normal gallbladder with no radiopaque cholelithiasis. No biliary ductal dilatation. Pancreas: Normal, with no mass or duct dilation. Spleen: Normal size. No mass. Adrenals/Urinary Tract: Right adrenal gland appears surgically absent. No left adrenal nodules. No evidence of right nephrectomy bed recurrent mass. No overt left hydronephrosis. Hypodense 1.5 x 1.2 cm lower left renal cortical mass (series 12/image 27), increased from 0.4 x 0.4 cm. Hypodense 2.4 x 2.0 cm posterior interpolar left renal cortical mass (series 12/image 16), mildly increased from 2.3 x 1.7 cm. Normal bladder. Stomach/Bowel: Small hiatal hernia. Otherwise normal stomach. Normal caliber small bowel with no small bowel wall thickening. Appendix not discretely visualized. Oral contrast transits to the right colon. Moderate diffuse colonic stool, with no large bowel wall thickening, diverticulosis or significant pericolonic fat stranding. Vascular/Lymphatic: Atherosclerotic nonaneurysmal abdominal aorta. Patent portal, splenic and hepatic veins. Infiltrative heterogeneously enhancing retroperitoneal adenopathy is increased. Representative high aortocaval 3.3 x 2.7 cm mass (series  2/image 58), increased from 2.3 x 2.0 cm. Representative newly enlarged 1.1 cm left para-aortic node (series 2/image 64). No pathologically enlarged pelvic nodes. Reproductive: Grossly normal uterus.  No adnexal mass. Other: No pneumoperitoneum, ascites or focal fluid collection. Musculoskeletal: No aggressive appearing focal osseous lesions. Moderate lumbar spondylosis. IMPRESSION: 1. Progressive metastatic disease since 07/26/2023 CT. 2. New small layering malignant right pleural effusion with new multifocal enhancing right pleural metastases. 3. Enlarging bilateral pulmonary metastases in the right lower and left upper lobes. 4. Progressive retroperitoneal metastatic adenopathy. 5. Progressive left kidney metastases. 6. Lytic destructive T11 spinous process bone metastasis, minimally progressive in the interval. 7. Stable small pericardial effusion. 8. Two-vessel coronary atherosclerosis. 9. Small hiatal hernia. 10.  Aortic Atherosclerosis (ICD10-I70.0). Electronically Signed   By: Delbert Phenix M.D.   On: 09/05/2023 15:29   DG Chest 2 View Result Date: 08/14/2023 CLINICAL DATA:  Shortness of breath.  History of lung cancer. EXAM: CHEST - 2 VIEW COMPARISON:  June 22, 2023.  July 26, 2023. FINDINGS: Stable cardiomediastinal silhouette. Right internal jugular Port-A-Cath is unchanged. Stable left perihilar density is noted which may represent post treatment change, although underlying neoplasm cannot be excluded. Stable bilateral pulmonary nodules are noted. Bony thorax is unremarkable. IMPRESSION: Stable left perihilar density is noted which may represent post treatment change, although underlying neoplasm cannot be excluded. Stable bilateral pulmonary nodules are noted. No new abnormality seen. Electronically  Signed   By: Lupita Raider M.D.   On: 08/14/2023 11:52    ASSESSMENT AND PLAN: This is a very pleasant 73 years old white female with  1) Stage IIIB (T3, N2, M0) non-small cell lung cancer,  squamous cell carcinoma presented with obstructive left lingular mass with consolidation and collapse of the lingula as well as part of the left upper lobe and likely invasion of the mediastinum diagnosed in February 2024.  Molecular studies by [facility] 1 showed no actionable mutations PDL1: 5% The patient underwent a course of concurrent chemoradiation with weekly carboplatin for AUC of 2 and paclitaxel 45 Mg/M2 status post 7  cycles.  Last dose was given on October 06, 2022 with partial response. The patient tolerated the previous course of her treatment fairly well with no concerning adverse effect except for the fatigue and the radiation-induced esophagitis. She had partial response to this treatment. She underwent consolidation treatment with immunotherapy with Imfinzi 1500 Mg IV every 4 weeks status post 2 cycle.  Her treatment was discontinued secondary to toxicity.  The patient has been on observation 2) metastatic urothelial carcinoma with squamous cell differentiation status post right ureteral nephrectomy in December 2024.  She is currently on systemic chemotherapy with carboplatin for AUC of 5 on day 1 and gemcitabine 1000 Mg/M2 on days 1 and 8 every 3 weeks.  She is status post 3 cycles.  Her treatment has been complicated with increasing fatigue and weakness as well as pancytopenia. She had repeat CT scan of the chest, abdomen and pelvis performed recently.  I personally and independently reviewed the scan and discussed the result with the patient today.  Unfortunately her scan showed significant disease progression. I had a lengthy discussion with the patient and her family today about her current condition and treatment options.    Metastatic urothelial carcinoma with squamous cell component Diagnosed in December 2024. Aggressive progression with abdominal and pulmonary involvement. Not a candidate for immunotherapy or further chemotherapy due to pancytopenia and fatigue from prior  carboplatin and gemcitabine treatment. Prognosis is less than three months. - Proceed with palliative care and hospice services - Await MRI results to evaluate potential for radiation therapy to the right hip area - Consider nerve block for right hip pain if MRI indicates appropriateness - Discontinue chemotherapy  Stage IIIB non-small cell lung cancer, squamous cell carcinoma Diagnosed in February 2024. Disease progression in the lungs. Management is focused on palliative care.  Pain management Significant right hip/groin pain inadequately managed by current Percocet regimen. Potential need for long-acting pain management. Consider fentanyl patch or other long-acting pain medication after hospice evaluation. - Allow hospice to adjust pain management regimen as needed  Anemia of chronic disease Anemia persists with hemoglobin at 8.6 g/dL. Elevated ferritin at 430 ng/mL, consistent with inflammation. Normal iron levels; iron supplementation is not necessary. - Discontinue iron supplements  Goals of Care Opted for palliative care and hospice services due to aggressive cancer and limited treatment options. Prognosis is less than three months. She and her family are realistic about the prognosis and have decided to focus on comfort and quality of life. Hospice care will manage symptoms and provide support. - Coordinate with hospice services in [facility] for comprehensive care - Ensure hospice team visits regularly and manages symptoms effectively  Follow-up Continued involvement in care despite transition to hospice. MRI results pending to assess right hip pain. - Review MRI results to determine need for radiation or nerve block - Ensure  hospice team evaluates and adjusts pain management regimen   We will see the patient on as-needed basis at this point.   The patient voices understanding of current disease status and treatment options and is in agreement with the current care  plan.  All questions were answered. The patient knows to call the clinic with any problems, questions or concerns. We can certainly see the patient much sooner if necessary.  The total time spent in the appointment was 30 minutes.  Disclaimer: This note was dictated with voice recognition software. Similar sounding words can inadvertently be transcribed and may not be corrected upon review.

## 2023-09-07 NOTE — Patient Instructions (Addendum)
-   pick up and start your fentanyl patch, place one patch on your upper arm and change it out every 3 days, this is long acting pain medication, it may take 72 hours before you can tell a difference  - when placing your patch make sure your arm is clean and dry, change the site that you use (switch between arms every other time you place a patch - we will place a referral of home palliative services through [facility]

## 2023-09-08 ENCOUNTER — Inpatient Hospital Stay

## 2023-09-10 ENCOUNTER — Telehealth: Payer: Self-pay

## 2023-09-10 NOTE — Telephone Encounter (Signed)
 Pt called reporting her pain is not controlled, per Lowella Bandy, NP, pt to add on another fentanyl patch, may take percocet 1-2 tabs every 4 hours and may increase gabapentin to 2 caps in the morning and afternoon and 3 capsules at bedtime. Pt educated on increased risk of constipation and drowsiness. Pt verbalized understanding of medication changes, no further needs at this time.

## 2023-09-11 ENCOUNTER — Telehealth: Payer: Self-pay

## 2023-09-11 NOTE — Telephone Encounter (Signed)
 Pt spoke with Lowella Bandy, NP and reviewed MRI results, pt opted for an orthopedic appt to see if she could get any pain relief from orthopedic interventions prior to enrolling in hospice services. Pt verbalized that her quality of life is most important to her at this time. RN called and appt made for 4/7 at 1pm with [facility], pt made aware of appt and agreeable to date and time. No further questions at this time. Pt has call back number

## 2023-09-14 ENCOUNTER — Ambulatory Visit: Payer: Medicare Other | Admitting: Internal Medicine

## 2023-09-14 ENCOUNTER — Other Ambulatory Visit: Payer: Medicare Other

## 2023-09-14 ENCOUNTER — Encounter (HOSPITAL_BASED_OUTPATIENT_CLINIC_OR_DEPARTMENT_OTHER): Payer: Self-pay | Admitting: Student

## 2023-09-14 ENCOUNTER — Ambulatory Visit: Payer: Medicare Other

## 2023-09-14 ENCOUNTER — Other Ambulatory Visit: Payer: Self-pay

## 2023-09-14 ENCOUNTER — Ambulatory Visit (HOSPITAL_BASED_OUTPATIENT_CLINIC_OR_DEPARTMENT_OTHER): Admitting: Student

## 2023-09-14 DIAGNOSIS — S73191A Other sprain of right hip, initial encounter: Secondary | ICD-10-CM

## 2023-09-14 DIAGNOSIS — Z515 Encounter for palliative care: Secondary | ICD-10-CM

## 2023-09-14 DIAGNOSIS — M1611 Unilateral primary osteoarthritis, right hip: Secondary | ICD-10-CM

## 2023-09-14 DIAGNOSIS — G893 Neoplasm related pain (acute) (chronic): Secondary | ICD-10-CM

## 2023-09-14 DIAGNOSIS — C3490 Malignant neoplasm of unspecified part of unspecified bronchus or lung: Secondary | ICD-10-CM

## 2023-09-14 MED ORDER — LIDOCAINE HCL 1 % IJ SOLN
4.0000 mL | INTRAMUSCULAR | Status: AC | PRN
  Administered 2023-09-14: 4 mL

## 2023-09-14 MED ORDER — TRIAMCINOLONE ACETONIDE 40 MG/ML IJ SUSP
2.0000 mL | INTRAMUSCULAR | Status: AC | PRN
  Administered 2023-09-14: 2 mL via INTRA_ARTICULAR

## 2023-09-14 NOTE — Progress Notes (Signed)
 Chief Complaint: Right hip pain     History of Present Illness:    Evelyn Jordan is a pleasant 73 y.o. female with history significant for stage IV metastatic bladder cancer presenting today for evaluation of right hip pain.  Patient reports that this has been ongoing for the last 3 or 4 months, noticeably increasing after undergoing a nephrectomy on 12/16.  Recent MRI suggested an acetabular labrum tear as well as hip arthritis.  Pain is located in the groin area and occasionally radiates down the anterior thigh.  Pain is increased significantly with walking, and this is worsening to the point where she is in pain after taking just more than a few steps.  She has trialed physical therapy, ibuprofen, Tylenol, and Percocet without any long-lasting relief.  Pain is described as sharp and often become severe.  Patient is preparing to transition into hospice care due to her cancer.   Surgical History:   None of the right hip  PMH/PSH/Family History/Social History/Meds/Allergies:    Past Medical History:  Diagnosis Date   Depression    Fatty liver    Insomnia    Lung cancer (HCC) 07/25/2022   Osteoporosis    Pericardial effusion 03/05/2023   small noted on ECHO   Pulmonary nodule    Thyroid nodule    Past Surgical History:  Procedure Laterality Date   ANKLE SURGERY Right    BREAST BIOPSY Right    benign 2013   BRONCHIAL BIOPSY  08/04/2022   Procedure: BRONCHIAL BIOPSIES;  Surgeon: Leslye Peer, MD;  Location[facility] ENDOSCOPY;  Service: Cardiopulmonary;;   BRONCHIAL BIOPSY  06/22/2023   Procedure: BRONCHIAL BIOPSIES;  Surgeon: Leslye Peer, MD;  Location: [facility] ENDOSCOPY;  Service: Pulmonary;;   BRONCHIAL BRUSHINGS  08/04/2022   Procedure: BRONCHIAL BRUSHINGS;  Surgeon: Leslye Peer, MD;  Location: [facility] ENDOSCOPY;  Service: Cardiopulmonary;;   BRONCHIAL BRUSHINGS  06/22/2023   Procedure: BRONCHIAL BRUSHINGS;  Surgeon: Leslye Peer, MD;   Location: [facility] ENDOSCOPY;  Service: Pulmonary;;   BRONCHIAL NEEDLE ASPIRATION BIOPSY  06/22/2023   Procedure: BRONCHIAL NEEDLE ASPIRATION BIOPSIES;  Surgeon: Leslye Peer, MD;  Location: [facility] ENDOSCOPY;  Service: Pulmonary;;   BRONCHIAL WASHINGS  06/22/2023   Procedure: BRONCHIAL WASHINGS;  Surgeon: Leslye Peer, MD;  Location: [facility] ENDOSCOPY;  Service: Pulmonary;;   COLONOSCOPY     CYSTOSCOPY WITH BIOPSY N/A 05/15/2023   Procedure: CYSTOSCOPY WITH RIGHT RENAL PELVIS BIOPSY;  Surgeon: Rene Paci, MD;  Location: [facility] ORS;  Service: Urology;  Laterality: N/A;   CYSTOSCOPY WITH STENT PLACEMENT Right 05/26/2023   Procedure: CYSTOSCOPY WITH RIGHT STENT PLACEMENT; TUR OF RIGHT URETERAL ORISICE;  Surgeon: Rene Paci, MD;  Location: [facility] ORS;  Service: Urology;  Laterality: Right;   CYSTOSCOPY/RETROGRADE/URETEROSCOPY N/A 05/15/2023   Procedure: CYSTOSCOPY, RIGHT RETROGRADE, RIGHT URETEROSCOPY, RIGHT STENT PLACEMENT;  Surgeon: Rene Paci, MD;  Location: [facility] ORS;  Service: Urology;  Laterality: N/A;  30 MINUTES   HEMOSTASIS CONTROL  08/04/2022   Procedure: HEMOSTASIS CONTROL;  Surgeon: Leslye Peer, MD;  Location: [facility] ENDOSCOPY;  Service: Cardiopulmonary;;   IR IMAGING GUIDED PORT INSERTION  09/04/2022   repair of a torn meniscus Left 05/2018   ROBOT ASSITED LAPAROSCOPIC NEPHROURETERECTOMY Right 05/26/2023   Procedure: XI ROBOT ASSITED LAPAROSCOPIC NEPHROURETERECTOMY;  Surgeon: Winter,  Dorian Furnace, MD;  Location: [facility] ORS;  Service: Urology;  Laterality: Right;  210 MINS FOR CASE   VIDEO BRONCHOSCOPY Left 08/04/2022   Procedure: VIDEO BRONCHOSCOPY WITH FLUORO;  Surgeon: Leslye Peer, MD;  Location: [facility] ENDOSCOPY;  Service: Cardiopulmonary;  Laterality: Left;   Social History   Socioeconomic History   Marital status: Married    Spouse name: Archie   Number of children: Not on file   Years of education: Not on file   Highest education level: Bachelor's  degree (e.g., BA, AB, BS)  Occupational History    Comment: retired Engineer, civil (consulting)   Tobacco Use   Smoking status: Former    Current packs/day: 0.00    Average packs/day: 1 pack/day for 41.0 years (41.0 ttl pk-yrs)    Types: Cigarettes    Start date: 89    Quit date: 2012    Years since quitting: 13.2   Smokeless tobacco: Never  Vaping Use   Vaping status: Never Used  Substance and Sexual Activity   Alcohol use: Yes    Comment: Rarely   Drug use: No   Sexual activity: Not on file  Other Topics Concern   Not on file  Social History Narrative   Not on file   Social Drivers of Health   Financial Resource Strain: Low Risk  (04/29/2023)   Overall Financial Resource Strain (CARDIA)    Difficulty of Paying Living Expenses: Not hard at all  Food Insecurity: No Food Insecurity (06/23/2023)   Hunger Vital Sign    Worried About Running Out of Food in the Last Year: Never true    Ran Out of Food in the Last Year: Never true  Transportation Needs: No Transportation Needs (06/23/2023)   PRAPARE - Administrator, Civil Service (Medical): No    Lack of Transportation (Non-Medical): No  Physical Activity: Sufficiently Active (04/29/2023)   Exercise Vital Sign    Days of Exercise per Week: 4 days    Minutes of Exercise per Session: 40 min  Stress: No Stress Concern Present (04/29/2023)   [employer] of Occupational Health - Occupational Stress Questionnaire    Feeling of Stress : Only a little  Social Connections: Unknown (04/29/2023)   Social Connection and Isolation Panel [NHANES]    Frequency of Communication with Friends and Family: Once a week    Frequency of Social Gatherings with Friends and Family: Once a week    Attends Religious Services: Patient declined    Database administrator or Organizations: No    Attends Engineer, structural: Never    Marital Status: Married   Family History  Problem Relation Age of Onset   Alzheimer's disease Mother     Hypertension Father    Stroke Father    Hyperlipidemia Brother    Breast cancer Neg Hx    Allergies  Allergen Reactions   Emend [Fosaprepitant Dimeglumine] Other (See Comments)    Chest tightness with first Emend reaction.  See Progress Note 06/16/23.   Compazine [Prochlorperazine Edisylate] Other (See Comments)    "Neck hyperextends"   Current Outpatient Medications  Medication Sig Dispense Refill   acetaminophen (TYLENOL) 325 MG tablet Take 2 tablets (650 mg total) by mouth every 6 (six) hours as needed for mild pain (pain score 1-3) (or Fever >/= 101).     bisacodyl (DULCOLAX) 5 MG EC tablet Take 10-15 mg by mouth daily as needed for moderate constipation.     fentaNYL (DURAGESIC) 12 MCG/HR Place 1  patch onto the skin every 3 (three) days. 10 patch 0   gabapentin (NEURONTIN) 300 MG capsule Take one capsule (300mg ) every morning and 2 capsules (600mg ) at bedtime. 90 capsule 0   Iron, Ferrous Sulfate, 325 (65 Fe) MG TABS Take 65 mg by mouth daily with breakfast.     levothyroxine (SYNTHROID) 100 MCG tablet Take 1 tablet (100 mcg total) by mouth daily before breakfast. 90 tablet 3   ondansetron (ZOFRAN) 8 MG tablet Take 1 tablet (8 mg total) by mouth every 8 (eight) hours as needed for nausea or vomiting. Start after day 3 of chemotherapy if needed 30 tablet 2   oxyCODONE-acetaminophen (PERCOCET/ROXICET) 5-325 MG tablet Take 1 tablet by mouth every 6 (six) hours as needed for severe pain (pain score 7-10). 60 tablet 0   senna-docusate (SENNA S) 8.6-50 MG tablet Take 2 tablets by mouth 2 (two) times daily. 120 tablet 6   zolpidem (AMBIEN) 10 MG tablet Take 0.5-1 tablets (5-10 mg total) by mouth at bedtime as needed for sleep. 30 tablet 1   No current facility-administered medications for this visit.   No results found.  Review of Systems:   A ROS was performed including pertinent positives and negatives as documented in the HPI.  Physical Exam :   Constitutional: NAD and appears stated  age Neurological: Alert and oriented Psych: Appropriate affect and cooperative There were no vitals taken for this visit.   Comprehensive Musculoskeletal Exam:    Passive range of motion of the right hip to 120 degrees flexion and 20 degrees of internal and external rotation.  Positive FADIR.  No overlying erythema or warmth.  Distal neurosensory exam intact.  Imaging:   MRI review right hip from 08/29/2023: Moderate degenerative changes involving the right hip joint.  Subacute posterior superior labral tear.   I personally reviewed and interpreted the radiographs.   Assessment:   73 y.o. female with end-stage metastatic cancer presenting with persistent right hip pain.  Recent MRI does show evidence of a labral tear as well as moderate osteoarthritis within the joint.  This does appear consistent with her current symptoms and she has failed conservative management thus far with PT and medication.  Patient is set to transfer into hospice care due to her cancer.  Given her symptoms and findings on imaging, I have offered a joint injection of the right hip today for symptomatic relief.  Patient is agreeable to this and injection was performed under ultrasound guidance of the right hip joint without complication.  Can plan to follow-up as needed.  Plan :    -Cortisone injection performed of the right hip today -Return to clinic as needed     Procedure Note  Patient: Evelyn Jordan             Date of Birth: 1951-04-01           MRN: [id number]             Visit Date: 09/14/2023  Procedures: Visit Diagnoses:  1. Unilateral primary osteoarthritis, right hip   2. Tear of right acetabular labrum, initial encounter     Large Joint Inj: R hip joint on 09/14/2023 1:45 PM Indications: pain Details: 22 G 3.5 in needle, anterior approach Medications: 4 mL lidocaine 1 %; 2 mL triamcinolone acetonide 40 MG/ML Outcome: tolerated well, no immediate complications Procedure, treatment  alternatives, risks and benefits explained, specific risks discussed. Consent was given by the patient. Immediately prior to procedure a time out was  called to verify the correct patient, procedure, equipment, support staff and site/side marked as required. Patient was prepped and draped in the usual sterile fashion.       I personally saw and evaluated the patient, and participated in the management and treatment plan.  Hazle Nordmann, PA-C Orthopedics

## 2023-09-14 NOTE — Progress Notes (Signed)
 Orders for IVF for 09/15/23 placed under signed and held

## 2023-09-15 ENCOUNTER — Other Ambulatory Visit

## 2023-09-15 ENCOUNTER — Other Ambulatory Visit (HOSPITAL_COMMUNITY): Payer: Self-pay

## 2023-09-15 ENCOUNTER — Inpatient Hospital Stay: Attending: Internal Medicine

## 2023-09-15 ENCOUNTER — Other Ambulatory Visit: Payer: Self-pay | Admitting: Nurse Practitioner

## 2023-09-15 ENCOUNTER — Ambulatory Visit

## 2023-09-15 ENCOUNTER — Other Ambulatory Visit: Payer: Self-pay

## 2023-09-15 ENCOUNTER — Encounter: Payer: Self-pay | Admitting: Internal Medicine

## 2023-09-15 DIAGNOSIS — Z515 Encounter for palliative care: Secondary | ICD-10-CM

## 2023-09-15 DIAGNOSIS — C3490 Malignant neoplasm of unspecified part of unspecified bronchus or lung: Secondary | ICD-10-CM

## 2023-09-15 DIAGNOSIS — C3412 Malignant neoplasm of upper lobe, left bronchus or lung: Secondary | ICD-10-CM | POA: Insufficient documentation

## 2023-09-15 DIAGNOSIS — G893 Neoplasm related pain (acute) (chronic): Secondary | ICD-10-CM

## 2023-09-15 MED ORDER — FENTANYL 25 MCG/HR TD PT72: 1.0000 | MEDICATED_PATCH | TRANSDERMAL | 0 refills | Status: DC

## 2023-09-15 MED ORDER — FENTANYL 12 MCG/HR TD PT72: 1.0000 | MEDICATED_PATCH | TRANSDERMAL | 0 refills | Status: DC

## 2023-09-15 MED ORDER — FENTANYL 25 MCG/HR TD PT72
1.0000 | MEDICATED_PATCH | TRANSDERMAL | 0 refills | Status: DC
  Filled 2023-09-16: qty 10, 30d supply, fill #0

## 2023-09-15 MED ORDER — SODIUM CHLORIDE 0.9 % IV SOLN: INTRAVENOUS | Status: DC

## 2023-09-15 MED ORDER — FENTANYL 12 MCG/HR TD PT72
1.0000 | MEDICATED_PATCH | TRANSDERMAL | 0 refills | Status: DC
  Filled 2023-09-17: qty 10, 30d supply, fill #0

## 2023-09-15 NOTE — Patient Instructions (Signed)

## 2023-09-16 ENCOUNTER — Other Ambulatory Visit (HOSPITAL_COMMUNITY): Payer: Self-pay

## 2023-09-16 ENCOUNTER — Encounter: Payer: Self-pay | Admitting: Internal Medicine

## 2023-09-16 ENCOUNTER — Ambulatory Visit (INDEPENDENT_AMBULATORY_CARE_PROVIDER_SITE_OTHER): Admitting: Orthopaedic Surgery

## 2023-09-16 ENCOUNTER — Other Ambulatory Visit: Payer: Self-pay

## 2023-09-16 ENCOUNTER — Other Ambulatory Visit: Payer: Self-pay | Admitting: Nurse Practitioner

## 2023-09-16 DIAGNOSIS — M1611 Unilateral primary osteoarthritis, right hip: Secondary | ICD-10-CM

## 2023-09-16 DIAGNOSIS — C3490 Malignant neoplasm of unspecified part of unspecified bronchus or lung: Secondary | ICD-10-CM

## 2023-09-16 DIAGNOSIS — M25551 Pain in right hip: Secondary | ICD-10-CM | POA: Diagnosis not present

## 2023-09-16 DIAGNOSIS — R110 Nausea: Secondary | ICD-10-CM

## 2023-09-16 MED ORDER — MELOXICAM 15 MG PO TABS: 15.0000 mg | ORAL_TABLET | Freq: Every day | ORAL | 3 refills | Status: DC | PRN

## 2023-09-16 MED ORDER — ONDANSETRON HCL 8 MG PO TABS
8.0000 mg | ORAL_TABLET | Freq: Three times a day (TID) | ORAL | 3 refills | Status: DC | PRN
  Filled 2023-09-16: qty 30, 10d supply, fill #0

## 2023-09-16 NOTE — Progress Notes (Signed)
 The patient is a very pleasant 73 year old female sent to me unfortunately due to a labral tear and significant arthritis of her right hip.  She did not have right hip issues but then had a robotic nephrectomy several months ago and this really irritated her right hip.  Based on her MRI I believe she likely had a degenerative tear already but this is really asymptomatic for her.  She does have arthritis in her left hip that I can see on the MRI but she is asymptomatic.  She is on significant narcotics as a relates to stage IV cancer and she has only a 90-day prognosis.  Just 2 days ago she did have a steroid injection under ultrasound in her right hip joint.  We had a long and thorough discussion about this.  She does sit with her legs crossed because she says is more comfortable when she does flex her right hip.  I do feel that she should try meloxicam on an intermittent basis given her poor prognosis and the fact that she has only 1 kidney.  I think that a little bit of anti-inflammatories could certainly help calm down the inflammatory component of her pain.  I have also recommended a cane or opposite hand to offload that right hip.  She was using her left hand.  From my standpoint I would not hesitate for her to see Jean Rosenthal again in 6 to 8 weeks for an earlier steroid injection in the right hip if this does help temporize her pain.  She understands that the steroid junction still make take several days before it helps.  She agrees with this treatment plan.

## 2023-09-17 ENCOUNTER — Other Ambulatory Visit (HOSPITAL_COMMUNITY): Payer: Self-pay

## 2023-09-21 ENCOUNTER — Other Ambulatory Visit: Payer: Self-pay | Admitting: Nurse Practitioner

## 2023-09-21 ENCOUNTER — Ambulatory Visit: Payer: Medicare Other

## 2023-09-21 ENCOUNTER — Other Ambulatory Visit: Payer: Self-pay

## 2023-09-21 ENCOUNTER — Inpatient Hospital Stay: Payer: Medicare Other

## 2023-09-21 ENCOUNTER — Other Ambulatory Visit: Payer: Medicare Other

## 2023-09-21 ENCOUNTER — Telehealth: Payer: Self-pay

## 2023-09-21 DIAGNOSIS — C679 Malignant neoplasm of bladder, unspecified: Secondary | ICD-10-CM

## 2023-09-21 DIAGNOSIS — C3490 Malignant neoplasm of unspecified part of unspecified bronchus or lung: Secondary | ICD-10-CM

## 2023-09-21 DIAGNOSIS — Z515 Encounter for palliative care: Secondary | ICD-10-CM

## 2023-09-21 DIAGNOSIS — G893 Neoplasm related pain (acute) (chronic): Secondary | ICD-10-CM

## 2023-09-21 DIAGNOSIS — C3492 Malignant neoplasm of unspecified part of left bronchus or lung: Secondary | ICD-10-CM

## 2023-09-21 MED ORDER — OXYCODONE-ACETAMINOPHEN 5-325 MG PO TABS
1.0000 | ORAL_TABLET | Freq: Four times a day (QID) | ORAL | 0 refills | Status: DC | PRN
Start: 2023-09-21 — End: 2023-11-17

## 2023-09-21 NOTE — Telephone Encounter (Signed)
 [facility] [place] [facility] fax received, verified with Bridgette Campus at hospice. No further needs at this time.

## 2023-09-21 NOTE — Progress Notes (Signed)
 Orders placed for hospice per pt request.

## 2023-09-22 DIAGNOSIS — C3490 Malignant neoplasm of unspecified part of unspecified bronchus or lung: Secondary | ICD-10-CM | POA: Diagnosis not present

## 2023-09-22 DIAGNOSIS — Z681 Body mass index (BMI) 19 or less, adult: Secondary | ICD-10-CM | POA: Diagnosis not present

## 2023-09-22 DIAGNOSIS — Z87891 Personal history of nicotine dependence: Secondary | ICD-10-CM | POA: Diagnosis not present

## 2023-09-22 DIAGNOSIS — Z905 Acquired absence of kidney: Secondary | ICD-10-CM | POA: Diagnosis not present

## 2023-09-22 DIAGNOSIS — E079 Disorder of thyroid, unspecified: Secondary | ICD-10-CM | POA: Diagnosis not present

## 2023-09-22 DIAGNOSIS — C679 Malignant neoplasm of bladder, unspecified: Secondary | ICD-10-CM | POA: Diagnosis not present

## 2023-09-22 DIAGNOSIS — C7951 Secondary malignant neoplasm of bone: Secondary | ICD-10-CM | POA: Diagnosis not present

## 2023-09-22 DIAGNOSIS — I3139 Other pericardial effusion (noninflammatory): Secondary | ICD-10-CM | POA: Diagnosis not present

## 2023-09-24 DIAGNOSIS — C679 Malignant neoplasm of bladder, unspecified: Secondary | ICD-10-CM | POA: Diagnosis not present

## 2023-09-24 DIAGNOSIS — C3490 Malignant neoplasm of unspecified part of unspecified bronchus or lung: Secondary | ICD-10-CM | POA: Diagnosis not present

## 2023-09-24 DIAGNOSIS — C7951 Secondary malignant neoplasm of bone: Secondary | ICD-10-CM | POA: Diagnosis not present

## 2023-09-24 DIAGNOSIS — Z87891 Personal history of nicotine dependence: Secondary | ICD-10-CM | POA: Diagnosis not present

## 2023-09-24 DIAGNOSIS — E079 Disorder of thyroid, unspecified: Secondary | ICD-10-CM | POA: Diagnosis not present

## 2023-09-24 DIAGNOSIS — I3139 Other pericardial effusion (noninflammatory): Secondary | ICD-10-CM | POA: Diagnosis not present

## 2023-09-28 ENCOUNTER — Ambulatory Visit: Admitting: Internal Medicine

## 2023-09-28 ENCOUNTER — Other Ambulatory Visit

## 2023-09-28 ENCOUNTER — Ambulatory Visit

## 2023-09-29 DIAGNOSIS — C7951 Secondary malignant neoplasm of bone: Secondary | ICD-10-CM | POA: Diagnosis not present

## 2023-09-29 DIAGNOSIS — C679 Malignant neoplasm of bladder, unspecified: Secondary | ICD-10-CM | POA: Diagnosis not present

## 2023-09-29 DIAGNOSIS — I3139 Other pericardial effusion (noninflammatory): Secondary | ICD-10-CM | POA: Diagnosis not present

## 2023-09-29 DIAGNOSIS — E079 Disorder of thyroid, unspecified: Secondary | ICD-10-CM | POA: Diagnosis not present

## 2023-09-29 DIAGNOSIS — C3490 Malignant neoplasm of unspecified part of unspecified bronchus or lung: Secondary | ICD-10-CM | POA: Diagnosis not present

## 2023-09-29 DIAGNOSIS — Z87891 Personal history of nicotine dependence: Secondary | ICD-10-CM | POA: Diagnosis not present

## 2023-09-30 DIAGNOSIS — C7951 Secondary malignant neoplasm of bone: Secondary | ICD-10-CM | POA: Diagnosis not present

## 2023-09-30 DIAGNOSIS — I3139 Other pericardial effusion (noninflammatory): Secondary | ICD-10-CM | POA: Diagnosis not present

## 2023-09-30 DIAGNOSIS — Z87891 Personal history of nicotine dependence: Secondary | ICD-10-CM | POA: Diagnosis not present

## 2023-09-30 DIAGNOSIS — E079 Disorder of thyroid, unspecified: Secondary | ICD-10-CM | POA: Diagnosis not present

## 2023-09-30 DIAGNOSIS — C679 Malignant neoplasm of bladder, unspecified: Secondary | ICD-10-CM | POA: Diagnosis not present

## 2023-09-30 DIAGNOSIS — C3490 Malignant neoplasm of unspecified part of unspecified bronchus or lung: Secondary | ICD-10-CM | POA: Diagnosis not present

## 2023-10-05 ENCOUNTER — Other Ambulatory Visit

## 2023-10-05 ENCOUNTER — Ambulatory Visit

## 2023-10-05 ENCOUNTER — Ambulatory Visit: Payer: Medicare Other | Admitting: Physician Assistant

## 2023-10-05 ENCOUNTER — Ambulatory Visit: Payer: Medicare Other

## 2023-10-05 ENCOUNTER — Other Ambulatory Visit: Payer: Medicare Other

## 2023-10-05 DIAGNOSIS — C679 Malignant neoplasm of bladder, unspecified: Secondary | ICD-10-CM | POA: Diagnosis not present

## 2023-10-05 DIAGNOSIS — Z87891 Personal history of nicotine dependence: Secondary | ICD-10-CM | POA: Diagnosis not present

## 2023-10-05 DIAGNOSIS — I3139 Other pericardial effusion (noninflammatory): Secondary | ICD-10-CM | POA: Diagnosis not present

## 2023-10-05 DIAGNOSIS — C3490 Malignant neoplasm of unspecified part of unspecified bronchus or lung: Secondary | ICD-10-CM | POA: Diagnosis not present

## 2023-10-05 DIAGNOSIS — E079 Disorder of thyroid, unspecified: Secondary | ICD-10-CM | POA: Diagnosis not present

## 2023-10-05 DIAGNOSIS — C7951 Secondary malignant neoplasm of bone: Secondary | ICD-10-CM | POA: Diagnosis not present

## 2023-10-07 DIAGNOSIS — Z87891 Personal history of nicotine dependence: Secondary | ICD-10-CM | POA: Diagnosis not present

## 2023-10-07 DIAGNOSIS — E079 Disorder of thyroid, unspecified: Secondary | ICD-10-CM | POA: Diagnosis not present

## 2023-10-07 DIAGNOSIS — C679 Malignant neoplasm of bladder, unspecified: Secondary | ICD-10-CM | POA: Diagnosis not present

## 2023-10-07 DIAGNOSIS — C3490 Malignant neoplasm of unspecified part of unspecified bronchus or lung: Secondary | ICD-10-CM | POA: Diagnosis not present

## 2023-10-07 DIAGNOSIS — C7951 Secondary malignant neoplasm of bone: Secondary | ICD-10-CM | POA: Diagnosis not present

## 2023-10-07 DIAGNOSIS — I3139 Other pericardial effusion (noninflammatory): Secondary | ICD-10-CM | POA: Diagnosis not present

## 2023-10-08 DIAGNOSIS — I3139 Other pericardial effusion (noninflammatory): Secondary | ICD-10-CM | POA: Diagnosis not present

## 2023-10-08 DIAGNOSIS — E079 Disorder of thyroid, unspecified: Secondary | ICD-10-CM | POA: Diagnosis not present

## 2023-10-08 DIAGNOSIS — Z905 Acquired absence of kidney: Secondary | ICD-10-CM | POA: Diagnosis not present

## 2023-10-08 DIAGNOSIS — C3490 Malignant neoplasm of unspecified part of unspecified bronchus or lung: Secondary | ICD-10-CM | POA: Diagnosis not present

## 2023-10-08 DIAGNOSIS — C7951 Secondary malignant neoplasm of bone: Secondary | ICD-10-CM | POA: Diagnosis not present

## 2023-10-08 DIAGNOSIS — Z87891 Personal history of nicotine dependence: Secondary | ICD-10-CM | POA: Diagnosis not present

## 2023-10-08 DIAGNOSIS — C679 Malignant neoplasm of bladder, unspecified: Secondary | ICD-10-CM | POA: Diagnosis not present

## 2023-10-08 DIAGNOSIS — I2510 Atherosclerotic heart disease of native coronary artery without angina pectoris: Secondary | ICD-10-CM | POA: Diagnosis not present

## 2023-10-08 DIAGNOSIS — Z681 Body mass index (BMI) 19 or less, adult: Secondary | ICD-10-CM | POA: Diagnosis not present

## 2023-10-08 DIAGNOSIS — M1990 Unspecified osteoarthritis, unspecified site: Secondary | ICD-10-CM | POA: Diagnosis not present

## 2023-10-12 ENCOUNTER — Other Ambulatory Visit: Payer: Medicare Other

## 2023-10-12 ENCOUNTER — Ambulatory Visit: Payer: Medicare Other

## 2023-10-12 DIAGNOSIS — E079 Disorder of thyroid, unspecified: Secondary | ICD-10-CM | POA: Diagnosis not present

## 2023-10-12 DIAGNOSIS — C3490 Malignant neoplasm of unspecified part of unspecified bronchus or lung: Secondary | ICD-10-CM | POA: Diagnosis not present

## 2023-10-12 DIAGNOSIS — I3139 Other pericardial effusion (noninflammatory): Secondary | ICD-10-CM | POA: Diagnosis not present

## 2023-10-12 DIAGNOSIS — Z87891 Personal history of nicotine dependence: Secondary | ICD-10-CM | POA: Diagnosis not present

## 2023-10-12 DIAGNOSIS — C679 Malignant neoplasm of bladder, unspecified: Secondary | ICD-10-CM | POA: Diagnosis not present

## 2023-10-12 DIAGNOSIS — C7951 Secondary malignant neoplasm of bone: Secondary | ICD-10-CM | POA: Diagnosis not present

## 2023-10-13 DIAGNOSIS — C679 Malignant neoplasm of bladder, unspecified: Secondary | ICD-10-CM | POA: Diagnosis not present

## 2023-10-13 DIAGNOSIS — Z87891 Personal history of nicotine dependence: Secondary | ICD-10-CM | POA: Diagnosis not present

## 2023-10-13 DIAGNOSIS — C7951 Secondary malignant neoplasm of bone: Secondary | ICD-10-CM | POA: Diagnosis not present

## 2023-10-13 DIAGNOSIS — E079 Disorder of thyroid, unspecified: Secondary | ICD-10-CM | POA: Diagnosis not present

## 2023-10-13 DIAGNOSIS — I3139 Other pericardial effusion (noninflammatory): Secondary | ICD-10-CM | POA: Diagnosis not present

## 2023-10-13 DIAGNOSIS — C3490 Malignant neoplasm of unspecified part of unspecified bronchus or lung: Secondary | ICD-10-CM | POA: Diagnosis not present

## 2023-10-15 DIAGNOSIS — I3139 Other pericardial effusion (noninflammatory): Secondary | ICD-10-CM | POA: Diagnosis not present

## 2023-10-15 DIAGNOSIS — C3490 Malignant neoplasm of unspecified part of unspecified bronchus or lung: Secondary | ICD-10-CM | POA: Diagnosis not present

## 2023-10-15 DIAGNOSIS — C7951 Secondary malignant neoplasm of bone: Secondary | ICD-10-CM | POA: Diagnosis not present

## 2023-10-15 DIAGNOSIS — C679 Malignant neoplasm of bladder, unspecified: Secondary | ICD-10-CM | POA: Diagnosis not present

## 2023-10-15 DIAGNOSIS — Z87891 Personal history of nicotine dependence: Secondary | ICD-10-CM | POA: Diagnosis not present

## 2023-10-15 DIAGNOSIS — E079 Disorder of thyroid, unspecified: Secondary | ICD-10-CM | POA: Diagnosis not present

## 2023-10-19 ENCOUNTER — Other Ambulatory Visit

## 2023-10-19 ENCOUNTER — Ambulatory Visit

## 2023-10-19 ENCOUNTER — Ambulatory Visit: Admitting: Internal Medicine

## 2023-10-19 DIAGNOSIS — E079 Disorder of thyroid, unspecified: Secondary | ICD-10-CM | POA: Diagnosis not present

## 2023-10-19 DIAGNOSIS — I3139 Other pericardial effusion (noninflammatory): Secondary | ICD-10-CM | POA: Diagnosis not present

## 2023-10-19 DIAGNOSIS — C3490 Malignant neoplasm of unspecified part of unspecified bronchus or lung: Secondary | ICD-10-CM | POA: Diagnosis not present

## 2023-10-19 DIAGNOSIS — Z87891 Personal history of nicotine dependence: Secondary | ICD-10-CM | POA: Diagnosis not present

## 2023-10-19 DIAGNOSIS — C679 Malignant neoplasm of bladder, unspecified: Secondary | ICD-10-CM | POA: Diagnosis not present

## 2023-10-19 DIAGNOSIS — C7951 Secondary malignant neoplasm of bone: Secondary | ICD-10-CM | POA: Diagnosis not present

## 2023-10-22 DIAGNOSIS — Z87891 Personal history of nicotine dependence: Secondary | ICD-10-CM | POA: Diagnosis not present

## 2023-10-22 DIAGNOSIS — C3490 Malignant neoplasm of unspecified part of unspecified bronchus or lung: Secondary | ICD-10-CM | POA: Diagnosis not present

## 2023-10-22 DIAGNOSIS — C7951 Secondary malignant neoplasm of bone: Secondary | ICD-10-CM | POA: Diagnosis not present

## 2023-10-22 DIAGNOSIS — E079 Disorder of thyroid, unspecified: Secondary | ICD-10-CM | POA: Diagnosis not present

## 2023-10-22 DIAGNOSIS — C679 Malignant neoplasm of bladder, unspecified: Secondary | ICD-10-CM | POA: Diagnosis not present

## 2023-10-22 DIAGNOSIS — I3139 Other pericardial effusion (noninflammatory): Secondary | ICD-10-CM | POA: Diagnosis not present

## 2023-10-26 ENCOUNTER — Ambulatory Visit

## 2023-10-26 ENCOUNTER — Other Ambulatory Visit

## 2023-10-26 DIAGNOSIS — C679 Malignant neoplasm of bladder, unspecified: Secondary | ICD-10-CM | POA: Diagnosis not present

## 2023-10-26 DIAGNOSIS — Z87891 Personal history of nicotine dependence: Secondary | ICD-10-CM | POA: Diagnosis not present

## 2023-10-26 DIAGNOSIS — E079 Disorder of thyroid, unspecified: Secondary | ICD-10-CM | POA: Diagnosis not present

## 2023-10-26 DIAGNOSIS — C3490 Malignant neoplasm of unspecified part of unspecified bronchus or lung: Secondary | ICD-10-CM | POA: Diagnosis not present

## 2023-10-26 DIAGNOSIS — I3139 Other pericardial effusion (noninflammatory): Secondary | ICD-10-CM | POA: Diagnosis not present

## 2023-10-26 DIAGNOSIS — C7951 Secondary malignant neoplasm of bone: Secondary | ICD-10-CM | POA: Diagnosis not present

## 2023-10-27 ENCOUNTER — Ambulatory Visit: Payer: Medicare Other

## 2023-10-27 DIAGNOSIS — C679 Malignant neoplasm of bladder, unspecified: Secondary | ICD-10-CM | POA: Diagnosis not present

## 2023-10-27 DIAGNOSIS — C7951 Secondary malignant neoplasm of bone: Secondary | ICD-10-CM | POA: Diagnosis not present

## 2023-10-27 DIAGNOSIS — E079 Disorder of thyroid, unspecified: Secondary | ICD-10-CM | POA: Diagnosis not present

## 2023-10-27 DIAGNOSIS — I3139 Other pericardial effusion (noninflammatory): Secondary | ICD-10-CM | POA: Diagnosis not present

## 2023-10-27 DIAGNOSIS — Z87891 Personal history of nicotine dependence: Secondary | ICD-10-CM | POA: Diagnosis not present

## 2023-10-27 DIAGNOSIS — C3490 Malignant neoplasm of unspecified part of unspecified bronchus or lung: Secondary | ICD-10-CM | POA: Diagnosis not present

## 2023-10-28 ENCOUNTER — Telehealth: Payer: Self-pay | Admitting: Family Medicine

## 2023-10-28 DIAGNOSIS — C7951 Secondary malignant neoplasm of bone: Secondary | ICD-10-CM | POA: Diagnosis not present

## 2023-10-28 DIAGNOSIS — E079 Disorder of thyroid, unspecified: Secondary | ICD-10-CM | POA: Diagnosis not present

## 2023-10-28 DIAGNOSIS — C679 Malignant neoplasm of bladder, unspecified: Secondary | ICD-10-CM | POA: Diagnosis not present

## 2023-10-28 DIAGNOSIS — C3490 Malignant neoplasm of unspecified part of unspecified bronchus or lung: Secondary | ICD-10-CM | POA: Diagnosis not present

## 2023-10-28 DIAGNOSIS — I3139 Other pericardial effusion (noninflammatory): Secondary | ICD-10-CM | POA: Diagnosis not present

## 2023-10-28 DIAGNOSIS — Z87891 Personal history of nicotine dependence: Secondary | ICD-10-CM | POA: Diagnosis not present

## 2023-11-08 NOTE — Telephone Encounter (Signed)
 Called to confirm [facility] appt and was told by patients daughter that patient has passed away this morning 11/14/23.    Thank you [facility] TEAM Direct Dial [phone]

## 2023-11-08 DEATH — deceased
